# Patient Record
Sex: Male | Born: 1935 | Race: White | Hispanic: No | Marital: Married | Smoking: Never smoker
Health system: Southern US, Community
[De-identification: ages and names within clinical notes are randomized; demographics above are authoritative.]

## PROBLEM LIST (undated history)

## (undated) DIAGNOSIS — Z955 Presence of coronary angioplasty implant and graft: Secondary | ICD-10-CM

## (undated) DIAGNOSIS — I639 Cerebral infarction, unspecified: Secondary | ICD-10-CM

## (undated) DIAGNOSIS — I4891 Unspecified atrial fibrillation: Secondary | ICD-10-CM

## (undated) DIAGNOSIS — I10 Essential (primary) hypertension: Secondary | ICD-10-CM

## (undated) DIAGNOSIS — N189 Chronic kidney disease, unspecified: Secondary | ICD-10-CM

## (undated) DIAGNOSIS — N400 Enlarged prostate without lower urinary tract symptoms: Secondary | ICD-10-CM

## (undated) HISTORY — PX: COLECTOMY: SHX59

## (undated) HISTORY — DX: Cerebral infarction, unspecified: I63.9

---

## 2004-05-08 HISTORY — PX: COLECTOMY: SHX59

## 2019-09-04 ENCOUNTER — Encounter: Payer: Self-pay | Admitting: Speech Pathology

## 2019-09-04 LAB — BASIC METABOLIC PANEL
BUN: 37 — AB (ref 4–21)
CO2: 26 — AB (ref 13–22)
Chloride: 110 — AB (ref 99–108)
Creatinine: 2.4 — AB (ref 0.6–1.3)
Glucose: 125
Potassium: 4 (ref 3.4–5.3)
Sodium: 141 (ref 137–147)

## 2019-09-04 LAB — CBC AND DIFFERENTIAL
HCT: 37 — AB (ref 41–53)
Hemoglobin: 11.8 — AB (ref 13.5–17.5)
Platelets: 125 — AB (ref 150–399)
WBC: 5

## 2019-09-04 LAB — HEPATIC FUNCTION PANEL
ALT: 12 (ref 10–40)
AST: 9 — AB (ref 14–40)
Bilirubin, Direct: 0.2 (ref 0.01–0.4)
Bilirubin, Total: 0.6

## 2019-09-04 LAB — COMPREHENSIVE METABOLIC PANEL
Albumin: 2.4 — AB (ref 3.5–5.0)
Calcium: 8.4 — AB (ref 8.7–10.7)

## 2019-09-04 LAB — CBC: RBC: 3.9 (ref 3.87–5.11)

## 2019-09-04 LAB — VITAMIN D 25 HYDROXY (VIT D DEFICIENCY, FRACTURES): Vit D, 25-Hydroxy: 63.99

## 2019-09-08 ENCOUNTER — Other Ambulatory Visit: Payer: Self-pay

## 2019-09-08 ENCOUNTER — Ambulatory Visit: Payer: No Typology Code available for payment source | Attending: Physician Assistant

## 2019-09-08 DIAGNOSIS — R2689 Other abnormalities of gait and mobility: Secondary | ICD-10-CM | POA: Diagnosis present

## 2019-09-08 DIAGNOSIS — R209 Unspecified disturbances of skin sensation: Secondary | ICD-10-CM | POA: Diagnosis present

## 2019-09-08 DIAGNOSIS — R471 Dysarthria and anarthria: Secondary | ICD-10-CM | POA: Diagnosis present

## 2019-09-08 DIAGNOSIS — R414 Neurologic neglect syndrome: Secondary | ICD-10-CM | POA: Insufficient documentation

## 2019-09-08 DIAGNOSIS — R41841 Cognitive communication deficit: Secondary | ICD-10-CM | POA: Diagnosis present

## 2019-09-08 DIAGNOSIS — M25611 Stiffness of right shoulder, not elsewhere classified: Secondary | ICD-10-CM | POA: Insufficient documentation

## 2019-09-08 DIAGNOSIS — I69351 Hemiplegia and hemiparesis following cerebral infarction affecting right dominant side: Secondary | ICD-10-CM | POA: Insufficient documentation

## 2019-09-08 DIAGNOSIS — R1310 Dysphagia, unspecified: Secondary | ICD-10-CM | POA: Insufficient documentation

## 2019-09-08 DIAGNOSIS — M6281 Muscle weakness (generalized): Secondary | ICD-10-CM | POA: Insufficient documentation

## 2019-09-08 DIAGNOSIS — R41840 Attention and concentration deficit: Secondary | ICD-10-CM | POA: Diagnosis present

## 2019-09-08 DIAGNOSIS — R2681 Unsteadiness on feet: Secondary | ICD-10-CM | POA: Insufficient documentation

## 2019-09-08 DIAGNOSIS — R600 Localized edema: Secondary | ICD-10-CM | POA: Insufficient documentation

## 2019-09-08 DIAGNOSIS — R4701 Aphasia: Secondary | ICD-10-CM | POA: Diagnosis present

## 2019-09-08 NOTE — Therapy (Signed)
[facility] [address] [place], [place], [address] Phone: [phone]   Fax:  [phone]  Physical Therapy Treatment  Patient Details  Name: Robert Mcconnell MRN: [id number] Date of Birth: [birth year] Referring Provider (PT): Arnell Sieving   Encounter Date: 09/08/2019    Past Medical History:  Diagnosis Date  . Stroke [facility])     History reviewed. No pertinent surgical history.  There were no vitals filed for this visit.  Subjective Assessment - 09/08/19 1811    Subjective  CVA 06/29/19 fell on floor while getting out of bed at 6 am in the morning. Called out for his wife. She called 911.  In hospital for 9 days. He went to daughter to [facility]. Has aide 5 days a week for 8 hours. 8 am to 4:40pm. Change him, feed him, bed baths. Has shower chair, walk in shower. Has no AD at home. Had home PT last 2 weeks ago. Has hospital bed and air mattress at home.    Patient is accompained by:  Family member   Daughter Chief Operating Officer)   Pertinent History  CVA    Limitations  Standing;Walking;House hold activities;Sitting    How long can you sit comfortably?  1-2 hours    How long can you stand comfortably?  unable    How long can you walk comfortably?  unable    Patient Stated Goals  walk    Currently in Pain?  No/denies         [facility] PT Assessment - 09/08/19 0001      Assessment   Medical Diagnosis  CVA    Referring Provider (PT)  Arnell Sieving    Onset Date/Surgical Date  06/29/19    Hand Dominance  Right    Prior Therapy  home pT      Precautions   Precautions  Fall      Restrictions   Weight Bearing Restrictions  No      Balance Screen   Has the patient fallen in the past 6 months  No      [place] residence    Living Arrangements  Children;Spouse/significant other    Available Help at Discharge  Family    Type of [place] Access  --      Prior Function    Level of [place]  Retired    Leisure  Management consultant   Overall Cognitive Status  Impaired/Different from baseline    Area of Impairment  JFK Recovery Scale      ROM / Strength   AROM / PROM / Strength  Strength      Strength   Overall Strength  Other (comment)   0/5 in ankle DF, PF, hip flexors, 1+/5 in knee ext, hip flex     Transfers   Transfers  Sit to Stand;Stand to Sit;Squat Pivot Transfers    Sit to Stand  1: +1 Total assist    Sit to Stand Details  Tactile cues for weight shifting;Tactile cues for weight beaing;Tactile cues for initiation;Tactile cues for posture;Verbal cues for technique;Visual cues/gestures for precautions/safety;Visual cues/gestures for sequencing    Stand to Sit  2: Max assist    Stand to Sit Details (indicate cue type and reason)  Tactile cues for sequencing;Tactile cues for initiation;Tactile cues for weight shifting;Tactile cues for posture;Tactile cues for placement;Tactile cues for weight beaing;Visual  cues/gestures for sequencing;Verbal cues for technique;Visual cues/gestures for precautions/safety    Squat Pivot Transfers  1: +1 Total assist      Ambulation/Gait   Ambulation/Gait  No      Standardized Balance Assessment   Standardized Balance Assessment  Berg Balance Test      Berg Balance Test   Sit to Stand  Needs moderate or maximal assist to stand    Standing Unsupported  Unable to stand 30 seconds unassisted    Sitting with Back Unsupported but Feet Supported on Floor or Stool  Able to sit safely and securely 2 minutes    Stand to Sit  Needs assistance to sit    Transfers  Needs two people to assist of supervise to be safe    Standing Unsupported with Eyes Closed  Needs help to keep from falling    Standing Unsupported with Feet Together  Needs help to attain position and unable to hold for 15 seconds    From Standing, Reach Forward with Outstretched Arm  Loses balance while trying/requires  external support    From Standing Position, Pick up Object from Floor  Unable to try/needs assist to keep balance    From Standing Position, Turn to Look Behind Over each Shoulder  Needs assist to keep from losing balance and falling    Turn 360 Degrees  Needs assistance while turning    Standing Unsupported, Alternately Place Feet on Step/Stool  Needs assistance to keep from falling or unable to try    Standing Unsupported, One Foot in [employer] balance while stepping or standing    Standing on One Leg  Unable to try or needs assist to prevent fall    Total Score  4             Treatment Sit to stand: 5x with max A Standing with max A for 15 sec, 30 sec, 1 min, 2 min Squat pivot transfer: total A x 1              PT Education - 09/08/19 1825    Education Details  Daughter educated on continuing to work on transfers, sitting more upright    Person(s) Educated  Other (comment)   daughter   Methods  Demonstration;Explanation    Comprehension  Verbal cues required;Tactile cues required;Need further instruction       PT Short Term Goals - 09/08/19 1826      PT SHORT TERM GOAL #1   Title  Patient will be able to perform sit to stand with mod A with or without AD to improve independence and reduce care giver burden    Baseline  max A    Time  4    Period  Weeks    Status  New    Target Date  10/06/19      PT SHORT TERM GOAL #2   Title  pt will be able to stand with mod A for 5 min to perform small reaching with L UE to improve standing balance.    Baseline  requires max A for standing, unable to reach    Time  4    Period  Weeks    Status  New    Target Date  10/06/19      PT SHORT TERM GOAL #3   Title  Pt will be able to propel chair with SBA  for 30 feet to improve household navigation    Baseline  total A with wheelchair propulsion  Time  4    Period  Weeks    Status  New    Target Date  10/06/19        PT Long Term Goals - 09/08/19 1829       PT LONG TERM GOAL #1   Title  Patient will be able to stand for 5 min with CGA to improve standing endurance with or without AD    Baseline  requires max A    Time  8    Period  Weeks    Status  New    Target Date  11/03/19      PT LONG TERM GOAL #2   Title  Pt will require CGA with sit to stand and chair to chair transfers to improve indepdnence and reduce caregiver burden.    Baseline  max to total A    Time  8    Period  Weeks    Status  New      PT LONG TERM GOAL #3   Title  Patient will be able to walk into walk-in shower and sit on shower chair with CGA to improve independence.    Baseline  bed sponge bath only    Time  8    Period  Weeks    Status  New    Target Date  11/03/19      PT LONG TERM GOAL #4   Title  Pt will demo >10/56 on BBS to improve static balance and reduce fall risk    Baseline  BBS 4/56 (eval)    Time  8    Period  Weeks    Status  New    Target Date  11/03/19            Plan - 09/08/19 1832    Clinical Impression Statement  Patient is a 86 y.o.       Patient will benefit from skilled therapeutic intervention in order to improve the following deficits and impairments:     Visit Diagnosis: Hemiplegia and hemiparesis following cerebral infarction affecting right dominant side (HCC)  Other abnormalities of gait and mobility  Unsteadiness on feet     Problem List There are no problems to display for this patient.   Kerrie Pleasure 09/08/2019, 6:33 PM  [facility] [address] [place], [place], [address] Phone: [phone]   Fax:  [phone]  Name: Robert Mcconnell MRN: [id number] Date of Birth: 12/27/1921

## 2019-09-09 ENCOUNTER — Encounter: Payer: Self-pay | Admitting: Family Medicine

## 2019-09-09 ENCOUNTER — Ambulatory Visit (INDEPENDENT_AMBULATORY_CARE_PROVIDER_SITE_OTHER): Payer: Medicare Other | Admitting: Family Medicine

## 2019-09-09 VITALS — BP 127/80 | HR 44 | Wt 176.0 lb

## 2019-09-09 DIAGNOSIS — K5909 Other constipation: Secondary | ICD-10-CM

## 2019-09-09 DIAGNOSIS — Z86718 Personal history of other venous thrombosis and embolism: Secondary | ICD-10-CM | POA: Diagnosis not present

## 2019-09-09 DIAGNOSIS — I2510 Atherosclerotic heart disease of native coronary artery without angina pectoris: Secondary | ICD-10-CM

## 2019-09-09 DIAGNOSIS — Z8673 Personal history of transient ischemic attack (TIA), and cerebral infarction without residual deficits: Secondary | ICD-10-CM | POA: Diagnosis not present

## 2019-09-09 DIAGNOSIS — R29898 Other symptoms and signs involving the musculoskeletal system: Secondary | ICD-10-CM

## 2019-09-09 DIAGNOSIS — Z85048 Personal history of other malignant neoplasm of rectum, rectosigmoid junction, and anus: Secondary | ICD-10-CM

## 2019-09-09 DIAGNOSIS — F482 Pseudobulbar affect: Secondary | ICD-10-CM | POA: Insufficient documentation

## 2019-09-09 DIAGNOSIS — L989 Disorder of the skin and subcutaneous tissue, unspecified: Secondary | ICD-10-CM

## 2019-09-09 DIAGNOSIS — G510 Bell's palsy: Secondary | ICD-10-CM

## 2019-09-09 DIAGNOSIS — G1229 Other motor neuron disease: Secondary | ICD-10-CM

## 2019-09-09 NOTE — Assessment & Plan Note (Signed)
Actually witnessed a couple episodes of these tearful outburst in the room.  Daughter also reports that he will have outbursts of laughter which seem occasionally inappropriate.  So we discussed that I think he may have a condition called pseudobulbar palsy and there is medication treatment options available did encourage them to discuss it with his [facility] doctor.  I think it may be worthwhile looking into at least may be considering a trial of medication and nasal family can decide if they feel like it is helpful or not.

## 2019-09-09 NOTE — Assessment & Plan Note (Signed)
He has a good bowel regimen in place.

## 2019-09-09 NOTE — Progress Notes (Signed)
New Patient Office Visit  Subjective:  Patient ID: Robert Mcconnell, male    DOB: 1921/08/10  Age: 86 y.o. MRN: [id number]  CC:  Chief Complaint  Patient presents with  . Establish Care    HPI Robert Mcconnell presents for establishment of care.  He is followed by the [facility] so gets most of his medications etc. there.  Unfortunately he recently had a stroke in February and has residual right-sided weakness in the leg and arm as well as some facial droop and weakness on the right side of his face he is also had some speech problems since then.  He does have a prior history of coronary artery disease and had a heart attack in his late 73s he is also had prior history of DVTs particularly in the right lower leg.  He had been self adjusting his warfarin because of some bruising on his arms which she did not like around the time that he had the stroke.  He does have a history of chronic kidney disease I am not sure exactly which stage but his Eliquis is renally dosed.  His daughter pointed out a crusty skin lesion on the right side of his face.  She said she has not noticed it bleeding but it has been there for quite a long time and has not been addressed.  His daughter also noted that he has been more tearful and emotional.  She says it actually started before the stroke but it has worsened since then in fact he was put on Remeron recently to help with the emotionality as well as sleep issues.  Feels like that has gotten better.   Past Medical History:  Diagnosis Date  . Stroke [facility])     Past Surgical History:  Procedure Laterality Date  . COLECTOMY     cancer    History reviewed. No pertinent family history.  Social History   Socioeconomic History  . Marital status: Married    Spouse name: Not on file  . Number of children: Not on file  . Years of education: Not on file  . Highest education level: Not on file  Occupational History  . Not on file  Tobacco Use  . Smoking status: Never  Smoker  . Smokeless tobacco: Never Used  Substance and Sexual Activity  . Alcohol use: Yes  . Drug use: Never  . Sexual activity: Not Currently    Partners: Female  Other Topics Concern  . Not on file  Social History Narrative  . Not on file   Social Determinants of Health   Financial Resource Strain:   . Difficulty of Paying Living Expenses:   Food Insecurity:   . Worried About Charity fundraiser in the Last Year:   . Arboriculturist in the Last Year:   Transportation Needs:   . Film/video editor (Medical):   [name] Kitchen Lack of Transportation (Non-Medical):   Physical Activity:   . Days of Exercise per Week:   . Minutes of Exercise per Session:   Stress:   . Feeling of Stress :   Social Connections:   . Frequency of Communication with Friends and Family:   . Frequency of Social Gatherings with Friends and Family:   . Attends Religious Services:   . Active Member of Clubs or Organizations:   . Attends Archivist Meetings:   [name] Kitchen Marital Status:   Intimate Partner Violence:   . Fear of Current or Ex-Partner:   .  Emotionally Abused:   [name] Kitchen Physically Abused:   . Sexually Abused:     ROS Review of Systems  Constitutional: Negative for diaphoresis, fever and unexpected weight change.  HENT: Negative for hearing loss, rhinorrhea, sneezing and tinnitus.   Eyes: Negative for visual disturbance.  Respiratory: Negative for cough and wheezing.   Cardiovascular: Negative for chest pain and palpitations.  Gastrointestinal: Negative for blood in stool, diarrhea, nausea and vomiting.  Genitourinary: Negative for discharge and dysuria.  Musculoskeletal: Negative for arthralgias and myalgias.  Skin: Negative for rash.       Skin lesion on face  Neurological: Positive for facial asymmetry, speech difficulty and weakness. Negative for headaches.  Hematological: Negative for adenopathy.  Psychiatric/Behavioral: Negative for dysphoric mood and sleep disturbance. The patient is not  nervous/anxious.        Mood swings    Objective:   Today's Vitals: BP 127/80   Pulse (!) 44   Wt 176 lb (79.8 kg)   SpO2 98%   Physical Exam Vitals and nursing note reviewed.  Constitutional:      Appearance: He is well-developed.     Comments: Also noted that he was having very small outburst of being tearful that would last just maybe 5 to 10 seconds.  HENT:     Head: Normocephalic and atraumatic.  Cardiovascular:     Rate and Rhythm: Normal rate. Rhythm irregular.     Heart sounds: Normal heart sounds.  Pulmonary:     Effort: Pulmonary effort is normal.     Breath sounds: Normal breath sounds.  Skin:    General: Skin is warm and dry.     Comments: On the right facial cheek there is a fairly large approximately 1-1/2 cm crusted thick lesion with surrounding erythema.  Neurological:     Mental Status: He is alert and oriented to person, place, and time.  Psychiatric:        Behavior: Behavior normal.     Assessment & Plan:   Problem List Items Addressed This Visit      Cardiovascular and Mediastinum   CAD (coronary artery disease)    Ankle stent placed in his late 25s.  Takes a daily statin and tolerates that well.  Blood pressure is beautiful today.      Relevant Medications   terazosin (HYTRIN) 5 MG capsule   apixaban (ELIQUIS) 2.5 MG TABS tablet   atorvastatin (LIPITOR) 40 MG tablet     Digestive   Chronic constipation    He has a good bowel regimen in place.      Relevant Medications   Docusate Sodium 150 MG/15ML syrup   senna (SENOKOT) 8.6 MG TABS tablet     Nervous and Auditory   Right leg weakness    Already engaged in formal physical therapy through the neuro rehab department at [facility].  His daughter says that she is already noticed some significant improvement and is hopeful to get him even stronger and may be even able to ambulate some on his own.      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  Daughter also reports that he will have outbursts of laughter which seem occasionally inappropriate.  So we discussed that I think he may have a condition called pseudobulbar palsy and there is medication treatment options available did encourage them to discuss it with his [facility] doctor.  I think it may  be worthwhile looking into at least may be considering a trial of medication and nasal family can decide if they feel like it is helpful or not.      Facial paralysis on right side   Relevant Medications   mirtazapine (REMERON) 15 MG tablet     Other   Personal history of DVT (deep vein thrombosis)   History of ischemic stroke   History of colorectal cancer    Other Visit Diagnoses    Skin lesion of cheek    -  Primary   Relevant Orders   Ambulatory referral to Dermatology      Skin lesion on his right facial cheek is most consistent with a squamous cell skin cancer some to refer him to dermatology I think he would probably benefit from Mohs surgery.  Based on the size.   Outpatient Encounter Medications as of 09/09/2019  Medication Sig  . apixaban (ELIQUIS) 2.5 MG TABS tablet Take by mouth 2 (two) times daily.  [name] Kitchen atorvastatin (LIPITOR) 40 MG tablet Take 40 mg by mouth in the morning and at bedtime.  . calcitRIOL (ROCALTROL) 0.25 MCG capsule Take 2.25 mcg by mouth every Monday, Wednesday, and Friday. 2.25 mcg M/W/F  . Docusate Sodium 150 MG/15ML syrup Take 100 mg by mouth daily.  . famotidine (PEPCID) 40 MG/5ML suspension Take 20 mg by mouth daily.  . finasteride (PROSCAR) 5 MG tablet Take 5 mg by mouth daily.  . mirtazapine (REMERON) 15 MG tablet Take 15 mg by mouth at bedtime.  . senna (SENOKOT) 8.6 MG TABS tablet Take 1 tablet by mouth daily.  [name] Kitchen terazosin (HYTRIN) 5 MG capsule Take 5 mg by mouth at bedtime.   No facility-administered encounter medications on file as of 09/09/2019.    Follow-up: Return in about 1 year (around 09/08/2020).    Beatrice Lecher, MD

## 2019-09-09 NOTE — Assessment & Plan Note (Addendum)
Ankle stent placed in his late 47s.  Takes a daily statin and tolerates that well.  Blood pressure is beautiful today.

## 2019-09-09 NOTE — Assessment & Plan Note (Signed)
Already engaged in formal physical therapy through the neuro rehab department at [facility].  His daughter says that she is already noticed some significant improvement and is hopeful to get him even stronger and may be even able to ambulate some on his own.

## 2019-09-10 ENCOUNTER — Encounter: Payer: Self-pay | Admitting: Family Medicine

## 2019-09-11 ENCOUNTER — Ambulatory Visit: Payer: No Typology Code available for payment source

## 2019-09-11 ENCOUNTER — Other Ambulatory Visit: Payer: Self-pay

## 2019-09-11 DIAGNOSIS — I69351 Hemiplegia and hemiparesis following cerebral infarction affecting right dominant side: Secondary | ICD-10-CM | POA: Diagnosis not present

## 2019-09-11 DIAGNOSIS — R2689 Other abnormalities of gait and mobility: Secondary | ICD-10-CM

## 2019-09-11 DIAGNOSIS — R2681 Unsteadiness on feet: Secondary | ICD-10-CM

## 2019-09-11 NOTE — Therapy (Signed)
[facility] [address] [place], [place], [address] Phone: [phone]   Fax:  [phone]  Physical Therapy Treatment  Patient Details  Name: Robert Mcconnell MRN: [id number] Date of Birth: 1921/11/08 Referring Provider (PT): Arnell Sieving   Encounter Date: 09/11/2019    Past Medical History:  Diagnosis Date  . Stroke [facility])     Past Surgical History:  Procedure Laterality Date  . COLECTOMY     cancer    There were no vitals filed for this visit.  Subjective Assessment - 09/11/19 1359    Subjective  No new complaints    Patient is accompained by:  Family member   Daughter Chief Operating Officer)   Pertinent History  CVA    Limitations  Standing;Walking;House hold activities;Sitting    How long can you sit comfortably?  1-2 hours    How long can you stand comfortably?  unable    How long can you walk comfortably?  unable    Patient Stated Goals  walk               Treatment: Sit to stand with max A (verbal and tactile cues to lock knees and stand up straight): 1st bout: 2' with lateral weight shifts; 2nd bout: 2' with lateral weight shifts  Chair to mat transfers: x2, max A x 1  Seated dynamic balance: sitting unsupported at edge of mat and reaching forward and forward to Right in multiple directions  Sitting to R elbow and pushing off to come back to sitting: 10x each side  Sitting trunk twists to look over to both sides: 10x Sitting forward lean and coming back up: tactile cues at pelvis to reduce excessive lean to R when coming back up Sitting eccentric trunk extension: 10x    Wheelchair mobility: total A with moving L LE, pt able to puse R LE to pull the wheelchair forward: 115'  Use of L UE only to navigate 115' with min A  Use of L UE and L LE with SBA: 100'             PT Education - 09/11/19 1400    Education Details  Pt presents with son in Sports coach. No new complaitns.     Person(s) Educated  Associate Professor)   son in Sports coach Virgia Land)   Methods  Explanation    Comprehension  Verbalized understanding       PT Short Term Goals - 09/08/19 1826      PT SHORT TERM GOAL #1   Title  Patient will be able to perform sit to stand with mod A with or without AD to improve independence and reduce care giver burden    Baseline  max A    Time  4    Period  Weeks    Status  New    Target Date  10/06/19      PT SHORT TERM GOAL #2   Title  pt will be able to stand with mod A for 5 min to perform small reaching with L UE to improve standing balance.    Baseline  requires max A for standing, unable to reach    Time  4    Period  Weeks    Status  New    Target Date  10/06/19      PT SHORT TERM GOAL #3   Title  Pt will be able to propel chair with SBA  for 30 feet to improve household navigation  Baseline  total A with wheelchair propulsion    Time  4    Period  Weeks    Status  New    Target Date  10/06/19        PT Long Term Goals - 09/08/19 1829      PT LONG TERM GOAL #1   Title  Patient will be able to stand for 5 min with CGA to improve standing endurance with or without AD    Baseline  requires max A    Time  8    Period  Weeks    Status  New    Target Date  11/03/19      PT LONG TERM GOAL #2   Title  Pt will require CGA with sit to stand and chair to chair transfers to improve indepdnence and reduce caregiver burden.    Baseline  max to total A    Time  8    Period  Weeks    Status  New      PT LONG TERM GOAL #3   Title  Patient will be able to walk into walk-in shower and sit on shower chair with CGA to improve independence.    Baseline  bed sponge bath only    Time  8    Period  Weeks    Status  New    Target Date  11/03/19      PT LONG TERM GOAL #4   Title  Pt will demo >10/56 on BBS to improve static balance and reduce fall risk    Baseline  BBS 4/56 (eval)    Time  8    Period  Weeks    Status  New    Target Date  11/03/19               Patient will benefit from skilled therapeutic intervention in order to improve the following deficits and impairments:     Visit Diagnosis: Hemiplegia and hemiparesis following cerebral infarction affecting right dominant side ([place])  Other abnormalities of gait and mobility  Unsteadiness on feet     Problem List Patient Active Problem List   Diagnosis Date Noted  . Personal history of DVT (deep vein thrombosis) 09/09/2019  . History of ischemic stroke 09/09/2019  . CAD (coronary artery disease) 09/09/2019  . Chronic constipation 09/09/2019  . History of colorectal cancer 09/09/2019  . 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 Right arm weakness 09/09/2019  . Right leg weakness 09/09/2019  . 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

## 2019-09-12 ENCOUNTER — Ambulatory Visit: Payer: No Typology Code available for payment source

## 2019-09-12 DIAGNOSIS — R1310 Dysphagia, unspecified: Secondary | ICD-10-CM

## 2019-09-12 DIAGNOSIS — R4701 Aphasia: Secondary | ICD-10-CM

## 2019-09-12 DIAGNOSIS — R41841 Cognitive communication deficit: Secondary | ICD-10-CM

## 2019-09-12 DIAGNOSIS — R471 Dysarthria and anarthria: Secondary | ICD-10-CM

## 2019-09-12 DIAGNOSIS — I69351 Hemiplegia and hemiparesis following cerebral infarction affecting right dominant side: Secondary | ICD-10-CM | POA: Diagnosis not present

## 2019-09-12 NOTE — Therapy (Signed)
[facility] [address] [place], [place], [address] Phone: [phone]   Fax:  [phone]  Speech Language Pathology Evaluation  Patient Details  Name: Robert Mcconnell MRN: [id number] Date of Birth: 1921/07/16 Referring Provider (SLP): Wynema Birch, [place]   Encounter Date: 09/12/2019  End of Session - 09/12/19 1646    Visit Number  1    Number of Visits  15    Date for SLP Re-Evaluation  12/05/19    Authorization Type  [facility]    Authorization - Visit Number  1    Authorization - Number of Visits  15    SLP Start Time  8768    SLP Stop Time   1157    SLP Time Calculation (min)  41 min    Activity Tolerance  Patient tolerated treatment well       Past Medical History:  Diagnosis Date  . Stroke [facility])     Past Surgical History:  Procedure Laterality Date  . COLECTOMY     cancer    There were no vitals filed for this visit.      SLP Evaluation [facility] - 09/12/19 1457      SLP Visit Information   SLP Received On  09/12/19    Referring Provider (SLP)  Wynema Birch, PA-C    Onset Date  February 2021    Medical Diagnosis  CVA      Subjective   Patient/Family Stated Goal  Improve abilities with speech and thinking.      General Information   HPI  Little case history known due to scant medical history in pt's chart. Pt moved to area two weeks ago so that daughter and son in law could care for pt and his wife. Pt with CVA in February 2021. Pt was independent prior to CVA, driving and financial management according to son in law who accompanied pt today.       Prior Functional Status   Cognitive/Linguistic Baseline  Within functional limits    Type of Home  House     Lives With  Family    Available Support  Family      Cognition   Overall Cognitive Status  Impaired/Different from baseline    Area of Impairment  Attention;Orientation;Memory;Following commands;Safety/judgement    Orientation Level  Place     General Comments  Pt got "office" with extra time    Current Attention Level  Sustained    Attention Comments  Missed 2 "A"s on letter ID, described one item on similarity/reasoning task without commenting on the other one at all but said "measure" within 2 seconds when SLP asked the question a second time ("What is similar about a ruler and a watch?")    Memory  Decreased short-term memory;Decreased recall of precautions    Memory Comments  suspect due to significantly decr'd attention, pt recalled 3 words beginning wiht "f" but also included photography and a proper nown    Attention  Sustained    Sustained Attention  Impaired    Behaviors  Lability   pt has pseudobulbar palsy diagnosed post-CVA     Auditory Comprehension   Overall Auditory Comprehension  Impaired   suspected; possibly due to decr'd attention   Interfering Components  Hearing   SLP took off mask (sat next to door), and used incr'd volume   EffectiveTechniques  Extra processing time;Slowed speech   writing on note pad      Expression   Primary Mode of  Expression  Verbal      Verbal Expression   Overall Verbal Expression  Impaired   difficult to assess due to decr'd cognition/dysarthria     Oral Motor/Sensory Function   Overall Oral Motor/Sensory Function  Impaired    Labial ROM  Reduced right    Labial Strength  Reduced    Lingual Strength  Within Functional Limits    Facial Symmetry  Right droop      Motor Speech   Overall Motor Speech  Impaired    Articulation  Impaired    Level of Impairment  Word    Intelligibility  Intelligibility reduced    Word  50-74% accurate    Phrase  25-49% accurate    Sentence  25-49% accurate    Conversation  Not tested    Motor Planning  Witnin functional limits    Effective Techniques  Slow rate;Over-articulate      Standardized Assessments   Standardized Assessments   Montreal Cognitive Assessment (MOCA)   -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)                      SLP Education - 09/12/19 1645    Education Details  possible target areas for cognitive lingistics    Person(s) Educated  Patient;Child(ren)    Methods  Explanation;Verbal cues;Other (comment)   written cues   Comprehension  Verbalized understanding;Need further instruction       SLP Short Term Goals - 09/12/19 1650      SLP SHORT TERM GOAL #1   Title  pt will demo sustained attention for a 5- minute familiar task x5, in 3 sessions    Time  4    Period  Weeks    Status  New      SLP SHORT TERM GOAL #2   Title  pt will demo knowledge of a memory book when asked pertinent questions dealing with memory in 3 sessions    Time  4    Period  Weeks    Status  New       SLP Long Term Goals - 09/12/19 1655      SLP LONG TERM GOAL #1   Title  pt will demo sustained attention for 3 10 minute therapy tasks in 3 sessions    Time  7    Period  Weeks   or 15 total visits, for all LTGs   Status  New      SLP LONG TERM GOAL #2   Title  pt will engage with memory notebook/planner, WNL procedure, with min nonverbal cues to do so, in 3 sessions    Time  7    Period  Weeks    Status  New       Plan - 09/12/19 1647    Clinical Impression Statement  Pt presents with significant deficits in cognitive linguistics incluing attention, memory, awareness. Moderate dysarthria present. Pt with likely expressive (and probbly receptive) aphasia. SLP unsure about pt's swallow status currently, will inquire  next visit. Pt may require objective swallow eval during this therapy course. Pt scored 2/22 on the [facility] Cognitive Assessment - Blind (due to pt dominant hand flaccid and pt unable to write). Pt with multi-factorial sx making improvement in speech and language difficult but SLP to begin with attention tasks as therapy efficacy is dependent upon pt maintaining attention to a degree that his skils can be positively affected by ST. Pt may be able to benefit from  skilled  ST - if pt skills remain almost identical to eval pt may be discharged and re-evaled when pt attention becomes more functional for speech therapy setting.    Speech Therapy Frequency  2x / week    Duration  --   7 weeks (or 15 visits - VA auth)   Treatment/Interventions  Environmental controls;Oral motor exercises;Functional tasks;Multimodal communcation approach;Cueing hierarchy;Trials of upgraded texture/liquids;Diet toleration management by SLP;SLP instruction and feedback;Language facilitation;Cognitive reorganization;Compensatory strategies;Patient/family education;Internal/external aids    Potential to Achieve Goals  Fair    Potential Considerations  Severity of impairments    Consulted and Agree with Plan of Care  Patient;Family member/caregiver    Family Member Consulted  son in law       Patient will benefit from skilled therapeutic intervention in order to improve the following deficits and impairments:   Cognitive communication deficit  Aphasia  Dysarthria and anarthria  Dysphagia, unspecified type    Problem List Patient Active Problem List   Diagnosis Date Noted  . Personal history of DVT (deep vein thrombosis) 09/09/2019  . History of ischemic stroke 09/09/2019  . CAD (coronary artery disease) 09/09/2019  . Chronic constipation 09/09/2019  . History of colorectal cancer 09/09/2019  . 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 Right arm weakness 09/09/2019  . Right leg weakness 09/09/2019  . 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

## 2019-09-15 ENCOUNTER — Other Ambulatory Visit: Payer: Self-pay

## 2019-09-15 ENCOUNTER — Ambulatory Visit: Payer: No Typology Code available for payment source

## 2019-09-15 DIAGNOSIS — I69351 Hemiplegia and hemiparesis following cerebral infarction affecting right dominant side: Secondary | ICD-10-CM | POA: Diagnosis not present

## 2019-09-15 DIAGNOSIS — R2689 Other abnormalities of gait and mobility: Secondary | ICD-10-CM

## 2019-09-15 DIAGNOSIS — R2681 Unsteadiness on feet: Secondary | ICD-10-CM

## 2019-09-15 NOTE — Therapy (Addendum)
[facility] [address] [place], [place], [address] Phone: [phone]   Fax:  [phone]  Physical Therapy Treatment  Patient Details  Name: Robert Mcconnell MRN: [id number] Date of Birth: Jul 23, 1921 Referring Provider (PT): Arnell Sieving   Encounter Date: 09/15/2019  PT End of Session - 09/15/19 1735    Visit Number  3    Number of Visits  17    Date for PT Re-Evaluation  11/03/19    Authorization Type  [facility]    PT Start Time  1615    PT Stop Time  1705    PT Time Calculation (min)  50 min    Equipment Utilized During Treatment  Gait belt    Activity Tolerance  Patient tolerated treatment well    Behavior During Therapy  [facility] for tasks assessed/performed       Past Medical History:  Diagnosis Date  . Stroke [facility])     Past Surgical History:  Procedure Laterality Date  . COLECTOMY     cancer    There were no vitals filed for this visit.  Subjective Assessment - 09/15/19 1726    Subjective  Daughter reports he is doing better with standing at home. He can stand little longer. He can use pedal bicylce for 30 min a day.    Patient is accompained by:  Family member   Daughter and son in law   Pertinent History  CVA    Limitations  Standing;Walking;House hold activities;Sitting    How long can you sit comfortably?  1-2 hours    How long can you stand comfortably?  unable    How long can you walk comfortably?  unable    Patient Stated Goals  walk    Currently in Pain?  No/denies           Chair to [employer] transfer: max a x 1 stand pivot Nustep: level 1 for 10', R Foot strapped in pedals, PT holding on to R hand on handlebar manually Nustep to chair transfer: max A x 1, pt did not take any steps today, stand pivot transfer Wheel chair mobility: 1 x 10 feet with L UE and bil LE, total A with advancing R LE forward Wheel chair mobility: 1 x 20 feet with R UE only: approximation cues given at the  knee, pt sitting without back support and leaning forward to engage core, approximation cues given at R shoulder for coreengagement  Sit to stand in parallel bars: max A, pt able ot use L UE better using parallel bar, daughter holding R UE on parallel bar with tota A, PT blocking bil knees and providing mod to max A to stand up. Maximum tactile cues given to shift weight, stand up straight and push knees back: total 7x  Gait training: advanced 2 steps, 2 steps, 4 steps with max A with max cues for weigh shifting, total A with advancing R LE, manually blocked contralateral knee and hip during swing phase.                       PT Short Term Goals - 09/08/19 1826      PT SHORT TERM GOAL #1   Title  Patient will be able to perform sit to stand with mod A with or without AD to improve independence and reduce care giver burden    Baseline  max A    Time  4    Period  Weeks  Status  New    Target Date  10/06/19      PT SHORT TERM GOAL #2   Title  pt will be able to stand with mod A for 5 min to perform small reaching with L UE to improve standing balance.    Baseline  requires max A for standing, unable to reach    Time  4    Period  Weeks    Status  New    Target Date  10/06/19      PT SHORT TERM GOAL #3   Title  Pt will be able to propel chair with SBA  for 30 feet to improve household navigation    Baseline  total A with wheelchair propulsion    Time  4    Period  Weeks    Status  New    Target Date  10/06/19        PT Long Term Goals - 09/08/19 1829      PT LONG TERM GOAL #1   Title  Patient will be able to stand for 5 min with CGA to improve standing endurance with or without AD    Baseline  requires max A    Time  8    Period  Weeks    Status  New    Target Date  11/03/19      PT LONG TERM GOAL #2   Title  Pt will require CGA with sit to stand and chair to chair transfers to improve indepdnence and reduce caregiver burden.    Baseline  max to  total A    Time  8    Period  Weeks    Status  New      PT LONG TERM GOAL #3   Title  Patient will be able to walk into walk-in shower and sit on shower chair with CGA to improve independence.    Baseline  bed sponge bath only    Time  8    Period  Weeks    Status  New    Target Date  11/03/19      PT LONG TERM GOAL #4   Title  Pt will demo >10/56 on BBS to improve static balance and reduce fall risk    Baseline  BBS 4/56 (eval)    Time  8    Period  Weeks    Status  New    Target Date  11/03/19            Plan - 09/15/19 1732    Clinical Impression Statement  Pt demonstrated trace activation of hamstrings with wheelchair scoot exercises in R hamstrings. Pt able to pull himself better using prallel bars. pt able to walk max 2 feet with max A x 2 today    Personal Factors and Comorbidities  Age;Comorbidity 3+    Comorbidities  hx of stroke, hx of colorectal cancer    Examination-Activity Limitations  Bathing;Bed Mobility;Caring for Others;Carry;Locomotion Level;Hygiene/Grooming;Dressing;Continence;Self Feeding;Reach Overhead;Sit;Lift;Other;Transfers;Toileting;Stand;Stairs    Examination-Participation Restrictions  Church;Cleaning;Community Activity;Driving;Meal Prep;Volunteer;Shop;Medication Management;Laundry;Personal Finances;Yard Work;Interpersonal Relationship    Stability/Clinical Decision Making  Unstable/Unpredictable    Clinical Decision Making  High       Patient will benefit from skilled therapeutic intervention in order to improve the following deficits and impairments:  Abnormal gait, Decreased activity tolerance, Decreased coordination, Decreased safety awareness, Decreased strength, Impaired flexibility, Impaired UE functional use, Postural dysfunction, Impaired tone, Increased edema, Decreased range of motion, Decreased knowledge of precautions, Impaired sensation, Difficulty  walking, Decreased mobility, Decreased endurance, Decreased balance  Visit  Diagnosis: Hemiplegia and hemiparesis following cerebral infarction affecting right dominant side (HCC)  Other abnormalities of gait and mobility  Unsteadiness on feet     Problem List Patient Active Problem List   Diagnosis Date Noted  . Personal history of DVT (deep vein thrombosis) 09/09/2019  . History of ischemic stroke 09/09/2019  . CAD (coronary artery disease) 09/09/2019  . Chronic constipation 09/09/2019  . History of colorectal cancer 09/09/2019  . 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 Right arm weakness 09/09/2019  . Right leg weakness 09/09/2019  . 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 [place], [place], [address] Phone: [phone]   Fax:  [phone]  Name: Chasten Blaze MRN: [id number] Date of Birth: 1922-02-06

## 2019-09-18 ENCOUNTER — Ambulatory Visit: Payer: No Typology Code available for payment source

## 2019-09-18 ENCOUNTER — Other Ambulatory Visit: Payer: Self-pay

## 2019-09-18 DIAGNOSIS — I69351 Hemiplegia and hemiparesis following cerebral infarction affecting right dominant side: Secondary | ICD-10-CM | POA: Diagnosis not present

## 2019-09-18 DIAGNOSIS — R2689 Other abnormalities of gait and mobility: Secondary | ICD-10-CM

## 2019-09-18 DIAGNOSIS — R2681 Unsteadiness on feet: Secondary | ICD-10-CM

## 2019-09-18 NOTE — Therapy (Signed)
[facility] [address] [place], [place], [address] Phone: [phone]   Fax:  [phone]  Physical Therapy Treatment  Patient Details  Name: Robert Mcconnell MRN: [id number] Date of Birth: 06/12/1921 Referring Provider (PT): Arnell Sieving   Encounter Date: 09/18/2019  PT End of Session - 09/18/19 1642    Visit Number  4    Number of Visits  17    Date for PT Re-Evaluation  11/03/19    Authorization Type  [facility]    PT Start Time  1530    PT Stop Time  1615    PT Time Calculation (min)  45 min    Equipment Utilized During Treatment  Gait belt    Activity Tolerance  Patient tolerated treatment well    Behavior During Therapy  [facility] for tasks assessed/performed       Past Medical History:  Diagnosis Date  . Stroke [facility])     Past Surgical History:  Procedure Laterality Date  . COLECTOMY     cancer    There were no vitals filed for this visit.  Subjective Assessment - 09/18/19 1638    Subjective  Son in law reports no new changes    Patient is accompained by:  Family member    Limitations  Standing;Walking;House hold activities;Sitting    How long can you sit comfortably?  1-2 hours    How long can you stand comfortably?  unable    How long can you walk comfortably?  unable    Patient Stated Goals  walk           Treatmen: Nustep: level 3 for 8', total A to keep R UE on handle and keep R hip neutral alignment Wheelchair to Nustep transfer: max A able to take 3 steps with stand pivot transfer Nustep to wheelchair transfer: max A unable to take any steps Sit to stand at parallel bar in front: use of Left UE and max A from PT: 5x, pt able to stand for 1' with min to mod A and cues to correct lateral left lean                       PT Short Term Goals - 09/08/19 1826      PT SHORT TERM GOAL #1   Title  Patient will be able to perform sit to stand with mod A with or without AD to  improve independence and reduce care giver burden    Baseline  max A    Time  4    Period  Weeks    Status  New    Target Date  10/06/19      PT SHORT TERM GOAL #2   Title  pt will be able to stand with mod A for 5 min to perform small reaching with L UE to improve standing balance.    Baseline  requires max A for standing, unable to reach    Time  4    Period  Weeks    Status  New    Target Date  10/06/19      PT SHORT TERM GOAL #3   Title  Pt will be able to propel chair with SBA  for 30 feet to improve household navigation    Baseline  total A with wheelchair propulsion    Time  4    Period  Weeks    Status  New    Target Date  10/06/19        PT Long Term Goals - 09/08/19 1829      PT LONG TERM GOAL #1   Title  Patient will be able to stand for 5 min with CGA to improve standing endurance with or without AD    Baseline  requires max A    Time  8    Period  Weeks    Status  New    Target Date  11/03/19      PT LONG TERM GOAL #2   Title  Pt will require CGA with sit to stand and chair to chair transfers to improve indepdnence and reduce caregiver burden.    Baseline  max to total A    Time  8    Period  Weeks    Status  New      PT LONG TERM GOAL #3   Title  Patient will be able to walk into walk-in shower and sit on shower chair with CGA to improve independence.    Baseline  bed sponge bath only    Time  8    Period  Weeks    Status  New    Target Date  11/03/19      PT LONG TERM GOAL #4   Title  Pt will demo >10/56 on BBS to improve static balance and reduce fall risk    Baseline  BBS 4/56 (eval)    Time  8    Period  Weeks    Status  New    Target Date  11/03/19            Plan - 09/18/19 1639    Clinical Impression Statement  Patient demonstrated improved quad contraction with wheelchair push backs using R LE with tactile cues of lower leg approximation at knee. Patient was able to take 3 steps with stand pivot transfer with max A. Patient demo  improved sit to stand using parallel bar in front of him to pull himself. He was abel to stand with holding on to parallel bar and min to mod A with improved WB. Pt had left lean and was able to correct it 50% of the time with verbal cueing.    Personal Factors and Comorbidities  Age;Comorbidity 3+    Comorbidities  hx of stroke, hx of colorectal cancer    Examination-Activity Limitations  Bathing;Bed Mobility;Caring for Others;Carry;Locomotion Level;Hygiene/Grooming;Dressing;Continence;Self Feeding;Reach Overhead;Sit;Lift;Other;Transfers;Toileting;Stand;Stairs    Examination-Participation Restrictions  Church;Cleaning;Community Activity;Driving;Meal Prep;Volunteer;Shop;Medication Management;Laundry;Personal Finances;Yard Work;Interpersonal Relationship    Stability/Clinical Decision Making  Unstable/Unpredictable    Clinical Decision Making  High    Rehab Potential  Good    PT Frequency  2x / week    PT Duration  6 weeks    PT Treatment/Interventions  ADLs/Self Care Home Management;Electrical Stimulation;Gait training;Stair training;Functional mobility training;Therapeutic exercise;Balance training;Neuromuscular re-education;Therapeutic activities;Patient/family education;Wheelchair mobility training;Manual techniques;Passive range of motion    PT Next Visit Plan  continue to work on sit to stand transfers, chair to chair transfers, wheelchair mobility       Patient will benefit from skilled therapeutic intervention in order to improve the following deficits and impairments:  Abnormal gait, Decreased activity tolerance, Decreased coordination, Decreased safety awareness, Decreased strength, Impaired flexibility, Impaired UE functional use, Postural dysfunction, Impaired tone, Increased edema, Decreased range of motion, Decreased knowledge of precautions, Impaired sensation, Difficulty walking, Decreased mobility, Decreased endurance, Decreased balance  Visit Diagnosis: Hemiplegia and hemiparesis  following cerebral infarction affecting right dominant side (HCC)  Other abnormalities of  gait and mobility  Unsteadiness on feet     Problem List Patient Active Problem List   Diagnosis Date Noted  . Personal history of DVT (deep vein thrombosis) 09/09/2019  . History of ischemic stroke 09/09/2019  . CAD (coronary artery disease) 09/09/2019  . Chronic constipation 09/09/2019  . History of colorectal cancer 09/09/2019  . 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 Right arm weakness 09/09/2019  . Right leg weakness 09/09/2019  . 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

## 2019-09-22 ENCOUNTER — Ambulatory Visit: Payer: No Typology Code available for payment source

## 2019-09-22 ENCOUNTER — Other Ambulatory Visit: Payer: Self-pay

## 2019-09-22 DIAGNOSIS — R2689 Other abnormalities of gait and mobility: Secondary | ICD-10-CM

## 2019-09-22 DIAGNOSIS — I69351 Hemiplegia and hemiparesis following cerebral infarction affecting right dominant side: Secondary | ICD-10-CM

## 2019-09-22 DIAGNOSIS — R2681 Unsteadiness on feet: Secondary | ICD-10-CM

## 2019-09-22 NOTE — Therapy (Signed)
[facility] [address] [place], [place], [address] Phone: [phone]   Fax:  [phone]  Physical Therapy Treatment  Patient Details  Name: Robert Mcconnell MRN: [id number] Date of Birth: September 02, 1921 Referring Provider (PT): Arnell Sieving   Encounter Date: 09/22/2019  PT End of Session - 09/22/19 1911    Visit Number  5    Number of Visits  17    Date for PT Re-Evaluation  11/03/19    Authorization Type  [place]    PT Start Time  1700    PT Stop Time  1745    PT Time Calculation (min)  45 min    Equipment Utilized During Treatment  Gait belt    Activity Tolerance  Patient tolerated treatment well    Behavior During Therapy  [facility] for tasks assessed/performed       Past Medical History:  Diagnosis Date  . Stroke [facility])     Past Surgical History:  Procedure Laterality Date  . COLECTOMY     cancer    There were no vitals filed for this visit.  Subjective Assessment - 09/22/19 1909    Subjective  Daughter and son in law present. Patient fell in the parking lot as he slid forward in his wheelchair while trying to get out of the car. no reported injuries. Daughter reports that she got him a shoulder sling which is helping with swelling in his hand.    Patient is accompained by:  Family member    Pertinent History  CVA    Limitations  Standing;Walking;House hold activities;Sitting    How long can you sit comfortably?  1-2 hours    How long can you stand comfortably?  unable    How long can you walk comfortably?  unable    Patient Stated Goals  walk    Currently in Pain?  No/denies               Wheelchair to mat transfer: max A able to take 3 steps with stand pivot transfer Mat to wheelchair transfer: max A unable to take any steps Standing frame: - sit to stand: max A - lateral weight shifts with verbal and tactile cues: 20x - Lateral shift to R and hold: 5 sec x 10 - Standing and reaching with  left hand in multiple directions, worked on reaching to R and forward to improve anterior and R weight shift  Gait training: use of paralle bars: ambulated 3 steps forward x 2 with max A and wheel chair follow, pt needs total A to guard R kneefrom buckling and to advance R leg forward.                      PT Short Term Goals - 09/22/19 1910      PT SHORT TERM GOAL #1   Title  Patient will be able to perform sit to stand with mod A with or without AD to improve independence and reduce care giver burden    Baseline  max A    Time  4    Period  Weeks    Status  New    Target Date  10/06/19      PT SHORT TERM GOAL #2   Title  pt will be able to stand with mod A for 5 min to perform small reaching with L UE to improve standing balance.    Baseline  requires max A for standing, unable to  reach    Time  4    Period  Weeks    Status  New    Target Date  10/06/19      PT SHORT TERM GOAL #3   Title  Pt will be able to propel chair with SBA  for 30 feet to improve household navigation    Baseline  total A with wheelchair propulsion    Time  4    Period  Weeks    Status  New    Target Date  10/06/19        PT Long Term Goals - 09/22/19 1910      PT LONG TERM GOAL #1   Title  Patient will be able to stand for 5 min with CGA to improve standing endurance with or without AD    Baseline  requires max A    Time  8    Period  Weeks    Status  New      PT LONG TERM GOAL #2   Title  Pt will require CGA with sit to stand and chair to chair transfers to improve indepdnence and reduce caregiver burden.    Baseline  max to total A    Time  8    Period  Weeks    Status  New      PT LONG TERM GOAL #3   Title  Patient will be able to walk into walk-in shower and sit on shower chair with CGA to improve independence.    Baseline  bed sponge bath only    Time  8    Period  Weeks    Status  New      PT LONG TERM GOAL #4   Title  Pt will demo >10/56 on BBS to improve  static balance and reduce fall risk    Baseline  BBS 4/56 (eval)    Time  8    Period  Weeks    Status  New            Plan - 09/22/19 1910    Clinical Impression Statement  Pt demo improved standing tolerance with standing in the standing frame. Patient still has excessive lean to Right and posterior but is able to correct the lean with VC 50% of the time.    Personal Factors and Comorbidities  Age;Comorbidity 3+    Comorbidities  hx of stroke, hx of colorectal cancer    Examination-Activity Limitations  Bathing;Bed Mobility;Caring for Others;Carry;Locomotion Level;Hygiene/Grooming;Dressing;Continence;Self Feeding;Reach Overhead;Sit;Lift;Other;Transfers;Toileting;Stand;Stairs    Examination-Participation Restrictions  Church;Cleaning;Community Activity;Driving;Meal Prep;Volunteer;Shop;Medication Management;Laundry;Personal Finances;Yard Work;Interpersonal Relationship    Stability/Clinical Decision Making  Unstable/Unpredictable    Clinical Decision Making  High    Rehab Potential  Good    PT Frequency  2x / week    PT Duration  6 weeks    PT Treatment/Interventions  ADLs/Self Care Home Management;Electrical Stimulation;Gait training;Stair training;Functional mobility training;Therapeutic exercise;Balance training;Neuromuscular re-education;Therapeutic activities;Patient/family education;Wheelchair mobility training;Manual techniques;Passive range of motion    PT Next Visit Plan  continue to work on sit to stand transfers, chair to chair transfers, wheelchair mobility       Patient will benefit from skilled therapeutic intervention in order to improve the following deficits and impairments:  Abnormal gait, Decreased activity tolerance, Decreased coordination, Decreased safety awareness, Decreased strength, Impaired flexibility, Impaired UE functional use, Postural dysfunction, Impaired tone, Increased edema, Decreased range of motion, Decreased knowledge of precautions, Impaired  sensation, Difficulty walking, Decreased mobility, Decreased endurance, Decreased balance  Visit  Diagnosis: Hemiplegia and hemiparesis following cerebral infarction affecting right dominant side (HCC)  Other abnormalities of gait and mobility  Unsteadiness on feet     Problem List Patient Active Problem List   Diagnosis Date Noted  . Personal history of DVT (deep vein thrombosis) 09/09/2019  . History of ischemic stroke 09/09/2019  . CAD (coronary artery disease) 09/09/2019  . Chronic constipation 09/09/2019  . History of colorectal cancer 09/09/2019  . 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 Right arm weakness 09/09/2019  . Right leg weakness 09/09/2019  . 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

## 2019-09-25 ENCOUNTER — Other Ambulatory Visit: Payer: Self-pay

## 2019-09-25 ENCOUNTER — Ambulatory Visit: Payer: No Typology Code available for payment source

## 2019-09-25 ENCOUNTER — Encounter: Payer: Self-pay | Admitting: Occupational Therapy

## 2019-09-25 ENCOUNTER — Ambulatory Visit: Payer: No Typology Code available for payment source | Admitting: Occupational Therapy

## 2019-09-25 DIAGNOSIS — R600 Localized edema: Secondary | ICD-10-CM

## 2019-09-25 DIAGNOSIS — M25611 Stiffness of right shoulder, not elsewhere classified: Secondary | ICD-10-CM

## 2019-09-25 DIAGNOSIS — R2689 Other abnormalities of gait and mobility: Secondary | ICD-10-CM

## 2019-09-25 DIAGNOSIS — R208 Other disturbances of skin sensation: Secondary | ICD-10-CM

## 2019-09-25 DIAGNOSIS — I69351 Hemiplegia and hemiparesis following cerebral infarction affecting right dominant side: Secondary | ICD-10-CM

## 2019-09-25 DIAGNOSIS — R414 Neurologic neglect syndrome: Secondary | ICD-10-CM

## 2019-09-25 DIAGNOSIS — M6281 Muscle weakness (generalized): Secondary | ICD-10-CM

## 2019-09-25 DIAGNOSIS — R41840 Attention and concentration deficit: Secondary | ICD-10-CM

## 2019-09-25 DIAGNOSIS — R2681 Unsteadiness on feet: Secondary | ICD-10-CM

## 2019-09-25 NOTE — Therapy (Signed)
[facility] [address] [place], [place], [address] Phone: [phone]   Fax:  [phone]  Physical Therapy Treatment  Patient Details  Name: Robert Mcconnell MRN: [id number] Date of Birth: [birth year] Referring Provider (PT): Arnell Sieving   Encounter Date: 09/25/2019  PT End of Session - 09/25/19 1827    Visit Number  6    Number of Visits  17    Date for PT Re-Evaluation  11/03/19    Authorization Type  [facility]    PT Start Time  1615    PT Stop Time  1700    PT Time Calculation (min)  45 min    Equipment Utilized During Treatment  Gait belt    Activity Tolerance  Patient tolerated treatment well    Behavior During Therapy  [facility] for tasks assessed/performed       Past Medical History:  Diagnosis Date  . Stroke [facility])     Past Surgical History:  Procedure Laterality Date  . COLECTOMY     cancer    There were no vitals filed for this visit.  Subjective Assessment - 09/25/19 1820    Subjective  Daughter present. He just got a loaner power wheelchair until he gets his own. They went to the park to practice it yesterday. Pt is reporting pain in bil hips since he got the chair. Daughter reports that his bed sores are compeltely healed and she does visual inspection multiple times in a day and they try to change    Patient is accompained by:  Family member    Pertinent History  CVA    Limitations  Standing;Walking;House hold activities;Sitting    How long can you sit comfortably?  1-2 hours    How long can you stand comfortably?  unable    How long can you walk comfortably?  unable    Patient Stated Goals  walk    Currently in Pain?  Other (Comment)   Yes but unable to describe it.            Wheelchair education: power wheelchair, 75% cueing to ask patient to look straight ahead and not look at controls, pt needed redirection every few feet and to go slow, needed 50% help with navigation with  control  Sit to stand in parallel bars: pt needed max A x 2 from power chair Sit to stand in standing frame: 5 x max A x 1 Partial squats in standing frame: 2 x 10 Standing in standing frame with reaching and weight shifts: 5', 5', 10'                      PT Short Term Goals - 09/22/19 1910      PT SHORT TERM GOAL #1   Title  Patient will be able to perform sit to stand with mod A with or without AD to improve independence and reduce care giver burden    Baseline  max A    Time  4    Period  Weeks    Status  New    Target Date  10/06/19      PT SHORT TERM GOAL #2   Title  pt will be able to stand with mod A for 5 min to perform small reaching with L UE to improve standing balance.    Baseline  requires max A for standing, unable to reach    Time  4    Period  Weeks  Status  New    Target Date  10/06/19      PT SHORT TERM GOAL #3   Title  Pt will be able to propel chair with SBA  for 30 feet to improve household navigation    Baseline  total A with wheelchair propulsion    Time  4    Period  Weeks    Status  New    Target Date  10/06/19        PT Long Term Goals - 09/22/19 1910      PT LONG TERM GOAL #1   Title  Patient will be able to stand for 5 min with CGA to improve standing endurance with or without AD    Baseline  requires max A    Time  8    Period  Weeks    Status  New      PT LONG TERM GOAL #2   Title  Pt will require CGA with sit to stand and chair to chair transfers to improve indepdnence and reduce caregiver burden.    Baseline  max to total A    Time  8    Period  Weeks    Status  New      PT LONG TERM GOAL #3   Title  Patient will be able to walk into walk-in shower and sit on shower chair with CGA to improve independence.    Baseline  bed sponge bath only    Time  8    Period  Weeks    Status  New      PT LONG TERM GOAL #4   Title  Pt will demo >10/56 on BBS to improve static balance and reduce fall risk    Baseline   BBS 4/56 (eval)    Time  8    Period  Weeks    Status  New            Plan - 09/25/19 1822    Clinical Impression Statement  Patient has poor safety awareness and decreased hand conrtro to navigate power wheelchair. Pt needed moderate cues to navigate the wheelchair. Daughter is primarily driving the chair. Daughter reports that since power wheelchair, he has not been able to get out of his chair with seat belt so it has improved his safety. When patient came in, he was sitting with his gluts forward in the chair and leaned back. When he was straightened in the chair, his pain improved.    Personal Factors and Comorbidities  Age;Comorbidity 3+    Comorbidities  hx of stroke, hx of colorectal cancer    Examination-Activity Limitations  Bathing;Bed Mobility;Caring for Others;Carry;Locomotion Level;Hygiene/Grooming;Dressing;Continence;Self Feeding;Reach Overhead;Sit;Lift;Other;Transfers;Toileting;Stand;Stairs    Examination-Participation Restrictions  Church;Cleaning;Community Activity;Driving;Meal Prep;Volunteer;Shop;Medication Management;Laundry;Personal Finances;Yard Work;Interpersonal Relationship    Stability/Clinical Decision Making  Unstable/Unpredictable    Rehab Potential  Good    PT Frequency  2x / week    PT Duration  6 weeks    PT Treatment/Interventions  ADLs/Self Care Home Management;Electrical Stimulation;Gait training;Stair training;Functional mobility training;Therapeutic exercise;Balance training;Neuromuscular re-education;Therapeutic activities;Patient/family education;Wheelchair mobility training;Manual techniques;Passive range of motion    PT Next Visit Plan  continue to work on sit to stand transfers, chair to chair transfers, wheelchair mobility       Patient will benefit from skilled therapeutic intervention in order to improve the following deficits and impairments:  Abnormal gait, Decreased activity tolerance, Decreased coordination, Decreased safety awareness,  Decreased strength, Impaired flexibility, Impaired UE functional use, Postural dysfunction, Impaired  tone, Increased edema, Decreased range of motion, Decreased knowledge of precautions, Impaired sensation, Difficulty walking, Decreased mobility, Decreased endurance, Decreased balance  Visit Diagnosis: Hemiplegia and hemiparesis following cerebral infarction affecting right dominant side (HCC)  Other abnormalities of gait and mobility  Unsteadiness on feet     Problem List Patient Active Problem List   Diagnosis Date Noted  . Personal history of DVT (deep vein thrombosis) 09/09/2019  . History of ischemic stroke 09/09/2019  . CAD (coronary artery disease) 09/09/2019  . Chronic constipation 09/09/2019  . History of colorectal cancer 09/09/2019  . 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 Right arm weakness 09/09/2019  . Right leg weakness 09/09/2019  . 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 [place], [place], [address] Phone: [phone]   Fax:  [phone]  Name: Robert Mcconnell MRN: [id number] Date of Birth: April 15, 1922

## 2019-09-25 NOTE — Therapy (Signed)
[facility] [address] [place], [place], [address] Phone: [phone]   Fax:  [phone]  Occupational Therapy Evaluation  Patient Details  Name: Robert Mcconnell MRN: [id number] Date of Birth: 1921-07-14 Referring Provider (OT): Eliezer Lofts   Encounter Date: 09/25/2019  OT End of Session - 09/25/19 1901    Visit Number  1    Number of Visits  13    Date for OT Re-Evaluation  11/24/19    Authorization Type  [facility]    Authorization - Visit Number  1    Authorization - Number of Visits  15    OT Start Time  1700    OT Stop Time  1750    OT Time Calculation (min)  50 min    Behavior During Therapy  --   Labile      Past Medical History:  Diagnosis Date  . Stroke [facility])     Past Surgical History:  Procedure Laterality Date  . COLECTOMY     cancer    There were no vitals filed for this visit.     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 at daughter [name]'s home)        ADL   Eating/Feeding  Minimal assistance    Grooming  Set up    Upper Body Bathing  Minimal assistance    Lower Body Bathing  Maximal assistance   bed   Upper Body Dressing  Moderate assistance    Lower Body Dressing  +1 Total aassistance    Toilet Transfer  + 1 Total assistance    Toileting - Clothing Manipulation  + 1 Total assistance    Toileting -  Hygiene  + 1 Total assistance    Tub/Shower Transfer  Other (comment)    ADL comments  Bed  bath would love to shower      Written Expression   Dominant Hand  Right      Vision - History   Baseline Vision  Bifocals    Additional Comments  Reports no visual changes since stroke      Vision Assessment   Eye Alignment  Within Functional Limits    Ocular Range of Motion  Within Functional Limits    Alignment/Gaze Preference  Within Defined Limits    Tracking/Visual Pursuits  Able to track stimulus in all quads without difficulty      Activity Tolerance   Activity Tolerance  Tolerates 10-20 min activity with multiple rests    Activity Tolerance Comments  Takes at least 2 naps daily 45-60 min      Cognition   Overall Cognitive Status  Impaired/Different from baseline    Area of Impairment  Attention;Following commands;Awareness    Current Attention Level  Sustained    Attention Comments  frequent  redirect during eval    Memory  Decreased short-term memory;Decreased recall of precautions    Memory Comments  daughter reports failing prior to stroke    Behaviors  Lability      Posture/Postural Control   Posture/Postural Control  Postural limitations    Postural Limitations  Forward head;Rounded Shoulders;Posterior pelvic tilt      Sensation   Light Touch  Impaired by gross assessment      Coordination   Gross Motor Movements are Fluid and Coordinated  No    Fine Motor Movements are Fluid and Coordinated  No    Coordination  No volitional movement noted in RUE      Perception   Perception  Impaired    Inattention/Neglect  Does not attend to right side of body      Edema   Edema  Dorsum of right hand swollen and discolored, tender      Tone   Assessment Location  Right Upper Extremity      ROM / Strength   AROM / PROM / Strength  PROM;Strength      PROM   Overall PROM   Deficits    Overall PROM Comments  shoulder limited to 90*flex      Strength   Overall Strength  Unable to assess    Overall Strength Comments  flaccid UE      Hand Function   Right Hand  Gross Grasp  Impaired    Right Hand Grip (lbs)  0   Unable to test     RUE Tone   RUE Tone  Flaccid      RUE Tone   Hypotonic Details  shoulder- inferior subluxation, no volitional movement elbow                      OT Education - 09/25/19 1900    Education Details  potential plan of care discussed with patient with daughter present    Person(s) Educated  Patient;Child(ren)    Methods  Explanation    Comprehension  Need further instruction;Verbalized understanding       OT Short Term Goals - 09/25/19 1908      OT SHORT TERM GOAL #1   Title  Patient will complete lower body bathing with max assist due 6/19    Baseline  total assist    Time  4    Period  Weeks    Status  New    Target Date  10/25/19      OT SHORT TERM GOAL #2   Title  Patient will bend forward while seated to pull pants up thighs    Baseline  not able    Time  4    Period  Weeks    Status  New      OT SHORT TERM GOAL #3   Title  Patient will transition from sit to stand with mod assist in prep for tolieting on commode    Baseline  total assist    Time  4    Period  Weeks    Status  New      OT SHORT TERM GOAL #4   Title  Patient/caregiver will don/doff custom RUE splint with min cueing    Time  4    Period  Weeks    Status  New        OT Long Term Goals - 09/25/19 1912      OT LONG TERM GOAL #1   Title  Patient will transiton from sit to stand with mod assist to allow toileting, clothing management, and hygiene    Time  6    Period  Weeks    Status  New    Target Date  11/24/19      OT LONG TERM GOAL #2   Title  Patient/caregiver will complete a home exercise program to prevent pain, and improve passive motion in RUE to aide with hygiene and dressing tasks    Baseline  Patient with pain throughout RUE    Time  6    Period  Weeks    Status  New      OT LONG TERM GOAL #3   Title  Patient / caregiver will demonstrate effective edema management techniques for right hand,  and support for right arm to prevent further subluxation    Time  6    Period  Weeks    Status  New      OT LONG TERM GOAL #4   Title  Patient will attend to right arm, and manage right arm during transitional movements and in wheelchair with min cueing    Time  6    Period  Weeks    Status  New            Plan - 09/25/19 1902    Clinical Impression Statement  Patient is a 86 year old man with history of stroke 06/29/19. Patient relocated from [place] to [place] to be with family who can provide total care.  Patient and his wife now live with his daughter and son in law.  Patient requires total assist with all lower body self care, and set up to mod assist with upper body care due to dense hemiplegi, poor postural control, decreased sensation throughout right side, decreased full awareness of deficits, decreased attention, right sided inattention, and decreased activity tolerance.  Patient may benefit from skilled OT intervention to decrease caregiver burden and reduce overall pain in RUE, and throughout trunk.    OT Occupational Profile and History  Detailed Assessment- Review of Records and additional review of physical, cognitive, psychosocial history related to current functional performance    Occupational performance deficits (Please refer to evaluation for details):  ADL's;Rest and Sleep    Body Structure / Function / Physical Skills  ADL;Coordination;Endurance;GMC;UE functional use;Balance;Decreased knowledge of precautions;Sensation;Pain;Flexibility;Body mechanics;Decreased knowledge of use of DME;FMC;Proprioception;Strength;Tone;ROM;Edema;Continence;Mobility    Cognitive Skills  Attention;Emotional;Energy/Drive;Memory;Orientation;Perception;Problem Solve;Safety Awareness;Sequencing    Rehab Potential  Fair    Clinical Decision Making  Several treatment options, min-mod task modification necessary    Comorbidities Affecting Occupational Performance:  May have comorbidities impacting  occupational performance    Modification or Assistance to Complete Evaluation   Min-Moderate modification of tasks or assist with assess necessary to complete eval    OT Frequency  2x / week    OT Duration  6 weeks    OT Treatment/Interventions  Self-care/ADL training;Electrical Stimulation;Therapeutic exercise;Visual/perceptual remediation/compensation;Patient/family education;Splinting;Neuromuscular education;Therapeutic activities;Balance training;Cognitive remediation/compensation;Passive range of motion;Manual Therapy;DME and/or AE instruction    Plan  Transfer to mat table and simulate lower body adl with daughter [name], needs custom splint    Consulted and Agree with Plan of Care  Patient;Family member/caregiver    Family Member Consulted  dtr [name]       Patient will benefit from skilled therapeutic intervention in order to improve the following deficits and impairments:   Body Structure / Function / Physical Skills: ADL, Coordination, Endurance, GMC, UE functional use, Balance, Decreased knowledge  of precautions, Sensation, Pain, Flexibility, Body mechanics, Decreased knowledge of use of DME, FMC, Proprioception, Strength, Tone, ROM, Edema, Continence, Mobility Cognitive Skills: Attention, Emotional, Energy/Drive, Memory, Orientation, Perception, Problem Solve, Safety Awareness, Sequencing     Visit Diagnosis: Hemiplegia and hemiparesis following cerebral infarction affecting right dominant side ([place]) - Plan: Ot plan of care cert/re-cert  Muscle weakness (generalized) - Plan: Ot plan of care cert/re-cert  Stiffness of right shoulder, not elsewhere classified - Plan: Ot plan of care cert/re-cert  Localized edema - Plan: Ot plan of care cert/re-cert  Attention and concentration deficit - Plan: Ot plan of care cert/re-cert  Neurologic neglect syndrome - Plan: Ot plan of care cert/re-cert  Other disturbances of skin sensation - Plan: Ot plan of care cert/re-cert    Problem  List Patient Active Problem List   Diagnosis Date Noted  . Personal history of DVT (deep vein thrombosis) 09/09/2019  . History of ischemic stroke 09/09/2019  . CAD (coronary artery disease) 09/09/2019  . Chronic constipation 09/09/2019  . History of colorectal cancer 09/09/2019  . 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 Right arm weakness 09/09/2019  . Right leg weakness 09/09/2019  . 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 [place], [place], [address] Phone: [phone]   Fax:  [phone]  Name: Zaveon Gillen MRN: [id number] Date of Birth: 06-24-1921

## 2019-09-29 ENCOUNTER — Other Ambulatory Visit: Payer: Self-pay

## 2019-09-29 ENCOUNTER — Ambulatory Visit: Payer: No Typology Code available for payment source

## 2019-09-29 ENCOUNTER — Encounter: Payer: Self-pay | Admitting: Family Medicine

## 2019-09-29 DIAGNOSIS — M6281 Muscle weakness (generalized): Secondary | ICD-10-CM

## 2019-09-29 DIAGNOSIS — I69351 Hemiplegia and hemiparesis following cerebral infarction affecting right dominant side: Secondary | ICD-10-CM

## 2019-09-29 DIAGNOSIS — N184 Chronic kidney disease, stage 4 (severe): Secondary | ICD-10-CM | POA: Insufficient documentation

## 2019-09-29 DIAGNOSIS — R2689 Other abnormalities of gait and mobility: Secondary | ICD-10-CM

## 2019-09-29 DIAGNOSIS — D696 Thrombocytopenia, unspecified: Secondary | ICD-10-CM | POA: Insufficient documentation

## 2019-09-29 DIAGNOSIS — R2681 Unsteadiness on feet: Secondary | ICD-10-CM

## 2019-09-29 NOTE — Therapy (Signed)
[facility] [address] [place], [place], [address] Phone: [phone]   Fax:  [phone]  Physical Therapy Treatment  Patient Details  Name: Robert Mcconnell MRN: [id number] Date of Birth: 1922-04-05 Referring Provider (PT): Arnell Sieving   Encounter Date: 09/29/2019  PT End of Session - 09/29/19 1846    Visit Number  7    Number of Visits  17    Date for PT Re-Evaluation  11/03/19    PT Start Time  1700    PT Stop Time  1745    PT Time Calculation (min)  45 min    Equipment Utilized During Treatment  Gait belt    Activity Tolerance  Patient tolerated treatment well    Behavior During Therapy  [facility] for tasks assessed/performed       Past Medical History:  Diagnosis Date  . Stroke [facility])     Past Surgical History:  Procedure Laterality Date  . COLECTOMY     cancer    There were no vitals filed for this visit.  Subjective Assessment - 09/29/19 1843    Subjective  No falls. Daughter reports they continue to work on standing and biking at home.    Patient is accompained by:  Family member    Pertinent History  CVA    Limitations  Standing;Walking;House hold activities;Sitting    How long can you sit comfortably?  1-2 hours    How long can you stand comfortably?  unable    How long can you walk comfortably?  unable    Patient Stated Goals  walk                 Treatment Transfer from power chair to standard wheelchair (with 2 airex cushions): required max A to transfer to and from powre chair into standard chair Sit to stand using parallel bars: 15x Sit to stand with using chair handle on left to push and stabilizing with parallel bar in front: 10x Stepping forward with L LE: pt holding parallel bar with L UE, PT stabilizing R knee into extension and tactile cues to shift weight forward and bwd as patient moves steps forward and backward: 20 steps Gait training: 5 steps: max A, use of  parallel bar, PT blocking R knee and advancing R LE forward, cues to shift weight to left side during R passive swing phase                PT Education - 09/29/19 1845    Education Details  Daughter educated on practicing lateal weight shifts while in standing at home    Person(s) Educated  Patient    Methods  Explanation    Comprehension  Verbalized understanding       PT Short Term Goals - 09/29/19 1843      PT SHORT TERM GOAL #1   Title  Patient will be able to perform sit to stand with mod A with or without AD to improve independence and reduce care giver burden    Baseline  max A    Time  4    Period  Weeks    Status  New    Target Date  10/06/19      PT SHORT TERM GOAL #2   Title  pt will be able to stand with mod A for 5 min to perform small reaching with L UE to improve standing balance.    Baseline  requires max A for standing, unable  to reach    Time  4    Period  Weeks    Status  New    Target Date  10/06/19      PT SHORT TERM GOAL #3   Title  Pt will be able to propel chair with SBA  for 30 feet to improve household navigation    Baseline  total A with wheelchair propulsion    Time  4    Period  Weeks    Status  New    Target Date  10/06/19        PT Long Term Goals - 09/29/19 1843      PT LONG TERM GOAL #1   Title  Patient will be able to stand for 5 min with CGA to improve standing endurance with or without AD    Baseline  requires max A    Time  8    Period  Weeks    Status  New      PT LONG TERM GOAL #2   Title  Pt will require CGA with sit to stand and chair to chair transfers to improve indepdnence and reduce caregiver burden.    Baseline  max to total A    Time  8    Period  Weeks    Status  New      PT LONG TERM GOAL #3   Title  Patient will be able to walk into walk-in shower and sit on shower chair with CGA to improve independence.    Baseline  bed sponge bath only    Time  8    Period  Weeks    Status  New      PT LONG  TERM GOAL #4   Title  Pt will demo >10/56 on BBS to improve static balance and reduce fall risk    Baseline  BBS 4/56 (eval)    Time  8    Period  Weeks    Status  New            Plan - 09/29/19 1844    Clinical Impression Statement  Pt requires mod to max A, varying level of asstistance when standing up with pushing from chair handle. Requires min A to stand up if pulling on support surface in front of him (parallel bar). Patien requires max A to stabilize R LEG and trunk when stepping forward with L leg. Pt still requires max cueing to navigate power wheelchair    Personal Factors and Comorbidities  Age;Comorbidity 3+    Comorbidities  hx of stroke, hx of colorectal cancer    Examination-Activity Limitations  Bathing;Bed Mobility;Caring for Others;Carry;Locomotion Level;Hygiene/Grooming;Dressing;Continence;Self Feeding;Reach Overhead;Sit;Lift;Other;Transfers;Toileting;Stand;Stairs    Examination-Participation Restrictions  Church;Cleaning;Community Activity;Driving;Meal Prep;Volunteer;Shop;Medication Management;Laundry;Personal Finances;Yard Work;Interpersonal Relationship    Stability/Clinical Decision Making  Unstable/Unpredictable    Rehab Potential  Good    PT Frequency  2x / week    PT Duration  6 weeks    PT Treatment/Interventions  ADLs/Self Care Home Management;Electrical Stimulation;Gait training;Stair training;Functional mobility training;Therapeutic exercise;Balance training;Neuromuscular re-education;Therapeutic activities;Patient/family education;Wheelchair mobility training;Manual techniques;Passive range of motion    PT Next Visit Plan  continue to work on sit to stand transfers, chair to chair transfers, wheelchair mobility       Patient will benefit from skilled therapeutic intervention in order to improve the following deficits and impairments:  Abnormal gait, Decreased activity tolerance, Decreased coordination, Decreased safety awareness, Decreased strength,  Impaired flexibility, Impaired UE functional use, Postural dysfunction, Impaired tone, Increased edema,  Decreased range of motion, Decreased knowledge of precautions, Impaired sensation, Difficulty walking, Decreased mobility, Decreased endurance, Decreased balance  Visit Diagnosis: Hemiplegia and hemiparesis following cerebral infarction affecting right dominant side (HCC)  Muscle weakness (generalized)  Other abnormalities of gait and mobility  Unsteadiness on feet     Problem List Patient Active Problem List   Diagnosis Date Noted  . CKD (chronic kidney disease) stage 4, GFR 15-29 ml/min (HCC) 09/29/2019  . Thrombocytopenia ([place]) 09/29/2019  . Personal history of DVT (deep vein thrombosis) 09/09/2019  . History of ischemic stroke 09/09/2019  . CAD (coronary artery disease) 09/09/2019  . Chronic constipation 09/09/2019  . History of colorectal cancer 09/09/2019  . 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 Right arm weakness 09/09/2019  . Right leg weakness 09/09/2019  . 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 [place], [place], [address] Phone: [phone]   Fax:  [phone]  Name: Alp Goldwater MRN: [id number] Date of Birth: 07/20/1921

## 2019-10-02 ENCOUNTER — Ambulatory Visit: Payer: No Typology Code available for payment source | Admitting: Occupational Therapy

## 2019-10-02 ENCOUNTER — Other Ambulatory Visit: Payer: Self-pay

## 2019-10-02 ENCOUNTER — Encounter: Payer: Self-pay | Admitting: Occupational Therapy

## 2019-10-02 ENCOUNTER — Ambulatory Visit: Payer: No Typology Code available for payment source

## 2019-10-02 DIAGNOSIS — M25611 Stiffness of right shoulder, not elsewhere classified: Secondary | ICD-10-CM

## 2019-10-02 DIAGNOSIS — R41840 Attention and concentration deficit: Secondary | ICD-10-CM

## 2019-10-02 DIAGNOSIS — R600 Localized edema: Secondary | ICD-10-CM

## 2019-10-02 DIAGNOSIS — I69351 Hemiplegia and hemiparesis following cerebral infarction affecting right dominant side: Secondary | ICD-10-CM

## 2019-10-02 DIAGNOSIS — R41841 Cognitive communication deficit: Secondary | ICD-10-CM

## 2019-10-02 DIAGNOSIS — R208 Other disturbances of skin sensation: Secondary | ICD-10-CM

## 2019-10-02 DIAGNOSIS — M6281 Muscle weakness (generalized): Secondary | ICD-10-CM

## 2019-10-02 DIAGNOSIS — R414 Neurologic neglect syndrome: Secondary | ICD-10-CM

## 2019-10-02 DIAGNOSIS — R2689 Other abnormalities of gait and mobility: Secondary | ICD-10-CM

## 2019-10-02 DIAGNOSIS — R2681 Unsteadiness on feet: Secondary | ICD-10-CM

## 2019-10-02 NOTE — Therapy (Signed)
[facility] [address] [place], [place], [address] Phone: [phone]   Fax:  [phone]  Physical Therapy Treatment  Patient Details  Name: Robert Mcconnell MRN: [id number] Date of Birth: 1922-04-23 Referring Provider (PT): Arnell Sieving   Encounter Date: 10/02/2019  PT End of Session - 10/02/19 1855    Visit Number  8    Number of Visits  17    Date for PT Re-Evaluation  11/03/19    Authorization Type  [facility]    Authorization Time Period  09/04/19 to 01/02/20    Authorization - Visit Number  8    Authorization - Number of Visits  15    Progress Note Due on Visit  15    PT Start Time  9449    PT Stop Time  1700    PT Time Calculation (min)  45 min    Equipment Utilized During Treatment  Gait belt    Activity Tolerance  Patient tolerated treatment well    Behavior During Therapy  [facility] for tasks assessed/performed       Past Medical History:  Diagnosis Date  . Stroke [facility])     Past Surgical History:  Procedure Laterality Date  . COLECTOMY     cancer    There were no vitals filed for this visit.  Subjective Assessment - 10/02/19 1852    Subjective  No new updates from daughter. They continue to work on sit to stand, standing, bicycling etc.    Patient is accompained by:  Family member    Pertinent History  CVA    Limitations  Standing;Walking;House hold activities;Sitting    How long can you sit comfortably?  1-2 hours    How long can you stand comfortably?  unable    How long can you walk comfortably?  unable    Patient Stated Goals  walk    Currently in Pain?  No/denies            Treatment  Seated manually resisted hip abduction/adduction: 20x Seated yellow band reisted hip abduction: 20x, tactile and verbal cues required, trace response noted in R hip abductors Seated manually stretched hamstrings and calf muscles on R LE Supine hooklying isometric hip adduction squeeze with playball  and lower trunk rtoations: 15x Supine hooklying marching: trace response in hip flexors noted  Therapeutic activity: Power chair to mat table transfers using slide board: mod A with verbal and tactile cueing: 2x each direction                    PT Education - 10/02/19 1854    Education Details  Daughter educated on mechanics of slide board and how to propery cue and guide patient during transfer.    Person(s) Educated  Child(ren);Patient    Methods  Explanation;Demonstration    Comprehension  Verbalized understanding       PT Short Term Goals - 09/29/19 1843      PT SHORT TERM GOAL #1   Title  Patient will be able to perform sit to stand with mod A with or without AD to improve independence and reduce care giver burden    Baseline  max A    Time  4    Period  Weeks    Status  New    Target Date  10/06/19      PT SHORT TERM GOAL #2   Title  pt will be able to stand with mod A for 5  min to perform small reaching with L UE to improve standing balance.    Baseline  requires max A for standing, unable to reach    Time  4    Period  Weeks    Status  New    Target Date  10/06/19      PT SHORT TERM GOAL #3   Title  Pt will be able to propel chair with SBA  for 30 feet to improve household navigation    Baseline  total A with wheelchair propulsion    Time  4    Period  Weeks    Status  New    Target Date  10/06/19        PT Long Term Goals - 09/29/19 1843      PT LONG TERM GOAL #1   Title  Patient will be able to stand for 5 min with CGA to improve standing endurance with or without AD    Baseline  requires max A    Time  8    Period  Weeks    Status  New      PT LONG TERM GOAL #2   Title  Pt will require CGA with sit to stand and chair to chair transfers to improve indepdnence and reduce caregiver burden.    Baseline  max to total A    Time  8    Period  Weeks    Status  New      PT LONG TERM GOAL #3   Title  Patient will be able to walk into  walk-in shower and sit on shower chair with CGA to improve independence.    Baseline  bed sponge bath only    Time  8    Period  Weeks    Status  New      PT LONG TERM GOAL #4   Title  Pt will demo >10/56 on BBS to improve static balance and reduce fall risk    Baseline  BBS 4/56 (eval)    Time  8    Period  Weeks    Status  New            Plan - 10/02/19 1853    Clinical Impression Statement  Pt was able to transfer from power chair to mat table with mod A using a sliding board. Patient able to roll from supine to R SL with min A and supine to L SL with mod to max A. Patient is starting demonstrate improve muscle conetrol with hip flexio, abduction, aadduction on involved side.    Personal Factors and Comorbidities  Age;Comorbidity 3+    Comorbidities  hx of stroke, hx of colorectal cancer    Examination-Activity Limitations  Bathing;Bed Mobility;Caring for Others;Carry;Locomotion Level;Hygiene/Grooming;Dressing;Continence;Self Feeding;Reach Overhead;Sit;Lift;Other;Transfers;Toileting;Stand;Stairs    Examination-Participation Restrictions  Church;Cleaning;Community Activity;Driving;Meal Prep;Volunteer;Shop;Medication Management;Laundry;Personal Finances;Yard Work;Interpersonal Relationship    Stability/Clinical Decision Making  Unstable/Unpredictable    Rehab Potential  Good    PT Frequency  2x / week    PT Duration  6 weeks    PT Treatment/Interventions  ADLs/Self Care Home Management;Electrical Stimulation;Gait training;Stair training;Functional mobility training;Therapeutic exercise;Balance training;Neuromuscular re-education;Therapeutic activities;Patient/family education;Wheelchair mobility training;Manual techniques;Passive range of motion    PT Next Visit Plan  continue to work on sit to stand transfers, chair to chair transfers, wheelchair mobility       Patient will benefit from skilled therapeutic intervention in order to improve the following deficits and impairments:   Abnormal gait, Decreased activity tolerance, Decreased  coordination, Decreased safety awareness, Decreased strength, Impaired flexibility, Impaired UE functional use, Postural dysfunction, Impaired tone, Increased edema, Decreased range of motion, Decreased knowledge of precautions, Impaired sensation, Difficulty walking, Decreased mobility, Decreased endurance, Decreased balance  Visit Diagnosis: Hemiplegia and hemiparesis following cerebral infarction affecting right dominant side (HCC)  Muscle weakness (generalized)  Other abnormalities of gait and mobility  Unsteadiness on feet     Problem List Patient Active Problem List   Diagnosis Date Noted  . CKD (chronic kidney disease) stage 4, GFR 15-29 ml/min (HCC) 09/29/2019  . Thrombocytopenia ([place]) 09/29/2019  . Personal history of DVT (deep vein thrombosis) 09/09/2019  . History of ischemic stroke 09/09/2019  . CAD (coronary artery disease) 09/09/2019  . Chronic constipation 09/09/2019  . History of colorectal cancer 09/09/2019  . 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 Right arm weakness 09/09/2019  . Right leg weakness 09/09/2019  . 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

## 2019-10-02 NOTE — Therapy (Signed)
[facility] [address] [place], [place], [address] Phone: [phone]   Fax:  [phone]  Occupational Therapy Treatment  Patient Details  Name: Robert Mcconnell MRN: [id number] Date of Birth: 06/21/1921 Referring Provider (OT): Eliezer Lofts   Encounter Date: 10/02/2019  OT End of Session - 10/02/19 1806    Visit Number  2    Number of Visits  13    Date for OT Re-Evaluation  11/24/19    Authorization Type  [facility]    Authorization - Visit Number  2    Authorization - Number of Visits  15    OT Start Time  1700    OT Stop Time  1745    OT Time Calculation (min)  45 min    Activity Tolerance  Patient tolerated treatment well    Behavior During Therapy  [facility] for tasks assessed/performed       Past Medical History:  Diagnosis Date  . Stroke [facility])     Past Surgical History:  Procedure Laterality Date  . COLECTOMY     cancer    There were no vitals filed for this visit.  Subjective Assessment - 10/02/19 1753    Subjective   My leg is no good    Patient is accompanied by:  Family member    Currently in Pain?  No/denies                   OT Treatments/Exercises (OP) - 10/02/19 0001      ADLs   LB Dressing  Worked on static to dynamic sitting balance in preparation for lower body ADL's.      ADL Comments  Reviewed short and long term goals with patient, daughter, and son in law.        Neurological Re-education Exercises   Other Exercises 1  Neuromuscular reeducation to address postural control and sitting balance.  Worked toward transitional movements to assist with scooting, level surface slide board transfers.  Patient able to lean forward, and maintain feet on floor.  Patient able to scoot toward weaker left side, patient resistant to all weight shifts toward right side.  Patient with contraversive pusher syndrome, and very fearful of exploration of  right and forward space.  Patient able to  complete a slide board transfer to left with min assist to adjust his feet.  Patient able to lift off surface, pushing through feet.  Patient able to partially stand with mod assist to adjust hips into wheelchair.               OT Education - 10/02/19 1806    Education Details  OT goals discussed with patient and family    Person(s) Educated  Patient;Child(ren)    Methods  Explanation    Comprehension  Verbalized understanding;Need further instruction       OT Short Term Goals - 09/25/19 1908      OT SHORT TERM GOAL #1   Title  Patient will complete lower body bathing with max assist due 6/19    Baseline  total assist    Time  4    Period  Weeks    Status  New    Target Date  10/25/19      OT SHORT TERM GOAL #2   Title  Patient will bend forward while seated to pull pants up thighs    Baseline  not able    Time  4    Period  Weeks  Status  New      OT SHORT TERM GOAL #3   Title  Patient will transition from sit to stand with mod assist in prep for tolieting on commode    Baseline  total assist    Time  4    Period  Weeks    Status  New      OT SHORT TERM GOAL #4   Title  Patient/caregiver will don/doff custom RUE splint with min cueing    Time  4    Period  Weeks    Status  New        OT Long Term Goals - 09/25/19 1912      OT LONG TERM GOAL #1   Title  Patient will transiton from sit to stand with mod assist to allow toileting, clothing management, and hygiene    Time  6    Period  Weeks    Status  New    Target Date  11/24/19      OT LONG TERM GOAL #2   Title  Patient/caregiver will complete a home exercise program to prevent pain, and improve passive motion in RUE to aide with hygiene and dressing tasks    Baseline  Patient with pain throughout RUE    Time  6    Period  Weeks    Status  New      OT LONG TERM GOAL #3   Title  Patient / caregiver will demonstrate effective edema management techniques for right hand, and support for right arm to  prevent further subluxation    Time  6    Period  Weeks    Status  New      OT LONG TERM GOAL #4   Title  Patient will attend to right arm, and manage right arm during transitional movements and in wheelchair with min cueing    Time  6    Period  Weeks    Status  New            Plan - 10/02/19 1807    Clinical Impression Statement  Patient and family on board with OT goals to help reduce caregiver burdern with ADL's.  Patient with significant postural control deficits and perceptual deficits.    OT Frequency  2x / week    OT Duration  6 weeks    OT Treatment/Interventions  Self-care/ADL training;Electrical Stimulation;Therapeutic exercise;Visual/perceptual remediation/compensation;Patient/family education;Splinting;Neuromuscular education;Therapeutic activities;Balance training;Cognitive remediation/compensation;Passive range of motion;Manual Therapy;DME and/or AE instruction    Plan  Postural control, sitting balance, sit to lift off, weight shift right, lower body ADL weight shifts    Consulted and Agree with Plan of Care  Patient;Family member/caregiver    Family Member Consulted  dtr [name]       Patient will benefit from skilled therapeutic intervention in order to improve the following deficits and impairments:           Visit Diagnosis: Hemiplegia and hemiparesis following cerebral infarction affecting right dominant side (HCC)  Muscle weakness (generalized)  Unsteadiness on feet  Stiffness of right shoulder, not elsewhere classified  Localized edema  Attention and concentration deficit  Neurologic neglect syndrome  Other disturbances of skin sensation  Cognitive communication deficit    Problem List Patient Active Problem List   Diagnosis Date Noted  . CKD (chronic kidney disease) stage 4, GFR 15-29 ml/min (HCC) 09/29/2019  . Thrombocytopenia ([place]) 09/29/2019  . Personal history of DVT (deep vein thrombosis) 09/09/2019  . History of ischemic stroke  09/09/2019  . CAD (coronary artery disease) 09/09/2019  . Chronic constipation 09/09/2019  . History of colorectal cancer 09/09/2019  . 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 Right arm weakness 09/09/2019  . Right leg weakness 09/09/2019  . 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

## 2019-10-07 ENCOUNTER — Other Ambulatory Visit: Payer: Self-pay

## 2019-10-07 ENCOUNTER — Ambulatory Visit: Payer: No Typology Code available for payment source | Attending: Physician Assistant

## 2019-10-07 DIAGNOSIS — R4701 Aphasia: Secondary | ICD-10-CM | POA: Diagnosis present

## 2019-10-07 DIAGNOSIS — I69351 Hemiplegia and hemiparesis following cerebral infarction affecting right dominant side: Secondary | ICD-10-CM | POA: Insufficient documentation

## 2019-10-07 DIAGNOSIS — R471 Dysarthria and anarthria: Secondary | ICD-10-CM | POA: Diagnosis present

## 2019-10-07 DIAGNOSIS — M6281 Muscle weakness (generalized): Secondary | ICD-10-CM | POA: Diagnosis present

## 2019-10-07 DIAGNOSIS — R600 Localized edema: Secondary | ICD-10-CM | POA: Diagnosis present

## 2019-10-07 DIAGNOSIS — R1310 Dysphagia, unspecified: Secondary | ICD-10-CM | POA: Insufficient documentation

## 2019-10-07 DIAGNOSIS — R2681 Unsteadiness on feet: Secondary | ICD-10-CM | POA: Diagnosis present

## 2019-10-07 DIAGNOSIS — R41841 Cognitive communication deficit: Secondary | ICD-10-CM | POA: Diagnosis present

## 2019-10-07 DIAGNOSIS — R414 Neurologic neglect syndrome: Secondary | ICD-10-CM | POA: Insufficient documentation

## 2019-10-07 DIAGNOSIS — R41840 Attention and concentration deficit: Secondary | ICD-10-CM | POA: Insufficient documentation

## 2019-10-07 DIAGNOSIS — R2689 Other abnormalities of gait and mobility: Secondary | ICD-10-CM | POA: Insufficient documentation

## 2019-10-07 DIAGNOSIS — M25611 Stiffness of right shoulder, not elsewhere classified: Secondary | ICD-10-CM | POA: Diagnosis present

## 2019-10-07 NOTE — Therapy (Signed)
[facility] [address] [place], [place], [address] Phone: [phone]   Fax:  [phone]  Physical Therapy Treatment  Patient Details  Name: Robert Mcconnell MRN: [id number] Date of Birth: 1922-04-05 Referring Provider (PT): Arnell Sieving   Encounter Date: 10/07/2019  PT End of Session - 10/07/19 1811    Visit Number  9    Number of Visits  17    Date for PT Re-Evaluation  11/03/19    Authorization Type  [place]    Authorization Time Period  09/04/19 to 01/02/20    Authorization - Visit Number  8    Authorization - Number of Visits  15    Progress Note Due on Visit  15    Equipment Utilized During Treatment  Gait belt    Activity Tolerance  Patient tolerated treatment well    Behavior During Therapy  [facility] for tasks assessed/performed       Past Medical History:  Diagnosis Date  . Stroke [facility])     Past Surgical History:  Procedure Laterality Date  . COLECTOMY     cancer    There were no vitals filed for this visit.  Subjective Assessment - 10/07/19 1638    Subjective  no new falls, I have been walking without using cane, I just use the cane in hand.    Patient is accompained by:  Family member    Pertinent History  CVA    Limitations  Standing;Walking;House hold activities;Sitting    How long can you sit comfortably?  1-2 hours    How long can you stand comfortably?  unable    How long can you walk comfortably?  unable    Patient Stated Goals  walk                 Treatment Sit to stand transfers from power chair to standing: pt's knees against mat table for block: 15x, cues for leaning to left, leaning forward, maintaining weight shift to left. Tactile and verbal cues. Required max A Sit to stand in paralle bars with pulling on bar: mox A 3x Gait training: in parallel bars, max A to block knees, 3 steps               PT Education - 10/07/19 1808    Education Details   Educated daughter to now starting practicing pushing off from arm rest to stand up.    Person(s) Educated  Patient    Methods  Explanation    Comprehension  Verbalized understanding       PT Short Term Goals - 09/29/19 1843      PT SHORT TERM GOAL #1   Title  Patient will be able to perform sit to stand with mod A with or without AD to improve independence and reduce care giver burden    Baseline  max A    Time  4    Period  Weeks    Status  New    Target Date  10/06/19      PT SHORT TERM GOAL #2   Title  pt will be able to stand with mod A for 5 min to perform small reaching with L UE to improve standing balance.    Baseline  requires max A for standing, unable to reach    Time  4    Period  Weeks    Status  New    Target Date  10/06/19  PT SHORT TERM GOAL #3   Title  Pt will be able to propel chair with SBA  for 30 feet to improve household navigation    Baseline  total A with wheelchair propulsion    Time  4    Period  Weeks    Status  New    Target Date  10/06/19        PT Long Term Goals - 09/29/19 1843      PT LONG TERM GOAL #1   Title  Patient will be able to stand for 5 min with CGA to improve standing endurance with or without AD    Baseline  requires max A    Time  8    Period  Weeks    Status  New      PT LONG TERM GOAL #2   Title  Pt will require CGA with sit to stand and chair to chair transfers to improve indepdnence and reduce caregiver burden.    Baseline  max to total A    Time  8    Period  Weeks    Status  New      PT LONG TERM GOAL #3   Title  Patient will be able to walk into walk-in shower and sit on shower chair with CGA to improve independence.    Baseline  bed sponge bath only    Time  8    Period  Weeks    Status  New      PT LONG TERM GOAL #4   Title  Pt will demo >10/56 on BBS to improve static balance and reduce fall risk    Baseline  BBS 4/56 (eval)    Time  8    Period  Weeks    Status  New            Plan -  10/07/19 1638    Clinical Impression Statement  Patient is able to pull on something infront and requires mod A for sit to stand. Today, we practiced pushing from using wheelchair arm rest and patient requires max A to stand up. Patient tends to lean towards his R side when pushing off the chair. He requires max cueing to lean towards his left side and is able to maintain proper left sided weight shift for 50% of the transition from sif to stand but then leans over to R side. Patient was c/o L knee pain and his back pain which was preventing him from participating in the transfewrs today. Pt unable to quantify pain levels.    Personal Factors and Comorbidities  Age;Comorbidity 3+    Comorbidities  hx of stroke, hx of colorectal cancer    Examination-Activity Limitations  Bathing;Bed Mobility;Caring for Others;Carry;Locomotion Level;Hygiene/Grooming;Dressing;Continence;Self Feeding;Reach Overhead;Sit;Lift;Other;Transfers;Toileting;Stand;Stairs    Examination-Participation Restrictions  Church;Cleaning;Community Activity;Driving;Meal Prep;Volunteer;Shop;Medication Management;Laundry;Personal Finances;Yard Work;Interpersonal Relationship    Stability/Clinical Decision Making  Unstable/Unpredictable    Rehab Potential  Good    PT Frequency  2x / week    PT Duration  6 weeks    PT Treatment/Interventions  ADLs/Self Care Home Management;Electrical Stimulation;Gait training;Stair training;Functional mobility training;Therapeutic exercise;Balance training;Neuromuscular re-education;Therapeutic activities;Patient/family education;Wheelchair mobility training;Manual techniques;Passive range of motion    PT Next Visit Plan  continue to work on sit to stand transfers, chair to chair transfers, wheelchair mobility       Patient will benefit from skilled therapeutic intervention in order to improve the following deficits and impairments:  Abnormal gait, Decreased activity tolerance, Decreased coordination,  Decreased  safety awareness, Decreased strength, Impaired flexibility, Impaired UE functional use, Postural dysfunction, Impaired tone, Increased edema, Decreased range of motion, Decreased knowledge of precautions, Impaired sensation, Difficulty walking, Decreased mobility, Decreased endurance, Decreased balance  Visit Diagnosis: Hemiplegia and hemiparesis following cerebral infarction affecting right dominant side (HCC)  Muscle weakness (generalized)  Unsteadiness on feet  Other abnormalities of gait and mobility     Problem List Patient Active Problem List   Diagnosis Date Noted  . CKD (chronic kidney disease) stage 4, GFR 15-29 ml/min (HCC) 09/29/2019  . Thrombocytopenia ([place]) 09/29/2019  . Personal history of DVT (deep vein thrombosis) 09/09/2019  . History of ischemic stroke 09/09/2019  . CAD (coronary artery disease) 09/09/2019  . Chronic constipation 09/09/2019  . History of colorectal cancer 09/09/2019  . 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 Right arm weakness 09/09/2019  . Right leg weakness 09/09/2019  . 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 [place], [place], [address] Phone: [phone]   Fax:  [phone]  Name: Robert Mcconnell MRN: [id number] Date of Birth: 1921-07-27

## 2019-10-09 ENCOUNTER — Ambulatory Visit: Payer: No Typology Code available for payment source | Admitting: Occupational Therapy

## 2019-10-09 ENCOUNTER — Other Ambulatory Visit: Payer: Self-pay

## 2019-10-09 ENCOUNTER — Encounter: Payer: Self-pay | Admitting: Occupational Therapy

## 2019-10-09 ENCOUNTER — Ambulatory Visit: Payer: No Typology Code available for payment source | Admitting: Speech Pathology

## 2019-10-09 ENCOUNTER — Ambulatory Visit: Payer: No Typology Code available for payment source

## 2019-10-09 DIAGNOSIS — R414 Neurologic neglect syndrome: Secondary | ICD-10-CM

## 2019-10-09 DIAGNOSIS — R471 Dysarthria and anarthria: Secondary | ICD-10-CM

## 2019-10-09 DIAGNOSIS — R41840 Attention and concentration deficit: Secondary | ICD-10-CM

## 2019-10-09 DIAGNOSIS — M6281 Muscle weakness (generalized): Secondary | ICD-10-CM

## 2019-10-09 DIAGNOSIS — I69351 Hemiplegia and hemiparesis following cerebral infarction affecting right dominant side: Secondary | ICD-10-CM

## 2019-10-09 DIAGNOSIS — M25611 Stiffness of right shoulder, not elsewhere classified: Secondary | ICD-10-CM

## 2019-10-09 DIAGNOSIS — R2681 Unsteadiness on feet: Secondary | ICD-10-CM

## 2019-10-09 DIAGNOSIS — R41841 Cognitive communication deficit: Secondary | ICD-10-CM

## 2019-10-09 DIAGNOSIS — R1310 Dysphagia, unspecified: Secondary | ICD-10-CM

## 2019-10-09 DIAGNOSIS — R2689 Other abnormalities of gait and mobility: Secondary | ICD-10-CM

## 2019-10-09 DIAGNOSIS — R600 Localized edema: Secondary | ICD-10-CM

## 2019-10-09 NOTE — Therapy (Signed)
[facility] [address] [place], [place], [address] Phone: [phone]   Fax:  [phone]  Physical Therapy Treatment  Patient Details  Name: Robert Mcconnell MRN: [id number] Date of Birth: 11/30/1921 Referring Provider (PT): Arnell Sieving   Encounter Date: 10/09/2019  PT End of Session - 10/09/19 1849    Visit Number  10    Number of Visits  17    Date for PT Re-Evaluation  11/03/19    Authorization Type  VA    Authorization Time Period  09/04/19 to 01/02/20    Authorization - Visit Number  8    Authorization - Number of Visits  15    Progress Note Due on Visit  15    PT Start Time  5035    PT Stop Time  1700    PT Time Calculation (min)  45 min    Equipment Utilized During Treatment  Gait belt    Activity Tolerance  Patient tolerated treatment well    Behavior During Therapy  [facility] for tasks assessed/performed       Past Medical History:  Diagnosis Date  . Stroke [facility])     Past Surgical History:  Procedure Laterality Date  . COLECTOMY     cancer    There were no vitals filed for this visit.  Subjective Assessment - 10/09/19 1847    Subjective  They have tried to work on pushing from chair but pt has excessive lean to R according to daughter.    Patient is accompained by:  Family member    Pertinent History  CVA    Limitations  Standing;Walking;House hold activities;Sitting    How long can you sit comfortably?  1-2 hours    How long can you stand comfortably?  unable    How long can you walk comfortably?  unable    Patient Stated Goals  walk        Partial squats with paralle bars: 10x Partial squats with pushing off from chair: 2 x 10 Side steps at paralle bars: 3 steps eacy wa: max A Fwd steps in parallel bars: 5 steps with max A, cues to weight shift to left, total A required to move R leg forward Wheelchair pulls: 15 feet, tactile cues for approximation at  heel                          PT Short Term Goals - 09/29/19 1843      PT SHORT TERM GOAL #1   Title  Patient will be able to perform sit to stand with mod A with or without AD to improve independence and reduce care giver burden    Baseline  max A    Time  4    Period  Weeks    Status  New    Target Date  10/06/19      PT SHORT TERM GOAL #2   Title  pt will be able to stand with mod A for 5 min to perform small reaching with L UE to improve standing balance.    Baseline  requires max A for standing, unable to reach    Time  4    Period  Weeks    Status  New    Target Date  10/06/19      PT SHORT TERM GOAL #3   Title  Pt will be able to propel chair with SBA  for 30 feet  to improve household navigation    Baseline  total A with wheelchair propulsion    Time  4    Period  Weeks    Status  New    Target Date  10/06/19        PT Long Term Goals - 09/29/19 1843      PT LONG TERM GOAL #1   Title  Patient will be able to stand for 5 min with CGA to improve standing endurance with or without AD    Baseline  requires max A    Time  8    Period  Weeks    Status  New      PT LONG TERM GOAL #2   Title  Pt will require CGA with sit to stand and chair to chair transfers to improve indepdnence and reduce caregiver burden.    Baseline  max to total A    Time  8    Period  Weeks    Status  New      PT LONG TERM GOAL #3   Title  Patient will be able to walk into walk-in shower and sit on shower chair with CGA to improve independence.    Baseline  bed sponge bath only    Time  8    Period  Weeks    Status  New      PT LONG TERM GOAL #4   Title  Pt will demo >10/56 on BBS to improve static balance and reduce fall risk    Baseline  BBS 4/56 (eval)    Time  8    Period  Weeks    Status  New            Plan - 10/09/19 1848    Clinical Impression Statement  Pt able to demonstrate getting up from chair and down to chair with pushing from arm rest  of the chair with mod A with 70% of the time with verbal cues. Patient able to take 3 steps sideways with max A. Pt able to take 5 steps forward with max A in parallel bars. Patient demonstrated improved hamstrings firing with trace amount during wheelchair pulls.    Personal Factors and Comorbidities  Age;Comorbidity 3+    Comorbidities  hx of stroke, hx of colorectal cancer    Examination-Activity Limitations  Bathing;Bed Mobility;Caring for Others;Carry;Locomotion Level;Hygiene/Grooming;Dressing;Continence;Self Feeding;Reach Overhead;Sit;Lift;Other;Transfers;Toileting;Stand;Stairs    Examination-Participation Restrictions  Church;Cleaning;Community Activity;Driving;Meal Prep;Volunteer;Shop;Medication Management;Laundry;Personal Finances;Yard Work;Interpersonal Relationship    Stability/Clinical Decision Making  Unstable/Unpredictable    Rehab Potential  Good    PT Frequency  2x / week    PT Duration  6 weeks    PT Treatment/Interventions  ADLs/Self Care Home Management;Electrical Stimulation;Gait training;Stair training;Functional mobility training;Therapeutic exercise;Balance training;Neuromuscular re-education;Therapeutic activities;Patient/family education;Wheelchair mobility training;Manual techniques;Passive range of motion    PT Next Visit Plan  continue to work on sit to stand transfers, chair to chair transfers, wheelchair mobility       Patient will benefit from skilled therapeutic intervention in order to improve the following deficits and impairments:  Abnormal gait, Decreased activity tolerance, Decreased coordination, Decreased safety awareness, Decreased strength, Impaired flexibility, Impaired UE functional use, Postural dysfunction, Impaired tone, Increased edema, Decreased range of motion, Decreased knowledge of precautions, Impaired sensation, Difficulty walking, Decreased mobility, Decreased endurance, Decreased balance  Visit Diagnosis: Hemiplegia and hemiparesis following  cerebral infarction affecting right dominant side (HCC)  Muscle weakness (generalized)  Unsteadiness on feet  Other abnormalities of gait and mobility  Problem List Patient Active Problem List   Diagnosis Date Noted  . CKD (chronic kidney disease) stage 4, GFR 15-29 ml/min (HCC) 09/29/2019  . Thrombocytopenia ([place]) 09/29/2019  . Personal history of DVT (deep vein thrombosis) 09/09/2019  . History of ischemic stroke 09/09/2019  . CAD (coronary artery disease) 09/09/2019  . Chronic constipation 09/09/2019  . History of colorectal cancer 09/09/2019  . 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 Right arm weakness 09/09/2019  . Right leg weakness 09/09/2019  . 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 [place], [place], [address] Phone: [phone]   Fax:  [phone]  Name: Robert Mcconnell MRN: [id number] Date of Birth: 1922-04-26

## 2019-10-09 NOTE — Patient Instructions (Signed)
Speech Exercises: 2-3 times every day  Push down on your chair while you say "Ahhhh!" as loud and as long as you can. 5x Pull up on the handle of your chair while you say "Ahhhhh!" as loud and as long as you can. 5x Shout "Hey You!" 5 x    "I need help!" "I want water!" "I'm hungry!" Jule Ser! Edwena Blow! Momma! "Wipe my mouth" ([name] Lelon Frohlich can add more words to this list. Think about favorite foods, friends/family. Try to stick to high frequency words)  Brush your teeth and all surfaces of your mouth before and after you eat.  Signs of Aspiration Pneumonia   . Chest pain/tightness . Fever (can be low grade) . Cough  o With foul-smelling phlegm (sputum) o With sputum containing pus or blood o With greenish sputum . Fatigue  . Shortness of breath  . Wheezing   **IF YOU HAVE THESE SIGNS, CONTACT YOUR DOCTOR OR GO TO THE EMERGENCY DEPARTMENT OR URGENT CARE AS SOON AS POSSIBLE**

## 2019-10-09 NOTE — Therapy (Signed)
[facility] [address] [place], [place], [address] Phone: [phone]   Fax:  [phone]  Speech Language Pathology Treatment  Patient Details  Name: Robert Mcconnell MRN: [id number] Date of Birth: December 09, 1921 Referring Provider (SLP): Wynema Birch, [place]   Encounter Date: 10/09/2019  End of Session - 10/09/19 1806    Visit Number  2    Number of Visits  15    Date for SLP Re-Evaluation  12/05/19    Authorization Type  VA    Authorization - Visit Number  2    Authorization - Number of Visits  15    SLP Start Time  9622    SLP Stop Time   1745    SLP Time Calculation (min)  40 min    Activity Tolerance  Patient tolerated treatment well       Past Medical History:  Diagnosis Date  . Stroke [facility])     Past Surgical History:  Procedure Laterality Date  . COLECTOMY     cancer    There were no vitals filed for this visit.  Subjective Assessment - 10/09/19 1757    Subjective  Pt did not initially respond when greeted by SLP    Currently in Pain?  No/denies            ADULT SLP TREATMENT - 10/09/19 1757      General Information   Behavior/Cognition  Requires cueing;Decreased sustained attention;Alert    Patient Positioning  Upright in chair      Treatment Provided   Treatment provided  Cognitive-Linquistic;Dysphagia      Dysphagia Treatment   Temperature Spikes Noted  No    Respiratory Status  Room air    Oral Cavity - Dentition  Adequate natural dentition   thrush   Treatment Methods  Patient/caregiver education    Patient observed directly with PO's  No   do not wish to work on swallowing   Other treatment/comments  daughter reports pt was drinking thickener; did vital stim with HH SLP. She/pt electing for pt to drink thin liquids due to kidney issues as pt was not maintaining adequate hydration with thin liquids. Daughter is EMT and has been listening to pt's lung sounds regularly. Reports some  throat clearing with swallowing. SLP educated re: risks of aspiration PNA and s/sx to monitor. Advised strict oral care of all surfaces of pt's mouth before and after meals. If pt had objective testing this record is not available to SLP; this may be considered depending on pt tolerance of his selected diet and if deemed appropriate in the context of pt wishes/quality of life.       Pain Assessment   Pain Assessment  No/denies pain      Cognitive-Linquistic Treatment   Treatment focused on  Dysarthria    Skilled Treatment  Daughter reports pt has been more "responsive" lately; he had dysarthria HEP from home health but is "tired of this" (oral motor exercises and multisyllabic words). Pt intelligibility to SLP was approximately 25%; most impacted by sub-WNL vocal intensity. Worked with pt and daughter to generate dysarthria HEP with focus on personally relevant phrases and intensity-based exercises. Pt required ususal mod-max A with this. Has an aide who per daughter works diligently to complete programs with pt at home. See pt instructions for details.      Assessment / Recommendations / Plan   Plan  Continue with current plan of care      Progression Toward Goals  Progression toward goals  Progressing toward goals       SLP Education - 10/09/19 1806    Education Details  dysarthria HEP, risks and signs of aspiration PNA    Person(s) Educated  Patient;Child(ren)    Methods  Explanation;Demonstration;Verbal cues;Handout    Comprehension  Verbalized understanding;Verbal cues required;Need further instruction       SLP Short Term Goals - 10/09/19 1810      SLP SHORT TERM GOAL #1   Title  pt will demo sustained attention for a 5- minute familiar task x5, in 3 sessions    Time  4    Period  Weeks    Status  On-going      SLP SHORT TERM GOAL #2   Title  pt will demo knowledge of a memory book when asked pertinent questions dealing with memory in 3 sessions    Time  4    Period  Weeks     Status  On-going      SLP SHORT TERM GOAL #3   Title  Pt will complete dysarthria HEP with usual mod cues from caregiver x3 sessions.    Time  4    Period  Weeks    Status  New       SLP Long Term Goals - 10/09/19 1814      SLP LONG TERM GOAL #1   Title  pt will demo sustained attention for 3 10 minute therapy tasks in 3 sessions    Time  7    Period  Weeks   or 15 total visits, for all LTGs   Status  New      SLP LONG TERM GOAL #2   Title  pt will engage with memory notebook/planner, WNL procedure, with min nonverbal cues to do so, in 3 sessions    Time  7    Period  Weeks    Status  New      SLP LONG TERM GOAL #3   Title  Pt/family will tell SLP 3 signs of aspiration PNA.    Time  7    Period  Weeks    Status  New       Plan - 10/09/19 1807    Clinical Impression Statement  Pt presents with significant deficits in cognitive linguistics incluing attention, memory, awareness. SLP judges moderate-severe dysarthria today; intelligibility ~25%. Pt with possibly expressive/receptive aphasia however he is also extremely hard of hearing. Clear face mask and written cues were helpful for comprehension. Pt with multi-factorial sx making improvement in speech and language difficult but SLP; attention today was adequate for pt to participate in simple functional speech tasks with mod-max cues. Pt may be able to benefit from skilled ST - if pt skills remain almost identical to eval pt may be discharged and re-evaled when pt attention becomes more functional for speech therapy setting.    Speech Therapy Frequency  2x / week    Duration  --   7 weeks (or 15 visits - VA auth)   Treatment/Interventions  Environmental controls;Oral motor exercises;Functional tasks;Multimodal communcation approach;Cueing hierarchy;Trials of upgraded texture/liquids;Diet toleration management by SLP;SLP instruction and feedback;Language facilitation;Cognitive reorganization;Compensatory  strategies;Patient/family education;Internal/external aids    Potential to Achieve Goals  Fair    Potential Considerations  Severity of impairments    Consulted and Agree with Plan of Care  Patient;Family member/caregiver    Family Member Consulted  son in law       Patient will benefit from skilled therapeutic  intervention in order to improve the following deficits and impairments:   Dysarthria and anarthria  Dysphagia, unspecified type  Cognitive communication deficit    Problem List Patient Active Problem List   Diagnosis Date Noted  . CKD (chronic kidney disease) stage 4, GFR 15-29 ml/min (HCC) 09/29/2019  . Thrombocytopenia ([place]) 09/29/2019  . Personal history of DVT (deep vein thrombosis) 09/09/2019  . History of ischemic stroke 09/09/2019  . CAD (coronary artery disease) 09/09/2019  . Chronic constipation 09/09/2019  . History of colorectal cancer 09/09/2019  . 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 Right arm weakness 09/09/2019  . Right leg weakness 09/09/2019  . 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 [place], [place], [address] Phone: [phone]   Fax:  [phone]   Name: Arieon Corcoran MRN: [id number] Date of Birth: [birth year]

## 2019-10-09 NOTE — Therapy (Signed)
[facility] [address] [place], [place], [address] Phone: [phone]   Fax:  [phone]  Occupational Therapy Treatment  Patient Details  Name: Robert Mcconnell MRN: [id number] Date of Birth: 1921/09/24 Referring Provider (OT): Eliezer Lofts   Encounter Date: 10/09/2019  OT End of Session - 10/09/19 1849    Visit Number  3    Number of Visits  13    Date for OT Re-Evaluation  11/24/19    Authorization Type  [place]    Authorization - Visit Number  3    Authorization - Number of Visits  15    OT Start Time  0254    OT Stop Time  1830    OT Time Calculation (min)  45 min    Activity Tolerance  Patient tolerated treatment well    Behavior During Therapy  [facility] for tasks assessed/performed       Past Medical History:  Diagnosis Date  . Stroke [facility])     Past Surgical History:  Procedure Laterality Date  . COLECTOMY     cancer    There were no vitals filed for this visit.  Subjective Assessment - 10/09/19 1840    Subjective   My balance is not good    Patient is accompanied by:  Family member    Currently in Pain?  No/denies    Multiple Pain Sites  No                   OT Treatments/Exercises (OP) - 10/09/19 0001      Neurological Re-education Exercises   Other Exercises 1  Neuromuscular reeducation to address postural control in sitting.  Patient with improved ability to weight shift forward and to right side today.  Patient continues with pushing behaviors in transitioning from sitting to standing.  Patient able to maintain sitting balance with weight shifted toward left side, and when placed on a moveable surface able to flex and extens shoulder and elbow.  Initially movement occured as product of body movement, but with stabilization and cueing to attend to left arm, able to build sufficient tension to move arm toward flexion and extension and change directions on command. Worked on sit to partial  stand transitions with weight biased toward right side to reduce pushing toward left.  Patient able to participate in full 45 min session after two prior therapy sessions.                OT Education - 10/09/19 1848    Education Details  Talked with family about having him begin to slide right hand back and forth over thigh    Person(s) Educated  Patient;Child(ren)    Methods  Explanation;Demonstration    Comprehension  Verbalized understanding;Returned demonstration;Need further instruction       OT Short Term Goals - 09/25/19 1908      OT SHORT TERM GOAL #1   Title  Patient will complete lower body bathing with max assist due 6/19    Baseline  total assist    Time  4    Period  Weeks    Status  New    Target Date  10/25/19      OT SHORT TERM GOAL #2   Title  Patient will bend forward while seated to pull pants up thighs    Baseline  not able    Time  4    Period  Weeks    Status  New  OT SHORT TERM GOAL #3   Title  Patient will transition from sit to stand with mod assist in prep for tolieting on commode    Baseline  total assist    Time  4    Period  Weeks    Status  New      OT SHORT TERM GOAL #4   Title  Patient/caregiver will don/doff custom RUE splint with min cueing    Time  4    Period  Weeks    Status  New        OT Long Term Goals - 09/25/19 1912      OT LONG TERM GOAL #1   Title  Patient will transiton from sit to stand with mod assist to allow toileting, clothing management, and hygiene    Time  6    Period  Weeks    Status  New    Target Date  11/24/19      OT LONG TERM GOAL #2   Title  Patient/caregiver will complete a home exercise program to prevent pain, and improve passive motion in RUE to aide with hygiene and dressing tasks    Baseline  Patient with pain throughout RUE    Time  6    Period  Weeks    Status  New      OT LONG TERM GOAL #3   Title  Patient / caregiver will demonstrate effective edema management techniques for  right hand, and support for right arm to prevent further subluxation    Time  6    Period  Weeks    Status  New      OT LONG TERM GOAL #4   Title  Patient will attend to right arm, and manage right arm during transitional movements and in wheelchair with min cueing    Time  6    Period  Weeks    Status  New            Plan - 10/09/19 1849    Clinical Impression Statement  Patient showing improvement with postural control and ability to weight shift forward.  Daughter reports less resistance with functional transfers at home    OT Frequency  2x / week    OT Duration  6 weeks    OT Treatment/Interventions  Self-care/ADL training;Electrical Stimulation;Therapeutic exercise;Visual/perceptual remediation/compensation;Patient/family education;Splinting;Neuromuscular education;Therapeutic activities;Balance training;Cognitive remediation/compensation;Passive range of motion;Manual Therapy;DME and/or AE instruction    Plan  Postural control, sitting balance, sit to lift off, weight shift right, lower body ADL weight shifts, NMR RUE    OT Home Exercise Plan  SLiding right hand on thigh, tilted stool    Consulted and Agree with Plan of Care  Patient;Family member/caregiver       Patient will benefit from skilled therapeutic intervention in order to improve the following deficits and impairments:           Visit Diagnosis: Hemiplegia and hemiparesis following cerebral infarction affecting right dominant side (HCC)  Muscle weakness (generalized)  Unsteadiness on feet  Stiffness of right shoulder, not elsewhere classified  Localized edema  Attention and concentration deficit  Neurologic neglect syndrome    Problem List Patient Active Problem List   Diagnosis Date Noted  . CKD (chronic kidney disease) stage 4, GFR 15-29 ml/min (HCC) 09/29/2019  . Thrombocytopenia ([place]) 09/29/2019  . Personal history of DVT (deep vein thrombosis) 09/09/2019  . History of ischemic stroke  09/09/2019  . CAD (coronary artery disease) 09/09/2019  . Chronic constipation  09/09/2019  . History of colorectal cancer 09/09/2019  . 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 Right arm weakness 09/09/2019  . Right leg weakness 09/09/2019  . 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 [place], [place], [address] Phone: [phone]   Fax:  [phone]  Name: Lynn Sissel MRN: [id number] Date of Birth: [birth year]

## 2019-10-13 ENCOUNTER — Ambulatory Visit: Payer: No Typology Code available for payment source

## 2019-10-13 ENCOUNTER — Encounter: Payer: Self-pay | Admitting: Speech Pathology

## 2019-10-13 ENCOUNTER — Other Ambulatory Visit: Payer: Self-pay

## 2019-10-13 DIAGNOSIS — R471 Dysarthria and anarthria: Secondary | ICD-10-CM

## 2019-10-13 DIAGNOSIS — R2681 Unsteadiness on feet: Secondary | ICD-10-CM

## 2019-10-13 DIAGNOSIS — R41841 Cognitive communication deficit: Secondary | ICD-10-CM

## 2019-10-13 DIAGNOSIS — M6281 Muscle weakness (generalized): Secondary | ICD-10-CM

## 2019-10-13 DIAGNOSIS — I69351 Hemiplegia and hemiparesis following cerebral infarction affecting right dominant side: Secondary | ICD-10-CM | POA: Diagnosis not present

## 2019-10-13 DIAGNOSIS — R1310 Dysphagia, unspecified: Secondary | ICD-10-CM

## 2019-10-13 DIAGNOSIS — R4701 Aphasia: Secondary | ICD-10-CM

## 2019-10-13 NOTE — Therapy (Signed)
[facility] [address] [place], [place], [address] Phone: [phone]   Fax:  [phone]  Physical Therapy Treatment  Patient Details  Name: Robert Mcconnell MRN: [id number] Date of Birth: 08-12-1921 Referring Provider (PT): Arnell Sieving   Encounter Date: 10/13/2019  PT End of Session - 10/13/19 1659    Visit Number  11    Number of Visits  17    Date for PT Re-Evaluation  11/03/19    Authorization Type  [facility]    Authorization Time Period  09/04/19 to 01/02/20    Authorization - Visit Number  9    Authorization - Number of Visits  15    Progress Note Due on Visit  15    PT Start Time  1443    PT Stop Time  1700    PT Time Calculation (min)  45 min    Equipment Utilized During Treatment  Gait belt    Activity Tolerance  Patient tolerated treatment well    Behavior During Therapy  [facility] for tasks assessed/performed       Past Medical History:  Diagnosis Date  . Stroke [facility])     Past Surgical History:  Procedure Laterality Date  . COLECTOMY     cancer    There were no vitals filed for this visit.  Subjective Assessment - 10/13/19 1652    Subjective  No new complaints.    Patient is accompained by:  Family member    Pertinent History  CVA    Limitations  Standing;Walking;House hold activities;Sitting    How long can you sit comfortably?  1-2 hours    How long can you stand comfortably?  unable    How long can you walk comfortably?  unable              Therapeutic activity: Sit to stand: 10x partial sit downs with one hand rail assist and min A from PT Sit to stand with cues to push off from arm of the chair: 10x Standing 3 x 2', practiced shifring weight to left side with max cueing, mod A to block the knee on R, pt asked to turn and look to his left and to the right, tell time, count carts etc. While he was standing Standing without extenral support: 3 x 5" holds Practiced scooting back in  chair: cues to lean forward, approximation at knees to push down on heel: 3x                    PT Short Term Goals - 09/29/19 1843      PT SHORT TERM GOAL #1   Title  Patient will be able to perform sit to stand with mod A with or without AD to improve independence and reduce care giver burden    Baseline  max A    Time  4    Period  Weeks    Status  New    Target Date  10/06/19      PT SHORT TERM GOAL #2   Title  pt will be able to stand with mod A for 5 min to perform small reaching with L UE to improve standing balance.    Baseline  requires max A for standing, unable to reach    Time  4    Period  Weeks    Status  New    Target Date  10/06/19      PT SHORT TERM GOAL #3  Title  Pt will be able to propel chair with SBA  for 30 feet to improve household navigation    Baseline  total A with wheelchair propulsion    Time  4    Period  Weeks    Status  New    Target Date  10/06/19        PT Long Term Goals - 09/29/19 1843      PT LONG TERM GOAL #1   Title  Patient will be able to stand for 5 min with CGA to improve standing endurance with or without AD    Baseline  requires max A    Time  8    Period  Weeks    Status  New      PT LONG TERM GOAL #2   Title  Pt will require CGA with sit to stand and chair to chair transfers to improve indepdnence and reduce caregiver burden.    Baseline  max to total A    Time  8    Period  Weeks    Status  New      PT LONG TERM GOAL #3   Title  Patient will be able to walk into walk-in shower and sit on shower chair with CGA to improve independence.    Baseline  bed sponge bath only    Time  8    Period  Weeks    Status  New      PT LONG TERM GOAL #4   Title  Pt will demo >10/56 on BBS to improve static balance and reduce fall risk    Baseline  BBS 4/56 (eval)    Time  8    Period  Weeks    Status  New            Plan - 10/13/19 1653    Clinical Impression Statement  Pt able to lean forward and  push back with scooting back in the wheelchair today. He required mod A with scooting. Pt able to maintain standing posture with excessive knee flexion and PT blocking R knee for about 15-20 seconds. Pt requires max cueing to stand up straight and lean to left side.    Personal Factors and Comorbidities  Age;Comorbidity 3+    Comorbidities  hx of stroke, hx of colorectal cancer    Examination-Activity Limitations  Bathing;Bed Mobility;Caring for Others;Carry;Locomotion Level;Hygiene/Grooming;Dressing;Continence;Self Feeding;Reach Overhead;Sit;Lift;Other;Transfers;Toileting;Stand;Stairs    Examination-Participation Restrictions  Church;Cleaning;Community Activity;Driving;Meal Prep;Volunteer;Shop;Medication Management;Laundry;Personal Finances;Yard Work;Interpersonal Relationship    Stability/Clinical Decision Making  Unstable/Unpredictable    Rehab Potential  Good    PT Frequency  2x / week    PT Duration  6 weeks    PT Treatment/Interventions  ADLs/Self Care Home Management;Electrical Stimulation;Gait training;Stair training;Functional mobility training;Therapeutic exercise;Balance training;Neuromuscular re-education;Therapeutic activities;Patient/family education;Wheelchair mobility training;Manual techniques;Passive range of motion    PT Next Visit Plan  continue to work on sit to stand transfers, chair to chair transfers, wheelchair mobility       Patient will benefit from skilled therapeutic intervention in order to improve the following deficits and impairments:  Abnormal gait, Decreased activity tolerance, Decreased coordination, Decreased safety awareness, Decreased strength, Impaired flexibility, Impaired UE functional use, Postural dysfunction, Impaired tone, Increased edema, Decreased range of motion, Decreased knowledge of precautions, Impaired sensation, Difficulty walking, Decreased mobility, Decreased endurance, Decreased balance  Visit Diagnosis: Hemiplegia and hemiparesis following  cerebral infarction affecting right dominant side (HCC)  Muscle weakness (generalized)  Unsteadiness on feet     Problem List  Patient Active Problem List   Diagnosis Date Noted  . CKD (chronic kidney disease) stage 4, GFR 15-29 ml/min (HCC) 09/29/2019  . Thrombocytopenia ([place]) 09/29/2019  . Personal history of DVT (deep vein thrombosis) 09/09/2019  . History of ischemic stroke 09/09/2019  . CAD (coronary artery disease) 09/09/2019  . Chronic constipation 09/09/2019  . History of colorectal cancer 09/09/2019  . 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 Right arm weakness 09/09/2019  . Right leg weakness 09/09/2019  . 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

## 2019-10-13 NOTE — Therapy (Signed)
[name] [address] [place], [place], [address] Phone: [phone]   Fax:  [phone]  Speech Language Pathology Treatment  Patient Details  Name: Robert Mcconnell MRN: [id number] Date of Birth: 1921-07-13 Referring Provider (SLP): Wynema Birch, [place]   Encounter Date: 10/13/2019  End of Session - 10/13/19 1651    Visit Number  3    Number of Visits  15    Date for SLP Re-Evaluation  12/05/19    Authorization Type  [facility]    Authorization - Visit Number  3    Authorization - Number of Visits  15    SLP Start Time  1528    SLP Stop Time   1613    SLP Time Calculation (min)  45 min    Activity Tolerance  Patient tolerated treatment well       Past Medical History:  Diagnosis Date   Stroke [facility])     Past Surgical History:  Procedure Laterality Date   COLECTOMY     cancer    There were no vitals filed for this visit.   ADULT SLP TREATMENT - 10/13/19 1543      General Information   Behavior/Cognition  Requires cueing;Decreased sustained attention;Alert      Treatment Provided   Treatment provided  Cognitive-Linquistic      Dysphagia Treatment   Other treatment/comments  SLP educated pt/wife on necessity for completing oral care after each meal, and SLP explained rationale for this. SLP confirmed that pt was on thin liquids and if this was pt choice dtr should cont to monitor pt lung sounds (dtr is EMT).       Cognitive-Linquistic Treatment   Treatment focused on  Dysarthria    Skilled Treatment  Wife has been present at home since last ST session and does not recall pt completing "AH" or sentences since last ST session. Pt/family did not bring pt's dysarthria HEP with him today. SLP encouraged wife to write down more sentences pt would say on a daily or semi-daily basis in order to have practice be as salient as possible. SLP conducted pain survey 85-90% with yes/no questions due to pt decr'd  intelligibility. Pt sustained attention during HEP for dysarthria was short however manageable for repeating target stimuli to improve pt loudness and intelligibility. SLP req'd to use max cues consistently (written, model, visual/gestural) with pt for "ah" portion of his HEP for loudness. Wife completed the "Hey You" and sentences with pt with better frequency of target response from pt than pt generated with SLP. SLP encourgaed pt wife to complete HEP with pt x3/day - wife stated after breakfast, after lunch, and after dinner could work - SLP suggested pt possibly complete HEP prior to dinner, dependning on pt fatigue level. SLP worked with pt on "Devol!" with written cues and model and pt loudness, and thus articulation, improved. SLP was very forthright about the amount of pt practice at home wtih HEP will dictate how much progress he makes with speech intelligibility.       Assessment / Recommendations / Plan   Plan  Continue with current plan of care;Goals updated      Progression Toward Goals   Progression toward goals  Progressing toward goals             SLP Education - 10/13/19 1649    Education Details  pt success with speech intelligibilty his highly dependent upon pt completing dysarthria HEP consistently, dysarthria HEP procedure (with pt  wife)    Person(s) Educated  Patient;Spouse    Methods  Explanation;Demonstration;Verbal cues;Handout    Comprehension  Verbalized understanding;Returned demonstration;Verbal cues required;Need further instruction       SLP Short Term Goals - 10/13/19 1648      SLP SHORT TERM GOAL #1   Title  pt will demo sustained attention for a 5- minute familiar task x5, in 3 sessions    Time  3    Period  Weeks    Status  On-going      SLP SHORT TERM GOAL #2   Title  pt will demo knowledge of a memory book when asked pertinent questions dealing with memory in 3 sessions    Time  3    Period  Weeks    Status  On-going      SLP SHORT TERM  GOAL #3   Title  Pt will complete dysarthria HEP with usual mod cues from caregiver x3 sessions.    Time  3    Period  Weeks    Status  On-going       SLP Long Term Goals - 10/13/19 1648      SLP LONG TERM GOAL #1   Title  pt will demo sustained attention for 3 10 minute therapy tasks in 3 sessions    Time  7    Period  Weeks   or 15 total visits, for all LTGs   Status  On-going      SLP LONG TERM GOAL #2   Title  pt will engage with memory notebook/planner, WNL procedure, with min nonverbal cues to do so, in 3 sessions    Time  7    Period  Weeks    Status  On-going      SLP LONG TERM GOAL #3   Title  Pt/family will tell SLP 3 signs of aspiration PNA.    Time  7    Period  Weeks    Status  On-going       Plan - 10/13/19 1647    Clinical Impression Statement  Pt presents with significant deficits in cognitive linguistics incluing attention, memory, awareness. SLP judges moderate-severe dysarthria today; intelligibility ~25%. Pt with possibly expressive/receptive aphasia however he is also extremely hard of hearing, and this is very difficult to ascertain at this time due to pt's severe dysarthria. SLP again used clear face mask and written cues for comprehension. Pt with multi-factorial sx making improvement in speech and language difficult but SLP; attention today was adequate for pt to participate in simple functional speech tasks with mod-max cues. Pt may be able to benefit from skilled ST - if pt skills remain almost identical to eval pt may be discharged and re-evaled when pt attention becomes more functional for speech therapy setting.    Speech Therapy Frequency  2x / week    Duration  --   7 weeks (or 15 visits - VA auth)   Treatment/Interventions  Environmental controls;Oral motor exercises;Functional tasks;Multimodal communcation approach;Cueing hierarchy;Trials of upgraded texture/liquids;Diet toleration management by SLP;SLP instruction and feedback;Language  facilitation;Cognitive reorganization;Compensatory strategies;Patient/family education;Internal/external aids    Potential to Achieve Goals  Fair    Potential Considerations  Severity of impairments    Consulted and Agree with Plan of Care  Patient;Family member/caregiver    Family Member Consulted  son in law       Patient will benefit from skilled therapeutic intervention in order to improve the following deficits and impairments:   Dysarthria and  anarthria  Dysphagia, unspecified type  Cognitive communication deficit  Aphasia    Problem List Patient Active Problem List   Diagnosis Date Noted   CKD (chronic kidney disease) stage 4, GFR 15-29 ml/min (HCC) 09/29/2019   Thrombocytopenia ([place]) 09/29/2019   Personal history of DVT (deep vein thrombosis) 09/09/2019   History of ischemic stroke 09/09/2019   CAD (coronary artery disease) 09/09/2019   Chronic constipation 09/09/2019   History of colorectal cancer 09/09/2019   Facial paralysis on right side 09/09/2019   Right arm weakness 09/09/2019   Right leg weakness 09/09/2019   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

## 2019-10-16 ENCOUNTER — Other Ambulatory Visit: Payer: Self-pay

## 2019-10-16 ENCOUNTER — Ambulatory Visit: Payer: No Typology Code available for payment source

## 2019-10-16 ENCOUNTER — Encounter: Payer: Self-pay | Admitting: Occupational Therapy

## 2019-10-16 ENCOUNTER — Ambulatory Visit: Payer: No Typology Code available for payment source | Admitting: Occupational Therapy

## 2019-10-16 ENCOUNTER — Ambulatory Visit: Payer: No Typology Code available for payment source | Admitting: Speech Pathology

## 2019-10-16 DIAGNOSIS — R2681 Unsteadiness on feet: Secondary | ICD-10-CM

## 2019-10-16 DIAGNOSIS — I69351 Hemiplegia and hemiparesis following cerebral infarction affecting right dominant side: Secondary | ICD-10-CM

## 2019-10-16 DIAGNOSIS — M6281 Muscle weakness (generalized): Secondary | ICD-10-CM

## 2019-10-16 DIAGNOSIS — R471 Dysarthria and anarthria: Secondary | ICD-10-CM

## 2019-10-16 DIAGNOSIS — R414 Neurologic neglect syndrome: Secondary | ICD-10-CM

## 2019-10-16 DIAGNOSIS — M25611 Stiffness of right shoulder, not elsewhere classified: Secondary | ICD-10-CM

## 2019-10-16 DIAGNOSIS — R600 Localized edema: Secondary | ICD-10-CM

## 2019-10-16 DIAGNOSIS — R41840 Attention and concentration deficit: Secondary | ICD-10-CM

## 2019-10-16 DIAGNOSIS — R41841 Cognitive communication deficit: Secondary | ICD-10-CM

## 2019-10-16 NOTE — Therapy (Signed)
[facility] [address] [place], [place], [address] Phone: [phone]   Fax:  [phone]  Occupational Therapy Treatment  Patient Details  Name: Robert Mcconnell MRN: [id number] Date of Birth: 09/14/1921 Referring Provider (OT): Eliezer Lofts   Encounter Date: 10/16/2019   OT End of Session - 10/16/19 1916    Visit Number 4    Number of Visits 13    Date for OT Re-Evaluation 11/24/19    Authorization Type [facility]    Authorization - Visit Number 4    Authorization - Number of Visits 15    OT Start Time 1700    OT Stop Time 1745    OT Time Calculation (min) 45 min    Activity Tolerance Patient tolerated treatment well    Behavior During Therapy [facility] for tasks assessed/performed           Past Medical History:  Diagnosis Date  . Stroke [facility])     Past Surgical History:  Procedure Laterality Date  . COLECTOMY     cancer    There were no vitals filed for this visit.   Subjective Assessment - 10/16/19 1907    Subjective  It's too much.  (Patient fatigued after SLP/PT sessions)    Patient is accompanied by: Family member    Currently in Pain? No/denies                        OT Treatments/Exercises (OP) - 10/16/19 0001      Neurological Re-education Exercises   Other Exercises 1 Neuromuscular reeducation to address level surface transfers to right and to left with patient more actively flexing forward and shifting mass opposite hips.  Worked on rolling toward right side, then accepting weight through right extremities.  Patient showing some ability to activate right arm into surface to help transition toward sidelying.  In supine - drop and catch to facilitate shoulder and elbow activation.  Patient unabe to sustain contraction but in forced use situation able to activate briefly.  Worked on rolling toward left side activating right trunk.      Other Exercises 2 In sitting worked on biasing weight  toward right side and faciltiating visual attention to right arm.  Placed arm on moveable surface and patient able to flex / ext shoulder.  Daughter present and doing an excellent job implementing therapeutic concepts in home setting.                     OT Short Term Goals - 10/16/19 1918      OT SHORT TERM GOAL #1   Title Patient will complete lower body bathing with max assist due 6/19    Status On-going      OT SHORT TERM GOAL #2   Title Patient will bend forward while seated to pull pants up thighs    Status On-going      OT SHORT TERM GOAL #3   Title Patient will transition from sit to stand with mod assist in prep for tolieting on commode    Status On-going      OT SHORT TERM GOAL #4   Title Patient/caregiver will don/doff custom RUE splint with min cueing    Status On-going             OT Long Term Goals - 09/25/19 1912      OT LONG TERM GOAL #1   Title Patient will transiton from sit to stand  with mod assist to allow toileting, clothing management, and hygiene    Time 6    Period Weeks    Status New    Target Date 11/24/19      OT LONG TERM GOAL #2   Title Patient/caregiver will complete a home exercise program to prevent pain, and improve passive motion in RUE to aide with hygiene and dressing tasks    Baseline Patient with pain throughout RUE    Time 6    Period Weeks    Status New      OT LONG TERM GOAL #3   Title Patient / caregiver will demonstrate effective edema management techniques for right hand, and support for right arm to prevent further subluxation    Time 6    Period Weeks    Status New      OT LONG TERM GOAL #4   Title Patient will attend to right arm, and manage right arm during transitional movements and in wheelchair with min cueing    Time 6    Period Weeks    Status New                 Plan - 10/16/19 1917    Clinical Impression Statement Patient showing imprved attention (seconds) and improved activity tolerance  - tolerating three therapies concurrently    OT Frequency 2x / week    OT Duration 6 weeks    OT Treatment/Interventions Self-care/ADL training;Electrical Stimulation;Therapeutic exercise;Visual/perceptual remediation/compensation;Patient/family education;Splinting;Neuromuscular education;Therapeutic activities;Balance training;Cognitive remediation/compensation;Passive range of motion;Manual Therapy;DME and/or AE instruction    Plan Resting hand splint?  Postural control, sitting balance, sit to lift off, weight shift right, lower body ADL weight shifts, NMR RUE    Consulted and Agree with Plan of Care Patient;Family member/caregiver    Family Member Consulted dtr [name]           Patient will benefit from skilled therapeutic intervention in order to improve the following deficits and impairments:           Visit Diagnosis: Muscle weakness (generalized)  Unsteadiness on feet  Stiffness of right shoulder, not elsewhere classified  Localized edema  Attention and concentration deficit  Neurologic neglect syndrome    Problem List Patient Active Problem List   Diagnosis Date Noted  . CKD (chronic kidney disease) stage 4, GFR 15-29 ml/min (HCC) 09/29/2019  . Thrombocytopenia ([place]) 09/29/2019  . Personal history of DVT (deep vein thrombosis) 09/09/2019  . History of ischemic stroke 09/09/2019  . CAD (coronary artery disease) 09/09/2019  . Chronic constipation 09/09/2019  . History of colorectal cancer 09/09/2019  . 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 Right arm weakness 09/09/2019  . Right leg weakness 09/09/2019  . 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 [place], [place], [address] Phone: [phone]   Fax:  [phone]  Name: Robert Mcconnell MRN: [id number] Date of Birth: 08-16-1921

## 2019-10-16 NOTE — Therapy (Signed)
[facility] [address] [place], [place], [address] Phone: [phone]   Fax:  [phone]  Physical Therapy Treatment  Patient Details  Name: Robert Mcconnell MRN: [id number] Date of Birth: [birth year] Referring Provider (PT): Arnell Sieving   Encounter Date: 10/16/2019   PT End of Session - 10/16/19 1852    Visit Number 12    Number of Visits 17    Date for PT Re-Evaluation 11/03/19    Authorization Type VA    Authorization Time Period 09/04/19 to 01/02/20    Authorization - Visit Number 9    Authorization - Number of Visits 15    Progress Note Due on Visit 15    PT Start Time 4008    PT Stop Time 1700    PT Time Calculation (min) 45 min    Equipment Utilized During Treatment Gait belt    Activity Tolerance Patient tolerated treatment well    Behavior During Therapy [facility] for tasks assessed/performed           Past Medical History:  Diagnosis Date  . Stroke [facility])     Past Surgical History:  Procedure Laterality Date  . COLECTOMY     cancer    There were no vitals filed for this visit.   Subjective Assessment - 10/16/19 1849    Subjective Daughter reports that practice sit to stand and standing at the countertop everyday.    Patient is accompained by: Family member    Pertinent History CVA    Limitations Standing;Walking;House hold activities;Sitting    How long can you sit comfortably? 1-2 hours    How long can you stand comfortably? unable    How long can you walk comfortably? unable                  Treatment:  Sit to stand transfers: max A x 2 due to increased knee pain, total 5x Partial squats: mod A with use of parallel bars for support with 1 hand: 10x Lateral steps: 3 x 4-5 steps each way max A x 2, with knee immobilizer on L KNee Fwd steps: 5 feet with max A x 2 with L knee immobilizer on L knee                     PT Short Term Goals - 09/29/19 1843       PT SHORT TERM GOAL #1   Title Patient will be able to perform sit to stand with mod A with or without AD to improve independence and reduce care giver burden    Baseline max A    Time 4    Period Weeks    Status New    Target Date 10/06/19      PT SHORT TERM GOAL #2   Title pt will be able to stand with mod A for 5 min to perform small reaching with L UE to improve standing balance.    Baseline requires max A for standing, unable to reach    Time 4    Period Weeks    Status New    Target Date 10/06/19      PT SHORT TERM GOAL #3   Title Pt will be able to propel chair with SBA  for 30 feet to improve household navigation    Baseline total A with wheelchair propulsion    Time 4    Period Weeks    Status New    Target  Date 10/06/19             PT Long Term Goals - 09/29/19 1843      PT LONG TERM GOAL #1   Title Patient will be able to stand for 5 min with CGA to improve standing endurance with or without AD    Baseline requires max A    Time 8    Period Weeks    Status New      PT LONG TERM GOAL #2   Title Pt will require CGA with sit to stand and chair to chair transfers to improve indepdnence and reduce caregiver burden.    Baseline max to total A    Time 8    Period Weeks    Status New      PT LONG TERM GOAL #3   Title Patient will be able to walk into walk-in shower and sit on shower chair with CGA to improve independence.    Baseline bed sponge bath only    Time 8    Period Weeks    Status New      PT LONG TERM GOAL #4   Title Pt will demo >10/56 on BBS to improve static balance and reduce fall risk    Baseline BBS 4/56 (eval)    Time 8    Period Weeks    Status New                 Plan - 10/16/19 1850    Clinical Impression Statement Pt's sit to stand, standing and ambulation tolerance was limited due to L knee pain today. Pt was able to shuffle left foot with lateral walking but still required total A with R LE with lateral and fwd walking.  Patient required max A x 2 with lateral walking and fwd walking. Pt unable to push off from chair for sit to stand transfers most likely due to increased L knee pain.    Personal Factors and Comorbidities Age;Comorbidity 3+    Comorbidities hx of stroke, hx of colorectal cancer    Examination-Activity Limitations Bathing;Bed Mobility;Caring for Others;Carry;Locomotion Level;Hygiene/Grooming;Dressing;Continence;Self Feeding;Reach Overhead;Sit;Lift;Other;Transfers;Toileting;Stand;Stairs    Examination-Participation Restrictions Church;Cleaning;Community Activity;Driving;Meal Prep;Volunteer;Shop;Medication Management;Laundry;Personal Finances;Yard Work;Interpersonal Relationship    Stability/Clinical Decision Making Unstable/Unpredictable    Rehab Potential Good    PT Frequency 2x / week    PT Duration 6 weeks    PT Treatment/Interventions ADLs/Self Care Home Management;Electrical Stimulation;Gait training;Stair training;Functional mobility training;Therapeutic exercise;Balance training;Neuromuscular re-education;Therapeutic activities;Patient/family education;Wheelchair mobility training;Manual techniques;Passive range of motion    PT Next Visit Plan continue to work on sit to stand transfers, chair to chair transfers, wheelchair mobility           Patient will benefit from skilled therapeutic intervention in order to improve the following deficits and impairments:  Abnormal gait, Decreased activity tolerance, Decreased coordination, Decreased safety awareness, Decreased strength, Impaired flexibility, Impaired UE functional use, Postural dysfunction, Impaired tone, Increased edema, Decreased range of motion, Decreased knowledge of precautions, Impaired sensation, Difficulty walking, Decreased mobility, Decreased endurance, Decreased balance  Visit Diagnosis: Muscle weakness (generalized)  Unsteadiness on feet  Hemiplegia and hemiparesis following cerebral infarction affecting right dominant  side [facility])     Problem List Patient Active Problem List   Diagnosis Date Noted  . CKD (chronic kidney disease) stage 4, GFR 15-29 ml/min (HCC) 09/29/2019  . Thrombocytopenia ([place]) 09/29/2019  . Personal history of DVT (deep vein thrombosis) 09/09/2019  . History of ischemic stroke 09/09/2019  . CAD (coronary artery disease) 09/09/2019  .  Chronic constipation 09/09/2019  . History of colorectal cancer 09/09/2019  . 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 Right arm weakness 09/09/2019  . Right leg weakness 09/09/2019  . 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

## 2019-10-16 NOTE — Therapy (Signed)
[facility] [address] [place], [place], [address] Phone: [phone]   Fax:  [phone]  Speech Language Pathology Treatment  Patient Details  Name: Robert Mcconnell MRN: [id number] Date of Birth: 1922-02-14 Referring Provider (SLP): Wynema Birch, [place]   Encounter Date: 10/16/2019   End of Session - 10/16/19 [place]    Visit Number 4    Number of Visits 15    Date for SLP Re-Evaluation 12/05/19    Authorization Type VA    Authorization - Visit Number 4    Authorization - Number of Visits 15    SLP Start Time 2992    SLP Stop Time  1615    SLP Time Calculation (min) 36 min    Activity Tolerance Patient tolerated treatment well           Past Medical History:  Diagnosis Date  . Stroke [facility])     Past Surgical History:  Procedure Laterality Date  . COLECTOMY     cancer    There were no vitals filed for this visit.   Subjective Assessment - 10/16/19 1541    Subjective "I do it."    Patient is accompained by: Family member   daughter [name]   Currently in Pain? No/denies                 ADULT SLP TREATMENT - 10/16/19 1812      General Information   Behavior/Cognition Requires cueing;Decreased sustained attention;Alert      Treatment Provided   Treatment provided Cognitive-Linquistic      Pain Assessment   Pain Assessment No/denies pain      Cognitive-Linquistic Treatment   Treatment focused on Dysarthria    Skilled Treatment Daughter with patient today. Reports aide is completing speech exercises with patient daily. Daughter asked about doing Vital Stim for pt's speech musculature (home health was using Vital Stim for swallowing); SLP explained that this modality is not evidence-based for dysathria. Daughter confirmed they do not wish to target swallowing at this time but to focus on speech intelligibility. SLP explained that with pt's attention/cognitive deficits, intensity-based intervention  is recommended approach to maximize pt opportunity for improvement across speech parameters including loudness, articulation, respiration and phonation with minimal cognitive load/cuing. Patient required usual mod-max A with imitation of HEP including "hey you!" and personally relevant phrases. Usual mod-max A for carryover with single word responses. Patient did not maintain attention to task when cognitive load introduced (category naming), so SLP had pt read short words/phrases aloud from list of conversational phrases. Verbal and tactile cues for pt to repeat each time, louder.       Assessment / Recommendations / Plan   Plan Continue with current plan of care;Goals updated      Progression Toward Goals   Progression toward goals Progressing toward goals            SLP Education - 10/16/19 1828    Education Details rationale for intensity-based intervention to improve speech across parameters with minimal cognitive load    Person(s) Educated Patient;Child(ren)    Methods Explanation;Demonstration;Verbal cues    Comprehension Verbalized understanding;Need further instruction            SLP Short Term Goals - 10/16/19 1829      SLP SHORT TERM GOAL #1   Title pt will demo sustained attention for a 5- minute familiar task x5, in 3 sessions    Time 3    Period Weeks  Status On-going      SLP SHORT TERM GOAL #2   Title pt will demo knowledge of a memory book when asked pertinent questions dealing with memory in 3 sessions    Time 3    Period Weeks    Status On-going      SLP SHORT TERM GOAL #3   Title Pt will complete dysarthria HEP with usual mod cues from caregiver x3 sessions.    Time 3    Period Weeks    Status On-going            SLP Long Term Goals - 10/16/19 1830      SLP LONG TERM GOAL #1   Title pt will demo sustained attention for 3 10 minute therapy tasks in 3 sessions    Time 7    Period Weeks   or 15 total visits, for all LTGs   Status On-going       SLP LONG TERM GOAL #2   Title pt will engage with memory notebook/planner, WNL procedure, with min nonverbal cues to do so, in 3 sessions    Time 7    Period Weeks    Status On-going      SLP LONG TERM GOAL #3   Title Pt/family will tell SLP 3 signs of aspiration PNA.    Time 7    Period Weeks    Status On-going            Plan - 10/16/19 1829    Clinical Impression Statement Pt presents with significant deficits in cognitive linguistics incluing attention, memory, awareness. SLP judges moderate-severe dysarthria today; intelligibility ~25%. Pt with possibly expressive/receptive aphasia however he is also extremely hard of hearing, and this is very difficult to ascertain at this time due to pt's severe dysarthria. SLP again used clear face mask and written cues for comprehension. Pt with multi-factorial sx making improvement in speech and language difficult but SLP; attention today was adequate for pt to participate in simple functional speech tasks with mod-max cues. Pt may be able to benefit from skilled ST - if pt skills remain almost identical to eval pt may be discharged and re-evaled when pt attention becomes more functional for speech therapy setting.    Speech Therapy Frequency 2x / week    Duration --   7 weeks (or 15 visits - VA auth)   Treatment/Interventions Environmental controls;Oral motor exercises;Functional tasks;Multimodal communcation approach;Cueing hierarchy;Trials of upgraded texture/liquids;Diet toleration management by SLP;SLP instruction and feedback;Language facilitation;Cognitive reorganization;Compensatory strategies;Patient/family education;Internal/external aids    Potential to Achieve Goals Fair    Potential Considerations Severity of impairments    Consulted and Agree with Plan of Care Patient;Family member/caregiver    Family Member Consulted son in law           Patient will benefit from skilled therapeutic intervention in order to improve the following  deficits and impairments:   Dysarthria and anarthria  Cognitive communication deficit    Problem List Patient Active Problem List   Diagnosis Date Noted  . CKD (chronic kidney disease) stage 4, GFR 15-29 ml/min (HCC) 09/29/2019  . Thrombocytopenia ([place]) 09/29/2019  . Personal history of DVT (deep vein thrombosis) 09/09/2019  . History of ischemic stroke 09/09/2019  . CAD (coronary artery disease) 09/09/2019  . Chronic constipation 09/09/2019  . History of colorectal cancer 09/09/2019  . 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 Right arm weakness 09/09/2019  . Right leg weakness 09/09/2019  . 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 [place], [place], [address] Phone: [phone]   Fax:  [phone]   Name: Brondon Wann MRN: [id number] Date of Birth: [birth year]

## 2019-10-20 ENCOUNTER — Other Ambulatory Visit: Payer: Self-pay

## 2019-10-20 ENCOUNTER — Ambulatory Visit: Payer: No Typology Code available for payment source

## 2019-10-20 DIAGNOSIS — I69351 Hemiplegia and hemiparesis following cerebral infarction affecting right dominant side: Secondary | ICD-10-CM | POA: Diagnosis not present

## 2019-10-20 DIAGNOSIS — M6281 Muscle weakness (generalized): Secondary | ICD-10-CM

## 2019-10-20 DIAGNOSIS — R2681 Unsteadiness on feet: Secondary | ICD-10-CM

## 2019-10-20 NOTE — Therapy (Signed)
[facility] [address] [place], [place], [address] Phone: [phone]   Fax:  [phone]  Physical Therapy Treatment  Patient Details  Name: Robert Mcconnell MRN: [id number] Date of Birth: Oct 15, 1921 Referring Provider (PT): Arnell Sieving   Encounter Date: 10/20/2019   PT End of Session - 10/20/19 1812    Visit Number 13    Number of Visits 17    Date for PT Re-Evaluation 11/03/19    Authorization Type [facility]    Authorization Time Period 09/04/19 to 01/02/20    Authorization - Visit Number 10    Authorization - Number of Visits 15    Progress Note Due on Visit 15    PT Start Time 1620    PT Stop Time 1700    PT Time Calculation (min) 40 min    Equipment Utilized During Treatment Gait belt    Activity Tolerance Patient tolerated treatment well    Behavior During Therapy [facility] for tasks assessed/performed           Past Medical History:  Diagnosis Date  . Stroke [facility])     Past Surgical History:  Procedure Laterality Date  . COLECTOMY     cancer    There were no vitals filed for this visit.   Subjective Assessment - 10/20/19 1809    Subjective Daughter reports symptoms are stable.    Patient is accompained by: Family member    Pertinent History CVA    Limitations Standing;Walking;House hold activities;Sitting    How long can you sit comfortably? 1-2 hours    How long can you stand comfortably? unable    How long can you walk comfortably? unable    Patient Stated Goals walk    Currently in Pain? No/denies               Gait training: 4 x 5 feet, 1 x 10 feet Pt in parallel bar, Passive glove put on R hand to hold R hand on rail, max A x 1-2 with verbal and tactile cues for weight shift, total A required to advance R leg, wheel chair follow                          PT Short Term Goals - 09/29/19 1843      PT SHORT TERM GOAL #1   Title Patient will be able to perform  sit to stand with mod A with or without AD to improve independence and reduce care giver burden    Baseline max A    Time 4    Period Weeks    Status New    Target Date 10/06/19      PT SHORT TERM GOAL #2   Title pt will be able to stand with mod A for 5 min to perform small reaching with L UE to improve standing balance.    Baseline requires max A for standing, unable to reach    Time 4    Period Weeks    Status New    Target Date 10/06/19      PT SHORT TERM GOAL #3   Title Pt will be able to propel chair with SBA  for 30 feet to improve household navigation    Baseline total A with wheelchair propulsion    Time 4    Period Weeks    Status New    Target Date 10/06/19  PT Long Term Goals - 09/29/19 1843      PT LONG TERM GOAL #1   Title Patient will be able to stand for 5 min with CGA to improve standing endurance with or without AD    Baseline requires max A    Time 8    Period Weeks    Status New      PT LONG TERM GOAL #2   Title Pt will require CGA with sit to stand and chair to chair transfers to improve indepdnence and reduce caregiver burden.    Baseline max to total A    Time 8    Period Weeks    Status New      PT LONG TERM GOAL #3   Title Patient will be able to walk into walk-in shower and sit on shower chair with CGA to improve independence.    Baseline bed sponge bath only    Time 8    Period Weeks    Status New      PT LONG TERM GOAL #4   Title Pt will demo >10/56 on BBS to improve static balance and reduce fall risk    Baseline BBS 4/56 (eval)    Time 8    Period Weeks    Status New                 Plan - 10/20/19 1810    Clinical Impression Statement Today's session was focused on ambulation. Pt required decreasing amount of verbal and tactile cueing with parallel bar walking. patient still required max A with ambulation in parallel bar. Patient demonstrated 10 feet of gait with total A which is the most that he has done.  Patient's total ambulation distance was 30 feet with 10 feet of consecutive walking which is the most patient has done so far.    Personal Factors and Comorbidities Age;Comorbidity 3+    Comorbidities hx of stroke, hx of colorectal cancer    Examination-Activity Limitations Bathing;Bed Mobility;Caring for Others;Carry;Locomotion Level;Hygiene/Grooming;Dressing;Continence;Self Feeding;Reach Overhead;Sit;Lift;Other;Transfers;Toileting;Stand;Stairs    Examination-Participation Restrictions Church;Cleaning;Community Activity;Driving;Meal Prep;Volunteer;Shop;Medication Management;Laundry;Personal Finances;Yard Work;Interpersonal Relationship    Stability/Clinical Decision Making Unstable/Unpredictable    Rehab Potential Good    PT Frequency 2x / week    PT Duration 6 weeks    PT Treatment/Interventions ADLs/Self Care Home Management;Electrical Stimulation;Gait training;Stair training;Functional mobility training;Therapeutic exercise;Balance training;Neuromuscular re-education;Therapeutic activities;Patient/family education;Wheelchair mobility training;Manual techniques;Passive range of motion    PT Next Visit Plan continue to work on sit to stand transfers, chair to chair transfers, wheelchair mobility           Patient will benefit from skilled therapeutic intervention in order to improve the following deficits and impairments:  Abnormal gait, Decreased activity tolerance, Decreased coordination, Decreased safety awareness, Decreased strength, Impaired flexibility, Impaired UE functional use, Postural dysfunction, Impaired tone, Increased edema, Decreased range of motion, Decreased knowledge of precautions, Impaired sensation, Difficulty walking, Decreased mobility, Decreased endurance, Decreased balance  Visit Diagnosis: Muscle weakness (generalized)  Unsteadiness on feet  Hemiplegia and hemiparesis following cerebral infarction affecting right dominant side [facility])     Problem List Patient  Active Problem List   Diagnosis Date Noted  . CKD (chronic kidney disease) stage 4, GFR 15-29 ml/min (HCC) 09/29/2019  . Thrombocytopenia ([place]) 09/29/2019  . Personal history of DVT (deep vein thrombosis) 09/09/2019  . History of ischemic stroke 09/09/2019  . CAD (coronary artery disease) 09/09/2019  . Chronic constipation 09/09/2019  . History of colorectal cancer 09/09/2019  . Facial paralysis on  right side 09/09/2019  . Right arm weakness 09/09/2019  . Right leg weakness 09/09/2019  . 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 [place], [place], [address] Phone: [phone]   Fax:  [phone]  Name: Robert Mcconnell MRN: [id number] Date of Birth: Mar 04, 1922

## 2019-10-23 ENCOUNTER — Other Ambulatory Visit: Payer: Self-pay

## 2019-10-23 ENCOUNTER — Ambulatory Visit: Payer: No Typology Code available for payment source | Admitting: Occupational Therapy

## 2019-10-23 ENCOUNTER — Ambulatory Visit: Payer: No Typology Code available for payment source

## 2019-10-23 ENCOUNTER — Encounter: Payer: Self-pay | Admitting: Occupational Therapy

## 2019-10-23 DIAGNOSIS — M6281 Muscle weakness (generalized): Secondary | ICD-10-CM

## 2019-10-23 DIAGNOSIS — I69351 Hemiplegia and hemiparesis following cerebral infarction affecting right dominant side: Secondary | ICD-10-CM

## 2019-10-23 DIAGNOSIS — R600 Localized edema: Secondary | ICD-10-CM

## 2019-10-23 DIAGNOSIS — R414 Neurologic neglect syndrome: Secondary | ICD-10-CM

## 2019-10-23 DIAGNOSIS — M25611 Stiffness of right shoulder, not elsewhere classified: Secondary | ICD-10-CM

## 2019-10-23 DIAGNOSIS — R2681 Unsteadiness on feet: Secondary | ICD-10-CM

## 2019-10-23 DIAGNOSIS — R41840 Attention and concentration deficit: Secondary | ICD-10-CM

## 2019-10-23 NOTE — Therapy (Signed)
[facility] [address] [place], [place], [address] Phone: [phone]   Fax:  [phone]  Physical Therapy Treatment  Patient Details  Name: Robert Mcconnell MRN: [id number] Date of Birth: 03/02/1922 Referring Provider (PT): Arnell Sieving   Encounter Date: 10/23/2019   PT End of Session - 10/23/19 2211    Visit Number 14    Number of Visits 17    Date for PT Re-Evaluation 11/03/19    Authorization Type VA    Authorization Time Period 09/04/19 to 01/02/20    Authorization - Visit Number 11    Authorization - Number of Visits 15    Progress Note Due on Visit 15    Behavior During Therapy [facility] for tasks assessed/performed           Past Medical History:  Diagnosis Date   Stroke [facility])     Past Surgical History:  Procedure Laterality Date   COLECTOMY     cancer    There were no vitals filed for this visit.   Subjective Assessment - 10/23/19 2158    Subjective Daughter reports no new complaints.    Patient is accompained by: Family member    Pertinent History CVA    Limitations Standing;Walking;House hold activities;Sitting    How long can you sit comfortably? 1-2 hours    How long can you stand comfortably? <5 min    How long can you walk comfortably? unable    Patient Stated Goals walk    Currently in Pain? No/denies                        Treatment: Pt transferred from Power wheelchair to manual wheelchair with pt holding parallel bar and mod A. Worked on unilateral hamstring push and pulls with R LE: tactile cues at knees for approximation and distraction, tactile cues at trunk to lean forward when performing hamstring pulls to prevent "butt scoots".  5 x 10 feet for wheelchair push and pull unilaterally Seated LAQ: 10x, tactile cues at quads for hamstrings Seated ankle PF: absent muscle control in R quad noted, passive ROM performed: 20x Gait training: 2 x 10 feet Pt in  parallel bar, Passive glove put on R hand to hold R hand on rail, max A x 1-2 with verbal and tactile cues for weight shift, boot sock put on for easy glides, pt able to advance initiate R hip flexion during swing phase, required max A                PT Short Term Goals - 10/23/19 2200      PT SHORT TERM GOAL #1   Title Patient will be able to perform sit to stand with mod A with or without AD to improve independence and reduce care giver burden    Baseline max A    Time 4    Period Weeks    Status On-going    Target Date 10/06/19      PT SHORT TERM GOAL #2   Title pt will be able to stand with mod A for 5 min to perform small reaching with L UE to improve standing balance.    Baseline requires max A for standing, unable to reach, max A with stabilization when reaching forward    Time 4    Period Weeks    Status On-going    Target Date 10/06/19      PT SHORT TERM GOAL #3  Title Pt will be able to propel chair with SBA  for 30 feet to improve household navigation    Baseline total A with wheelchair propulsion, max A with wheelchair propulsion (10/23/19)    Time 4    Period Weeks    Status On-going    Target Date 10/06/19             PT Long Term Goals - 09/29/19 1843      PT LONG TERM GOAL #1   Title Patient will be able to stand for 5 min with CGA to improve standing endurance with or without AD    Baseline requires max A    Time 8    Period Weeks    Status New      PT LONG TERM GOAL #2   Title Pt will require CGA with sit to stand and chair to chair transfers to improve indepdnence and reduce caregiver burden.    Baseline max to total A    Time 8    Period Weeks    Status New      PT LONG TERM GOAL #3   Title Patient will be able to walk into walk-in shower and sit on shower chair with CGA to improve independence.    Baseline bed sponge bath only    Time 8    Period Weeks    Status New      PT LONG TERM GOAL #4   Title Pt will demo >10/56 on BBS  to improve static balance and reduce fall risk    Baseline BBS 4/56 (eval)    Time 8    Period Weeks    Status New                 Plan - 10/23/19 2220    Clinical Impression Statement Patient demonstrated improved quad and hamstring control today. Patient was able to initiate LAQ today which he has not done before. Patient was able to initiate hip flexion by 5-10% during swing phase on R leg. Patient is demonstrating improivng weight shifts during gait. Patient still requires max A x 1-2 during gait in parallel bars.    Personal Factors and Comorbidities Age;Comorbidity 3+    Comorbidities hx of stroke, hx of colorectal cancer    Examination-Activity Limitations Bathing;Bed Mobility;Caring for Others;Carry;Locomotion Level;Hygiene/Grooming;Dressing;Continence;Self Feeding;Reach Overhead;Sit;Lift;Other;Transfers;Toileting;Stand;Stairs    Examination-Participation Restrictions Church;Cleaning;Community Activity;Driving;Meal Prep;Volunteer;Shop;Medication Management;Laundry;Personal Finances;Yard Work;Interpersonal Relationship    Stability/Clinical Decision Making Unstable/Unpredictable    Rehab Potential Good    PT Frequency 2x / week    PT Duration 6 weeks    PT Treatment/Interventions ADLs/Self Care Home Management;Electrical Stimulation;Gait training;Stair training;Functional mobility training;Therapeutic exercise;Balance training;Neuromuscular re-education;Therapeutic activities;Patient/family education;Wheelchair mobility training;Manual techniques;Passive range of motion    PT Next Visit Plan continue to work on sit to stand transfers, chair to chair transfers, wheelchair mobility           Patient will benefit from skilled therapeutic intervention in order to improve the following deficits and impairments:  Abnormal gait, Decreased activity tolerance, Decreased coordination, Decreased safety awareness, Decreased strength, Impaired flexibility, Impaired UE functional use,  Postural dysfunction, Impaired tone, Increased edema, Decreased range of motion, Decreased knowledge of precautions, Impaired sensation, Difficulty walking, Decreased mobility, Decreased endurance, Decreased balance  Visit Diagnosis: Muscle weakness (generalized)  Unsteadiness on feet  Hemiplegia and hemiparesis following cerebral infarction affecting right dominant side [facility])     Problem List Patient Active Problem List   Diagnosis Date Noted   CKD (chronic kidney  disease) stage 4, GFR 15-29 ml/min (HCC) 09/29/2019   Thrombocytopenia ([place]) 09/29/2019   Personal history of DVT (deep vein thrombosis) 09/09/2019   History of ischemic stroke 09/09/2019   CAD (coronary artery disease) 09/09/2019   Chronic constipation 09/09/2019   History of colorectal cancer 09/09/2019   Facial paralysis on right side 09/09/2019   Right arm weakness 09/09/2019   Right leg weakness 09/09/2019   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 [place], [place], [address] Phone: [phone]   Fax:  [phone]  Name: Robert Mcconnell MRN: [id number] Date of Birth: July 25, 1921

## 2019-10-23 NOTE — Therapy (Signed)
[facility] [address] [place], [place], [address] Phone: [phone]   Fax:  [phone]  Occupational Therapy Treatment  Patient Details  Name: Robert Mcconnell MRN: [id number] Date of Birth: 19-May-1921 Referring Provider (OT): Eliezer Lofts   Encounter Date: 10/23/2019   OT End of Session - 10/23/19 1839    Visit Number 5    Number of Visits 13    Date for OT Re-Evaluation 11/24/19    Authorization Type VA    Authorization - Visit Number 5    Authorization - Number of Visits 15    OT Start Time 3016    OT Stop Time 0109    OT Time Calculation (min) 43 min    Activity Tolerance Patient tolerated treatment well    Behavior During Therapy [facility] for tasks assessed/performed           Past Medical History:  Diagnosis Date  . Stroke [facility])     Past Surgical History:  Procedure Laterality Date  . COLECTOMY     cancer    There were no vitals filed for this visit.   Subjective Assessment - 10/23/19 1837    Subjective  You do your best for everyone.    Patient is accompanied by: Family member    Currently in Pain? No/denies                        OT Treatments/Exercises (OP) - 10/23/19 0001      Splinting   Splinting Began fabrication of custom resting hand splint for right hand.  Patient's hand - dorsum mildly edematous this session.  Ulcer at thumb base healing.                    OT Education - 10/23/19 1839    Education Details Talked with patient and family of purpose of resting hand splint    Person(s) Educated Patient;Child(ren)    Methods Explanation    Comprehension Verbalized understanding;Need further instruction            OT Short Term Goals - 10/23/19 1841      OT SHORT TERM GOAL #1   Title Patient will complete lower body bathing with max assist due 6/19    Status On-going      OT SHORT TERM GOAL #2   Title Patient will bend forward while seated to pull  pants up thighs    Status Partially Met   Bends forward, not necessarilly to pull up pants     OT SHORT TERM GOAL #3   Title Patient will transition from sit to stand with mod assist in prep for tolieting on commode    Status Not Met      OT SHORT TERM GOAL #4   Title Patient/caregiver will don/doff custom RUE splint with min cueing    Status On-going   splint started 10/23/19            OT Long Term Goals - 09/25/19 1912      OT LONG TERM GOAL #1   Title Patient will transiton from sit to stand with mod assist to allow toileting, clothing management, and hygiene    Time 6    Period Weeks    Status New    Target Date 11/24/19      OT LONG TERM GOAL #2   Title Patient/caregiver will complete a home exercise program to prevent pain, and improve passive motion in  RUE to aide with hygiene and dressing tasks    Baseline Patient with pain throughout RUE    Time 6    Period Weeks    Status New      OT LONG TERM GOAL #3   Title Patient / caregiver will demonstrate effective edema management techniques for right hand, and support for right arm to prevent further subluxation    Time 6    Period Weeks    Status New      OT LONG TERM GOAL #4   Title Patient will attend to right arm, and manage right arm during transitional movements and in wheelchair with min cueing    Time 6    Period Weeks    Status New                 Plan - 10/23/19 1840    Clinical Impression Statement Patient with fewer behavioral outbursts, and more participative during therapy sessions.  Patient seems better able to trust therapy team with movement requests.    OT Frequency 2x / week    OT Duration 6 weeks    OT Treatment/Interventions Self-care/ADL training;Electrical Stimulation;Therapeutic exercise;Visual/perceptual remediation/compensation;Patient/family education;Splinting;Neuromuscular education;Therapeutic activities;Balance training;Cognitive remediation/compensation;Passive range of  motion;Manual Therapy;DME and/or AE instruction    Plan Finish Resting hand splint-   Postural control, sitting balance, sit to lift off, weight shift right, lower body ADL weight shifts, NMR RUE    OT Home Exercise Plan SLiding right hand on thigh, tilted stool    Consulted and Agree with Plan of Care Patient;Family member/caregiver           Patient will benefit from skilled therapeutic intervention in order to improve the following deficits and impairments:           Visit Diagnosis: Attention and concentration deficit  Neurologic neglect syndrome  Hemiplegia and hemiparesis following cerebral infarction affecting right dominant side (HCC)  Stiffness of right shoulder, not elsewhere classified  Localized edema  Unsteadiness on feet  Muscle weakness (generalized)    Problem List Patient Active Problem List   Diagnosis Date Noted  . CKD (chronic kidney disease) stage 4, GFR 15-29 ml/min (HCC) 09/29/2019  . Thrombocytopenia ([place]) 09/29/2019  . Personal history of DVT (deep vein thrombosis) 09/09/2019  . History of ischemic stroke 09/09/2019  . CAD (coronary artery disease) 09/09/2019  . Chronic constipation 09/09/2019  . History of colorectal cancer 09/09/2019  . 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 Right arm weakness 09/09/2019  . Right leg weakness 09/09/2019  . 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 [place], [place], [address] Phone: [phone]   Fax:  [phone]  Name: Robert Mcconnell MRN: [id number] Date of Birth: Jul 07, 1921

## 2019-10-27 ENCOUNTER — Ambulatory Visit: Payer: No Typology Code available for payment source

## 2019-10-27 ENCOUNTER — Other Ambulatory Visit: Payer: Self-pay

## 2019-10-27 DIAGNOSIS — I69351 Hemiplegia and hemiparesis following cerebral infarction affecting right dominant side: Secondary | ICD-10-CM

## 2019-10-27 DIAGNOSIS — M6281 Muscle weakness (generalized): Secondary | ICD-10-CM

## 2019-10-27 DIAGNOSIS — R2681 Unsteadiness on feet: Secondary | ICD-10-CM

## 2019-10-27 NOTE — Therapy (Signed)
[facility] [address] [place], [place], [address] Phone: [phone]   Fax:  [phone]  Physical Therapy Treatment  Patient Details  Name: Robert Mcconnell MRN: [id number] Date of Birth: May 13, 1921 Referring Provider (PT): Arnell Sieving   Encounter Date: 10/27/2019   PT End of Session - 10/27/19 1932    Visit Number 15    Number of Visits 17    Date for PT Re-Evaluation 11/03/19    Authorization Type [place]    Authorization Time Period 09/04/19 to 01/02/20    Authorization - Visit Number 12    Authorization - Number of Visits 15    Progress Note Due on Visit 15    PT Start Time 7026    PT Stop Time 1700    PT Time Calculation (min) 45 min    Equipment Utilized During Treatment Gait belt    Activity Tolerance Patient tolerated treatment well    Behavior During Therapy [facility] for tasks assessed/performed           Past Medical History:  Diagnosis Date  . Stroke [facility])     Past Surgical History:  Procedure Laterality Date  . COLECTOMY     cancer    There were no vitals filed for this visit.   Subjective Assessment - 10/27/19 1927    Subjective Symptoms are stable per daughter.    Patient is accompained by: Family member    Pertinent History CVA    Limitations Standing;Walking;House hold activities;Sitting    How long can you sit comfortably? 1-2 hours    How long can you stand comfortably? <5 min    How long can you walk comfortably? unable    Patient Stated Goals walk    Currently in Pain? No/denies                   Treatment: Pt transferred from Power wheelchair to manual wheelchair with pt holding parallel bar and mod A. Gait training: 3 x 10 feet Pt in parallel bar, Passive glove put on R hand to hold R hand on rail, max A x 1-2 with verbal and tactile cues for weight shift, boot sock put on for easy glides, pt able to advance initiate R hip flexion during swing phase, required max  A Sit to stand with using L UE pushing off wheelchair arm rest: 10x                        PT Short Term Goals - 10/23/19 2200      PT SHORT TERM GOAL #1   Title Patient will be able to perform sit to stand with mod A with or without AD to improve independence and reduce care giver burden    Baseline max A    Time 4    Period Weeks    Status On-going    Target Date 10/06/19      PT SHORT TERM GOAL #2   Title pt will be able to stand with mod A for 5 min to perform small reaching with L UE to improve standing balance.    Baseline requires max A for standing, unable to reach, max A with stabilization when reaching forward    Time 4    Period Weeks    Status On-going    Target Date 10/06/19      PT SHORT TERM GOAL #3   Title Pt will be able to propel chair with  SBA  for 30 feet to improve household navigation    Baseline total A with wheelchair propulsion, max A with wheelchair propulsion (10/23/19)    Time 4    Period Weeks    Status On-going    Target Date 10/06/19             PT Long Term Goals - 09/29/19 1843      PT LONG TERM GOAL #1   Title Patient will be able to stand for 5 min with CGA to improve standing endurance with or without AD    Baseline requires max A    Time 8    Period Weeks    Status New      PT LONG TERM GOAL #2   Title Pt will require CGA with sit to stand and chair to chair transfers to improve indepdnence and reduce caregiver burden.    Baseline max to total A    Time 8    Period Weeks    Status New      PT LONG TERM GOAL #3   Title Patient will be able to walk into walk-in shower and sit on shower chair with CGA to improve independence.    Baseline bed sponge bath only    Time 8    Period Weeks    Status New      PT LONG TERM GOAL #4   Title Pt will demo >10/56 on BBS to improve static balance and reduce fall risk    Baseline BBS 4/56 (eval)    Time 8    Period Weeks    Status New                 Plan  - 10/27/19 1931    Clinical Impression Statement Pt demonstrated decreased strength with transfers and walking and required increased assistance. Pt demonstrated decreased weight shift to each side with walking and decreased knee control bil. Pt was more fatigued today compared to previous session. We were not able to see trace mm contraction in R LE with LAQ today.    Personal Factors and Comorbidities Age;Comorbidity 3+    Comorbidities hx of stroke, hx of colorectal cancer    Examination-Activity Limitations Bathing;Bed Mobility;Caring for Others;Carry;Locomotion Level;Hygiene/Grooming;Dressing;Continence;Self Feeding;Reach Overhead;Sit;Lift;Other;Transfers;Toileting;Stand;Stairs    Examination-Participation Restrictions Church;Cleaning;Community Activity;Driving;Meal Prep;Volunteer;Shop;Medication Management;Laundry;Personal Finances;Yard Work;Interpersonal Relationship    Stability/Clinical Decision Making Unstable/Unpredictable    Rehab Potential Good    PT Frequency 2x / week    PT Duration 6 weeks    PT Treatment/Interventions ADLs/Self Care Home Management;Electrical Stimulation;Gait training;Stair training;Functional mobility training;Therapeutic exercise;Balance training;Neuromuscular re-education;Therapeutic activities;Patient/family education;Wheelchair mobility training;Manual techniques;Passive range of motion    PT Next Visit Plan continue to work on sit to stand transfers, chair to chair transfers, wheelchair mobility           Patient will benefit from skilled therapeutic intervention in order to improve the following deficits and impairments:  Abnormal gait, Decreased activity tolerance, Decreased coordination, Decreased safety awareness, Decreased strength, Impaired flexibility, Impaired UE functional use, Postural dysfunction, Impaired tone, Increased edema, Decreased range of motion, Decreased knowledge of precautions, Impaired sensation, Difficulty walking, Decreased mobility,  Decreased endurance, Decreased balance  Visit Diagnosis: Hemiplegia and hemiparesis following cerebral infarction affecting right dominant side (HCC)  Unsteadiness on feet  Muscle weakness (generalized)     Problem List Patient Active Problem List   Diagnosis Date Noted  . CKD (chronic kidney disease) stage 4, GFR 15-29 ml/min (HCC) 09/29/2019  . Thrombocytopenia ([place]) 09/29/2019  .  Personal history of DVT (deep vein thrombosis) 09/09/2019  . History of ischemic stroke 09/09/2019  . CAD (coronary artery disease) 09/09/2019  . Chronic constipation 09/09/2019  . History of colorectal cancer 09/09/2019  . 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 Right arm weakness 09/09/2019  . Right leg weakness 09/09/2019  . 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

## 2019-10-30 ENCOUNTER — Other Ambulatory Visit: Payer: Self-pay

## 2019-10-30 ENCOUNTER — Encounter: Payer: Medicare Other | Admitting: Occupational Therapy

## 2019-10-30 ENCOUNTER — Ambulatory Visit: Payer: No Typology Code available for payment source

## 2019-10-30 DIAGNOSIS — M6281 Muscle weakness (generalized): Secondary | ICD-10-CM

## 2019-10-30 DIAGNOSIS — I69351 Hemiplegia and hemiparesis following cerebral infarction affecting right dominant side: Secondary | ICD-10-CM | POA: Diagnosis not present

## 2019-10-30 DIAGNOSIS — R2681 Unsteadiness on feet: Secondary | ICD-10-CM

## 2019-10-30 NOTE — Therapy (Signed)
[facility] [address] [place], [place], [address] Phone: [phone]   Fax:  [phone]  Physical Therapy Therapy Re-certification  Patient Details  Name: Robert Mcconnell MRN: [id number] Date of Birth: 1921/08/30 Referring Provider (PT): Arnell Sieving   Encounter Date: 10/30/2019   PT End of Session - 10/30/19 1813    Visit Number 16    Number of Visits 17    Date for PT Re-Evaluation 12/25/19    Authorization Type [facility]    Authorization Time Period 09/04/19 to 01/02/20    Authorization - Visit Number 13    Authorization - Number of Visits 15    Progress Note Due on Visit 15    PT Start Time 1620    PT Stop Time 1705    PT Time Calculation (min) 45 min    Equipment Utilized During Treatment Gait belt    Activity Tolerance Patient tolerated treatment well    Behavior During Therapy [facility] for tasks assessed/performed           Past Medical History:  Diagnosis Date  . Stroke [facility])     Past Surgical History:  Procedure Laterality Date  . COLECTOMY     cancer    There were no vitals filed for this visit.   Subjective Assessment - 10/30/19 1809    Subjective Daughter reports he is stable at home.    Patient is accompained by: Family member    Pertinent History CVA    Limitations Standing;Walking;House hold activities;Sitting    How long can you sit comfortably? 1-2 hours    How long can you stand comfortably? <5 min    How long can you walk comfortably? unable    Patient Stated Goals walk                        Treatment: Treatment: Pt transferred from Power wheelchair to manual wheelchair with pt holding parallel bar and mod to max  A. Worked on unilateral hamstring push and pulls with R LE: tactile cues at knees for approximation and distraction, tactile cues at trunk to lean forward when performing hamstring pulls to prevent "butt scoots".  Gait training: 4 x 10 feet Pt in  parallel bar,  max A x 2 with verbal and tactile cues for weight shift and advance R LE forward, cues for appropriate weight shifts               PT Short Term Goals - 10/30/19 1809      PT SHORT TERM GOAL #1   Title Patient will be able to perform sit to stand with mod A with or without AD to improve independence and reduce care giver burden    Baseline max A (eval); mod to max A (10/30/19)    Time 4    Period Weeks    Status On-going    Target Date 11/27/19      PT SHORT TERM GOAL #2   Title pt will be able to stand with mod A for 5 min to perform small reaching with L UE to improve standing balance.    Baseline able to stand with holding on to parallel bars with L UE but needs stabilization at knees to improve standing tolerance (max A required overall) is able to reach with max A    Time 4    Period Weeks    Status On-going    Target Date 11/27/19  PT SHORT TERM GOAL #3   Title Pt will be able to propel chair with SBA  for 30 feet to improve household navigation    Baseline Able to propel chair with use of L UE and L LE with mod A for 30 feet (10/30/19)    Time 4    Period Weeks    Status Achieved    Target Date 10/06/19             PT Long Term Goals - 10/30/19 1812      PT LONG TERM GOAL #1   Title Patient will be able to stand for 5 min with CGA to improve standing endurance with or without AD    Baseline requires max A    Time 8    Period Weeks    Status On-going    Target Date 12/25/19      PT LONG TERM GOAL #2   Title Pt will require CGA with sit to stand and chair to chair transfers to improve indepdnence and reduce caregiver burden.    Baseline max to total A    Time 8    Period Weeks    Status On-going    Target Date 12/25/19      PT LONG TERM GOAL #3   Title Patient will be able to walk into walk-in shower and sit on shower chair with CGA to improve independence.    Baseline bed sponge bath only    Time 8    Period Weeks    Status  On-going    Target Date 12/25/19      PT LONG TERM GOAL #4   Title Pt will demo >10/56 on BBS to improve static balance and reduce fall risk    Baseline BBS 4/56 (eval)    Time 8    Period Weeks    Status On-going    Target Date 12/25/19                 Plan - 10/30/19 1812    Clinical Impression Statement Patient has been seen for total of 16 sessions from 09/08/19 to 10/30/19. Patient is currently requiring max A to perform sit to stand transfers, compared to total A on evaluation. Patient requires max A to be able to stand for about 5 min but now he is able to perform some reaching while maintaining standing with max A. Patient is able to take about 10 steps with max A x 2 and use of parallel bars. Patient is making slow and graudal progress with his functional transfers and mobility.    Personal Factors and Comorbidities Age;Comorbidity 3+    Comorbidities hx of stroke, hx of colorectal cancer    Examination-Activity Limitations Bathing;Bed Mobility;Caring for Others;Carry;Locomotion Level;Hygiene/Grooming;Dressing;Continence;Self Feeding;Reach Overhead;Sit;Lift;Other;Transfers;Toileting;Stand;Stairs    Examination-Participation Restrictions Church;Cleaning;Community Activity;Driving;Meal Prep;Volunteer;Shop;Medication Management;Laundry;Personal Finances;Yard Work;Interpersonal Relationship    Stability/Clinical Decision Making Unstable/Unpredictable    Rehab Potential Good    PT Frequency 2x / week    PT Duration 8 weeks    PT Treatment/Interventions ADLs/Self Care Home Management;Electrical Stimulation;Gait training;Stair training;Functional mobility training;Therapeutic exercise;Balance training;Neuromuscular re-education;Therapeutic activities;Patient/family education;Wheelchair mobility training;Manual techniques;Passive range of motion    PT Next Visit Plan continue to work on sit to stand transfers, chair to chair transfers, wheelchair mobility           Patient will  benefit from skilled therapeutic intervention in order to improve the following deficits and impairments:  Abnormal gait, Decreased activity tolerance, Decreased coordination, Decreased safety awareness, Decreased  strength, Impaired flexibility, Impaired UE functional use, Postural dysfunction, Impaired tone, Increased edema, Decreased range of motion, Decreased knowledge of precautions, Impaired sensation, Difficulty walking, Decreased mobility, Decreased endurance, Decreased balance  Visit Diagnosis: Hemiplegia and hemiparesis following cerebral infarction affecting right dominant side (HCC)  Unsteadiness on feet  Muscle weakness (generalized)     Problem List Patient Active Problem List   Diagnosis Date Noted  . CKD (chronic kidney disease) stage 4, GFR 15-29 ml/min (HCC) 09/29/2019  . Thrombocytopenia ([place]) 09/29/2019  . Personal history of DVT (deep vein thrombosis) 09/09/2019  . History of ischemic stroke 09/09/2019  . CAD (coronary artery disease) 09/09/2019  . Chronic constipation 09/09/2019  . History of colorectal cancer 09/09/2019  . 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 Right arm weakness 09/09/2019  . Right leg weakness 09/09/2019  . 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

## 2019-11-02 ENCOUNTER — Emergency Department (HOSPITAL_BASED_OUTPATIENT_CLINIC_OR_DEPARTMENT_OTHER): Payer: No Typology Code available for payment source

## 2019-11-02 ENCOUNTER — Other Ambulatory Visit: Payer: Self-pay

## 2019-11-02 ENCOUNTER — Encounter (HOSPITAL_BASED_OUTPATIENT_CLINIC_OR_DEPARTMENT_OTHER): Payer: Self-pay | Admitting: Emergency Medicine

## 2019-11-02 ENCOUNTER — Inpatient Hospital Stay (HOSPITAL_BASED_OUTPATIENT_CLINIC_OR_DEPARTMENT_OTHER)
Admission: EM | Admit: 2019-11-02 | Discharge: 2019-11-07 | DRG: 871 | Disposition: A | Discharge status: Home / Self Care | Payer: No Typology Code available for payment source | Attending: Internal Medicine | Admitting: Internal Medicine

## 2019-11-02 DIAGNOSIS — Z8673 Personal history of transient ischemic attack (TIA), and cerebral infarction without residual deficits: Secondary | ICD-10-CM

## 2019-11-02 DIAGNOSIS — I712 Thoracic aortic aneurysm, without rupture: Secondary | ICD-10-CM | POA: Diagnosis present

## 2019-11-02 DIAGNOSIS — L8990 Pressure ulcer of unspecified site, unspecified stage: Secondary | ICD-10-CM | POA: Insufficient documentation

## 2019-11-02 DIAGNOSIS — J189 Pneumonia, unspecified organism: Secondary | ICD-10-CM | POA: Diagnosis present

## 2019-11-02 DIAGNOSIS — J9611 Chronic respiratory failure with hypoxia: Secondary | ICD-10-CM

## 2019-11-02 DIAGNOSIS — I69391 Dysphagia following cerebral infarction: Secondary | ICD-10-CM

## 2019-11-02 DIAGNOSIS — Z20822 Contact with and (suspected) exposure to covid-19: Secondary | ICD-10-CM | POA: Diagnosis present

## 2019-11-02 DIAGNOSIS — N179 Acute kidney failure, unspecified: Secondary | ICD-10-CM | POA: Diagnosis present

## 2019-11-02 DIAGNOSIS — Z7901 Long term (current) use of anticoagulants: Secondary | ICD-10-CM

## 2019-11-02 DIAGNOSIS — N400 Enlarged prostate without lower urinary tract symptoms: Secondary | ICD-10-CM | POA: Diagnosis present

## 2019-11-02 DIAGNOSIS — G8191 Hemiplegia, unspecified affecting right dominant side: Secondary | ICD-10-CM

## 2019-11-02 DIAGNOSIS — I5041 Acute combined systolic (congestive) and diastolic (congestive) heart failure: Secondary | ICD-10-CM | POA: Diagnosis present

## 2019-11-02 DIAGNOSIS — B961 Klebsiella pneumoniae [K. pneumoniae] as the cause of diseases classified elsewhere: Secondary | ICD-10-CM | POA: Diagnosis present

## 2019-11-02 DIAGNOSIS — B9689 Other specified bacterial agents as the cause of diseases classified elsewhere: Secondary | ICD-10-CM | POA: Diagnosis present

## 2019-11-02 DIAGNOSIS — A4159 Other Gram-negative sepsis: Principal | ICD-10-CM | POA: Diagnosis present

## 2019-11-02 DIAGNOSIS — N390 Urinary tract infection, site not specified: Secondary | ICD-10-CM | POA: Diagnosis present

## 2019-11-02 DIAGNOSIS — N3001 Acute cystitis with hematuria: Secondary | ICD-10-CM | POA: Diagnosis present

## 2019-11-02 DIAGNOSIS — R29810 Facial weakness: Secondary | ICD-10-CM | POA: Diagnosis present

## 2019-11-02 DIAGNOSIS — I959 Hypotension, unspecified: Secondary | ICD-10-CM | POA: Diagnosis present

## 2019-11-02 DIAGNOSIS — I482 Chronic atrial fibrillation, unspecified: Secondary | ICD-10-CM | POA: Diagnosis present

## 2019-11-02 DIAGNOSIS — E872 Acidosis: Secondary | ICD-10-CM | POA: Diagnosis present

## 2019-11-02 DIAGNOSIS — R6520 Severe sepsis without septic shock: Secondary | ICD-10-CM

## 2019-11-02 DIAGNOSIS — I255 Ischemic cardiomyopathy: Secondary | ICD-10-CM | POA: Diagnosis present

## 2019-11-02 DIAGNOSIS — A419 Sepsis, unspecified organism: Secondary | ICD-10-CM

## 2019-11-02 DIAGNOSIS — Z955 Presence of coronary angioplasty implant and graft: Secondary | ICD-10-CM

## 2019-11-02 DIAGNOSIS — N3000 Acute cystitis without hematuria: Secondary | ICD-10-CM

## 2019-11-02 DIAGNOSIS — D696 Thrombocytopenia, unspecified: Secondary | ICD-10-CM | POA: Diagnosis present

## 2019-11-02 DIAGNOSIS — I69351 Hemiplegia and hemiparesis following cerebral infarction affecting right dominant side: Secondary | ICD-10-CM

## 2019-11-02 DIAGNOSIS — I130 Hypertensive heart and chronic kidney disease with heart failure and stage 1 through stage 4 chronic kidney disease, or unspecified chronic kidney disease: Secondary | ICD-10-CM | POA: Diagnosis present

## 2019-11-02 DIAGNOSIS — I2510 Atherosclerotic heart disease of native coronary artery without angina pectoris: Secondary | ICD-10-CM | POA: Diagnosis present

## 2019-11-02 DIAGNOSIS — E785 Hyperlipidemia, unspecified: Secondary | ICD-10-CM | POA: Diagnosis present

## 2019-11-02 DIAGNOSIS — I4819 Other persistent atrial fibrillation: Secondary | ICD-10-CM | POA: Diagnosis present

## 2019-11-02 DIAGNOSIS — R680 Hypothermia, not associated with low environmental temperature: Secondary | ICD-10-CM | POA: Diagnosis not present

## 2019-11-02 DIAGNOSIS — Z79899 Other long term (current) drug therapy: Secondary | ICD-10-CM

## 2019-11-02 DIAGNOSIS — N184 Chronic kidney disease, stage 4 (severe): Secondary | ICD-10-CM | POA: Diagnosis present

## 2019-11-02 DIAGNOSIS — R1310 Dysphagia, unspecified: Secondary | ICD-10-CM

## 2019-11-02 DIAGNOSIS — Z66 Do not resuscitate: Secondary | ICD-10-CM | POA: Diagnosis present

## 2019-11-02 DIAGNOSIS — L89152 Pressure ulcer of sacral region, stage 2: Secondary | ICD-10-CM | POA: Diagnosis present

## 2019-11-02 HISTORY — DX: Essential (primary) hypertension: I10

## 2019-11-02 HISTORY — DX: Benign prostatic hyperplasia without lower urinary tract symptoms: N40.0

## 2019-11-02 HISTORY — DX: Unspecified atrial fibrillation: I48.91

## 2019-11-02 HISTORY — DX: Chronic kidney disease, unspecified: N18.9

## 2019-11-02 HISTORY — DX: Presence of coronary angioplasty implant and graft: Z95.5

## 2019-11-02 LAB — PROTIME-INR
INR: 1.3 — ABNORMAL HIGH (ref 0.8–1.2)
Prothrombin Time: 15.5 seconds — ABNORMAL HIGH (ref 11.4–15.2)

## 2019-11-02 LAB — COMPREHENSIVE METABOLIC PANEL
ALT: 18 U/L (ref 0–44)
AST: 25 U/L (ref 15–41)
Albumin: 2.7 g/dL — ABNORMAL LOW (ref 3.5–5.0)
Alkaline Phosphatase: 88 U/L (ref 38–126)
Anion gap: 12 (ref 5–15)
BUN: 52 mg/dL — ABNORMAL HIGH (ref 8–23)
CO2: 22 mmol/L (ref 22–32)
Calcium: 8 mg/dL — ABNORMAL LOW (ref 8.9–10.3)
Chloride: 109 mmol/L (ref 98–111)
Creatinine, Ser: 2.32 mg/dL — ABNORMAL HIGH (ref 0.61–1.24)
GFR calc Af Amer: 27 mL/min — ABNORMAL LOW (ref >60)
GFR calc non Af Amer: 23 mL/min — ABNORMAL LOW (ref >60)
Glucose, Bld: 179 mg/dL — ABNORMAL HIGH (ref 70–99)
Potassium: 3.9 mmol/L (ref 3.5–5.1)
Sodium: 143 mmol/L (ref 135–145)
Total Bilirubin: 1 mg/dL (ref 0.3–1.2)
Total Protein: 6.4 g/dL — ABNORMAL LOW (ref 6.5–8.1)

## 2019-11-02 LAB — APTT: aPTT: 31 seconds (ref 24–36)

## 2019-11-02 MED ORDER — LACTATED RINGERS IV BOLUS (SEPSIS)
1000.0000 mL | Freq: Once | INTRAVENOUS | Status: AC
  Administered 2019-11-02: 1000 mL via INTRAVENOUS

## 2019-11-02 MED ORDER — LACTATED RINGERS IV BOLUS (SEPSIS)
250.0000 mL | Freq: Once | INTRAVENOUS | Status: AC
  Administered 2019-11-03: 250 mL via INTRAVENOUS

## 2019-11-02 MED ORDER — SODIUM CHLORIDE 0.9 % IV SOLN
1.0000 g | INTRAVENOUS | Status: DC
  Administered 2019-11-02 – 2019-11-06 (×4): 1 g via INTRAVENOUS
  Filled 2019-11-02 (×4): qty 10

## 2019-11-02 MED ORDER — LACTATED RINGERS IV BOLUS (SEPSIS)
1000.0000 mL | Freq: Once | INTRAVENOUS | Status: AC
  Administered 2019-11-03: 1000 mL via INTRAVENOUS

## 2019-11-02 MED ORDER — SODIUM CHLORIDE 0.9 % IV SOLN
INTRAVENOUS | Status: DC | PRN
  Administered 2019-11-02: 250 mL via INTRAVENOUS

## 2019-11-02 NOTE — ED Notes (Signed)
ED Provider at bedside. 

## 2019-11-02 NOTE — ED Triage Notes (Signed)
BIB daughter who reports concern for fever and UTI. Pt c/o burning with urination. TMAX AT HOME 102. Pt alerted, LSW 1900.

## 2019-11-03 ENCOUNTER — Inpatient Hospital Stay (HOSPITAL_COMMUNITY): Payer: No Typology Code available for payment source

## 2019-11-03 ENCOUNTER — Encounter (HOSPITAL_COMMUNITY): Payer: Self-pay | Admitting: Internal Medicine

## 2019-11-03 ENCOUNTER — Ambulatory Visit: Payer: No Typology Code available for payment source

## 2019-11-03 DIAGNOSIS — D696 Thrombocytopenia, unspecified: Secondary | ICD-10-CM | POA: Diagnosis present

## 2019-11-03 DIAGNOSIS — I493 Ventricular premature depolarization: Secondary | ICD-10-CM | POA: Diagnosis not present

## 2019-11-03 DIAGNOSIS — A4159 Other Gram-negative sepsis: Secondary | ICD-10-CM | POA: Diagnosis present

## 2019-11-03 DIAGNOSIS — N400 Enlarged prostate without lower urinary tract symptoms: Secondary | ICD-10-CM | POA: Diagnosis present

## 2019-11-03 DIAGNOSIS — Z20822 Contact with and (suspected) exposure to covid-19: Secondary | ICD-10-CM | POA: Diagnosis present

## 2019-11-03 DIAGNOSIS — R29810 Facial weakness: Secondary | ICD-10-CM | POA: Diagnosis present

## 2019-11-03 DIAGNOSIS — N390 Urinary tract infection, site not specified: Secondary | ICD-10-CM | POA: Diagnosis not present

## 2019-11-03 DIAGNOSIS — I4819 Other persistent atrial fibrillation: Secondary | ICD-10-CM | POA: Diagnosis present

## 2019-11-03 DIAGNOSIS — I482 Chronic atrial fibrillation, unspecified: Secondary | ICD-10-CM | POA: Diagnosis not present

## 2019-11-03 DIAGNOSIS — J9611 Chronic respiratory failure with hypoxia: Secondary | ICD-10-CM | POA: Diagnosis not present

## 2019-11-03 DIAGNOSIS — N3001 Acute cystitis with hematuria: Secondary | ICD-10-CM | POA: Diagnosis present

## 2019-11-03 DIAGNOSIS — I340 Nonrheumatic mitral (valve) insufficiency: Secondary | ICD-10-CM | POA: Diagnosis not present

## 2019-11-03 DIAGNOSIS — R6520 Severe sepsis without septic shock: Secondary | ICD-10-CM | POA: Diagnosis present

## 2019-11-03 DIAGNOSIS — I130 Hypertensive heart and chronic kidney disease with heart failure and stage 1 through stage 4 chronic kidney disease, or unspecified chronic kidney disease: Secondary | ICD-10-CM | POA: Diagnosis present

## 2019-11-03 DIAGNOSIS — I5041 Acute combined systolic (congestive) and diastolic (congestive) heart failure: Secondary | ICD-10-CM | POA: Diagnosis present

## 2019-11-03 DIAGNOSIS — I429 Cardiomyopathy, unspecified: Secondary | ICD-10-CM | POA: Diagnosis not present

## 2019-11-03 DIAGNOSIS — A419 Sepsis, unspecified organism: Secondary | ICD-10-CM | POA: Diagnosis not present

## 2019-11-03 DIAGNOSIS — I69351 Hemiplegia and hemiparesis following cerebral infarction affecting right dominant side: Secondary | ICD-10-CM | POA: Diagnosis not present

## 2019-11-03 DIAGNOSIS — N184 Chronic kidney disease, stage 4 (severe): Secondary | ICD-10-CM | POA: Diagnosis present

## 2019-11-03 DIAGNOSIS — I2510 Atherosclerotic heart disease of native coronary artery without angina pectoris: Secondary | ICD-10-CM | POA: Diagnosis present

## 2019-11-03 DIAGNOSIS — J189 Pneumonia, unspecified organism: Secondary | ICD-10-CM | POA: Diagnosis present

## 2019-11-03 DIAGNOSIS — I959 Hypotension, unspecified: Secondary | ICD-10-CM | POA: Diagnosis present

## 2019-11-03 DIAGNOSIS — N179 Acute kidney failure, unspecified: Secondary | ICD-10-CM | POA: Diagnosis present

## 2019-11-03 DIAGNOSIS — E785 Hyperlipidemia, unspecified: Secondary | ICD-10-CM | POA: Diagnosis present

## 2019-11-03 DIAGNOSIS — E872 Acidosis: Secondary | ICD-10-CM | POA: Diagnosis present

## 2019-11-03 DIAGNOSIS — L89152 Pressure ulcer of sacral region, stage 2: Secondary | ICD-10-CM | POA: Diagnosis present

## 2019-11-03 DIAGNOSIS — Z66 Do not resuscitate: Secondary | ICD-10-CM | POA: Diagnosis present

## 2019-11-03 DIAGNOSIS — I712 Thoracic aortic aneurysm, without rupture: Secondary | ICD-10-CM | POA: Diagnosis present

## 2019-11-03 DIAGNOSIS — I69391 Dysphagia following cerebral infarction: Secondary | ICD-10-CM | POA: Diagnosis not present

## 2019-11-03 DIAGNOSIS — I361 Nonrheumatic tricuspid (valve) insufficiency: Secondary | ICD-10-CM | POA: Diagnosis not present

## 2019-11-03 DIAGNOSIS — I255 Ischemic cardiomyopathy: Secondary | ICD-10-CM | POA: Diagnosis present

## 2019-11-03 DIAGNOSIS — N3000 Acute cystitis without hematuria: Secondary | ICD-10-CM | POA: Diagnosis present

## 2019-11-03 LAB — CBC WITH DIFFERENTIAL/PLATELET
Abs Immature Granulocytes: 0.05 10*3/uL (ref 0.00–0.07)
Abs Immature Granulocytes: 0.04 10*3/uL (ref 0.00–0.07)
Basophils Absolute: 0 10*3/uL (ref 0.0–0.1)
Basophils Absolute: 0 10*3/uL (ref 0.0–0.1)
Basophils Relative: 0 %
Basophils Relative: 0 %
Eosinophils Absolute: 0 10*3/uL (ref 0.0–0.5)
Eosinophils Absolute: 0 10*3/uL (ref 0.0–0.5)
Eosinophils Relative: 0 %
Eosinophils Relative: 0 %
HCT: 37.9 % — ABNORMAL LOW (ref 39.0–52.0)
HCT: 32.2 % — ABNORMAL LOW (ref 39.0–52.0)
Hemoglobin: 11.5 g/dL — ABNORMAL LOW (ref 13.0–17.0)
Hemoglobin: 9.8 g/dL — ABNORMAL LOW (ref 13.0–17.0)
Immature Granulocytes: 1 %
Immature Granulocytes: 0 %
Lymphocytes Relative: 5 %
Lymphocytes Relative: 10 %
Lymphs Abs: 0.4 10*3/uL — ABNORMAL LOW (ref 0.7–4.0)
Lymphs Abs: 1.1 10*3/uL (ref 0.7–4.0)
MCH: 30.2 pg (ref 26.0–34.0)
MCH: 30.3 pg (ref 26.0–34.0)
MCHC: 30.3 g/dL (ref 30.0–36.0)
MCHC: 30.4 g/dL (ref 30.0–36.0)
MCV: 99.5 fL (ref 80.0–100.0)
MCV: 99.7 fL (ref 80.0–100.0)
Monocytes Absolute: 0.4 10*3/uL (ref 0.1–1.0)
Monocytes Absolute: 0.5 10*3/uL (ref 0.1–1.0)
Monocytes Relative: 4 %
Monocytes Relative: 5 %
Neutro Abs: 8.2 10*3/uL — ABNORMAL HIGH (ref 1.7–7.7)
Neutro Abs: 9 10*3/uL — ABNORMAL HIGH (ref 1.7–7.7)
Neutrophils Relative %: 90 %
Neutrophils Relative %: 85 %
Platelets: 99 10*3/uL — ABNORMAL LOW (ref 150–400)
Platelets: 90 10*3/uL — ABNORMAL LOW (ref 150–400)
RBC: 3.81 MIL/uL — ABNORMAL LOW (ref 4.22–5.81)
RBC: 3.23 MIL/uL — ABNORMAL LOW (ref 4.22–5.81)
RDW: 15.7 % — ABNORMAL HIGH (ref 11.5–15.5)
RDW: 15.8 % — ABNORMAL HIGH (ref 11.5–15.5)
Smear Review: NORMAL
WBC: 9.1 10*3/uL (ref 4.0–10.5)
WBC: 10.5 10*3/uL (ref 4.0–10.5)
nRBC: 0 % (ref 0.0–0.2)
nRBC: 0 % (ref 0.0–0.2)

## 2019-11-03 LAB — COMPREHENSIVE METABOLIC PANEL
ALT: 19 U/L (ref 0–44)
AST: 25 U/L (ref 15–41)
Albumin: 2.2 g/dL — ABNORMAL LOW (ref 3.5–5.0)
Alkaline Phosphatase: 76 U/L (ref 38–126)
Anion gap: 8 (ref 5–15)
BUN: 49 mg/dL — ABNORMAL HIGH (ref 8–23)
CO2: 24 mmol/L (ref 22–32)
Calcium: 8.1 mg/dL — ABNORMAL LOW (ref 8.9–10.3)
Chloride: 112 mmol/L — ABNORMAL HIGH (ref 98–111)
Creatinine, Ser: 2.08 mg/dL — ABNORMAL HIGH (ref 0.61–1.24)
GFR calc Af Amer: 30 mL/min — ABNORMAL LOW (ref >60)
GFR calc non Af Amer: 26 mL/min — ABNORMAL LOW (ref >60)
Glucose, Bld: 159 mg/dL — ABNORMAL HIGH (ref 70–99)
Potassium: 4 mmol/L (ref 3.5–5.1)
Sodium: 144 mmol/L (ref 135–145)
Total Bilirubin: 0.8 mg/dL (ref 0.3–1.2)
Total Protein: 5.6 g/dL — ABNORMAL LOW (ref 6.5–8.1)

## 2019-11-03 LAB — URINALYSIS, ROUTINE W REFLEX MICROSCOPIC
Bilirubin Urine: NEGATIVE
Glucose, UA: NEGATIVE mg/dL
Ketones, ur: NEGATIVE mg/dL
Nitrite: NEGATIVE
Protein, ur: 30 mg/dL — AB
Specific Gravity, Urine: 1.025 (ref 1.005–1.030)
pH: 6 (ref 5.0–8.0)

## 2019-11-03 LAB — TSH: TSH: 3.379 u[IU]/mL (ref 0.350–4.500)

## 2019-11-03 LAB — URINALYSIS, MICROSCOPIC (REFLEX): WBC, UA: >50 WBC/hpf (ref 0–5)

## 2019-11-03 LAB — LACTIC ACID, PLASMA
Lactic Acid, Venous: 2.8 mmol/L (ref 0.5–1.9)
Lactic Acid, Venous: 2.4 mmol/L (ref 0.5–1.9)
Lactic Acid, Venous: 1.7 mmol/L (ref 0.5–1.9)
Lactic Acid, Venous: 1.2 mmol/L (ref 0.5–1.9)

## 2019-11-03 LAB — PROCALCITONIN
Procalcitonin: 18.11 ng/mL
Procalcitonin: 1.02 ng/mL

## 2019-11-03 LAB — MRSA PCR SCREENING: MRSA by PCR: NEGATIVE

## 2019-11-03 LAB — TROPONIN I (HIGH SENSITIVITY): Troponin I (High Sensitivity): 64 ng/L — ABNORMAL HIGH (ref <18)

## 2019-11-03 LAB — SARS CORONAVIRUS 2 BY RT PCR (HOSPITAL ORDER, PERFORMED IN ~~LOC~~ HOSPITAL LAB): SARS Coronavirus 2: NEGATIVE

## 2019-11-03 MED ORDER — LACTATED RINGERS IV SOLN: INTRAVENOUS | Status: DC

## 2019-11-03 MED ORDER — MORPHINE SULFATE (PF) 2 MG/ML IV SOLN: 2.0000 mg | INTRAVENOUS | Status: DC | PRN

## 2019-11-03 MED ORDER — MELATONIN 3 MG PO TABS
9.0000 mg | ORAL_TABLET | Freq: Every day | ORAL | Status: DC
  Administered 2019-11-03 – 2019-11-06 (×4): 9 mg via ORAL
  Filled 2019-11-03 (×4): qty 3

## 2019-11-03 MED ORDER — ACETAMINOPHEN 325 MG PO TABS: 650.0000 mg | ORAL_TABLET | Freq: Four times a day (QID) | ORAL | Status: DC | PRN

## 2019-11-03 MED ORDER — ATORVASTATIN CALCIUM 40 MG PO TABS
40.0000 mg | ORAL_TABLET | Freq: Two times a day (BID) | ORAL | Status: DC
  Administered 2019-11-03 – 2019-11-07 (×9): 40 mg via ORAL
  Filled 2019-11-03 (×9): qty 1

## 2019-11-03 MED ORDER — ONDANSETRON HCL 4 MG/2ML IJ SOLN: 4.0000 mg | Freq: Four times a day (QID) | INTRAMUSCULAR | Status: DC | PRN

## 2019-11-03 MED ORDER — HYDRALAZINE HCL 20 MG/ML IJ SOLN: 5.0000 mg | INTRAMUSCULAR | Status: DC | PRN

## 2019-11-03 MED ORDER — POLYETHYLENE GLYCOL 3350 17 G PO PACK: 17.0000 g | PACK | Freq: Every day | ORAL | Status: DC | PRN

## 2019-11-03 MED ORDER — MIRTAZAPINE 7.5 MG PO TABS
15.0000 mg | ORAL_TABLET | Freq: Every day | ORAL | Status: DC
  Administered 2019-11-03 – 2019-11-06 (×4): 15 mg via ORAL
  Filled 2019-11-03 (×4): qty 2

## 2019-11-03 MED ORDER — ACETAMINOPHEN 650 MG RE SUPP
650.0000 mg | Freq: Four times a day (QID) | RECTAL | Status: DC | PRN
  Filled 2019-11-03: qty 1

## 2019-11-03 MED ORDER — ONDANSETRON HCL 4 MG PO TABS: 4.0000 mg | ORAL_TABLET | Freq: Four times a day (QID) | ORAL | Status: DC | PRN

## 2019-11-03 MED ORDER — SODIUM CHLORIDE 0.9% FLUSH
3.0000 mL | Freq: Two times a day (BID) | INTRAVENOUS | Status: DC
  Administered 2019-11-03 – 2019-11-07 (×9): 3 mL via INTRAVENOUS

## 2019-11-03 MED ORDER — HYDROCODONE-ACETAMINOPHEN 5-325 MG PO TABS: 1.0000 | ORAL_TABLET | ORAL | Status: DC | PRN

## 2019-11-03 MED ORDER — ZINC SULFATE 66 MG PO TABS: 15.0000 mg | ORAL_TABLET | Freq: Two times a day (BID) | ORAL | Status: DC

## 2019-11-03 MED ORDER — SODIUM CHLORIDE 0.9 % IV SOLN
500.0000 mg | Freq: Once | INTRAVENOUS | Status: AC
  Administered 2019-11-03: 500 mg via INTRAVENOUS
  Filled 2019-11-03: qty 500

## 2019-11-03 MED ORDER — DOCUSATE SODIUM 100 MG PO CAPS
100.0000 mg | ORAL_CAPSULE | Freq: Two times a day (BID) | ORAL | Status: DC
  Administered 2019-11-03 – 2019-11-07 (×8): 100 mg via ORAL
  Filled 2019-11-03 (×8): qty 1

## 2019-11-03 MED ORDER — HEPARIN (PORCINE) 25000 UT/250ML-% IV SOLN
750.0000 [IU]/h | INTRAVENOUS | Status: DC
  Administered 2019-11-03: 1050 [IU]/h via INTRAVENOUS
  Administered 2019-11-04: 750 [IU]/h via INTRAVENOUS
  Filled 2019-11-03 (×2): qty 250

## 2019-11-03 MED ORDER — SODIUM CHLORIDE 0.9 % IV SOLN
500.0000 mg | INTRAVENOUS | Status: DC
  Administered 2019-11-04 – 2019-11-07 (×4): 500 mg via INTRAVENOUS
  Filled 2019-11-03 (×5): qty 500

## 2019-11-03 MED ORDER — FINASTERIDE 5 MG PO TABS
5.0000 mg | ORAL_TABLET | Freq: Every day | ORAL | Status: DC
  Administered 2019-11-03 – 2019-11-07 (×5): 5 mg via ORAL
  Filled 2019-11-03 (×5): qty 1

## 2019-11-03 MED ORDER — HEPARIN BOLUS VIA INFUSION
2000.0000 [IU] | Freq: Once | INTRAVENOUS | Status: AC
  Administered 2019-11-03: 2000 [IU] via INTRAVENOUS
  Filled 2019-11-03: qty 2000

## 2019-11-03 MED ORDER — APIXABAN 2.5 MG PO TABS: 2.5000 mg | ORAL_TABLET | Freq: Two times a day (BID) | ORAL | Status: DC

## 2019-11-03 MED ORDER — AZITHROMYCIN 200 MG/5ML PO SUSR
500.0000 mg | Freq: Once | ORAL | Status: DC
  Filled 2019-11-03: qty 12.5

## 2019-11-03 MED ORDER — CALCITRIOL 0.25 MCG PO CAPS
2.2500 ug | ORAL_CAPSULE | ORAL | Status: DC
  Administered 2019-11-03 – 2019-11-07 (×3): 2.25 ug via ORAL
  Filled 2019-11-03 (×3): qty 9

## 2019-11-03 MED ORDER — FAMOTIDINE 40 MG/5ML PO SUSR
20.0000 mg | Freq: Every day | ORAL | Status: DC
  Administered 2019-11-04 – 2019-11-07 (×4): 20 mg via ORAL
  Filled 2019-11-03 (×4): qty 2.5

## 2019-11-03 MED ORDER — BISACODYL 5 MG PO TBEC: 5.0000 mg | DELAYED_RELEASE_TABLET | Freq: Every day | ORAL | Status: DC | PRN

## 2019-11-03 MED ORDER — SENNA 8.6 MG PO TABS
1.0000 | ORAL_TABLET | Freq: Every day | ORAL | Status: DC
  Administered 2019-11-03 – 2019-11-07 (×5): 8.6 mg via ORAL
  Filled 2019-11-03 (×5): qty 1

## 2019-11-03 MED ORDER — TERAZOSIN HCL 5 MG PO CAPS
5.0000 mg | ORAL_CAPSULE | Freq: Every day | ORAL | Status: DC
  Administered 2019-11-03 – 2019-11-06 (×4): 5 mg via ORAL
  Filled 2019-11-03 (×5): qty 1

## 2019-11-03 NOTE — ED Provider Notes (Signed)
[facility] EMERGENCY DEPARTMENT Provider Note   CSN: [id number] Arrival date & time: 11/02/19  2300     History Chief Complaint  Patient presents with  . Code Sepsis    Robert Mcconnell is a 86 y.o. male.  HPI     This is a 86 year old male with a history of chronic kidney disease, thrombocytopenia, ischemic stroke, coronary artery disease with fever and dysuria.  Daughter provides the history.  He lives with his daughter.  He was fine earlier yesterday.  However they noted at bedtime that he felt warm and was a little more disoriented.  Fever was 102 at home.  He did complain of dysuria after lunchtime yesterday.  She also did note some blood in his diaper this evening.  She states that they attempted to give him Tylenol but he vomited.  No known sick contacts.  He has been vaccinated against COVID-19.  Per the patient's daughter they would not want aggressive resuscitative measures if patient were to deteriorate including intubation or CPR.  He is DNR/DNI.  Patient is unable to contribute to history taking secondary to altered mental status.  Past Medical History:  Diagnosis Date  . CKD (chronic kidney disease)   . History of coronary angioplasty with insertion of stent   . Stroke [facility])     Patient Active Problem List   Diagnosis Date Noted  . CKD (chronic kidney disease) stage 4, GFR 15-29 ml/min (HCC) 09/29/2019  . Thrombocytopenia ([place]) 09/29/2019  . Personal history of DVT (deep vein thrombosis) 09/09/2019  . History of ischemic stroke 09/09/2019  . CAD (coronary artery disease) 09/09/2019  . Chronic constipation 09/09/2019  . History of colorectal cancer 09/09/2019  . 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 Right arm weakness 09/09/2019  . Right leg weakness 09/09/2019  . 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 COLECTOMY     cancer       History reviewed. No pertinent family  history.  Social History   Tobacco Use  . Smoking status: Never Smoker  . Smokeless tobacco: Never Used  Substance Use Topics  . Alcohol use: Yes  . Drug use: Never    Home Medications Prior to Admission medications   Medication Sig Start Date End Date Taking? Authorizing Provider  apixaban (ELIQUIS) 2.5 MG TABS tablet Take by mouth 2 (two) times daily.    [provider]  atorvastatin (LIPITOR) 40 MG tablet Take 40 mg by mouth in the morning and at bedtime.    [provider]  calcitRIOL (ROCALTROL) 0.25 MCG capsule Take 2.25 mcg by mouth every Monday, Wednesday, and Friday. 2.25 mcg M/W/F    [provider]  Docusate Sodium 150 MG/15ML syrup Take 100 mg by mouth daily.    [provider]  famotidine (PEPCID) 40 MG/5ML suspension Take 20 mg by mouth daily.    [provider]  finasteride (PROSCAR) 5 MG tablet Take 5 mg by mouth daily.    [provider]  mirtazapine (REMERON) 15 MG tablet Take 15 mg by mouth at bedtime.    [provider]  senna (SENOKOT) 8.6 MG TABS tablet Take 1 tablet by mouth daily.    [provider]  terazosin (HYTRIN) 5 MG capsule Take 5 mg by mouth at bedtime.    [provider]    Allergies    Patient has no known allergies.  Review of Systems   Review of  Systems  Unable to perform ROS: Mental status change  Constitutional: Positive for fever.  Respiratory: Negative for cough and shortness of breath.   Cardiovascular: Negative for chest pain.  Gastrointestinal: Positive for vomiting.  Genitourinary: Positive for dysuria.    Physical Exam Updated Vital Signs BP 92/63   Pulse (!) 54   Temp (!) 101.6 F (38.7 C) (Rectal)   Resp (!) 23   Ht 1.753 m (5\' 9" )   Wt 74.6 kg   SpO2 94%   BMI 24.28 kg/m   Physical Exam Vitals and nursing note reviewed.  Constitutional:      Appearance: He is well-developed.     Comments: Ill-appearing, elderly, frail  HENT:      Head: Normocephalic and atraumatic.     Nose: Nose normal.     Mouth/Throat:     Mouth: Mucous membranes are dry.  Eyes:     Pupils: Pupils are equal, round, and reactive to light.  Cardiovascular:     Rate and Rhythm: Normal rate and regular rhythm.     Heart sounds: Murmur heard.   Pulmonary:     Effort: Pulmonary effort is normal. No respiratory distress.     Breath sounds: Normal breath sounds. No wheezing.     Comments: Mild tachypnea, rales bilaterally Abdominal:     General: Bowel sounds are normal.     Palpations: Abdomen is soft.     Tenderness: There is no abdominal tenderness. There is no rebound.  Musculoskeletal:     Cervical back: Neck supple.     Right lower leg: No edema.     Left lower leg: No edema.  Skin:    General: Skin is warm and dry.     Comments: Venous stasis change right lower extremity  Neurological:     Comments: Oriented only to self, somnolent but arousable, moves all 4 extremities  Psychiatric:     Comments: Unable to assess     ED Results / Procedures / Treatments   Labs (all labs ordered are listed, but only abnormal results are displayed) Labs Reviewed  LACTIC ACID, PLASMA - Abnormal; Notable for the following components:      Result Value   Lactic Acid, Venous 2.8 (*)    All other components within normal limits  LACTIC ACID, PLASMA - Abnormal; Notable for the following components:   Lactic Acid, Venous 2.4 (*)    All other components within normal limits  COMPREHENSIVE METABOLIC PANEL - Abnormal; Notable for the following components:   Glucose, Bld 179 (*)    BUN 52 (*)    Creatinine, Ser 2.32 (*)    Calcium 8.0 (*)    Total Protein 6.4 (*)    Albumin 2.7 (*)    GFR calc non Af Amer 23 (*)    GFR calc Af Amer 27 (*)    All other components within normal limits  CBC WITH DIFFERENTIAL/PLATELET - Abnormal; Notable for the following components:   RBC 3.81 (*)    Hemoglobin 11.5 (*)    HCT 37.9 (*)    RDW 15.7 (*)    Platelets  99 (*)    Neutro Abs 8.2 (*)    Lymphs Abs 0.4 (*)    All other components within normal limits  PROTIME-INR - Abnormal; Notable for the following components:   Prothrombin Time 15.5 (*)    INR 1.3 (*)    All other components within normal limits  URINALYSIS, ROUTINE W REFLEX MICROSCOPIC - Abnormal; Notable for  the following components:   APPearance TURBID (*)    Hgb urine dipstick SMALL (*)    Protein, ur 30 (*)    Leukocytes,Ua MODERATE (*)    All other components within normal limits  URINALYSIS, MICROSCOPIC (REFLEX) - Abnormal; Notable for the following components:   Bacteria, UA MANY (*)    All other components within normal limits  SARS CORONAVIRUS 2 BY RT PCR (HOSPITAL ORDER, [facility])  CULTURE, BLOOD (ROUTINE X 2)  CULTURE, BLOOD (ROUTINE X 2)  URINE CULTURE  APTT    EKG EKG Interpretation  Date/Time:  "Sunday November 02 2019 23:15:00 EDT Ventricular Rate:  99 PR Interval:    QRS Duration: 147 QT Interval:  426 QTC Calculation: 547 R Axis:   -65 Text Interpretation: Atrial fibrillation Ventricular premature complex IVCD, consider atypical RBBB Anterolateral infarct, age indeterminate No prior Confirmed by ,  ([id number]) on 11/03/2019 12:40:05 AM   Radiology DG Chest Port 1 View  Result Date: 11/02/2019 CLINICAL DATA:  Fever. EXAM: PORTABLE CHEST 1 VIEW COMPARISON:  None. FINDINGS: Right chest port with tip in the SVC. Cardiomegaly with aortic atherosclerosis and tortuosity. Probable calcified mediastinal lymph node. Mild vascular congestion. Patchy bibasilar opacities, right greater than left. Small pleural effusions suspected. No pneumothorax. No acute osseous abnormalities are seen. IMPRESSION: 1. Cardiomegaly with vascular congestion. Probable small pleural effusions. 2. Patchy bibasilar opacities, right greater than left, may represent pneumonia in the setting of fever, alternatively atelectasis or asymmetric pulmonary edema is  also considered. Aortic Atherosclerosis (ICD10-I70.0). Electronically Signed   By: Melanie  Sanford M.D.   On: 11/02/2019 23:56    Procedures Procedures (including critical care time)  CRITICAL CARE Performed by:  F    Total critical care time: 60 minutes  Critical care time was exclusive of separately billable procedures and treating other patients.  Critical care was necessary to treat or prevent imminent or life-threatening deterioration.  Critical care was time spent personally by me on the following activities: development of treatment plan with patient and/or surrogate as well as nursing, discussions with consultants, evaluation of patient's response to treatment, examination of patient, obtaining history from patient or surrogate, ordering and performing treatments and interventions, ordering and review of laboratory studies, ordering and review of radiographic studies, pulse oximetry and re-evaluation of patient's condition.   Medications Ordered in ED Medications  cefTRIAXone (ROCEPHIN) 1 g in sodium chloride 0.9 % 100 mL IVPB ( Intravenous Stopped 11/03/19 0019)  0.9 %  sodium chloride infusion ( Intravenous Restarted 11/03/19 0128)  azithromycin (ZITHROMAX) 500 mg in sodium chloride 0.9 % 250 mL IVPB (500 mg Intravenous New Bag/Given 11/03/19 0130)  lactated ringers bolus 1,000 mL (0 mLs Intravenous Stopped 11/03/19 0152)    And  lactated ringers bolus 1,000 mL (0 mLs Intravenous Stopped 11/03/19 0152)    And  lactated ringers bolus 250 mL (250 mLs Intravenous New Bag/Given 11/03/19 0152)    ED Course  I have reviewed the triage vital signs and the nursing notes.  Pertinent labs & imaging results that were available during my care of the patient were reviewed by me and considered in my medical decision making (see chart for details).    MDM Rules/Calculators/A&P                          89"  year old male presents with fever and urinary symptoms.  Sepsis alert  upon arrival as he was febrile to 101.6 and  hypotensive with systolic blood pressure in the 80s.  Given history, high level of suspicion for sepsis.  Sepsis work-up initiated.  Patient was given 30 cc/kg fluid and started on Rocephin for presumed urinary source.  Lactate 2.8.  Chest x-ray independently reviewed by myself and shows cardiomegaly with some vascular congestion and bibasilar basilar opacities consistent with asymmetric pulmonary edema versus atelectasis versus pneumonia.  Azithromycin was added to the patient's antibiotic regimen for this reason.  He does not appear overtly volume overloaded.  Lab work reviewed.  Notable for BUN of 52, creatinine of 2.32 without a prior for comparison.  No significant leukocytosis or other metabolic derangement.  EKG shows atrial fibrillation.  No obvious ischemia.  Patient's blood pressure remains soft but fairly improved.  Map greater than 65.  He appears to be well perfused.  Given x-ray findings and goals of care, will avoid further excessive fluid resuscitation.  On repeat exam he appears well perfused.  Mental status is approximately the same.  We will plan for admission to the hospital.  Covid testing obtained and negative.  Robert Mcconnell was evaluated in Emergency Department on 11/03/2019 for the symptoms described in the history of present illness. He was evaluated in the context of the global COVID-19 pandemic, which necessitated consideration that the patient might be at risk for infection with the SARS-CoV-2 virus that causes COVID-19. Institutional protocols and algorithms that pertain to the evaluation of patients at risk for COVID-19 are in a state of rapid change based on information released by regulatory bodies including the CDC and federal and state organizations. These policies and algorithms were followed during the patient's care in the ED.   2:28 AM Sepsis - Repeat Assessment  Performed at:    0215  Vitals     Blood pressure 92/63,  pulse (!) 54, temperature (!) 101.6 F (38.7 C), temperature source Rectal, resp. rate (!) 23, height 1.753 m (5\' 9" ), weight 74.6 kg, SpO2 94 %.  Heart:     Irregular rate and rhythm  Lungs:    Rales  Capillary Refill:   <2 sec  Peripheral Pulse:   Radial pulse palpable  Skin:     Normal Color     Final Clinical Impression(s) / ED Diagnoses Final diagnoses:  Severe sepsis ([place])  Acute cystitis without hematuria    Rx / DC Orders ED Discharge Orders    None       Brier Firebaugh, Barbette Hair, MD 11/03/19 0230

## 2019-11-03 NOTE — ED Notes (Signed)
Pt oxygen saturation reading 75%. RN to room. Pt appears to be sleeping but wakes to loud vocal stim. Oxygen saturation 98-99% when awake.

## 2019-11-03 NOTE — H&P (Signed)
History and Physical    Robert Mcconnell MVH:[id number] DOB: Jan 02, 1922 DOA: 11/02/2019  PCP: [name], MD Consultants:  None Patient coming from:  Home - lives with wife and daughter and her husband; NOK: Wife, [phone]  Chief Complaint: Fever  HPI: Robert Mcconnell is a 86 y.o. male with medical history significant of CAD s/p stent; CVA; and stage 4 CKD presenting with fever.  He seemed to be ok but had a bit of blood on his penis after urination about 3 days ago.  He was eating well and then started with chills and high fever and then started vomiting.  He was unable to keep Tylenol down.  Fever was up to 102-103.  Things significantly worsened last night.  He has hemiparesis and speech difficulty due to CVA.  No h/o nephrolithiasis.  He does have CKD and he would not pursue HD.  No sick contacts.  No pain.    ED Course: [facility] to [facility] transfer, per Dr. Hal Hope:  86 year old male with history of chronic kidney disease stage IV A. fib recently moved from [place] lives with his daughter was found to have increased fever of 102 F and some dysuria since last 24 hours. Brought to the ER is found to be hypotensive initially improved with fluids still confused UA consider UTI chest x-ray shows possibility of pneumonia empiric antibiotic started admitted for sepsis likely from UTI. Patient is a DNR. Does not want dialysis.   Review of Systems: Unable to perform due to somnolence  Ambulatory Status:  Non-ambulatory  COVID Vaccine Status:  Complete  Past Medical History:  Diagnosis Date  . Atrial fibrillation ([place])   . BPH (benign prostatic hyperplasia)   . CKD (chronic kidney disease)   . History of coronary angioplasty with insertion of stent   . Hypertension   . Stroke [facility])     Past Surgical History:  Procedure Laterality Date  . COLECTOMY  2006   cancer    Social History   Socioeconomic History  . Marital status: Married    Spouse name: Not on file  .  Number of children: Not on file  . Years of education: Not on file  . Highest education level: Not on file  Occupational History  . Occupation: retired  Tobacco Use  . Smoking status: Never Smoker  . Smokeless tobacco: Never Used  Substance and Sexual Activity  . Alcohol use: Yes    Comment: occasionally  . Drug use: Never  . Sexual activity: Not Currently    Partners: Female  Other Topics Concern  . Not on file  Social History Narrative  . Not on file   Social Determinants of Health   Financial Resource Strain:   . Difficulty of Paying Living Expenses:   Food Insecurity:   . Worried About Charity fundraiser in the Last Year:   . Arboriculturist in the Last Year:   Transportation Needs:   . Film/video editor (Medical):   [name] Kitchen Lack of Transportation (Non-Medical):   Physical Activity:   . Days of Exercise per Week:   . Minutes of Exercise per Session:   Stress:   . Feeling of Stress :   Social Connections:   . Frequency of Communication with Friends and Family:   . Frequency of Social Gatherings with Friends and Family:   . Attends Religious Services:   . Active Member of Clubs or Organizations:   . Attends Archivist Meetings:   [name] Kitchen Marital  Status:   Intimate Partner Violence:   . Fear of Current or Ex-Partner:   . Emotionally Abused:   [name] Kitchen Physically Abused:   . Sexually Abused:     No Known Allergies  History reviewed. No pertinent family history.  Prior to Admission medications   Medication Sig Start Date End Date Taking? Authorizing Provider  apixaban (ELIQUIS) 2.5 MG TABS tablet Take by mouth 2 (two) times daily.    [provider]  atorvastatin (LIPITOR) 40 MG tablet Take 40 mg by mouth in the morning and at bedtime.    [provider]  calcitRIOL (ROCALTROL) 0.25 MCG capsule Take 2.25 mcg by mouth every Monday, Wednesday, and Friday. 2.25 mcg M/W/F    [provider]  Docusate Sodium 150 MG/15ML syrup Take 100 mg by  mouth daily.    [provider]  famotidine (PEPCID) 40 MG/5ML suspension Take 20 mg by mouth daily.    [provider]  finasteride (PROSCAR) 5 MG tablet Take 5 mg by mouth daily.    [provider]  mirtazapine (REMERON) 15 MG tablet Take 15 mg by mouth at bedtime.    [provider]  senna (SENOKOT) 8.6 MG TABS tablet Take 1 tablet by mouth daily.    [provider]  terazosin (HYTRIN) 5 MG capsule Take 5 mg by mouth at bedtime.    [provider]    Physical Exam: Vitals:   11/03/19 0402 11/03/19 0415 11/03/19 0530 11/03/19 0737  BP: (!) 88/61 105/72 107/75 (!) 107/58  Pulse: 69 (!) 57 (!) 59 68  Resp: (!) 21 16 19 11   Temp:  97.6 F (36.4 C) 97.6 F (36.4 C) (!) 97 F (36.1 C)  TempSrc:  Axillary Axillary Axillary  SpO2: 98% 99% 100% 100%  Weight:      Height:         . General:  Appears calm and comfortable and is NAD; somnolent but easily arousable . Eyes:   EOMI, normal lids, iris . ENT:  Extremely hard of hearing, grossly normal lips, mildly dry . Neck:  no LAD, masses or thyromegaly . Cardiovascular:  Irregularly irregular, no m/r/g. No LE edema.  [name] Kitchen Respiratory:   CTA bilaterally with no wheezes/rales/rhonchi.  Normal respiratory effort. . Abdomen:  soft, NT, ND, NABS . Back:   normal alignment, no CVAT . Skin:  no rash or induration seen on limited exam . Musculoskeletal:  Chronic R hemiparesis . Psychiatric:  Chronically dysarthric speech . Neurologic:  Chronic R facial droop    Radiological Exams on Admission: CT HEAD WO CONTRAST  Result Date: 11/03/2019 CLINICAL DATA:  Fever and UTI.  Encephalopathy EXAM: CT HEAD WITHOUT CONTRAST TECHNIQUE: Contiguous axial images were obtained from the base of the skull through the vertex without intravenous contrast. COMPARISON:  None. FINDINGS: Brain: Remote left MCA branch infarct affecting the frontal operculum and insula. Remote perforator infarct at the left basal  ganglia and corona radiata. Mild for age chronic small vessel ischemia. No evidence of acute infarct, hemorrhage, hydrocephalus, or mass. Vascular: No hyperdense vessel or unexpected calcification. Skull: Normal. Negative for fracture or focal lesion. Sinuses/Orbits: No acute finding. IMPRESSION: 1. No acute or reversible finding. 2. Remote left MCA branch infarcts. Electronically Signed   By: Monte Fantasia M.D.   On: 11/03/2019 06:34   US RENAL  Result Date: 11/03/2019 CLINICAL DATA:  Sepsis. EXAM: RENAL / URINARY TRACT ULTRASOUND COMPLETE COMPARISON:  None. FINDINGS: Right Kidney: Renal measurements: 10.6 x 5.0 x  4.1 cm = volume: 112.9 mL. Renal cortical thinning and increased echogenicity suggesting medical renal disease. Simple appearing 7 cm cyst projecting off the upper pole region of the right kidney. Septated cyst measuring 4.5 x 4.5 x 4.8 cm projecting off the inferior and medial aspect of the right kidney. No hydronephrosis. Left Kidney: Renal measurements: 9.1 x 4.6 x 4.0 cm = volume: 86.4 mL. Renal cortical thinning and increased echogenicity suggesting medical renal disease. 1.3 cm simple appearing cyst. No hydronephrosis. Bladder: The bladder demonstrates mild trabeculation and may be due to mild bladder outlet obstruction from a slightly enlarged prostate gland. No bladder mass or calculi. Other: None. IMPRESSION: 1. Bilateral renal cysts. 2. Renal cortical thinning and increased echogenicity suggesting medical renal disease. 3. Mild prostate gland enlargement and mild trabeculation of the bladder. Electronically Signed   By: Marijo Sanes M.D.   On: 11/03/2019 10:33   DG Chest Port 1 View  Result Date: 11/02/2019 CLINICAL DATA:  Fever. EXAM: PORTABLE CHEST 1 VIEW COMPARISON:  None. FINDINGS: Right chest port with tip in the SVC. Cardiomegaly with aortic atherosclerosis and tortuosity. Probable calcified mediastinal lymph node. Mild vascular congestion. Patchy bibasilar opacities, right  greater than left. Small pleural effusions suspected. No pneumothorax. No acute osseous abnormalities are seen. IMPRESSION: 1. Cardiomegaly with vascular congestion. Probable small pleural effusions. 2. Patchy bibasilar opacities, right greater than left, may represent pneumonia in the setting of fever, alternatively atelectasis or asymmetric pulmonary edema is also considered. Aortic Atherosclerosis (ICD10-I70.0). Electronically Signed   By: Keith Rake M.D.   On: 11/02/2019 23:56    EKG: Independently reviewed.  Afib with rate 47, slow ventricular response; nonspecific ST changes with no evidence of acute ischemia   Labs on Admission: I have personally reviewed the available labs and imaging studies at the time of the admission.  Pertinent labs:   Glucose 179 BUN 52/Creatinine 2.32/GFR 23 Albumin 2.7 WBC 9.1 Hgb 11.5 Platelets 99 INR 1.3 UA: small Hgb, moderate LE, 30 protein, many bacteria, >50 WBC Lactate 2.8, 2.4, 1.7, 1.2 MRSA PCR negative COVID negative Blood and urine cultures pending Procalcitonin 18.11 -> 1.02   Assessment/Plan Principal Problem:   Sepsis secondary to UTI [facility]) Active Problems:   History of ischemic stroke   CAD (coronary artery disease)   CKD (chronic kidney disease) stage 4, GFR 15-29 ml/min (HCC)   Dysphagia as late effect of cerebrovascular accident (CVA)   Sepsis due to UTI -SIRS criteria in this patient includes: fever, tachypnea  -Patient has evidence of acute organ failure with elevated lactate >2, recurrent hypotension that is not easily explained by another condition. -While awaiting blood cultures, this appears to be a preseptic condition. -Sepsis protocol initiated -Patient had SBP <90/MAP <65 and so has received the 30 cc/kg IVF bolus. -Suspected source is UTI - patient had report of blood at the end of his penis following urination on the days leading up to the issue and UA is abnormal -Renal US obtained to ensure that there was  no obstructed stone involved and was negative for nephrolithiasis -Blood and urine cultures pending -Will admit to progressive care due to: hemodynamic instability -Treat with IV Rocephin for presumed urinary source; will also add Azithromycin for empiric CAP coverage although this is a lesser consideration -Trended lactate to ensure improvement and he has cleared his lactate acidosis -Procalcitonin level markedly abnormal initially.  As the procalcitonin level normalizes, it will be reasonable to consider de-escalation of antibiotic coverage.  Dysphagia -Since his CVA  his wife notes intermittent difficulty with dysphagia -CXR with some concern bibasilar opacities -Will continue CAP coverage, as noted above -Will also keep patient NPO pending swallow evaluation -His wife indicates that they puree his food although she also denies that he is supposed to be on any kind of specific diet  H/o CVA -Ongoing marked deficits with R hemiparesis, R facial droop, dysphagia, and dysarthria -PT/OT consults requested -He is still undergoing therapy through rehab -Continue Remeron  Stage 4 CKD -Appears to be stable at this time -Patient does not desire HD -If progressive CKD, may need transition to palliative care -Continue Calcitriol  CAD/Afib -Continue Eliquis, Lipitor  BPH -Continue Proscar, terazosin  DNR -I have discussed code status with the patient's wife and the patient would not desire resuscitation and would prefer to die a natural death should that situation arise.   Note: This patient has been tested and is negative for the novel coronavirus COVID-19.    DVT prophylaxis:  Eliquis Code Status:  DNR - confirmed with family Family Communication: Wife present throughout evaluation  Disposition Plan:  The patient is from: home  Anticipated d/c is to: home with [facility] services  Anticipated d/c date will depend on clinical response to treatment, likely 2-3 days  Patient is currently:  acutely ill Consults called: PT/OT/ST  Admission status: Admit - It is my clinical opinion that admission to INPATIENT is reasonable and necessary because of the expectation that this patient will require hospital care that crosses at least 2 midnights to treat this condition based on the medical complexity of the problems presented.  Given the aforementioned information, the predictability of an adverse outcome is felt to be significant.    Karmen Bongo MD [facility]   How to contact the [facility] Attending or Consulting provider [place] or covering provider during after hours [place], for this patient?  1. Check the care team in [facility] and look for a) attending/consulting TR[facility] provider listed and b) the [facility] team listed 2. Log into [email or web address] and use [place]'s universal password to access. If you do not have the password, please contact the hospital operator. 3. Locate the [facility] provider you are looking for under [facility] and page to a number that you can be directly reached. 4. If you still have difficulty reaching the provider, please page the [facility] (Director on Call) for the Hospitalists listed on amion for assistance.   11/03/2019, 11:02 AM

## 2019-11-03 NOTE — Evaluation (Addendum)
Clinical/Bedside Swallow Evaluation Patient Details  Name: Robert Mcconnell MRN: [id number] Date of Birth: 10-08-1921  Today's Date: 11/03/2019 Time: SLP Start Time (ACUTE ONLY): 9622 SLP Stop Time (ACUTE ONLY): 1445 SLP Time Calculation (min) (ACUTE ONLY): 30 min  Past Medical History:  Past Medical History:  Diagnosis Date  . Atrial fibrillation ([place])   . BPH (benign prostatic hyperplasia)   . CKD (chronic kidney disease)   . History of coronary angioplasty with insertion of stent   . Hypertension   . Stroke [facility])    Past Surgical History:  Past Surgical History:  Procedure Laterality Date  . COLECTOMY  2006   cancer   HPI:  86 y.o. male with medical history significant of CAD s/p stent; CVA; and stage 4 CKD presenting with fever.  He seemed to be ok but had a bit of blood on his penis after urination about 3 days ago.  He was eating well and then started with chills and high fever and then started vomiting.  He was unable to keep Tylenol down.  Fever was up to 102-103.  Things significantly worsened last night.  He has hemiparesis and speech difficulty due to CVA.  CT head on 11/03/19 indicated: No acute or reversible finding. 2. Remote left MCA branch infarcts; CXR on 11/02/19 indicated: Patchy bibasilar opacities, right greater than left, may represent pneumonia in the setting of fever, alternatively atelectasis or asymmetric pulmonary edema is also considered  Assessment / Plan / Recommendation Clinical Impression  Pt presents with oropharyngeal dysphagia characterized by decreased oral propulsion/oral holding and decreased mastication with various consistencies ranging from thin via spoon, cup and straw, puree and cracker (solid) with oral retention/residue on right (prior CVA in Feb 2021) requiring lingual sweep on right and liquid wash; immediate cough noted with thin via straw, small sips/tsp sips with full supervision/mod-max cues given with audible swallow and multiple swallows  intermittently, but cough eliminated; recommend initiating a Dysphagia 2 (diced)/thin liquid diet with swallowing precautions in place for safety purposes; pt is at mild-moderate risk for aspiration without precautions; ST will f/u for diet tolerance with meals and need for objective assessment while in acute setting.  Thank you for this consult. pending SLE SLP Visit Diagnosis: Dysphagia, oropharyngeal phase (R13.12)    Aspiration Risk  Mild aspiration risk;Moderate aspiration risk    Diet Recommendation   Dysphagia 2/thin liquids  Medication Administration: Crushed with puree    Other  Recommendations Oral Care Recommendations: Oral care BID   Follow up Recommendations 24 hour supervision/assistance;Other (comment) (TBD)      Frequency and Duration min 2x/week  2 weeks       Prognosis Prognosis for Safe Diet Advancement: Good      Swallow Study   General Date of Onset: 11/02/19 HPI: 86 y.o. male with medical history significant of CAD s/p stent; CVA; and stage 4 CKD presenting with fever.  He seemed to be ok but had a bit of blood on his penis after urination about 3 days ago.  He was eating well and then started with chills and high fever and then started vomiting.  He was unable to keep Tylenol down.  Fever was up to 102-103.  Things significantly worsened last night.  He has hemiparesis and speech difficulty due to CVA.  Type of Study: Bedside Swallow Evaluation Previous Swallow Assessment:  (n/a) Diet Prior to this Study: NPO Temperature Spikes Noted: Yes Respiratory Status: Nasal cannula History of Recent Intubation: No Behavior/Cognition: Alert;Cooperative;Requires cueing Oral Cavity  Assessment: Dry;Other (comment) (tongue coating) Oral Care Completed by SLP: Recent completion by staff Oral Cavity - Dentition: Adequate natural dentition Vision: Functional for self-feeding Self-Feeding Abilities: Able to feed self;Needs assist Patient Positioning: Upright in  bed Baseline Vocal Quality: Low vocal intensity Volitional Cough: Strong Volitional Swallow: Able to elicit    Oral/Motor/Sensory Function Overall Oral Motor/Sensory Function: Mild impairment Facial ROM: Reduced right Facial Symmetry: Abnormal symmetry right Facial Strength: Reduced right Facial Sensation: Reduced right Lingual ROM: Reduced right Lingual Symmetry: Abnormal symmetry right Lingual Strength: Reduced Velum: Within Functional Limits Mandible: Within Functional Limits   Ice Chips Ice chips: Impaired Presentation: Spoon Pharyngeal Phase Impairments: Suspected delayed Swallow   Thin Liquid Thin Liquid: Impaired Presentation: Cup;Straw;Spoon Oral Phase Impairments: Reduced labial seal Oral Phase Functional Implications: Right anterior spillage Pharyngeal  Phase Impairments: Suspected delayed Swallow;Multiple swallows;Throat Clearing - Delayed;Cough - Immediate;Cough - Delayed    Nectar Thick Nectar Thick Liquid: Not tested   Honey Thick Honey Thick Liquid: Not tested   Puree Puree: Within functional limits Presentation: Spoon   Solid     Solid: Impaired Presentation: Self Fed Oral Phase Impairments: Reduced lingual movement/coordination;Impaired mastication Oral Phase Functional Implications: Right lateral sulci pocketing;Impaired mastication;Oral residue Pharyngeal Phase Impairments: Suspected delayed Swallow;Other (comments) (liquid wash )      Elvina Sidle, M.S., CCC-SLP 11/03/2019,3:17 PM

## 2019-11-03 NOTE — ED Notes (Signed)
Dr. Dina Rich aware of 2.8 Lactic

## 2019-11-03 NOTE — Progress Notes (Signed)
[place] for Eliquis to Heparin Indication: atrial fibrillation  No Known Allergies  Patient Measurements: Height: 5\' 9"  (175.3 cm) Weight: 74.6 kg (164 lb 6.4 oz) IBW/kg (Calculated) : 70.7  Vital Signs: Temp: 97.5 F (36.4 C) (06/28 1100) Temp Source: Axillary (06/28 1100) BP: 107/69 (06/28 1200) Pulse Rate: 85 (06/28 1200)  Labs: Recent Labs    11/02/19 2315 11/03/19 0643  HGB 11.5* 9.8*  HCT 37.9* 32.2*  PLT 99* 90*  APTT 31  --   LABPROT 15.5*  --   INR 1.3*  --   CREATININE 2.32* 2.08*  TROPONINIHS  --  64*    Estimated Creatinine Clearance: 21.2 mL/min (A) (by C-G formula based on SCr of 2.08 mg/dL (H)).  Assessment: 86 year old male to begin heparin while NPO and Eliquis on hold for Afib Last dose of Eliquis 6/27 Baseline PTT 31 seconds  Goal of Therapy:  aPTT 66-102 seconds seconds Monitor platelets by anticoagulation protocol: Yes  Heparin level = 0.3-0.7   Plan:  Heparin 2000 units iv bolus x 1 Heparin drip at 1050 units / hr Heparin level and PTT 8 hours after heparin starts Daily heparin level, PTT, CBC  Thank you Anette Guarneri, PharmD  11/03/2019,3:15 PM

## 2019-11-04 ENCOUNTER — Encounter (HOSPITAL_COMMUNITY): Payer: Self-pay | Admitting: Internal Medicine

## 2019-11-04 ENCOUNTER — Inpatient Hospital Stay (HOSPITAL_COMMUNITY): Payer: No Typology Code available for payment source

## 2019-11-04 DIAGNOSIS — N184 Chronic kidney disease, stage 4 (severe): Secondary | ICD-10-CM

## 2019-11-04 DIAGNOSIS — I2510 Atherosclerotic heart disease of native coronary artery without angina pectoris: Secondary | ICD-10-CM

## 2019-11-04 DIAGNOSIS — A419 Sepsis, unspecified organism: Secondary | ICD-10-CM

## 2019-11-04 DIAGNOSIS — R1310 Dysphagia, unspecified: Secondary | ICD-10-CM

## 2019-11-04 DIAGNOSIS — J189 Pneumonia, unspecified organism: Secondary | ICD-10-CM

## 2019-11-04 DIAGNOSIS — G8191 Hemiplegia, unspecified affecting right dominant side: Secondary | ICD-10-CM

## 2019-11-04 DIAGNOSIS — N3000 Acute cystitis without hematuria: Secondary | ICD-10-CM

## 2019-11-04 DIAGNOSIS — I482 Chronic atrial fibrillation, unspecified: Secondary | ICD-10-CM | POA: Diagnosis present

## 2019-11-04 DIAGNOSIS — I340 Nonrheumatic mitral (valve) insufficiency: Secondary | ICD-10-CM

## 2019-11-04 DIAGNOSIS — R6520 Severe sepsis without septic shock: Secondary | ICD-10-CM

## 2019-11-04 DIAGNOSIS — I361 Nonrheumatic tricuspid (valve) insufficiency: Secondary | ICD-10-CM

## 2019-11-04 DIAGNOSIS — Z8673 Personal history of transient ischemic attack (TIA), and cerebral infarction without residual deficits: Secondary | ICD-10-CM

## 2019-11-04 DIAGNOSIS — N179 Acute kidney failure, unspecified: Secondary | ICD-10-CM

## 2019-11-04 HISTORY — DX: Sepsis, unspecified organism: A41.9

## 2019-11-04 HISTORY — DX: Sepsis, unspecified organism: R65.20

## 2019-11-04 LAB — CBC
HCT: 32.4 % — ABNORMAL LOW (ref 39.0–52.0)
Hemoglobin: 9.8 g/dL — ABNORMAL LOW (ref 13.0–17.0)
MCH: 30 pg (ref 26.0–34.0)
MCHC: 30.2 g/dL (ref 30.0–36.0)
MCV: 99.1 fL (ref 80.0–100.0)
Platelets: 84 10*3/uL — ABNORMAL LOW (ref 150–400)
RBC: 3.27 MIL/uL — ABNORMAL LOW (ref 4.22–5.81)
RDW: 15.8 % — ABNORMAL HIGH (ref 11.5–15.5)
WBC: 5.9 10*3/uL (ref 4.0–10.5)
nRBC: 0 % (ref 0.0–0.2)

## 2019-11-04 LAB — ECHOCARDIOGRAM COMPLETE
Height: 69 in
Weight: 2630.4 oz

## 2019-11-04 LAB — BASIC METABOLIC PANEL
Anion gap: 10 (ref 5–15)
BUN: 49 mg/dL — ABNORMAL HIGH (ref 8–23)
CO2: 22 mmol/L (ref 22–32)
Calcium: 8 mg/dL — ABNORMAL LOW (ref 8.9–10.3)
Chloride: 111 mmol/L (ref 98–111)
Creatinine, Ser: 2.09 mg/dL — ABNORMAL HIGH (ref 0.61–1.24)
GFR calc Af Amer: 30 mL/min — ABNORMAL LOW (ref >60)
GFR calc non Af Amer: 26 mL/min — ABNORMAL LOW (ref >60)
Glucose, Bld: 106 mg/dL — ABNORMAL HIGH (ref 70–99)
Potassium: 3.7 mmol/L (ref 3.5–5.1)
Sodium: 143 mmol/L (ref 135–145)

## 2019-11-04 LAB — LACTIC ACID, PLASMA
Lactic Acid, Venous: 1.4 mmol/L (ref 0.5–1.9)
Lactic Acid, Venous: 1.2 mmol/L (ref 0.5–1.9)

## 2019-11-04 LAB — PROCALCITONIN: Procalcitonin: 20.75 ng/mL

## 2019-11-04 LAB — APTT
aPTT: 183 seconds (ref 24–36)
aPTT: 164 seconds (ref 24–36)
aPTT: 104 seconds — ABNORMAL HIGH (ref 24–36)
aPTT: 90 seconds — ABNORMAL HIGH (ref 24–36)

## 2019-11-04 LAB — HEPARIN LEVEL (UNFRACTIONATED)
Heparin Unfractionated: 1.12 IU/mL — ABNORMAL HIGH (ref 0.30–0.70)
Heparin Unfractionated: 0.89 IU/mL — ABNORMAL HIGH (ref 0.30–0.70)

## 2019-11-04 MED ORDER — DEXTROSE-NACL 5-0.45 % IV SOLN: INTRAVENOUS | Status: DC

## 2019-11-04 NOTE — Progress Notes (Signed)
Speech Language Pathology Treatment: Dysphagia  Patient Details Name: Robert Mcconnell MRN: [id number] DOB: [birth year] Today's Date: 11/04/2019 Time: 4765-4650 SLP Time Calculation (min) (ACUTE ONLY): 13 min  Assessment / Plan / Recommendation Clinical Impression  Visited pt at end of meal, wife feeding, pt tolerating well, but mostly choosing the puddings and sweets. Wife verbalized understanding of lingual sweep, says she often given a liquid wash to clear. Assisted wife in ordering next few meals with appropriate texture choices and requesting extra gravy or sauce given that she feels food is too dry. Discussed speech needs given pts order for SLE. Wife reports pt is at baseline, has ahd home health and OP SLP sessions since CVA. Recommend f/u at next level of care to address speech if desired by wife. No acute f/u needed at this time.   HPI HPI: 86 y.o. male with medical history significant of CAD s/p stent; CVA; and stage 4 CKD presenting with fever.  He seemed to be ok but had a bit of blood on his penis after urination about 3 days ago.  He was eating well and then started with chills and high fever and then started vomiting.  He was unable to keep Tylenol down.  Fever was up to 102-103.  Things significantly worsened last night.  He has hemiparesis and speech difficulty due to CVA.       SLP Plan  All goals met       Recommendations  Diet recommendations: Dysphagia 2 (fine chop);Thin liquid Liquids provided via: Cup;Straw Medication Administration: Crushed with puree Supervision: Staff to assist with self feeding;Full supervision/cueing for compensatory strategies Compensations: Follow solids with liquid;Lingual sweep for clearance of pocketing                Oral Care Recommendations: Oral care BID Follow up Recommendations: 24 hour supervision/assistance SLP Visit Diagnosis: Dysphagia, oropharyngeal phase (R13.12) Plan: All goals met       GO                 Robert Mcconnell, Katherene Ponto 11/04/2019, 1:13 PM

## 2019-11-04 NOTE — Progress Notes (Signed)
ANTICOAGULATION CONSULT NOTE - Follow Up Consult  Pharmacy Consult for heparin Indication: atrial fibrillation  Labs: Recent Labs    11/02/19 2315 11/02/19 2315 11/03/19 8938 11/04/19 0012 11/04/19 0012 11/04/19 0240 11/04/19 0936 11/04/19 1333 11/04/19 2234  HGB 11.5*   < > 9.8*  --   --  9.8*  --   --   --   HCT 37.9*  --  32.2*  --   --  32.4*  --   --   --   PLT 99*  --  90*  --   --  84*  --   --   --   APTT 31   < >  --  183*   < >  --  164* 104* 90*  LABPROT 15.5*  --   --   --   --   --   --   --   --   INR 1.3*  --   --   --   --   --   --   --   --   HEPARINUNFRC  --   --   --  1.12*  --   --  0.89*  --   --   CREATININE 2.32*  --  2.08*  --   --  2.09*  --   --   --   TROPONINIHS  --   --  64*  --   --   --   --   --   --    < > = values in this interval not displayed.    Assessment/Plan:  86yo male therapeutic on heparin after rate changes. Will continue gtt at current rate and confirm stable with am labs.   Wynona Neat, PharmD, BCPS  11/04/2019,11:03 PM

## 2019-11-04 NOTE — Progress Notes (Signed)
ANTICOAGULATION CONSULT NOTE  Pharmacy Consult for Eliquis to Heparin Indication: atrial fibrillation  No Known Allergies  Patient Measurements: Height: 5\' 9"  (175.3 cm) Weight: 74.6 kg (164 lb 6.4 oz) IBW/kg (Calculated) : 70.7  Vital Signs: Temp: 97.4 F (36.3 C) (06/29 1042) Temp Source: Axillary (06/29 1042) BP: 133/79 (06/29 0708) Pulse Rate: 64 (06/29 1042)  Labs: Recent Labs    11/02/19 2315 11/02/19 2315 11/03/19 8325 11/04/19 0012 11/04/19 0240 11/04/19 0936  HGB 11.5*   < > 9.8*  --  9.8*  --   HCT 37.9*  --  32.2*  --  32.4*  --   PLT 99*  --  90*  --  84*  --   APTT 31  --   --  183*  --  164*  LABPROT 15.5*  --   --   --   --   --   INR 1.3*  --   --   --   --   --   HEPARINUNFRC  --   --   --  1.12*  --  0.89*  CREATININE 2.32*  --  2.08*  --  2.09*  --   TROPONINIHS  --   --  64*  --   --   --    < > = values in this interval not displayed.    Estimated Creatinine Clearance: 21.1 mL/min (A) (by C-G formula based on SCr of 2.09 mg/dL (H)).  Assessment: 86 year old male to begin heparin while NPO and Eliquis on hold for Afib.  Heparin level came back falsely elevated at 0.89, aPTT came back supratherapeutic at 164. Hgb 9.8, plt 84. No s/sx of bleeding or infusion issues. Heparin level is drawn from same side (confirmed with lab) - heparin is infusing in R IV. Redrawn from opposite side and aPTT came back at 104, supratherapeutic.   Goal of Therapy:  Heparin level = 0.3-0.7 aPTT 66-102 seconds seconds Monitor platelets by anticoagulation protocol: Yes     Plan:  Reduce heparin infusion to 750 units / hr Heparin level and PTT 8 hours after heparin starts Daily heparin level, PTT, CBC  Thank you  Antonietta Jewel, PharmD, [place] Clinical Pharmacist  Phone: [phone] 11/04/2019 12:03 PM  Please check AMION for all [place] phone numbers After 10:00 PM, call Ruth [phone]

## 2019-11-04 NOTE — Progress Notes (Signed)
[place] for Eliquis to Heparin Indication: atrial fibrillation  Assessment: 86 year old male to begin heparin while NPO and Eliquis on hold for Afib Last dose of Eliquis 6/27 Initial heparin level 1.12 units/ml  APTT 183 sec  Goal of Therapy:  aPTT 66-102 seconds seconds Monitor platelets by anticoagulation protocol: Yes  Heparin level = 0.3-0.7   Plan:  Decrease heparin drip to 850 units / hr Heparin level and PTT 8 hours after rate change Daily heparin level, PTT, CBC  Thank you Excell Seltzer, PharmD  11/04/2019,2:01 AM

## 2019-11-04 NOTE — Evaluation (Signed)
Physical Therapy Evaluation Patient Details Name: Robert Mcconnell MRN: [id number] DOB: Sep 17, 1921 Today's Date: 11/04/2019   History of Present Illness  86 yo admitted with fever, UTI and sepsis. PMHx: CAD, CVA with hemiparesis and aphasia, CKD stage 4  Clinical Impression  Pt supine on arrival with wife present and family reports pt is assisted by daughter or caregiver at home to perform all transfers. Pt had a CVA in feb and apparently went from a hospital in [place] to daughter's home locally via [facility] assist at airport. Pt currently is max +2 assist for pivot transfers, hoyer lift at home is broken and spouse is unable to assist. PT with significant hemiparesis, impaired cognition and balance who will benefit from acute therapy to maximize mobility and safety to decrease caregiver burden and injury prevention.     Follow Up Recommendations SNF;Supervision/Assistance - 24 hour    Equipment Recommendations       Recommendations for Other Services       Precautions / Restrictions Precautions Precautions: Fall Precaution Comments: right hemiparesis, posterior lean in standing, aphasia      Mobility  Bed Mobility Overal bed mobility: Needs Assistance Bed Mobility: Supine to Sit     Supine to sit: Max assist;+2 for safety/equipment     General bed mobility comments: max assist to pivot toward left side of bed with sequential cues, assist to elevate trunk and move legs fully to the side with pt assisting with RUE on rail to sit  Transfers Overall transfer level: Needs assistance   Transfers: Sit to/from Stand;Stand Pivot Transfers Sit to Stand: Max assist;+2 physical assistance Stand pivot transfers: Max assist;+2 physical assistance       General transfer comment: max +2 physical assist with max cues and bil feet blocked with belt and pad to partially stand initially with pt maintaining posterior lean and sat back down. Max cues for anterior translation for full stand and pivot  toward left to chair. Maximove lift pad placed for return to bed  Ambulation/Gait             General Gait Details: unable  Stairs            Wheelchair Mobility    Modified Rankin (Stroke Patients Only) Modified Rankin (Stroke Patients Only) Pre-Morbid Rankin Score: Severe disability Modified Rankin: Severe disability     Balance Overall balance assessment: Needs assistance   Sitting balance-Leahy Scale: Fair Sitting balance - Comments: pt able to sit EOB with minguard with good posture and midline positioning     Standing balance-Leahy Scale: Zero Standing balance comment: heavy max +2 assist in standing                             Pertinent Vitals/Pain Pain Assessment: No/denies pain    Home Living Family/patient expects to be discharged to:: Private residence Living Arrangements: Spouse/significant other Available Help at Discharge: Family Type of Home: House Home Access: Ramped entrance     Home Layout: Two level Home Equipment: Wheelchair - power;Hospital bed;Other (comment) (hoyer) Additional Comments: reports hoyer currently doesn't have working batteries    Prior Function Level of Independence: Needs assistance   Gait / Transfers Assistance Needed: physical assist for transfers to power w/c, spends 4-5hrs per day in the w/c, aide or daughter assists with transfers (hoyer currently not working), transfers to power w/c > stair lift > w/c   ADL's / Homemaking Assistance Needed: assist for ADL, sponge bathes  Comments: currently going to outpt 2x/wk      Hand Dominance        Extremity/Trunk Assessment   Upper Extremity Assessment Upper Extremity Assessment: Defer to OT evaluation    Lower Extremity Assessment Lower Extremity Assessment: RLE deficits/detail RLE Deficits / Details: grossly 1/5 with minor knee flexion noted in supine with cues for flexion, trace hip flexion    Cervical / Trunk Assessment Cervical / Trunk  Assessment: Normal  Communication   Communication: Expressive difficulties  Cognition Arousal/Alertness: Awake/alert Behavior During Therapy: WFL for tasks assessed/performed Overall Cognitive Status: Impaired/Different from baseline Area of Impairment: Memory;Safety/judgement;Following commands                     Memory: Decreased short-term memory Following Commands: Follows one step commands inconsistently Safety/Judgement: Decreased awareness of safety;Decreased awareness of deficits     General Comments: pt with decreased awareness of how he transfers, what assist he requires, decreased ability to follow commands for anterior lean and assist with standing      General Comments      Exercises     Assessment/Plan    PT Assessment Patient needs continued PT services  PT Problem List Decreased strength;Decreased mobility;Decreased safety awareness;Decreased coordination;Decreased activity tolerance;Decreased cognition;Decreased balance;Decreased knowledge of use of DME;Impaired sensation       PT Treatment Interventions DME instruction;Therapeutic exercise;Functional mobility training;Therapeutic activities;Patient/family education;Cognitive remediation;Balance training;Neuromuscular re-education    PT Goals (Current goals can be found in the Care Plan section)  Acute Rehab PT Goals Patient Stated Goal: return home PT Goal Formulation: With patient/family Time For Goal Achievement: 11/18/19 Potential to Achieve Goals: Fair    Frequency Min 2X/week   Barriers to discharge Decreased caregiver support      Co-evaluation               AM-PAC PT "6 Clicks" Mobility  Outcome Measure Help needed turning from your back to your side while in a flat bed without using bedrails?: Total Help needed moving from lying on your back to sitting on the side of a flat bed without using bedrails?: Total Help needed moving to and from a bed to a chair (including a  wheelchair)?: Total Help needed standing up from a chair using your arms (e.g., wheelchair or bedside chair)?: Total Help needed to walk in hospital room?: Total Help needed climbing 3-5 steps with a railing? : Total 6 Click Score: 6    End of Session Equipment Utilized During Treatment: Gait belt Activity Tolerance: Patient tolerated treatment well Patient left: in chair;with call bell/phone within reach;with chair alarm set;with family/visitor present Nurse Communication: Mobility status;Need for lift equipment;Precautions PT Visit Diagnosis: Other abnormalities of gait and mobility (R26.89);Difficulty in walking, not elsewhere classified (R26.2);Other symptoms and signs involving the nervous system (R29.898);Hemiplegia and hemiparesis Hemiplegia - Right/Left: Right Hemiplegia - caused by: Cerebral infarction    Time: 1005-1034 PT Time Calculation (min) (ACUTE ONLY): 29 min   Charges:   PT Evaluation $PT Eval Moderate Complexity: 1 Mod          Halen Mossbarger P, PT Acute Rehabilitation Services Pager: [phone] Office: [place] 11/04/2019, 1:41 PM

## 2019-11-04 NOTE — Progress Notes (Signed)
  Echocardiogram 2D Echocardiogram has been performed.  Robert Mcconnell 11/04/2019, 2:29 PM

## 2019-11-04 NOTE — Progress Notes (Signed)
PROGRESS NOTE    Robert Mcconnell  WFU:[id number] DOB: June 05, 1921 DOA: 11/02/2019 PCP: [name], MD     Brief Narrative:  Robert Mcconnell is a 86 y.o. male with medical history significant of CAD s/p stent; CVA February with residual right hemiparesis; and stage 4 CKD presenting with fever.  He seemed to be ok but had a bit of blood on his penis after urination about 3 days ago.  He was eating well and then started with chills and high fever and then started vomiting.  He was unable to keep Tylenol down.  Fever was up to 102-103.  Things significantly worsened last night.  He has hemiparesis and speech difficulty due to CVA.  No h/o nephrolithiasis.  He does have CKD and he would not pursue HD.  No sick contacts.  No pain.    ED Course: [facility] to [facility] transfer, per Dr. Hal Hope:  86 year old male with history of chronic kidney disease stage IV A. fib recently moved from [place] lives with his daughter was found to have increased fever of 102 F and some dysuria since last 24 hours. Brought to the ER is found to be hypotensive initially improved with fluids still confused UA consider UTI chest x-ray shows possibility of pneumonia empiric antibiotic started admitted for sepsis likely from UTI. Patient is a DNR. Does not want dialysis.   Subjective: 6/29 hypothermic overnight A/O x4, negative CP, negative abdominal pain, perceived S OB.   Assessment & Plan: Covid vaccination;   Principal Problem:   Sepsis secondary to UTI [facility]) Active Problems:   History of ischemic stroke   CAD (coronary artery disease)   CKD (chronic kidney disease) stage 4, GFR 15-29 ml/min (HCC)   Dysphagia as late effect of cerebrovascular accident (CVA)   Acute renal failure superimposed on stage 4 chronic kidney disease (HCC)   Right hemiparesis (HCC)   Atrial fibrillation, chronic (HCC)   Severe sepsis (HCC)   CAP (community acquired pneumonia)   Dysphagia   Severe sepsis/sepsis UTI  positive Klebsiella pneumoniae/Lactobacillus vs CAP -Meets criteria for sepsis temp<36C, RR> 20, lactic acid> 2, acute on CKD stage IV, recurrent hypotension with appropriate hydration. -Cultures pending -Sepsis protocol initiated -Patient had SBP <90/MAP <65 and so has received the 30 cc/kg IVF bolus. -Suspected source is UTI - patient had report of blood at the end of his penis following urination on the days leading up to the issue and UA is abnormal -Renal US obtained to ensure that there was no obstructed stone involved and was negative for nephrolithiasis -Empiric antibiotics for CAP initiated which will also cover UTI -Trend lactic acid and procalcitonin -Flutter valve -Incentive spirometry -D5-0.45% saline@50ml /hr; patient with severe dysphagia  Dysphagia -Since his CVA his wife notes intermittent difficulty with dysphagia -CXR with some concern bibasilar opacities -Will continue CAP coverage, as noted above --Dysphagia 2 diet -Initiate aspiration precautions l  H/o CVA -Ongoing marked deficits with R hemiparesis, R facial droop, dysphagia, and dysarthria -PT/OT consults requested -He is still undergoing therapy through rehab -Continue Remeron -Hx stroke in February 2021; no echocardiogram on file -6/29 echocardiogram pending   Acute on stage 4 CKD (baseline Cr 2.32) -Patient does not desire HD Recent Labs  Lab 11/02/19 2315 11/03/19 0643 11/04/19 0240  CREATININE 2.32* 2.08* 2.09*  -Avoid all nephrotoxic medication -Gentle hydration -6/29 switch to D5-0.45% saline 50 ml/hr unsure of patient's nutritional intake given his severe dysphagia  CAD/Afib chronic -Was on Eliquis at home (hold) -Heparin drip,  -LIpitor  40 mg daily -Lipid panel pending  BPH -Finasteride 5 mg daily -Terazosin 5 mg daily     DVT prophylaxis: Heparin drip Code Status: DNR Family Communication: 6/29 wife at bedside for discussion of plan of care Status is:  Inpatient    Dispo: The patient is from: Home              Anticipated d/c is to: SNF              Anticipated d/c date is: 7/5              Patient currently unstable septic      Consultants:    Procedures/Significant Events:  6/27 PCXR;-cardiomegaly with vascular congestion. Probable small pleural effusions. -Patchy bibasilar opacities, RIGHT >>> LEFT pneumonia in the setting of fever, vs atelectasis or asymmetric pulmonary edema is also considered.   I have personally reviewed and interpreted all radiology studies and my findings are as above.  VENTILATOR SETTINGS:    Cultures 6/27 blood Left Arm NGTD 6/27 blood RIGHT forearm NGTD 6/28 positive Klebsiella pneumoniae/Lactobacillus 6/29 sputum culture pending    Antimicrobials: Anti-infectives (From admission, onward)   Start     Ordered Stop   11/04/19 0130  azithromycin (ZITHROMAX) 500 mg in sodium chloride 0.9 % 250 mL IVPB     Discontinue     11/03/19 0856     11/03/19 0130  azithromycin (ZITHROMAX) 500 mg in sodium chloride 0.9 % 250 mL IVPB        11/03/19 0120 11/03/19 0300   11/03/19 0045  azithromycin (ZITHROMAX) 200 MG/5ML suspension 500 mg  Status:  Discontinued        11/03/19 0041 11/03/19 0122   11/02/19 2330  cefTRIAXone (ROCEPHIN) 1 g in sodium chloride 0.9 % 100 mL IVPB     Discontinue     11/02/19 2328         Devices    LINES / TUBES:      Continuous Infusions: . sodium chloride 10 mL/hr at 11/03/19 0128  . azithromycin (ZITHROMAX) 500 MG IVPB (Vial-Mate Adaptor) 500 mg (11/04/19 0120)  . cefTRIAXone (ROCEPHIN)  IV 1 g (11/03/19 2236)  . dextrose 5 % and 0.45% NaCl    . heparin 750 Units/hr (11/04/19 1442)     Objective: Vitals:   11/04/19 0300 11/04/19 0708 11/04/19 1042 11/04/19 1614  BP:  133/79 131/90 (!) 151/94  Pulse: (!) 53 60 64 70  Resp: 13 19 13 20   Temp:  (!) 97.3 F (36.3 C) (!) 97.4 F (36.3 C) 97.6 F (36.4 C)  TempSrc:  Axillary Axillary Oral  SpO2:  100% 100% 98% 94%  Weight:      Height:        Intake/Output Summary (Last 24 hours) at 11/04/2019 1735 Last data filed at 11/04/2019 1600 Gross per 24 hour  Intake 3502.1 ml  Output 900 ml  Net 2602.1 ml   Filed Weights   11/02/19 2315 11/03/19 0053  Weight: 71.8 kg 74.6 kg    Examination: General: A/O x4, no acute respiratory distress Eyes: negative scleral hemorrhage, negative anisocoria, negative icterus ENT: Negative Runny nose, negative gingival bleeding, Neck:  Negative scars, masses, torticollis, lymphadenopathy, JVD Lungs: Clear to auscultation bilaterally without wheezes or crackles Cardiovascular: Regular rate and rhythm without murmur gallop or rub normal S1 and S2 Abdomen: negative abdominal pain, nondistended, positive soft, bowel sounds, no rebound, no ascites, no appreciable mass Extremities: No significant cyanosis, clubbing, or edema bilateral lower extremities Skin:  Negative rashes, lesions, ulcers Psychiatric:  Negative depression, negative anxiety, negative fatigue, negative mania  Central nervous system:  Cranial nerves II through XII intact, tongue/uvula midline, all extremities muscle strength 5/5 on LEFT side, RIGHT hemiparesis, sensation intact throughout, positive dysarthria, negative expressive aphasia, negative receptive aphasia.  .     Data Reviewed: Care during the described time interval was provided by me .  I have reviewed this patient's available data, including medical history, events of note, physical examination, and all test results as part of my evaluation.  CBC: Recent Labs  Lab 11/02/19 2315 11/03/19 0643 11/04/19 0240  WBC 9.1 10.5 5.9  NEUTROABS 8.2* 9.0*  --   HGB 11.5* 9.8* 9.8*  HCT 37.9* 32.2* 32.4*  MCV 99.5 99.7 99.1  PLT 99* 90* 84*   Basic Metabolic Panel: Recent Labs  Lab 11/02/19 2315 11/03/19 0643 11/04/19 0240  NA 143 144 143  K 3.9 4.0 3.7  CL 109 112* 111  CO2 22 24 22   GLUCOSE 179* 159* 106*  BUN 52*  49* 49*  CREATININE 2.32* 2.08* 2.09*  CALCIUM 8.0* 8.1* 8.0*   GFR: Estimated Creatinine Clearance: 21.1 mL/min (A) (by C-G formula based on SCr of 2.09 mg/dL (H)). Liver Function Tests: Recent Labs  Lab 11/02/19 2315 11/03/19 0643  AST 25 25  ALT 18 19  ALKPHOS 88 76  BILITOT 1.0 0.8  PROT 6.4* 5.6*  ALBUMIN 2.7* 2.2*   No results for input(s): LIPASE, AMYLASE in the last 168 hours. No results for input(s): AMMONIA in the last 168 hours. Coagulation Profile: Recent Labs  Lab 11/02/19 2315  INR 1.3*   Cardiac Enzymes: No results for input(s): CKTOTAL, CKMB, CKMBINDEX, TROPONINI in the last 168 hours. BNP (last 3 results) No results for input(s): PROBNP in the last 8760 hours. HbA1C: No results for input(s): HGBA1C in the last 72 hours. CBG: No results for input(s): GLUCAP in the last 168 hours. Lipid Profile: No results for input(s): CHOL, HDL, LDLCALC, TRIG, CHOLHDL, LDLDIRECT in the last 72 hours. Thyroid Function Tests: Recent Labs    11/03/19 0643  TSH 3.379   Anemia Panel: No results for input(s): VITAMINB12, FOLATE, FERRITIN, TIBC, IRON, RETICCTPCT in the last 72 hours. Sepsis Labs: Recent Labs  Lab 11/03/19 2330 11/03/19 0839 11/03/19 0902 11/04/19 1035 11/04/19 1250  PROCALCITON 18.11  --  1.02 20.75  --   LATICACIDVEN 1.7 1.2  --  1.4 1.2    Recent Results (from the past 240 hour(s))  Blood Culture (routine x 2)     Status: None (Preliminary result)   Collection Time: 11/02/19 11:15 PM   Specimen: BLOOD LEFT ARM  Result Value Ref Range Status   Specimen Description   Final    BLOOD LEFT ARM Performed at [facility], [address] [place], [place] [address]    Special Requests   Final    BOTTLES DRAWN AEROBIC AND ANAEROBIC Blood Culture adequate volume Performed at [facility], [address] [place], [place] [address]    Culture   Final    NO GROWTH < 24 HOURS Performed at [facility], [place] [address], [place], [place] [address]    Report Status PENDING  Incomplete  Blood Culture (routine x 2)     Status: None (Preliminary result)   Collection Time: 11/02/19 11:20 PM   Specimen: BLOOD RIGHT FOREARM  Result Value Ref Range Status   Specimen Description   Final  BLOOD RIGHT FOREARM Performed at [facility], [address] [place], [place] [address]    Special Requests   Final    BOTTLES DRAWN AEROBIC AND ANAEROBIC Blood Culture adequate volume Performed at [facility], [address] [place], [place] [address]    Culture   Final    NO GROWTH < 24 HOURS Performed at [facility], [place] [address], [place], [place] [address]    Report Status PENDING  Incomplete  Urine culture     Status: Abnormal (Preliminary result)   Collection Time: 11/03/19 12:41 AM   Specimen: In/Out Cath Urine  Result Value Ref Range Status   Specimen Description   Final    IN/OUT CATH URINE Performed at [facility], [address] [place], [place] [address]    Special Requests   Final    NONE Performed at [facility], [address] [place], [place] [address]    Culture (A)  Final    >=100,000 COLONIES/mL KLEBSIELLA PNEUMONIAE SUSCEPTIBILITIES TO FOLLOW >=100,000 COLONIES/mL LACTOBACILLUS SPECIES Standardized susceptibility testing for this organism is not available. Performed at [facility], [place] [address] [place], [place] [address]    Report Status PENDING  Incomplete  SARS Coronavirus 2 by RT PCR (hospital order, performed in [facility] hospital lab) Nasopharyngeal Nasopharyngeal Swab     Status: None   Collection Time: 11/03/19 12:41 AM   Specimen: Nasopharyngeal Swab  Result Value Ref Range Status   SARS Coronavirus 2 NEGATIVE NEGATIVE Final    Comment: (NOTE) SARS-CoV-2 target nucleic acids are NOT DETECTED.  The SARS-CoV-2 RNA is generally detectable in upper and lower respiratory specimens during the acute phase of  infection. The lowest concentration of SARS-CoV-2 viral copies this assay can detect is 250 copies / mL. A negative result does not preclude SARS-CoV-2 infection and should not be used as the sole basis for treatment or other patient management decisions.  A negative result may occur with improper specimen collection / handling, submission of specimen other than nasopharyngeal swab, presence of viral mutation(s) within the areas targeted by this assay, and inadequate number of viral copies (<250 copies / mL). A negative result must be combined with clinical observations, patient history, and epidemiological information.  Fact Sheet for Patients:   StrictlyIdeas.no  Fact Sheet for Healthcare Providers: BankingDealers.co.za  This test is not yet approved or  cleared by the [place] FDA and has been authorized for detection and/or diagnosis of SARS-CoV-2 by FDA under an Emergency Use Authorization (EUA).  This EUA will remain in effect (meaning this test can be used) for the duration of the COVID-19 declaration under Section 564(b)(1) of the Act, 21 U.S.C. section 360bbb-3(b)(1), unless the authorization is terminated or revoked sooner.  Performed at [facility], [address] [place], [place] [address]   MRSA PCR Screening     Status: None   Collection Time: 11/03/19  5:25 AM   Specimen: Nasopharyngeal  Result Value Ref Range Status   MRSA by PCR NEGATIVE NEGATIVE Final    Comment:        The GeneXpert MRSA Assay (FDA approved for NASAL specimens only), is one component of a comprehensive MRSA colonization surveillance program. It is not intended to diagnose MRSA infection nor to guide or monitor treatment for MRSA infections. Performed at [facility], [place] [address], [place], [place] [address]  Radiology Studies: CT HEAD WO CONTRAST  Result Date: 11/03/2019 CLINICAL DATA:  Fever and  UTI.  Encephalopathy EXAM: CT HEAD WITHOUT CONTRAST TECHNIQUE: Contiguous axial images were obtained from the base of the skull through the vertex without intravenous contrast. COMPARISON:  None. FINDINGS: Brain: Remote left MCA branch infarct affecting the frontal operculum and insula. Remote perforator infarct at the left basal ganglia and corona radiata. Mild for age chronic small vessel ischemia. No evidence of acute infarct, hemorrhage, hydrocephalus, or mass. Vascular: No hyperdense vessel or unexpected calcification. Skull: Normal. Negative for fracture or focal lesion. Sinuses/Orbits: No acute finding. IMPRESSION: 1. No acute or reversible finding. 2. Remote left MCA branch infarcts. Electronically Signed   By: Monte Fantasia M.D.   On: 11/03/2019 06:34   US RENAL  Result Date: 11/03/2019 CLINICAL DATA:  Sepsis. EXAM: RENAL / URINARY TRACT ULTRASOUND COMPLETE COMPARISON:  None. FINDINGS: Right Kidney: Renal measurements: 10.6 x 5.0 x 4.1 cm = volume: 112.9 mL. Renal cortical thinning and increased echogenicity suggesting medical renal disease. Simple appearing 7 cm cyst projecting off the upper pole region of the right kidney. Septated cyst measuring 4.5 x 4.5 x 4.8 cm projecting off the inferior and medial aspect of the right kidney. No hydronephrosis. Left Kidney: Renal measurements: 9.1 x 4.6 x 4.0 cm = volume: 86.4 mL. Renal cortical thinning and increased echogenicity suggesting medical renal disease. 1.3 cm simple appearing cyst. No hydronephrosis. Bladder: The bladder demonstrates mild trabeculation and may be due to mild bladder outlet obstruction from a slightly enlarged prostate gland. No bladder mass or calculi. Other: None. IMPRESSION: 1. Bilateral renal cysts. 2. Renal cortical thinning and increased echogenicity suggesting medical renal disease. 3. Mild prostate gland enlargement and mild trabeculation of the bladder. Electronically Signed   By: Marijo Sanes M.D.   On: 11/03/2019 10:33    DG Chest Port 1 View  Result Date: 11/02/2019 CLINICAL DATA:  Fever. EXAM: PORTABLE CHEST 1 VIEW COMPARISON:  None. FINDINGS: Right chest port with tip in the SVC. Cardiomegaly with aortic atherosclerosis and tortuosity. Probable calcified mediastinal lymph node. Mild vascular congestion. Patchy bibasilar opacities, right greater than left. Small pleural effusions suspected. No pneumothorax. No acute osseous abnormalities are seen. IMPRESSION: 1. Cardiomegaly with vascular congestion. Probable small pleural effusions. 2. Patchy bibasilar opacities, right greater than left, may represent pneumonia in the setting of fever, alternatively atelectasis or asymmetric pulmonary edema is also considered. Aortic Atherosclerosis (ICD10-I70.0). Electronically Signed   By: Keith Rake M.D.   On: 11/02/2019 23:56   ECHOCARDIOGRAM COMPLETE  Result Date: 11/04/2019    ECHOCARDIOGRAM REPORT   Patient Name:   Robert Mcconnell Date of Exam: 11/04/2019 Medical Rec #:  [id number]      Height:       69.0 in Accession #:    [id number]     Weight:       164.4 lb Date of Birth:  [birth year]      BSA:          1.901 m Patient Age:    89 years       BP:           131/90 mmHg Patient Gender: M              HR:           68 bpm. Exam Location:  Inpatient Procedure: 2D Echo Indications:    stroke 434.91  History:        Patient has no prior history  of Echocardiogram examinations.                 CAD; chronic kidney disease.  Sonographer:    Johny Chess Referring Phys: [id number] Marks  1. Left ventricular ejection fraction, by estimation, is 30 to 35%. The left ventricle has moderate to severely decreased function. The left ventricle demonstrates regional wall motion abnormalities (see scoring diagram/findings for description). The left ventricular internal cavity size was mildly dilated. Left ventricular diastolic function could not be evaluated. There is severe hypokinesis of the left ventricular, entire  inferior wall, inferolateral wall and inferoseptal wall.  2. Right ventricular systolic function is mildly reduced. The right ventricular size is normal. There is moderately elevated pulmonary artery systolic pressure.  3. Left atrial size was severely dilated.  4. Right atrial size was severely dilated.  5. The mitral valve is normal in structure. Mild to moderate mitral valve regurgitation.  6. Tricuspid valve regurgitation is mild to moderate.  7. The aortic valve is tricuspid. Aortic valve regurgitation is trivial. Mild to moderate aortic valve sclerosis/calcification is present, without any evidence of aortic stenosis.  8. Aortic dilatation noted. There is mild dilatation of the ascending aorta measuring 44 mm.  9. The inferior vena cava is dilated in size with <50% respiratory variability, suggesting right atrial pressure of 15 mmHg. FINDINGS  Left Ventricle: Left ventricular ejection fraction, by estimation, is 30 to 35%. The left ventricle has moderate to severely decreased function. The left ventricle demonstrates regional wall motion abnormalities. Severe hypokinesis of the left ventricular, entire inferior wall, inferolateral wall and inferoseptal wall. The left ventricular internal cavity size was mildly dilated. There is no left ventricular hypertrophy. Left ventricular diastolic function could not be evaluated due to atrial fibrillation. Left ventricular diastolic function could not be evaluated. Right Ventricle: The right ventricular size is normal. No increase in right ventricular wall thickness. Right ventricular systolic function is mildly reduced. There is moderately elevated pulmonary artery systolic pressure. The tricuspid regurgitant velocity is 2.97 m/s, and with an assumed right atrial pressure of 15 mmHg, the estimated right ventricular systolic pressure is 47.6 mmHg. Left Atrium: Left atrial size was severely dilated. Right Atrium: Right atrial size was severely dilated. Pericardium: There  is no evidence of pericardial effusion. Mitral Valve: The mitral valve is normal in structure. Mild mitral annular calcification. Mild to moderate mitral valve regurgitation, with centrally-directed jet. Tricuspid Valve: The tricuspid valve is normal in structure. Tricuspid valve regurgitation is mild to moderate. Aortic Valve: The aortic valve is tricuspid. . There is moderate thickening and mild calcification of the aortic valve. Aortic valve regurgitation is trivial. Mild to moderate aortic valve sclerosis/calcification is present, without any evidence of aortic stenosis. There is moderate thickening of the aortic valve. There is mild calcification of the aortic valve. Pulmonic Valve: The pulmonic valve was grossly normal. Pulmonic valve regurgitation is trivial. Aorta: Aortic dilatation noted. There is mild dilatation of the ascending aorta measuring 44 mm. Venous: The inferior vena cava is dilated in size with less than 50% respiratory variability, suggesting right atrial pressure of 15 mmHg. IAS/Shunts: No atrial level shunt detected by color flow Doppler.  LEFT VENTRICLE PLAX 2D LVIDd:         6.30 cm LVIDs:         5.50 cm LV PW:         1.10 cm LV IVS:        1.00 cm LVOT diam:  2.30 cm LVOT Area:     4.15 cm  IVC IVC diam: 2.50 cm LEFT ATRIUM             Index       RIGHT ATRIUM           Index LA diam:        4.40 cm 2.31 cm/m  RA Area:     17.10 cm LA Vol (A2C):   87.2 ml 45.87 ml/m RA Volume:   42.80 ml  22.52 ml/m LA Vol (A4C):   68.2 ml 35.88 ml/m LA Biplane Vol: 76.4 ml 40.19 ml/m   AORTA Ao Root diam: 4.30 cm Ao Asc diam:  4.40 cm TRICUSPID VALVE TR Peak grad:   35.3 mmHg TR Vmax:        297.00 cm/s  SHUNTS Systemic Diam: 2.30 cm Mihai Croitoru MD Electronically signed by Sanda Klein MD Signature Date/Time: 11/04/2019/2:36:02 PM    Final         Scheduled Meds: . atorvastatin  40 mg Oral BID  . calcitRIOL  2.25 mcg Oral Q M,W,F  . docusate sodium  100 mg Oral BID  . famotidine   20 mg Oral Daily  . finasteride  5 mg Oral Daily  . melatonin  9 mg Oral QHS  . mirtazapine  15 mg Oral QHS  . senna  1 tablet Oral Daily  . sodium chloride flush  3 mL Intravenous Q12H  . terazosin  5 mg Oral QHS   Continuous Infusions: . sodium chloride 10 mL/hr at 11/03/19 0128  . azithromycin (ZITHROMAX) 500 MG IVPB (Vial-Mate [employer]or) 500 mg (11/04/19 0120)  . cefTRIAXone (ROCEPHIN)  IV 1 g (11/03/19 2236)  . dextrose 5 % and 0.45% NaCl    . heparin 750 Units/hr (11/04/19 1442)     LOS: 1 day    Time spent:40 min     Grosser, Geraldo Docker, MD [facility] Pager [phone]  If 7PM-7AM, please contact night-coverage [email or web address] Password [facility] 11/04/2019, 5:35 PM

## 2019-11-04 NOTE — CV Procedure (Signed)
Echocardiogram not completed, patient is sitting upright in chair and just received lunch.  Darlina Sicilian [facility] 11/04/19 12:41

## 2019-11-04 NOTE — Plan of Care (Signed)

## 2019-11-04 NOTE — Evaluation (Addendum)
Occupational Therapy Evaluation Patient Details Name: Robert Mcconnell MRN: [id number] DOB: 03/11/1922 Today's Date: 11/04/2019    History of Present Illness 86 yo admitted with fever, UTI and sepsis. PMHx: CAD, CVA with hemiparesis and aphasia, CKD stage 4   Clinical Impression   This 86 y/o male presents with the above. PTA pt living at home with spouse/family, was receiving assist for all ADL, as of late has been receiving physical assist for transfers to power wheelchair (hoyer batteries not working) as well as to Theatre manager as pt's bedroom upstairs. Pt currently presenting with weakness including baseline R side hemiparesis, impaired cognition and decreased mobility status. He required two person assist for safe completion of bed mobility and stand pivot transfer to drop arm recliner today. Pt with heavy posterior/R lateral lean with standing as well as difficulty with command following/motor planning movements. Pt to benefit from continued acute OT services and currently recommend follow up therapy services in SNF setting to maximize his overall safety and independence with ADL and mobility, as well as to decrease level of caregiver burden, prior to return home.     Follow Up Recommendations  SNF;Supervision/Assistance - 24 hour    Equipment Recommendations  Other (comment) (TBD; hoyer)           Precautions / Restrictions Precautions Precautions: Fall Precaution Comments: right hemiparesis, posterior lean in standing, aphasia Restrictions Weight Bearing Restrictions: No      Mobility Bed Mobility Overal bed mobility: Needs Assistance Bed Mobility: Supine to Sit     Supine to sit: Max assist;+2 for safety/equipment     General bed mobility comments: max assist to pivot toward left side of bed with sequential cues, assist to elevate trunk and move legs fully to the side with pt assisting with RUE on rail to sit  Transfers Overall transfer level: Needs  assistance Equipment used: 2 person hand held assist Transfers: Sit to/from Stand;Stand Pivot Transfers Sit to Stand: Max assist;+2 physical assistance Stand pivot transfers: Max assist;+2 physical assistance       General transfer comment: max +2 physical assist with max cues and bil feet blocked with belt and pad to partially stand initially with pt maintaining posterior lean and sat back down. Max cues for anterior translation for full stand and pivot toward left to chair. Maximove lift pad placed for return to bed    Balance Overall balance assessment: Needs assistance   Sitting balance-Leahy Scale: Fair Sitting balance - Comments: pt able to sit EOB with minguard with good posture and midline positioning     Standing balance-Leahy Scale: Zero Standing balance comment: heavy max +2 assist in standing                           ADL either performed or assessed with clinical judgement   ADL Overall ADL's : Needs assistance/impaired Eating/Feeding: Total assistance;Sitting Eating/Feeding Details (indicate cue type and reason): for small sips of drink Grooming: Minimal assistance;Sitting;Wash/dry face Grooming Details (indicate cue type and reason): for simple ADL in supported sitting  Upper Body Bathing: Maximal assistance;Sitting   Lower Body Bathing: Total assistance   Upper Body Dressing : Maximal assistance;Sitting   Lower Body Dressing: Total assistance;Sitting/lateral leans;Bed level   Toilet Transfer: Maximal assistance;+2 for physical assistance;+2 for safety/equipment;Stand-pivot Toilet Transfer Details (indicate cue type and reason): simulated via transfer to [place] and Hygiene: Total assistance;Sitting/lateral lean;Bed level       Functional mobility  during ADLs: Maximal assistance;+2 for physical assistance;+2 for safety/equipment (stand pivot)       Vision         Perception Perception Perception Tested?:  Yes Perception Deficits: Inattention/neglect Inattention/Neglect: Does not attend to right visual field;Does not attend to right side of body;Impaired- to be further tested in functional context Comments: max multimodal cues for attending to RUE    Praxis      Pertinent Vitals/Pain Pain Assessment: No/denies pain     Hand Dominance Right   Extremity/Trunk Assessment Upper Extremity Assessment Upper Extremity Assessment: RUE deficits/detail;Generalized weakness RUE Deficits / Details: grossly 1/5 throughout with no active movement noted  RUE Sensation: decreased light touch RUE Coordination: decreased fine motor;decreased gross motor   Lower Extremity Assessment Lower Extremity Assessment: Defer to PT evaluation RLE Deficits / Details: grossly 1/5 with minor knee flexion noted in supine with cues for flexion, trace hip flexion   Cervical / Trunk Assessment Cervical / Trunk Assessment: Normal   Communication Communication Communication: Expressive difficulties   Cognition Arousal/Alertness: Awake/alert Behavior During Therapy: WFL for tasks assessed/performed Overall Cognitive Status: Impaired/Different from baseline Area of Impairment: Memory;Safety/judgement;Following commands                     Memory: Decreased short-term memory Following Commands: Follows one step commands inconsistently Safety/Judgement: Decreased awareness of safety;Decreased awareness of deficits     General Comments: pt with decreased awareness of how he transfers, what assist he requires, decreased ability to follow commands for anterior lean and assist with standing   General Comments  VSS    Exercises     Shoulder Instructions      Home Living Family/patient expects to be discharged to:: Private residence Living Arrangements: Spouse/significant other Available Help at Discharge: Family Type of Home: House Home Access: Ramped entrance     Home Layout: Two level Alternate  Level Stairs-Number of Steps: flight to bedroom with stair lift Alternate Level Stairs-Rails:  (has stair lift )           Home Equipment: Wheelchair - power;Hospital bed;Other (comment) (hoyer)   Additional Comments: reports hoyer currently doesn't have working batteries      Prior Functioning/Environment Level of Independence: Needs assistance  Gait / Transfers Assistance Needed: physical assist for transfers to power w/c, spends 4-5hrs per day in the w/c, aide or daughter assists with transfers (hoyer currently not working), transfers to power w/c > stair lift > w/c  ADL's / Homemaking Assistance Needed: assist for ADL, sponge bathes    Comments: currently going to outpt 2x/wk         OT Problem List: Decreased strength;Decreased range of motion;Decreased activity tolerance;Impaired balance (sitting and/or standing);Impaired vision/perception;Decreased cognition;Decreased safety awareness;Decreased knowledge of precautions;Cardiopulmonary status limiting activity;Impaired UE functional use      OT Treatment/Interventions: Self-care/ADL training;Therapeutic exercise;Energy conservation;DME and/or AE instruction;Therapeutic activities;Cognitive remediation/compensation;Patient/family education;Balance training    OT Goals(Current goals can be found in the care plan section) Acute Rehab OT Goals Patient Stated Goal: return home OT Goal Formulation: With patient/family Time For Goal Achievement: 11/18/19 Potential to Achieve Goals: Good  OT Frequency: Min 2X/week   Barriers to D/C:            Co-evaluation PT/OT/SLP Co-Evaluation/Treatment: Yes Reason for Co-Treatment: For patient/therapist safety;To address functional/ADL transfers;Complexity of the patient's impairments (multi-system involvement)   OT goals addressed during session: ADL's and self-care      AM-PAC OT "6 Clicks" Daily Activity     Outcome  Measure Help from another person eating meals?: Total Help from  another person taking care of personal grooming?: A Lot Help from another person toileting, which includes using toliet, bedpan, or urinal?: Total Help from another person bathing (including washing, rinsing, drying)?: A Lot Help from another person to put on and taking off regular upper body clothing?: A Lot Help from another person to put on and taking off regular lower body clothing?: Total 6 Click Score: 9   End of Session Equipment Utilized During Treatment: Gait belt Nurse Communication: Mobility status;Need for lift equipment  Activity Tolerance: Patient tolerated treatment well Patient left: in chair;with call bell/phone within reach;with chair alarm set;with family/visitor present  OT Visit Diagnosis: Muscle weakness (generalized) (M62.81);Other abnormalities of gait and mobility (R26.89);Hemiplegia and hemiparesis;Other symptoms and signs involving cognitive function Hemiplegia - Right/Left: Right Hemiplegia - dominant/non-dominant: Dominant Hemiplegia - caused by: Cerebral infarction (previous CVA)                Time: 9983-3825 OT Time Calculation (min): 29 min Charges:  OT General Charges $OT Visit: 1 Visit OT Evaluation $OT Eval Moderate Complexity: [place], OT Acute Rehabilitation Services Pager [phone] Office [phone]   Raymondo Band 11/04/2019, 2:41 PM

## 2019-11-05 DIAGNOSIS — I482 Chronic atrial fibrillation, unspecified: Secondary | ICD-10-CM

## 2019-11-05 DIAGNOSIS — L8990 Pressure ulcer of unspecified site, unspecified stage: Secondary | ICD-10-CM | POA: Insufficient documentation

## 2019-11-05 DIAGNOSIS — I429 Cardiomyopathy, unspecified: Secondary | ICD-10-CM

## 2019-11-05 DIAGNOSIS — Z8673 Personal history of transient ischemic attack (TIA), and cerebral infarction without residual deficits: Secondary | ICD-10-CM

## 2019-11-05 DIAGNOSIS — N184 Chronic kidney disease, stage 4 (severe): Secondary | ICD-10-CM

## 2019-11-05 DIAGNOSIS — I2510 Atherosclerotic heart disease of native coronary artery without angina pectoris: Secondary | ICD-10-CM

## 2019-11-05 LAB — CBC WITH DIFFERENTIAL/PLATELET
Abs Immature Granulocytes: 0.02 10*3/uL (ref 0.00–0.07)
Basophils Absolute: 0 10*3/uL (ref 0.0–0.1)
Basophils Relative: 0 %
Eosinophils Absolute: 0.2 10*3/uL (ref 0.0–0.5)
Eosinophils Relative: 4 %
HCT: 32.5 % — ABNORMAL LOW (ref 39.0–52.0)
Hemoglobin: 9.8 g/dL — ABNORMAL LOW (ref 13.0–17.0)
Immature Granulocytes: 0 %
Lymphocytes Relative: 27 %
Lymphs Abs: 1.3 10*3/uL (ref 0.7–4.0)
MCH: 29.7 pg (ref 26.0–34.0)
MCHC: 30.2 g/dL (ref 30.0–36.0)
MCV: 98.5 fL (ref 80.0–100.0)
Monocytes Absolute: 0.4 10*3/uL (ref 0.1–1.0)
Monocytes Relative: 8 %
Neutro Abs: 2.8 10*3/uL (ref 1.7–7.7)
Neutrophils Relative %: 61 %
Platelets: 89 10*3/uL — ABNORMAL LOW (ref 150–400)
RBC: 3.3 MIL/uL — ABNORMAL LOW (ref 4.22–5.81)
RDW: 15.3 % (ref 11.5–15.5)
WBC: 4.6 10*3/uL (ref 4.0–10.5)
nRBC: 0 % (ref 0.0–0.2)

## 2019-11-05 LAB — COMPREHENSIVE METABOLIC PANEL
ALT: 18 U/L (ref 0–44)
AST: 20 U/L (ref 15–41)
Albumin: 2.3 g/dL — ABNORMAL LOW (ref 3.5–5.0)
Alkaline Phosphatase: 68 U/L (ref 38–126)
Anion gap: 9 (ref 5–15)
BUN: 40 mg/dL — ABNORMAL HIGH (ref 8–23)
CO2: 24 mmol/L (ref 22–32)
Calcium: 8.4 mg/dL — ABNORMAL LOW (ref 8.9–10.3)
Chloride: 111 mmol/L (ref 98–111)
Creatinine, Ser: 1.92 mg/dL — ABNORMAL HIGH (ref 0.61–1.24)
GFR calc Af Amer: 34 mL/min — ABNORMAL LOW (ref >60)
GFR calc non Af Amer: 29 mL/min — ABNORMAL LOW (ref >60)
Glucose, Bld: 119 mg/dL — ABNORMAL HIGH (ref 70–99)
Potassium: 3.6 mmol/L (ref 3.5–5.1)
Sodium: 144 mmol/L (ref 135–145)
Total Bilirubin: 0.7 mg/dL (ref 0.3–1.2)
Total Protein: 5.6 g/dL — ABNORMAL LOW (ref 6.5–8.1)

## 2019-11-05 LAB — LIPID PANEL
Cholesterol: 98 mg/dL (ref 0–200)
HDL: 28 mg/dL — ABNORMAL LOW (ref >40)
LDL Cholesterol: 54 mg/dL (ref 0–99)
Total CHOL/HDL Ratio: 3.5 RATIO
Triglycerides: 79 mg/dL (ref <150)
VLDL: 16 mg/dL (ref 0–40)

## 2019-11-05 LAB — URINE CULTURE: Culture: >=100000 — AB

## 2019-11-05 LAB — MAGNESIUM: Magnesium: 2 mg/dL (ref 1.7–2.4)

## 2019-11-05 LAB — PROCALCITONIN: Procalcitonin: 15.25 ng/mL

## 2019-11-05 LAB — APTT: aPTT: 44 seconds — ABNORMAL HIGH (ref 24–36)

## 2019-11-05 LAB — PHOSPHORUS: Phosphorus: 4 mg/dL (ref 2.5–4.6)

## 2019-11-05 LAB — HEPARIN LEVEL (UNFRACTIONATED): Heparin Unfractionated: 0.25 IU/mL — ABNORMAL LOW (ref 0.30–0.70)

## 2019-11-05 MED ORDER — APIXABAN 2.5 MG PO TABS
2.5000 mg | ORAL_TABLET | Freq: Two times a day (BID) | ORAL | Status: DC
  Administered 2019-11-05 – 2019-11-07 (×5): 2.5 mg via ORAL
  Filled 2019-11-05 (×5): qty 1

## 2019-11-05 NOTE — Progress Notes (Signed)
Pt with ample bleeding from IV sites at 0215 this am. RN stopped heparin gtt and held manual pressure at sites until bleeding stopped. MD on call was paged to advise on restarting heparin. No return call or orders from MD. Heparin remains off until further order or care instructions. Pharmacy also made aware of stopping heparin gtt. Pt VS stable and resting comfortably with no complaints. Will continue to monitor.

## 2019-11-05 NOTE — Progress Notes (Signed)
PHARMACY - PHYSICIAN COMMUNICATION CRITICAL VALUE ALERT - BLOOD CULTURE IDENTIFICATION (BCID)  Jeffory Snelgrove is an 86 y.o. male who presented to [facility] on 11/02/2019 with a chief complaint of fever and dysuria  Assessment:  1/4 BC gnr, no BCID  Name of physician (or Provider) Contacted: none  Current antibiotics: azithromycin and rocephin  Changes to prescribed antibiotics recommended:  none  No results found for this or any previous visit.  Excell Seltzer Poteet 11/05/2019  6:54 AM

## 2019-11-05 NOTE — Progress Notes (Addendum)
PROGRESS NOTE    Robert Mcconnell  JOI:[id number] DOB: 04-15-1922 DOA: 11/02/2019 PCP: [name], MD   Brief Narrative: 86 year old with past medical history significant for CAD status post stent, CVA February with residual right hemiparesis, stage IV CKD who presented with fever.  Patient reporter small amount of blood coming out from his penis after urination 3 days ago.  Patient also developed chills, high fever and vomiting.  He was not able to take Tylenol.  Temperature as high as 102.  He does have CKD and he will not pursued hemodialysis.  He has a past medical history of A. fib as well.  Recently moved from [place] to live with his daughter.  He reporter  Dysuria.  He was hypotensive on admission, UA consistent with UTI and chest x-ray with possible pneumonia.     Assessment & Plan:   Principal Problem:   Sepsis secondary to UTI [facility]) Active Problems:   History of ischemic stroke   CAD (coronary artery disease)   CKD (chronic kidney disease) stage 4, GFR 15-29 ml/min (HCC)   Dysphagia as late effect of cerebrovascular accident (CVA)   Acute renal failure superimposed on stage 4 chronic kidney disease (HCC)   Right hemiparesis (HCC)   Atrial fibrillation, chronic (HCC)   Severe sepsis (HCC)   CAP (community acquired pneumonia)   Dysphagia   Pressure injury of skin   1-Severe sepsis, secondary to UTI Klebsiella pneumoniae lactobacillus growing in urine versus CAP; -Patient presented with fever temperature more than 102, lactic acidosis, hypotension.  Source of infection UA versus pneumonia -He received IV fluids. -Ultrasound was negative for nephrolithiasis -Continue with ceftriaxone and azithromycin. -Plan t to discontinue IV fluids due to new cardiomyopathy with ejection fraction 30%   2-Dysphagia: Patient with dysphagia since his CVA. Continue with dysphagia 2 diet Aspiration precaution  3-History of CVA: Marked deficit with right hemiparesis, right  facial droop, dysphagia and dysarthria Continue with Remeron Resume Eliquis  New cardiomyopathy, Ef 35 %; Wife was not aware of prior history of heart failure Cardiology consulted  Acute on chronic stage IV CKD: Creatinine baseline 2.3 Received IV fluids. Hold IV fluids due to heart failure, cardiomyopathy Creatinine trending down today of 1.9  CAD/A. fib chronic Continue with Eliquis Continue Lipitor  BPH: Continue with finasteride and Terazosin  Pressure injury, sacrum; stage 2, POA; local care  Pressure Injury 11/03/19 Sacrum Posterior Stage 2 -  Partial thickness loss of dermis presenting as a shallow open injury with a red, pink wound bed without slough. (Active)  11/03/19 1100  Location: Sacrum  Location Orientation: Posterior  Staging: Stage 2 -  Partial thickness loss of dermis presenting as a shallow open injury with a red, pink wound bed without slough.  Wound Description (Comments):   Present on Admission: Yes                  Estimated body mass index is 24.28 kg/m as calculated from the following:   Height as of this encounter: 5\' 9"  (1.753 m).   Weight as of this encounter: 74.6 kg.   DVT prophylaxis: Eliquis Code Status: DNR Family Communication: Wife at bedside Disposition Plan:  Status is: Inpatient  Remains inpatient appropriate because:Hemodynamically unstable   Dispo: The patient is from: Home              Anticipated d/c is to: Home              Anticipated d/c date is: 2 days  Patient currently is not medically stable to d/c.  Continue with IV antibiotics, further evaluation for new cardiomyopathy by cardiology        Consultants:  Cardiology  Procedures:   None  Antimicrobials:  Ceftriaxone and azithromycin  Subjective: Patient is alert, dysarthric speech.  Difficult to understand at times.  He denies worsening shortness of breath.  He is anxious and cried at time  Objective: Vitals:   11/04/19 2300  11/04/19 2326 11/05/19 0300 11/05/19 0818  BP:  138/88 (!) 159/92 (!) 144/89  Pulse: (!) 43 63 85 70  Resp: 14 18 17 16   Temp:  97.6 F (36.4 C) 97.7 F (36.5 C) (!) 97.5 F (36.4 C)  TempSrc:  Oral Oral Oral  SpO2: 99% 98% 96% 98%  Weight:      Height:        Intake/Output Summary (Last 24 hours) at 11/05/2019 1201 Last data filed at 11/05/2019 0829 Gross per 24 hour  Intake 1363.95 ml  Output 1000 ml  Net 363.95 ml   Filed Weights   11/02/19 2315 11/03/19 0053  Weight: 71.8 kg 74.6 kg    Examination:  General exam: Appears calm and comfortable  Respiratory system: Clear to auscultation. Respiratory effort normal. Cardiovascular system: S1 & S2 heard, RRR. No JVD, murmurs, rubs, gallops or clicks. No pedal edema. Gastrointestinal system: Abdomen is nondistended, soft and nontender. No organomegaly or masses felt. Normal bowel sounds heard. Central nervous system: Alert , dysarthric speech Extremities: Trace edema  Data Reviewed: I have personally reviewed following labs and imaging studies  CBC: Recent Labs  Lab 11/02/19 2315 11/03/19 0643 11/04/19 0240 11/05/19 0303  WBC 9.1 10.5 5.9 4.6  NEUTROABS 8.2* 9.0*  --  2.8  HGB 11.5* 9.8* 9.8* 9.8*  HCT 37.9* 32.2* 32.4* 32.5*  MCV 99.5 99.7 99.1 98.5  PLT 99* 90* 84* 89*   Basic Metabolic Panel: Recent Labs  Lab 11/02/19 2315 11/03/19 0643 11/04/19 0240 11/05/19 0303  NA 143 144 143 144  K 3.9 4.0 3.7 3.6  CL 109 112* 111 111  CO2 22 24 22 24   GLUCOSE 179* 159* 106* 119*  BUN 52* 49* 49* 40*  CREATININE 2.32* 2.08* 2.09* 1.92*  CALCIUM 8.0* 8.1* 8.0* 8.4*  MG  --   --   --  2.0  PHOS  --   --   --  4.0   GFR: Estimated Creatinine Clearance: 23 mL/min (A) (by C-G formula based on SCr of 1.92 mg/dL (H)). Liver Function Tests: Recent Labs  Lab 11/02/19 2315 11/03/19 0643 11/05/19 0303  AST 25 25 20   ALT 18 19 18   ALKPHOS 88 76 68  BILITOT 1.0 0.8 0.7  PROT 6.4* 5.6* 5.6*  ALBUMIN 2.7* 2.2*  2.3*   No results for input(s): LIPASE, AMYLASE in the last 168 hours. No results for input(s): AMMONIA in the last 168 hours. Coagulation Profile: Recent Labs  Lab 11/02/19 2315  INR 1.3*   Cardiac Enzymes: No results for input(s): CKTOTAL, CKMB, CKMBINDEX, TROPONINI in the last 168 hours. BNP (last 3 results) No results for input(s): PROBNP in the last 8760 hours. HbA1C: No results for input(s): HGBA1C in the last 72 hours. CBG: No results for input(s): GLUCAP in the last 168 hours. Lipid Profile: Recent Labs    11/05/19 0303  CHOL 98  HDL 28*  LDLCALC 54  TRIG 79  CHOLHDL 3.5   Thyroid Function Tests: Recent Labs    11/03/19 0643  TSH 3.379  Anemia Panel: No results for input(s): VITAMINB12, FOLATE, FERRITIN, TIBC, IRON, RETICCTPCT in the last 72 hours. Sepsis Labs: Recent Labs  Lab 11/03/19 1610 11/03/19 0839 11/03/19 0902 11/04/19 1035 11/04/19 1250 11/05/19 0303  PROCALCITON 18.11  --  1.02 20.75  --  15.25  LATICACIDVEN 1.7 1.2  --  1.4 1.2  --     Recent Results (from the past 240 hour(s))  Blood Culture (routine x 2)     Status: None (Preliminary result)   Collection Time: 11/02/19 11:15 PM   Specimen: BLOOD LEFT ARM  Result Value Ref Range Status   Specimen Description   Final    BLOOD LEFT ARM Performed at [facility], [address] [place], [place] [address]    Special Requests   Final    BOTTLES DRAWN AEROBIC AND ANAEROBIC Blood Culture adequate volume Performed at [facility], [address] [place], [place] [address]    Culture   Final    NO GROWTH 2 DAYS Performed at [facility], [place] [address] [place], [place] [address]    Report Status PENDING  Incomplete  Blood Culture (routine x 2)     Status: None (Preliminary result)   Collection Time: 11/02/19 11:20 PM   Specimen: BLOOD RIGHT FOREARM  Result Value Ref Range Status   Specimen Description   Final    BLOOD RIGHT FOREARM Performed at [facility], [address] [place], [place] [address]    Special Requests   Final    BOTTLES DRAWN AEROBIC AND ANAEROBIC Blood Culture adequate volume Performed at [facility], [address] [place], [place] [address]    Culture  Setup Time   Final    GRAM POSITIVE RODS ANAEROBIC BOTTLE ONLY CRITICAL RESULT CALLED TO, READ BACK BY AND VERIFIED WITH: Shellee Milo PHARMD, AT 6578 11/05/19 BY Rush Landmark Performed at [facility], [place] [address], [place], [place] [address]    Culture GRAM POSITIVE RODS  Final   Report Status PENDING  Incomplete  Urine culture     Status: Abnormal   Collection Time: 11/03/19 12:41 AM   Specimen: In/Out Cath Urine  Result Value Ref Range Status   Specimen Description   Final    IN/OUT CATH URINE Performed at [facility], [address] [place], [place] [address]    Special Requests   Final    NONE Performed at [facility], [address] [place], [place] [address]    Culture (A)  Final    >=100,000 COLONIES/mL KLEBSIELLA PNEUMONIAE >=100,000 COLONIES/mL LACTOBACILLUS SPECIES Standardized susceptibility testing for this organism is not available. Performed at [facility], [place] [address], [place], [place] [address]    Report Status 11/05/2019 FINAL  Final   Organism ID, Bacteria KLEBSIELLA PNEUMONIAE (A)  Final      Susceptibility   Klebsiella pneumoniae - MIC*    AMPICILLIN RESISTANT Resistant     CEFAZOLIN <=4 SENSITIVE Sensitive     CEFTRIAXONE <=0.25 SENSITIVE Sensitive     CIPROFLOXACIN <=0.25 SENSITIVE Sensitive     GENTAMICIN <=1 SENSITIVE Sensitive     IMIPENEM <=0.25 SENSITIVE Sensitive     NITROFURANTOIN 32 SENSITIVE Sensitive     TRIMETH/SULFA <=20 SENSITIVE Sensitive     AMPICILLIN/SULBACTAM <=2 SENSITIVE Sensitive     PIP/TAZO <=4 SENSITIVE Sensitive     * >=100,000 COLONIES/mL KLEBSIELLA PNEUMONIAE  SARS Coronavirus 2 by RT PCR (hospital order, performed in [facility]  hospital lab) Nasopharyngeal Nasopharyngeal Swab     Status: None   Collection Time: 11/03/19 12:41 AM   Specimen: Nasopharyngeal Swab  Result Value Ref Range Status   SARS Coronavirus 2 NEGATIVE NEGATIVE Final    Comment: (NOTE) SARS-CoV-2 target nucleic acids are NOT DETECTED.  The SARS-CoV-2 RNA is generally detectable in upper and lower respiratory specimens during the acute phase of infection. The lowest concentration of SARS-CoV-2 viral copies this assay can detect is 250 copies / mL. A negative result does not preclude SARS-CoV-2 infection and should not be used as the sole basis for treatment or other patient management decisions.  A negative result may occur with improper specimen collection / handling, submission of specimen other than nasopharyngeal swab, presence of viral mutation(s) within the areas targeted by this assay, and inadequate number of viral copies (<250 copies / mL). A negative result must be combined with clinical observations, patient history, and epidemiological information.  Fact Sheet for Patients:   StrictlyIdeas.no  Fact Sheet for Healthcare Providers: BankingDealers.co.za  This test is not yet approved or  cleared by the [place] FDA and has been authorized for detection and/or diagnosis of SARS-CoV-2 by FDA under an Emergency Use Authorization (EUA).  This EUA will remain in effect (meaning this test can be used) for the duration of the COVID-19 declaration under Section 564(b)(1) of the Act, 21 U.S.C. section 360bbb-3(b)(1), unless the authorization is terminated or revoked sooner.  Performed at [facility], [address] [place], [place] [address]   MRSA PCR Screening     Status: None   Collection Time: 11/03/19  5:25 AM   Specimen: Nasopharyngeal  Result Value Ref Range Status   MRSA by PCR NEGATIVE NEGATIVE Final    Comment:        The GeneXpert MRSA Assay (FDA approved  for NASAL specimens only), is one component of a comprehensive MRSA colonization surveillance program. It is not intended to diagnose MRSA infection nor to guide or monitor treatment for MRSA infections. Performed at [facility], [place] [address] [place],  [address]          Radiology Studies: ECHOCARDIOGRAM COMPLETE  Result Date: 11/04/2019    ECHOCARDIOGRAM REPORT   Patient Name:   Robert Mcconnell Date of Exam: 11/04/2019 Medical Rec #:  [id number]      Height:       69.0 in Accession #:    [id number]     Weight:       164.4 lb Date of Birth:  08-29-1921      BSA:          1.901 m Patient Age:    89 years       BP:           131/90 mmHg Patient Gender: M              HR:           68 bpm. Exam Location:  Inpatient Procedure: 2D Echo Indications:    stroke 434.91  History:        Patient has no prior history of Echocardiogram examinations.                 CAD; chronic kidney disease.  Sonographer:    Johny Chess Referring Phys: [id number] [place]  1. Left ventricular ejection fraction, by estimation, is 30 to 35%.  The left ventricle has moderate to severely decreased function. The left ventricle demonstrates regional wall motion abnormalities (see scoring diagram/findings for description). The left ventricular internal cavity size was mildly dilated. Left ventricular diastolic function could not be evaluated. There is severe hypokinesis of the left ventricular, entire inferior wall, inferolateral wall and inferoseptal wall.  2. Right ventricular systolic function is mildly reduced. The right ventricular size is normal. There is moderately elevated pulmonary artery systolic pressure.  3. Left atrial size was severely dilated.  4. Right atrial size was severely dilated.  5. The mitral valve is normal in structure. Mild to moderate mitral valve regurgitation.  6. Tricuspid valve regurgitation is mild to moderate.  7. The aortic valve is tricuspid. Aortic valve  regurgitation is trivial. Mild to moderate aortic valve sclerosis/calcification is present, without any evidence of aortic stenosis.  8. Aortic dilatation noted. There is mild dilatation of the ascending aorta measuring 44 mm.  9. The inferior vena cava is dilated in size with <50% respiratory variability, suggesting right atrial pressure of 15 mmHg. FINDINGS  Left Ventricle: Left ventricular ejection fraction, by estimation, is 30 to 35%. The left ventricle has moderate to severely decreased function. The left ventricle demonstrates regional wall motion abnormalities. Severe hypokinesis of the left ventricular, entire inferior wall, inferolateral wall and inferoseptal wall. The left ventricular internal cavity size was mildly dilated. There is no left ventricular hypertrophy. Left ventricular diastolic function could not be evaluated due to atrial fibrillation. Left ventricular diastolic function could not be evaluated. Right Ventricle: The right ventricular size is normal. No increase in right ventricular wall thickness. Right ventricular systolic function is mildly reduced. There is moderately elevated pulmonary artery systolic pressure. The tricuspid regurgitant velocity is 2.97 m/s, and with an assumed right atrial pressure of 15 mmHg, the estimated right ventricular systolic pressure is 59.5 mmHg. Left Atrium: Left atrial size was severely dilated. Right Atrium: Right atrial size was severely dilated. Pericardium: There is no evidence of pericardial effusion. Mitral Valve: The mitral valve is normal in structure. Mild mitral annular calcification. Mild to moderate mitral valve regurgitation, with centrally-directed jet. Tricuspid Valve: The tricuspid valve is normal in structure. Tricuspid valve regurgitation is mild to moderate. Aortic Valve: The aortic valve is tricuspid. . There is moderate thickening and mild calcification of the aortic valve. Aortic valve regurgitation is trivial. Mild to moderate aortic  valve sclerosis/calcification is present, without any evidence of aortic stenosis. There is moderate thickening of the aortic valve. There is mild calcification of the aortic valve. Pulmonic Valve: The pulmonic valve was grossly normal. Pulmonic valve regurgitation is trivial. Aorta: Aortic dilatation noted. There is mild dilatation of the ascending aorta measuring 44 mm. Venous: The inferior vena cava is dilated in size with less than 50% respiratory variability, suggesting right atrial pressure of 15 mmHg. IAS/Shunts: No atrial level shunt detected by color flow Doppler.  LEFT VENTRICLE PLAX 2D LVIDd:         6.30 cm LVIDs:         5.50 cm LV PW:         1.10 cm LV IVS:        1.00 cm LVOT diam:     2.30 cm LVOT Area:     4.15 cm  IVC IVC diam: 2.50 cm LEFT ATRIUM             Index       RIGHT ATRIUM  Index LA diam:        4.40 cm 2.31 cm/m  RA Area:     17.10 cm LA Vol (A2C):   87.2 ml 45.87 ml/m RA Volume:   42.80 ml  22.52 ml/m LA Vol (A4C):   68.2 ml 35.88 ml/m LA Biplane Vol: 76.4 ml 40.19 ml/m   AORTA Ao Root diam: 4.30 cm Ao Asc diam:  4.40 cm TRICUSPID VALVE TR Peak grad:   35.3 mmHg TR Vmax:        297.00 cm/s  SHUNTS Systemic Diam: 2.30 cm Mihai Croitoru MD Electronically signed by Sanda Klein MD Signature Date/Time: 11/04/2019/2:36:02 PM    Final         Scheduled Meds: . atorvastatin  40 mg Oral BID  . calcitRIOL  2.25 mcg Oral Q M,W,F  . docusate sodium  100 mg Oral BID  . famotidine  20 mg Oral Daily  . finasteride  5 mg Oral Daily  . melatonin  9 mg Oral QHS  . mirtazapine  15 mg Oral QHS  . senna  1 tablet Oral Daily  . sodium chloride flush  3 mL Intravenous Q12H  . terazosin  5 mg Oral QHS   Continuous Infusions: . sodium chloride 10 mL/hr at 11/03/19 0128  . azithromycin (ZITHROMAX) 500 MG IVPB (Vial-Mate [employer]) 500 mg (11/05/19 0051)  . cefTRIAXone (ROCEPHIN)  IV 200 mL/hr at 11/05/19 0000     LOS: 2 days    Time spent: 35 minutes    Nycere Presley A  Iram Lundberg, MD [facility]   If 7PM-7AM, please contact night-coverage [email or web address]  11/05/2019, 12:01 PM

## 2019-11-05 NOTE — Plan of Care (Signed)
  Problem: Clinical Measurements: Goal: Respiratory complications will improve Outcome: Progressing Goal: Cardiovascular complication will be avoided Outcome: Progressing   Problem: Activity: Goal: Risk for activity intolerance will decrease Outcome: Progressing   Problem: Nutrition: Goal: Adequate nutrition will be maintained Outcome: Progressing   Problem: Coping: Goal: Level of anxiety will decrease Outcome: Progressing   Problem: Elimination: Goal: Will not experience complications related to bowel motility Outcome: Progressing Goal: Will not experience complications related to urinary retention Outcome: Progressing   Problem: Pain Managment: Goal: General experience of comfort will improve Outcome: Progressing   Problem: Safety: Goal: Ability to remain free from injury will improve Outcome: Progressing   Problem: Skin Integrity: Goal: Risk for impaired skin integrity will decrease Outcome: Progressing

## 2019-11-05 NOTE — Progress Notes (Signed)
ANTICOAGULATION CONSULT NOTE - Follow Up Consult  Pharmacy Consult for heparin > apixaban Indication: atrial fibrillation  Labs: Recent Labs    11/02/19 2315 11/02/19 2315 11/03/19 3557 11/03/19 3220 11/04/19 0012 11/04/19 0012 11/04/19 0240 11/04/19 0936 11/04/19 0936 11/04/19 1333 11/04/19 2234 11/05/19 0303  HGB 11.5*   < > 9.8*   < >  --   --  9.8*  --   --   --   --  9.8*  HCT 37.9*   < > 32.2*  --   --   --  32.4*  --   --   --   --  32.5*  PLT 99*   < > 90*  --   --   --  84*  --   --   --   --  89*  APTT 31   < >  --   --  183*   < >  --  164*   < > 104* 90* 44*  LABPROT 15.5*  --   --   --   --   --   --   --   --   --   --   --   INR 1.3*  --   --   --   --   --   --   --   --   --   --   --   HEPARINUNFRC  --   --   --   --  1.12*  --   --  0.89*  --   --   --  0.25*  CREATININE 2.32*   < > 2.08*  --   --   --  2.09*  --   --   --   --  1.92*  TROPONINIHS  --   --  64*  --   --   --   --   --   --   --   --   --    < > = values in this interval not displayed.    Assessment:  86yo male on apixaban PTA started on heparin drip while inpatient and apixaban initially held.   Patient had oozing from IV sites last pm and heparin drip stopped.   Discussed with MD today - bleeding subsided, taking other oral meds, no planned procedures  Will stop heparin and restart apixaban  Age >80y, Cr > 1.5, wt >60Kg  Plan: APixaban 2.5mg  BID  Bonnita Nasuti Pharm.D. CPP, BCPS Clinical Pharmacist [phone] 11/05/2019 1:23 PM

## 2019-11-05 NOTE — TOC Initial Note (Signed)
Transition of Care [facility]) - Initial/Assessment Note    Patient Details  Name: Robert Mcconnell MRN: [id number] Date of Birth: 07-17-1921  [facility] CM/SW Contact:    Zenon Mayo, RN Phone Number: 11/05/2019, 2:16 PM  Clinical Narrative:                 Patient is from home with his spouse and daughter, he has a [employer] fast streamer, hospital bed, electric w/chair at home.  Wife states they do not want SNF.   Wife states they will transport him by the special [facility] they have at discharge, but she will check with her daughter to make sure.  He has 24 hr care around the clock.  He has aide with [facility] for 8hr/day Mon thru Friday and then family support for the rest of the time.  She states they do not need any HH services at this time since he has an aide.  TOC team will continue to follow for dc needs. Plan to dc in couple days.  Expected Discharge Plan: [place] Barriers to Discharge: Continued Medical Work up   Patient Goals and CMS Choice Patient states their goals for this hospitalization and ongoing recovery are:: to get better   Choice offered to / list presented to : NA  Expected Discharge Plan and Services Expected Discharge Plan: [place] In-house Referral: NA Discharge Planning Services: CM Consult Post Acute Care Choice: [place] arrangements for the past 2 months: Single Family Home                   DME Agency: NA       HH Arranged: NA          Prior Living Arrangements/Services Living arrangements for the past 2 months: Single Family Home Lives with:: Spouse, Adult Children Patient language and need for interpreter reviewed:: Yes Do you feel safe going back to the place where you live?: Yes      Need for Family Participation in Patient Care: Yes (Comment) Care giver support system in place?: Yes (comment) Current home services: Homehealth aide, DME (aide is there 8 hr/day Mon thru Friday, then  family support for the rest of the time, DME he has hoyer lift, hosp bed, w/chair,) Criminal Activity/Legal Involvement Pertinent to Current Situation/Hospitalization: No - Comment as needed  Activities of Daily Living   ADL Screening (condition at time of admission) Patient's cognitive ability adequate to safely complete daily activities?: No Is the patient deaf or have difficulty hearing?: Yes Does the patient have difficulty seeing, even when wearing glasses/contacts?: Yes Does the patient have difficulty concentrating, remembering, or making decisions?: Yes Patient able to express need for assistance with ADLs?: Yes Does the patient have difficulty dressing or bathing?: Yes Independently performs ADLs?: No Communication: Independent Dressing (OT): Needs assistance Is this a change from baseline?: Pre-admission baseline Grooming: Needs assistance Is this a change from baseline?: Pre-admission baseline Feeding: Needs assistance Is this a change from baseline?: Change from baseline, expected to last <3 days Bathing: Needs assistance Is this a change from baseline?: Pre-admission baseline Toileting: Needs assistance Is this a change from baseline?: Pre-admission baseline In/Out Bed: Needs assistance Is this a change from baseline?: Change from baseline, expected to last <3 days Walks in Home: Needs assistance Is this a change from baseline?: Pre-admission baseline Does the patient have difficulty walking or climbing stairs?: Yes Weakness of Legs: Both Weakness of Arms/Hands: Both  Permission Sought/Granted                  Emotional Assessment Appearance:: Appears stated age Attitude/Demeanor/Rapport: Unable to Assess Affect (typically observed): Unable to Assess   Alcohol / Substance Use: Not Applicable Psych Involvement: No (comment)  Admission diagnosis:  Acute cystitis without hematuria [N30.00] Sepsis ([place]) [A41.9] Severe sepsis ([place]) [A41.9, R65.20] Patient  Active Problem List   Diagnosis Date Noted  . Pressure injury of skin 11/05/2019  . Acute renal failure superimposed on stage 4 chronic kidney disease ([place]) 11/04/2019  . Right hemiparesis ([place]) 11/04/2019  . Atrial fibrillation, chronic ([place]) 11/04/2019  . Severe sepsis ([place]) 11/04/2019  . CAP (community acquired pneumonia) 11/04/2019  . Dysphagia 11/04/2019  . Sepsis secondary to UTI ([place]) 11/03/2019  . 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 CKD (chronic kidney disease) stage 4, GFR 15-29 ml/min (HCC) 09/29/2019  . Thrombocytopenia ([place]) 09/29/2019  . Personal history of DVT (deep vein thrombosis) 09/09/2019  . History of ischemic stroke 09/09/2019  . CAD (coronary artery disease) 09/09/2019  . Chronic constipation 09/09/2019  . History of colorectal cancer 09/09/2019  . 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 Right arm weakness 09/09/2019  . Right leg weakness 09/09/2019  . 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 [place] [address] Phone: [phone] Fax: [phone]     Social Determinants of Health (SDOH) Interventions    Readmission Risk Interventions No flowsheet data found.

## 2019-11-05 NOTE — Consult Note (Signed)
Cardiology Consultation:   Patient ID: Robert Mcconnell; [id number]; 1921-07-19   Admit date: 11/02/2019 Date of Consult: 11/05/2019  Primary Care Provider: [name], MD Primary Cardiologist: New to [place]; Dr. Harrell Gave Primary Electrophysiologist:  None    Patient Profile:   Robert Mcconnell is a 86 y.o. 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 Tyrell Antonio.  History of Present Illness:   Robert Mcconnell was in his usual state of health until a few days ago when he began experiencing hematuria. He subsequently developed dysuria, fever (max 102 at home), chills, and vomiting, prompting family to bring him to the ED for further evaluation.  He is originally from [place] and recently moved to [place] from [place] to live with his daughter/son-in-law. Reported history of CAD with remote PCI to unknown vessel 10-15 years ago. Daughter reports he followed with a cardiologist for a couple years afterwards, then was released to follow with PCP and has not seen a cardiologist since that time. He was quite functional up until his CVA 06/2019 - shoveling snow, driving, doing ADLs. Since that time he has been struggling with residual right sided hemiparesis limiting his activity. He continues to work with physical therapy. Presumably he had an echocardiogram done at the time of his CVA, though daughter does not recall if this was done and certainly did not get any information to suggest his EF was down at that time.    At the time of this evaluation he is resting comfortably in bed eating dinner with the help of his daughter. Wife is also at bedside. He has no recent complaints of chest pain, SOB, DOE, palpitations, or LE edema. Patient and family are not interested in invasive testing and  would not want to pursue dialysis in the event that his renal function declined. They are realistic about his age and comorbidities and want him to be comfortable. We talked about limitations for medical management at this time with his bradycardia, renal insufficiency, and intermittent hypotension. We discussed limiting salt intake, though priority should be getting enough calories. They report he likes salty foods and has a hearty appetite. He brings up financial concerns and wishes to drive several times.   Hospital course: afebrile this admission with intermittent hypotension initially, now with intermittent hypertension, with HR in the 40s-70s, otherwise VSS. Labs notable for K 3.9>3.5, Cr. 2.32>1.92, albumin 2.3, Hgb 11.5>9.8, PLT 99>89, TSH wnl, Lactate 2.8 and trended down with IVFs, Hs Trop 64, procalcitonin 18>20>15, LDL 54. UA was grossly positive with UCx growing klebsiella and lactobacillus. BCx with gram positive rods in 1 of 2 samples. He has been on broad spectrum antibiotics this admission. CXR with cardiomegaly with vascular congestion and bibasilar patchy opacities (R>L) c/f PNA vs edema vs atelectasis. CT Head with old left MCA infarct but no acute findings. Renal US showed medical renal disease. Echo showed EF 30-35% with severe hypokinesis of the entire inferior/inferolateral/inferoseptal wall, mild RV systolic dysfunction, severe biatrial enlargement mild-moderate MR, mild-moderate TR, and a mildly dilated ascending aortic aneurysm. Cardiology consulted for new systolic CHF.   Past Medical History:  Diagnosis Date   Atrial fibrillation (HCC)    BPH (benign prostatic hyperplasia)    CKD (chronic kidney disease)    History of coronary angioplasty  with insertion of stent    Hypertension    Severe sepsis ([place]) 11/04/2019   Stroke [facility])     Past Surgical History:  Procedure Laterality Date   COLECTOMY  2006   cancer     Home Medications:  Prior to Admission medications    Medication Sig Start Date End Date Taking? Authorizing Provider  apixaban (ELIQUIS) 2.5 MG TABS tablet Take 2.5 mg by mouth 2 (two) times daily.    Yes [provider]  atorvastatin (LIPITOR) 40 MG tablet Take 40 mg by mouth at bedtime.    Yes [provider]  calcitRIOL (ROCALTROL) 1 MCG/ML solution Take 0.25 mcg by mouth every Monday, Wednesday, and Friday.    Yes [provider]  Docusate Sodium 150 MG/15ML syrup Take 150 mg by mouth daily.    Yes [provider]  famotidine (PEPCID) 20 MG tablet Take 20 mg by mouth daily.    Yes [provider]  finasteride (PROSCAR) 5 MG tablet Take 5 mg by mouth daily.   Yes [provider]  melatonin 3 MG TABS tablet Take 9 mg by mouth at bedtime.   Yes [provider]  mirtazapine (REMERON) 15 MG tablet Take 15 mg by mouth at bedtime.   Yes [provider]  senna (SENOKOT) 8.6 MG TABS tablet Take 1 tablet by mouth daily.   Yes [provider]  terazosin (HYTRIN) 5 MG capsule Take 5 mg by mouth at bedtime.   Yes [provider]  Zinc Sulfate (ZINC 15 PO) Take 15 mg by mouth 2 (two) times daily.   Yes [provider]    Inpatient Medications: Scheduled Meds:  apixaban  2.5 mg Oral BID   atorvastatin  40 mg Oral BID   calcitRIOL  2.25 mcg Oral Q M,W,F   docusate sodium  100 mg Oral BID   famotidine  20 mg Oral Daily   finasteride  5 mg Oral Daily   melatonin  9 mg Oral QHS   mirtazapine  15 mg Oral QHS   senna  1 tablet Oral Daily   sodium chloride flush  3 mL Intravenous Q12H   terazosin  5 mg Oral QHS   Continuous Infusions:  sodium chloride 10 mL/hr at 11/03/19 0128   azithromycin (ZITHROMAX) 500 MG IVPB (Vial-Mate [employer]) 500 mg (11/05/19 0051)   cefTRIAXone (ROCEPHIN)  IV 200 mL/hr at 11/05/19 0000   PRN Meds: sodium chloride, acetaminophen **OR** acetaminophen, bisacodyl, hydrALAZINE, HYDROcodone-acetaminophen, morphine  injection, ondansetron **OR** ondansetron (ZOFRAN) IV, polyethylene glycol  Allergies:   No Known Allergies  Social History:   Social History   Socioeconomic History   Marital status: Married    Spouse name: Not on file   Number of children: Not on file   Years of education: Not on file   Highest education level: Not on file  Occupational History   Occupation: retired  Tobacco Use   Smoking status: Never Smoker   Smokeless tobacco: Never Used  Substance and Sexual Activity   Alcohol use: Yes    Comment: occasionally   Drug use: Never   Sexual activity: Not Currently    Partners: Female  Other Topics Concern   Not on file  Social History Narrative   Not on file   Social Determinants of Health   Financial Resource Strain:    Difficulty of Paying Living Expenses:   Food Insecurity:    Worried About Estate manager/land agent of Food in the Last Year:  Ran Out of Food in the Last Year:   Transportation Needs:    Film/video editor (Medical):    Lack of Transportation (Non-Medical):   Physical Activity:    Days of Exercise per Week:    Minutes of Exercise per Session:   Stress:    Feeling of Stress :   Social Connections:    Frequency of Communication with Friends and Family:    Frequency of Social Gatherings with Friends and Family:    Attends Religious Services:    Active Member of Clubs or Organizations:    Attends Music therapist:    Marital Status:   Intimate Partner Violence:    Fear of Current or Ex-Partner:    Emotionally Abused:    Physically Abused:    Sexually Abused:     Family History:   History reviewed. No pertinent family history.   ROS:  Please see the history of present illness.  ROS  All other ROS reviewed and negative.     Physical Exam/Data:   Vitals:   11/05/19 0300 11/05/19 0818 11/05/19 1142 11/05/19 1459  BP: (!) 159/92 (!) 144/89 117/73 128/77  Pulse: 85 70 78 70  Resp: 17 16 18 18   Temp:  97.7 F (36.5 C) (!) 97.5 F (36.4 C) 97.6 F (36.4 C) 98.3 F (36.8 C)  TempSrc: Oral Oral Oral Oral  SpO2: 96% 98% 97% 93%  Weight:      Height:        Intake/Output Summary (Last 24 hours) at 11/05/2019 1724 Last data filed at 11/05/2019 0829 Gross per 24 hour  Intake 1269.36 ml  Output 400 ml  Net 869.36 ml   Filed Weights   11/02/19 2315 11/03/19 0053  Weight: 71.8 kg 74.6 kg   Body mass index is 24.28 kg/m.  General:  Elderly gentleman sitting upright in bed eating dinner HEENT: sclera anicteric, right facial droop  Neck: no JVD Vascular: No carotid bruits; distal pulses 2+ bilaterally Cardiac:  normal S1, S2; IRIR; no obvious murmur, rub, or gallop Lungs:  clear to auscultation bilaterally, no wheezing, rhonchi or rales  Abd: NABS, soft, nontender, no hepatomegaly Ext: no edema Musculoskeletal:  No deformities, right sided hemiparesis Skin: warm and dry; chronic venous stasis changes to RLE  Neuro:  Right hemiparesis, dysarthia Psych:  Pleasant   EKG:  The EKG was personally reviewed and demonstrates:  Atrial fibrillation with rate 99 bpm, RBBB, TWI in inferior/anterolateral leads, no STE - no comparison available to review Telemetry:  Telemetry was personally reviewed and demonstrates:  Atrial fibrillation with SVR/CVR  Relevant CV Studies: 1. Left ventricular ejection fraction, by estimation, is 30 to 35%. The  left ventricle has moderate to severely decreased function. The left  ventricle demonstrates regional wall motion abnormalities (see scoring  diagram/findings for description). The  left ventricular internal cavity size was mildly dilated. Left ventricular  diastolic function could not be evaluated. There is severe hypokinesis of  the left ventricular, entire inferior wall, inferolateral wall and  inferoseptal wall.  2. Right ventricular systolic function is mildly reduced. The right  ventricular size is normal. There is moderately elevated pulmonary  artery  systolic pressure.  3. Left atrial size was severely dilated.  4. Right atrial size was severely dilated.  5. The mitral valve is normal in structure. Mild to moderate mitral valve  regurgitation.  6. Tricuspid valve regurgitation is mild to moderate.  7. The aortic valve is tricuspid. Aortic valve regurgitation is trivial.  Mild to moderate aortic valve sclerosis/calcification is present, without  any evidence of aortic stenosis.  8. Aortic dilatation noted. There is mild dilatation of the ascending  aorta measuring 44 mm.  9. The inferior vena cava is dilated in size with <50% respiratory  variability, suggesting right atrial pressure of 15 mmHg.   Laboratory Data:  Chemistry Recent Labs  Lab 11/03/19 0643 11/04/19 0240 11/05/19 0303  NA 144 143 144  K 4.0 3.7 3.6  CL 112* 111 111  CO2 24 22 24   GLUCOSE 159* 106* 119*  BUN 49* 49* 40*  CREATININE 2.08* 2.09* 1.92*  CALCIUM 8.1* 8.0* 8.4*  GFRNONAA 26* 26* 29*  GFRAA 30* 30* 34*  ANIONGAP 8 10 9     Recent Labs  Lab 11/02/19 2315 11/03/19 0643 11/05/19 0303  PROT 6.4* 5.6* 5.6*  ALBUMIN 2.7* 2.2* 2.3*  AST 25 25 20   ALT 18 19 18   ALKPHOS 88 76 68  BILITOT 1.0 0.8 0.7   Hematology Recent Labs  Lab 11/03/19 0643 11/04/19 0240 11/05/19 0303  WBC 10.5 5.9 4.6  RBC 3.23* 3.27* 3.30*  HGB 9.8* 9.8* 9.8*  HCT 32.2* 32.4* 32.5*  MCV 99.7 99.1 98.5  MCH 30.3 30.0 29.7  MCHC 30.4 30.2 30.2  RDW 15.8* 15.8* 15.3  PLT 90* 84* 89*   Cardiac EnzymesNo results for input(s): TROPONINI in the last 168 hours. No results for input(s): TROPIPOC in the last 168 hours.  BNPNo results for input(s): BNP, PROBNP in the last 168 hours.  DDimer No results for input(s): DDIMER in the last 168 hours.  Radiology/Studies:  CT HEAD WO CONTRAST  Result Date: 11/03/2019 CLINICAL DATA:  Fever and UTI.  Encephalopathy EXAM: CT HEAD WITHOUT CONTRAST TECHNIQUE: Contiguous axial images were obtained from the base of the  skull through the vertex without intravenous contrast. COMPARISON:  None. FINDINGS: Brain: Remote left MCA branch infarct affecting the frontal operculum and insula. Remote perforator infarct at the left basal ganglia and corona radiata. Mild for age chronic small vessel ischemia. No evidence of acute infarct, hemorrhage, hydrocephalus, or mass. Vascular: No hyperdense vessel or unexpected calcification. Skull: Normal. Negative for fracture or focal lesion. Sinuses/Orbits: No acute finding. IMPRESSION: 1. No acute or reversible finding. 2. Remote left MCA branch infarcts. Electronically Signed   By: Monte Fantasia M.D.   On: 11/03/2019 06:34   US RENAL  Result Date: 11/03/2019 CLINICAL DATA:  Sepsis. EXAM: RENAL / URINARY TRACT ULTRASOUND COMPLETE COMPARISON:  None. FINDINGS: Right Kidney: Renal measurements: 10.6 x 5.0 x 4.1 cm = volume: 112.9 mL. Renal cortical thinning and increased echogenicity suggesting medical renal disease. Simple appearing 7 cm cyst projecting off the upper pole region of the right kidney. Septated cyst measuring 4.5 x 4.5 x 4.8 cm projecting off the inferior and medial aspect of the right kidney. No hydronephrosis. Left Kidney: Renal measurements: 9.1 x 4.6 x 4.0 cm = volume: 86.4 mL. Renal cortical thinning and increased echogenicity suggesting medical renal disease. 1.3 cm simple appearing cyst. No hydronephrosis. Bladder: The bladder demonstrates mild trabeculation and may be due to mild bladder outlet obstruction from a slightly enlarged prostate gland. No bladder mass or calculi. Other: None. IMPRESSION: 1. Bilateral renal cysts. 2. Renal cortical thinning and increased echogenicity suggesting medical renal disease. 3. Mild prostate gland enlargement and mild trabeculation of the bladder. Electronically Signed   By: Marijo Sanes M.D.   On: 11/03/2019 10:33   DG Chest Port 1 View  Result Date: 11/02/2019  CLINICAL DATA:  Fever. EXAM: PORTABLE CHEST 1 VIEW COMPARISON:  None.  FINDINGS: Right chest port with tip in the SVC. Cardiomegaly with aortic atherosclerosis and tortuosity. Probable calcified mediastinal lymph node. Mild vascular congestion. Patchy bibasilar opacities, right greater than left. Small pleural effusions suspected. No pneumothorax. No acute osseous abnormalities are seen. IMPRESSION: 1. Cardiomegaly with vascular congestion. Probable small pleural effusions. 2. Patchy bibasilar opacities, right greater than left, may represent pneumonia in the setting of fever, alternatively atelectasis or asymmetric pulmonary edema is also considered. Aortic Atherosclerosis (ICD10-I70.0). Electronically Signed   By: Keith Rake M.D.   On: 11/02/2019 23:56   ECHOCARDIOGRAM COMPLETE  Result Date: 11/04/2019    ECHOCARDIOGRAM REPORT   Patient Name:   CURVIN HUNGER Date of Exam: 11/04/2019 Medical Rec #:  [id number]      Height:       69.0 in Accession #:    [id number]     Weight:       164.4 lb Date of Birth:  [birth year]      BSA:          1.901 m Patient Age:    89 years       BP:           131/90 mmHg Patient Gender: M              HR:           68 bpm. Exam Location:  Inpatient Procedure: 2D Echo Indications:    stroke 434.91  History:        Patient has no prior history of Echocardiogram examinations.                 CAD; chronic kidney disease.  Sonographer:    Johny Chess Referring Phys: [id number] [place]  1. Left ventricular ejection fraction, by estimation, is 30 to 35%. The left ventricle has moderate to severely decreased function. The left ventricle demonstrates regional wall motion abnormalities (see scoring diagram/findings for description). The left ventricular internal cavity size was mildly dilated. Left ventricular diastolic function could not be evaluated. There is severe hypokinesis of the left ventricular, entire inferior wall, inferolateral wall and inferoseptal wall.  2. Right ventricular systolic function is mildly reduced. The right  ventricular size is normal. There is moderately elevated pulmonary artery systolic pressure.  3. Left atrial size was severely dilated.  4. Right atrial size was severely dilated.  5. The mitral valve is normal in structure. Mild to moderate mitral valve regurgitation.  6. Tricuspid valve regurgitation is mild to moderate.  7. The aortic valve is tricuspid. Aortic valve regurgitation is trivial. Mild to moderate aortic valve sclerosis/calcification is present, without any evidence of aortic stenosis.  8. Aortic dilatation noted. There is mild dilatation of the ascending aorta measuring 44 mm.  9. The inferior vena cava is dilated in size with <50% respiratory variability, suggesting right atrial pressure of 15 mmHg. FINDINGS  Left Ventricle: Left ventricular ejection fraction, by estimation, is 30 to 35%. The left ventricle has moderate to severely decreased function. The left ventricle demonstrates regional wall motion abnormalities. Severe hypokinesis of the left ventricular, entire inferior wall, inferolateral wall and inferoseptal wall. The left ventricular internal cavity size was mildly dilated. There is no left ventricular hypertrophy. Left ventricular diastolic function could not be evaluated due to atrial fibrillation. Left ventricular diastolic function could not be evaluated. Right Ventricle: The right ventricular size is normal. No increase in right ventricular wall thickness.  Right ventricular systolic function is mildly reduced. There is moderately elevated pulmonary artery systolic pressure. The tricuspid regurgitant velocity is 2.97 m/s, and with an assumed right atrial pressure of 15 mmHg, the estimated right ventricular systolic pressure is 06.3 mmHg. Left Atrium: Left atrial size was severely dilated. Right Atrium: Right atrial size was severely dilated. Pericardium: There is no evidence of pericardial effusion. Mitral Valve: The mitral valve is normal in structure. Mild mitral annular  calcification. Mild to moderate mitral valve regurgitation, with centrally-directed jet. Tricuspid Valve: The tricuspid valve is normal in structure. Tricuspid valve regurgitation is mild to moderate. Aortic Valve: The aortic valve is tricuspid. . There is moderate thickening and mild calcification of the aortic valve. Aortic valve regurgitation is trivial. Mild to moderate aortic valve sclerosis/calcification is present, without any evidence of aortic stenosis. There is moderate thickening of the aortic valve. There is mild calcification of the aortic valve. Pulmonic Valve: The pulmonic valve was grossly normal. Pulmonic valve regurgitation is trivial. Aorta: Aortic dilatation noted. There is mild dilatation of the ascending aorta measuring 44 mm. Venous: The inferior vena cava is dilated in size with less than 50% respiratory variability, suggesting right atrial pressure of 15 mmHg. IAS/Shunts: No atrial level shunt detected by color flow Doppler.  LEFT VENTRICLE PLAX 2D LVIDd:         6.30 cm LVIDs:         5.50 cm LV PW:         1.10 cm LV IVS:        1.00 cm LVOT diam:     2.30 cm LVOT Area:     4.15 cm  IVC IVC diam: 2.50 cm LEFT ATRIUM             Index       RIGHT ATRIUM           Index LA diam:        4.40 cm 2.31 cm/m  RA Area:     17.10 cm LA Vol (A2C):   87.2 ml 45.87 ml/m RA Volume:   42.80 ml  22.52 ml/m LA Vol (A4C):   68.2 ml 35.88 ml/m LA Biplane Vol: 76.4 ml 40.19 ml/m   AORTA Ao Root diam: 4.30 cm Ao Asc diam:  4.40 cm TRICUSPID VALVE TR Peak grad:   35.3 mmHg TR Vmax:        297.00 cm/s  SHUNTS Systemic Diam: 2.30 cm Dani Gobble Croitoru MD Electronically signed by Sanda Klein MD Signature Date/Time: 11/04/2019/2:36:02 PM    Final     Assessment and Plan:   1. Acute combined CHF: patient presented with urosepsis. Echo performed this admission revealed EF 30-35% with inferior/inferolateral/inferoseptal wall hypokinesis, mildly reduced RV systolic function, severe biatrial enlargement, and  mild-moderate MR/TR. Likely this is somewhat chronic in nature. It has been 10+ years since he's seen a cardiologist. He has known CAD with prior PCI to unknown vessel. Suspicions are high for ischemic cardiomyopathy. Thankfully he has not had any anginal or volume overload complaints. At this time options are quite limited. He has CKD stage 4 with no interest in pursuing dialysis limiting invasive ischemic evaluation which could potentially worsen his CKD. Patient and family are not interested in invasive options at this time and prefer conservative management. Unfortunately medical management is limited no ACEi/ARB/ARNI due to CKD, no BBlocker due to baseline bradycardia, and unable to add hydral/imdur due to intermittently soft blood pressures.  - Family to keep a log of  blood pressures to determine if additional medications can be added down the road - Discussed low salt diet with the caveat being that calorie intake should take priority.   2. CAD s/p remote PCI: likely he has had progression of disease since his PCI 10-15 years ago. Thankfully no anginal complaints. LDL is well controlled. Not on aspirin given need for anticoagulation - Would send Rx for prn SL nitro at discharge in the event that he develops angina - Continue statin  3. Persistent atrial fibrillation: rates are slow-controlled this admission without AV nodal blocking agents. No complaints of bleeding with eliquis - Continue eliquis 2.5mg  BID (dose adjusted for age/renal function)  4. HLD: LDL 54 this admission.  - Continue statin  5. CKD stage 4: Cr 1.9, down from 2.3 on admission - suspect he is at baseline now.  - Continue to avoid nephrotoxic agents  6. Urosepsis: patient presented with fever, dysuria, hematuria, ad vomiting. UCx with klebsiella. On IV antibiotics. - Continue management per primary team  7. Possible PNA: CXR with possible PNA vs edema. ?aspiration. Swallow study recommended dysphagia diet. On IV  antibiotics - Continue management per primar team.   8. CVA: s/p left MCA stroke 06/2019 with residual right sided hemiparesis and dysarthia. Not on aspirin given need for anticoagulation. - Continue statin - Continue eliquis for stroke ppx    For questions or updates, please contact [facility] Please consult [email or web address] for contact info under Cardiology/STEMI.   Signed, Abigail Butts, PA-C  11/05/2019 5:24 PM [phone]

## 2019-11-06 ENCOUNTER — Telehealth: Payer: Self-pay | Admitting: Cardiology

## 2019-11-06 LAB — CBC WITH DIFFERENTIAL/PLATELET
Abs Immature Granulocytes: 0.04 10*3/uL (ref 0.00–0.07)
Basophils Absolute: 0 10*3/uL (ref 0.0–0.1)
Basophils Relative: 0 %
Eosinophils Absolute: 0.2 10*3/uL (ref 0.0–0.5)
Eosinophils Relative: 4 %
HCT: 34 % — ABNORMAL LOW (ref 39.0–52.0)
Hemoglobin: 10.5 g/dL — ABNORMAL LOW (ref 13.0–17.0)
Immature Granulocytes: 1 %
Lymphocytes Relative: 37 %
Lymphs Abs: 1.6 10*3/uL (ref 0.7–4.0)
MCH: 30.5 pg (ref 26.0–34.0)
MCHC: 30.9 g/dL (ref 30.0–36.0)
MCV: 98.8 fL (ref 80.0–100.0)
Monocytes Absolute: 0.3 10*3/uL (ref 0.1–1.0)
Monocytes Relative: 8 %
Neutro Abs: 2.2 10*3/uL (ref 1.7–7.7)
Neutrophils Relative %: 50 %
Platelets: 80 10*3/uL — ABNORMAL LOW (ref 150–400)
RBC: 3.44 MIL/uL — ABNORMAL LOW (ref 4.22–5.81)
RDW: 14.9 % (ref 11.5–15.5)
WBC: 4.3 10*3/uL (ref 4.0–10.5)
nRBC: 0 % (ref 0.0–0.2)

## 2019-11-06 LAB — COMPREHENSIVE METABOLIC PANEL
ALT: 19 U/L (ref 0–44)
AST: 24 U/L (ref 15–41)
Albumin: 2.3 g/dL — ABNORMAL LOW (ref 3.5–5.0)
Alkaline Phosphatase: 66 U/L (ref 38–126)
Anion gap: 9 (ref 5–15)
BUN: 33 mg/dL — ABNORMAL HIGH (ref 8–23)
CO2: 21 mmol/L — ABNORMAL LOW (ref 22–32)
Calcium: 8.4 mg/dL — ABNORMAL LOW (ref 8.9–10.3)
Chloride: 112 mmol/L — ABNORMAL HIGH (ref 98–111)
Creatinine, Ser: 1.75 mg/dL — ABNORMAL HIGH (ref 0.61–1.24)
GFR calc Af Amer: 38 mL/min — ABNORMAL LOW (ref >60)
GFR calc non Af Amer: 32 mL/min — ABNORMAL LOW (ref >60)
Glucose, Bld: 127 mg/dL — ABNORMAL HIGH (ref 70–99)
Potassium: 3.6 mmol/L (ref 3.5–5.1)
Sodium: 142 mmol/L (ref 135–145)
Total Bilirubin: 0.7 mg/dL (ref 0.3–1.2)
Total Protein: 5.6 g/dL — ABNORMAL LOW (ref 6.5–8.1)

## 2019-11-06 LAB — PHOSPHORUS: Phosphorus: 4.1 mg/dL (ref 2.5–4.6)

## 2019-11-06 LAB — MAGNESIUM: Magnesium: 1.9 mg/dL (ref 1.7–2.4)

## 2019-11-06 LAB — PROCALCITONIN: Procalcitonin: 7.93 ng/mL

## 2019-11-06 LAB — HEPARIN LEVEL (UNFRACTIONATED): Heparin Unfractionated: 1.14 IU/mL — ABNORMAL HIGH (ref 0.30–0.70)

## 2019-11-06 LAB — APTT: aPTT: 34 seconds (ref 24–36)

## 2019-11-06 MED ORDER — VANCOMYCIN HCL 500 MG/100ML IV SOLN
500.0000 mg | INTRAVENOUS | Status: DC
  Filled 2019-11-06: qty 100

## 2019-11-06 MED ORDER — SODIUM CHLORIDE 0.9 % IV SOLN
2.0000 g | INTRAVENOUS | Status: DC
  Administered 2019-11-06: 2 g via INTRAVENOUS
  Filled 2019-11-06: qty 2

## 2019-11-06 MED ORDER — VANCOMYCIN HCL 1250 MG/250ML IV SOLN
1250.0000 mg | Freq: Once | INTRAVENOUS | Status: AC
  Administered 2019-11-06: 1250 mg via INTRAVENOUS
  Filled 2019-11-06: qty 250

## 2019-11-06 NOTE — Progress Notes (Signed)
PROGRESS NOTE    Robert Mcconnell  FIE:[id number] DOB: 1921/07/16 DOA: 11/02/2019 PCP: [name], MD   Brief Narrative: 86 year old with past medical history significant for CAD status post stent, CVA February with residual right hemiparesis, stage IV CKD who presented with fever.  Patient reporter small amount of blood coming out from his penis after urination 3 days ago.  Patient also developed chills, high fever and vomiting.  He was not able to take Tylenol.  Temperature as high as 102.  He does have CKD and he will not pursued hemodialysis.  He has a past medical history of A. fib as well.  Recently moved from [place] to live with his daughter.  He reporter  Dysuria.  He was hypotensive on admission, UA consistent with UTI and chest x-ray with possible pneumonia.     Assessment & Plan:   Principal Problem:   Sepsis secondary to UTI [facility]) Active Problems:   History of ischemic stroke   CAD (coronary artery disease)   CKD (chronic kidney disease) stage 4, GFR 15-29 ml/min (HCC)   Dysphagia as late effect of cerebrovascular accident (CVA)   Acute renal failure superimposed on stage 4 chronic kidney disease (HCC)   Right hemiparesis (HCC)   Atrial fibrillation, chronic (HCC)   Severe sepsis (HCC)   CAP (community acquired pneumonia)   Dysphagia   Pressure injury of skin   1-Severe sepsis, secondary to UTI Klebsiella pneumoniae lactobacillus growing in urine versus CAP; -Patient presented with fever temperature more than 102, lactic acidosis, hypotension.  Source of infection UA versus pneumonia -He received IV fluids. -Ultrasound was negative for nephrolithiasis -Continue with ceftriaxone and azithromycin. -NSL fluids.  -sepsis parameter improving, trending down. Day 5 antibiotics.    2-Dysphagia: Patient with dysphagia since his CVA. Continue with dysphagia 2 diet Aspiration precaution  3-History of CVA: Marked deficit with right hemiparesis, right facial  droop, dysphagia and dysarthria Continue with Remeron Resume Eliquis  New cardiomyopathy, Ef 35 %; Wife was not aware of prior history of heart failure Cardiology consulted. Conservative management.  Appreciate cardiology evaluation.   Acute on chronic stage IV CKD: Creatinine baseline 2.3 Received IV fluids. Hold IV fluids due to heart failure, cardiomyopathy Creatinine trending down today of 1.75  CAD/A. fib chronic Continue with Eliquis Continue Lipitor  BPH: Continue with finasteride and Terazosin  Pressure injury, sacrum; stage 2, POA; local care  Pressure Injury 11/03/19 Sacrum Posterior Stage 2 -  Partial thickness loss of dermis presenting as a shallow open injury with a red, pink wound bed without slough. (Active)  11/03/19 1100  Location: Sacrum  Location Orientation: Posterior  Staging: Stage 2 -  Partial thickness loss of dermis presenting as a shallow open injury with a red, pink wound bed without slough.  Wound Description (Comments):   Present on Admission: Yes                  Estimated body mass index is 24.28 kg/m as calculated from the following:   Height as of this encounter: 5\' 9"  (1.753 m).   Weight as of this encounter: 74.6 kg.   DVT prophylaxis: Eliquis Code Status: DNR Family Communication: Wife at bedside Disposition Plan:  Status is: Inpatient  Remains inpatient appropriate because:Hemodynamically unstable   Dispo: The patient is from: Home              Anticipated d/c is to: Home              Anticipated  d/c date is: 2 days              Patient currently is not medically stable to d/c.  Continue with IV antibiotics, further evaluation for new cardiomyopathy by cardiology        Consultants:  Cardiology  Procedures:   None  Antimicrobials:  Ceftriaxone and azithromycin  Subjective: dysarthric speech, denies pain, denies dyspnea.   Objective: Vitals:   11/05/19 2334 11/06/19 0239 11/06/19 0734 11/06/19 1118    BP: 138/89 132/88 (!) 135/93 134/67  Pulse: 65 78 80 62  Resp: 18 17 20 18   Temp: 97.8 F (36.6 C) 97.6 F (36.4 C) 97.9 F (36.6 C) 97.9 F (36.6 C)  TempSrc: Axillary Axillary Oral Axillary  SpO2: 96% 99% 98% 99%  Weight:      Height:        Intake/Output Summary (Last 24 hours) at 11/06/2019 1257 Last data filed at 11/06/2019 0900 Gross per 24 hour  Intake 240 ml  Output 925 ml  Net -685 ml   Filed Weights   11/02/19 2315 11/03/19 0053  Weight: 71.8 kg 74.6 kg    Examination:  General exam: NAD Respiratory system: CTA Cardiovascular system: S 1, S 2 RRR Gastrointestinal system: BS present, soft, t Central nervous system: Alert , dysarthric speech Extremities: trace edema  Data Reviewed: I have personally reviewed following labs and imaging studies  CBC: Recent Labs  Lab 11/02/19 2315 11/03/19 0643 11/04/19 0240 11/05/19 0303 11/06/19 0318  WBC 9.1 10.5 5.9 4.6 4.3  NEUTROABS 8.2* 9.0*  --  2.8 2.2  HGB 11.5* 9.8* 9.8* 9.8* 10.5*  HCT 37.9* 32.2* 32.4* 32.5* 34.0*  MCV 99.5 99.7 99.1 98.5 98.8  PLT 99* 90* 84* 89* 80*   Basic Metabolic Panel: Recent Labs  Lab 11/02/19 2315 11/03/19 0643 11/04/19 0240 11/05/19 0303 11/06/19 0318  NA 143 144 143 144 142  K 3.9 4.0 3.7 3.6 3.6  CL 109 112* 111 111 112*  CO2 22 24 22 24  21*  GLUCOSE 179* 159* 106* 119* 127*  BUN 52* 49* 49* 40* 33*  CREATININE 2.32* 2.08* 2.09* 1.92* 1.75*  CALCIUM 8.0* 8.1* 8.0* 8.4* 8.4*  MG  --   --   --  2.0 1.9  PHOS  --   --   --  4.0 4.1   GFR: Estimated Creatinine Clearance: 25.3 mL/min (A) (by C-G formula based on SCr of 1.75 mg/dL (H)). Liver Function Tests: Recent Labs  Lab 11/02/19 2315 11/03/19 0643 11/05/19 0303 11/06/19 0318  AST 25 25 20 24   ALT 18 19 18 19   ALKPHOS 88 76 68 66  BILITOT 1.0 0.8 0.7 0.7  PROT 6.4* 5.6* 5.6* 5.6*  ALBUMIN 2.7* 2.2* 2.3* 2.3*   No results for input(s): LIPASE, AMYLASE in the last 168 hours. No results for input(s):  AMMONIA in the last 168 hours. Coagulation Profile: Recent Labs  Lab 11/02/19 2315  INR 1.3*   Cardiac Enzymes: No results for input(s): CKTOTAL, CKMB, CKMBINDEX, TROPONINI in the last 168 hours. BNP (last 3 results) No results for input(s): PROBNP in the last 8760 hours. HbA1C: No results for input(s): HGBA1C in the last 72 hours. CBG: No results for input(s): GLUCAP in the last 168 hours. Lipid Profile: Recent Labs    11/05/19 0303  CHOL 98  HDL 28*  LDLCALC 54  TRIG 79  CHOLHDL 3.5   Thyroid Function Tests: No results for input(s): TSH, T4TOTAL, FREET4, T3FREE, THYROIDAB in the last 72  hours. Anemia Panel: No results for input(s): VITAMINB12, FOLATE, FERRITIN, TIBC, IRON, RETICCTPCT in the last 72 hours. Sepsis Labs: Recent Labs  Lab 11/03/19 0643 11/03/19 5400 11/03/19 0839 11/03/19 0902 11/04/19 1035 11/04/19 1250 11/05/19 0303 11/06/19 0318  PROCALCITON 18.11   < >  --  1.02 20.75  --  15.25 7.93  LATICACIDVEN 1.7  --  1.2  --  1.4 1.2  --   --    < > = values in this interval not displayed.    Recent Results (from the past 240 hour(s))  Blood Culture (routine x 2)     Status: None (Preliminary result)   Collection Time: 11/02/19 11:15 PM   Specimen: BLOOD LEFT ARM  Result Value Ref Range Status   Specimen Description   Final    BLOOD LEFT ARM Performed at [facility], [address] [place], [place] [address]    Special Requests   Final    BOTTLES DRAWN AEROBIC AND ANAEROBIC Blood Culture adequate volume Performed at [facility] [address] [facility], [address] [place], [place] [address]    Culture  Setup Time   Final    AEROBIC BOTTLE ONLY GRAM POSITIVE RODS CRITICAL VALUE NOTED.  VALUE IS CONSISTENT WITH PREVIOUSLY REPORTED AND CALLED VALUE.    Culture   Final    CULTURE REINCUBATED FOR BETTER GROWTH Performed at [facility], [place] [address], [place], [place] [address]    Report Status PENDING  Incomplete  Blood Culture  (routine x 2)     Status: None (Preliminary result)   Collection Time: 11/02/19 11:20 PM   Specimen: BLOOD RIGHT FOREARM  Result Value Ref Range Status   Specimen Description   Final    BLOOD RIGHT FOREARM Performed at [facility], [address] [place], [place] [address]    Special Requests   Final    BOTTLES DRAWN AEROBIC AND ANAEROBIC Blood Culture adequate volume Performed at [facility], [address] [place], [place] [address]    Culture  Setup Time   Final    GRAM POSITIVE RODS ANAEROBIC BOTTLE ONLY CRITICAL RESULT CALLED TO, READ BACK BY AND VERIFIED WITH: Shellee Milo PHARMD, AT 2505 11/05/19 BY Rush Landmark Performed at [facility], [place] [address], [place], [place] [address]    Culture GRAM POSITIVE RODS  Final   Report Status PENDING  Incomplete  Urine culture     Status: Abnormal   Collection Time: 11/03/19 12:41 AM   Specimen: In/Out Cath Urine  Result Value Ref Range Status   Specimen Description   Final    IN/OUT CATH URINE Performed at [facility], [address] [place], [place] [address]    Special Requests   Final    NONE Performed at [facility], [address] [place], [place] [address]    Culture (A)  Final    >=100,000 COLONIES/mL KLEBSIELLA PNEUMONIAE >=100,000 COLONIES/mL LACTOBACILLUS SPECIES Standardized susceptibility testing for this organism is not available. Performed at [facility], [place] [address], [place], [place] [address]    Report Status 11/05/2019 FINAL  Final   Organism ID, Bacteria KLEBSIELLA PNEUMONIAE (A)  Final      Susceptibility   Klebsiella pneumoniae - MIC*    AMPICILLIN RESISTANT Resistant     CEFAZOLIN <=4 SENSITIVE Sensitive     CEFTRIAXONE <=0.25 SENSITIVE Sensitive     CIPROFLOXACIN <=0.25  SENSITIVE Sensitive     GENTAMICIN <=1 SENSITIVE Sensitive     IMIPENEM <=0.25 SENSITIVE Sensitive     NITROFURANTOIN 32 SENSITIVE Sensitive     TRIMETH/SULFA <=20  SENSITIVE Sensitive     AMPICILLIN/SULBACTAM <=2 SENSITIVE Sensitive     PIP/TAZO <=4 SENSITIVE Sensitive     * >=100,000 COLONIES/mL KLEBSIELLA PNEUMONIAE  SARS Coronavirus 2 by RT PCR (hospital order, performed in [place] hospital lab) Nasopharyngeal Nasopharyngeal Swab     Status: None   Collection Time: 11/03/19 12:41 AM   Specimen: Nasopharyngeal Swab  Result Value Ref Range Status   SARS Coronavirus 2 NEGATIVE NEGATIVE Final    Comment: (NOTE) SARS-CoV-2 target nucleic acids are NOT DETECTED.  The SARS-CoV-2 RNA is generally detectable in upper and lower respiratory specimens during the acute phase of infection. The lowest concentration of SARS-CoV-2 viral copies this assay can detect is 250 copies / mL. A negative result does not preclude SARS-CoV-2 infection and should not be used as the sole basis for treatment or other patient management decisions.  A negative result may occur with improper specimen collection / handling, submission of specimen other than nasopharyngeal swab, presence of viral mutation(s) within the areas targeted by this assay, and inadequate number of viral copies (<250 copies / mL). A negative result must be combined with clinical observations, patient history, and epidemiological information.  Fact Sheet for Patients:   StrictlyIdeas.no  Fact Sheet for Healthcare Providers: BankingDealers.co.za  This test is not yet approved or  cleared by the [place] FDA and has been authorized for detection and/or diagnosis of SARS-CoV-2 by FDA under an Emergency Use Authorization (EUA).  This EUA will remain in effect (meaning this test can be used) for the duration of the COVID-19 declaration under Section 564(b)(1) of the Act, 21 U.S.C. section 360bbb-3(b)(1), unless the authorization is terminated or revoked sooner.  Performed at [facility], [address] [place], [place] [address]     MRSA PCR Screening     Status: None   Collection Time: 11/03/19  5:25 AM   Specimen: Nasopharyngeal  Result Value Ref Range Status   MRSA by PCR NEGATIVE NEGATIVE Final    Comment:        The GeneXpert MRSA Assay (FDA approved for NASAL specimens only), is one component of a comprehensive MRSA colonization surveillance program. It is not intended to diagnose MRSA infection nor to guide or monitor treatment for MRSA infections. Performed at [facility], [place] [address] [place], [place] [address]          Radiology Studies: ECHOCARDIOGRAM COMPLETE  Result Date: 11/04/2019    ECHOCARDIOGRAM REPORT   Patient Name:   Robert Mcconnell Date of Exam: 11/04/2019 Medical Rec #:  [id number]      Height:       69.0 in Accession #:    [id number]     Weight:       164.4 lb Date of Birth:  [birth year]      BSA:          1.901 m Patient Age:    89 years       BP:           131/90 mmHg Patient Gender: M              HR:           68 bpm. Exam Location:  Inpatient Procedure: 2D Echo Indications:    stroke 434.91  History:  Patient has no prior history of Echocardiogram examinations.                 CAD; chronic kidney disease.  Sonographer:    Johny Chess Referring Phys: [id number] [place]  1. Left ventricular ejection fraction, by estimation, is 30 to 35%. The left ventricle has moderate to severely decreased function. The left ventricle demonstrates regional wall motion abnormalities (see scoring diagram/findings for description). The left ventricular internal cavity size was mildly dilated. Left ventricular diastolic function could not be evaluated. There is severe hypokinesis of the left ventricular, entire inferior wall, inferolateral wall and inferoseptal wall.  2. Right ventricular systolic function is mildly reduced. The right ventricular size is normal. There is moderately elevated pulmonary artery systolic pressure.  3. Left atrial size was severely dilated.  4.  Right atrial size was severely dilated.  5. The mitral valve is normal in structure. Mild to moderate mitral valve regurgitation.  6. Tricuspid valve regurgitation is mild to moderate.  7. The aortic valve is tricuspid. Aortic valve regurgitation is trivial. Mild to moderate aortic valve sclerosis/calcification is present, without any evidence of aortic stenosis.  8. Aortic dilatation noted. There is mild dilatation of the ascending aorta measuring 44 mm.  9. The inferior vena cava is dilated in size with <50% respiratory variability, suggesting right atrial pressure of 15 mmHg. FINDINGS  Left Ventricle: Left ventricular ejection fraction, by estimation, is 30 to 35%. The left ventricle has moderate to severely decreased function. The left ventricle demonstrates regional wall motion abnormalities. Severe hypokinesis of the left ventricular, entire inferior wall, inferolateral wall and inferoseptal wall. The left ventricular internal cavity size was mildly dilated. There is no left ventricular hypertrophy. Left ventricular diastolic function could not be evaluated due to atrial fibrillation. Left ventricular diastolic function could not be evaluated. Right Ventricle: The right ventricular size is normal. No increase in right ventricular wall thickness. Right ventricular systolic function is mildly reduced. There is moderately elevated pulmonary artery systolic pressure. The tricuspid regurgitant velocity is 2.97 m/s, and with an assumed right atrial pressure of 15 mmHg, the estimated right ventricular systolic pressure is 62.8 mmHg. Left Atrium: Left atrial size was severely dilated. Right Atrium: Right atrial size was severely dilated. Pericardium: There is no evidence of pericardial effusion. Mitral Valve: The mitral valve is normal in structure. Mild mitral annular calcification. Mild to moderate mitral valve regurgitation, with centrally-directed jet. Tricuspid Valve: The tricuspid valve is normal in structure.  Tricuspid valve regurgitation is mild to moderate. Aortic Valve: The aortic valve is tricuspid. . There is moderate thickening and mild calcification of the aortic valve. Aortic valve regurgitation is trivial. Mild to moderate aortic valve sclerosis/calcification is present, without any evidence of aortic stenosis. There is moderate thickening of the aortic valve. There is mild calcification of the aortic valve. Pulmonic Valve: The pulmonic valve was grossly normal. Pulmonic valve regurgitation is trivial. Aorta: Aortic dilatation noted. There is mild dilatation of the ascending aorta measuring 44 mm. Venous: The inferior vena cava is dilated in size with less than 50% respiratory variability, suggesting right atrial pressure of 15 mmHg. IAS/Shunts: No atrial level shunt detected by color flow Doppler.  LEFT VENTRICLE PLAX 2D LVIDd:         6.30 cm LVIDs:         5.50 cm LV PW:         1.10 cm LV IVS:        1.00 cm LVOT  diam:     2.30 cm LVOT Area:     4.15 cm  IVC IVC diam: 2.50 cm LEFT ATRIUM             Index       RIGHT ATRIUM           Index LA diam:        4.40 cm 2.31 cm/m  RA Area:     17.10 cm LA Vol (A2C):   87.2 ml 45.87 ml/m RA Volume:   42.80 ml  22.52 ml/m LA Vol (A4C):   68.2 ml 35.88 ml/m LA Biplane Vol: 76.4 ml 40.19 ml/m   AORTA Ao Root diam: 4.30 cm Ao Asc diam:  4.40 cm TRICUSPID VALVE TR Peak grad:   35.3 mmHg TR Vmax:        297.00 cm/s  SHUNTS Systemic Diam: 2.30 cm Dani Gobble Croitoru MD Electronically signed by Sanda Klein MD Signature Date/Time: 11/04/2019/2:36:02 PM    Final         Scheduled Meds: . apixaban  2.5 mg Oral BID  . atorvastatin  40 mg Oral BID  . calcitRIOL  2.25 mcg Oral Q M,W,F  . docusate sodium  100 mg Oral BID  . famotidine  20 mg Oral Daily  . finasteride  5 mg Oral Daily  . melatonin  9 mg Oral QHS  . mirtazapine  15 mg Oral QHS  . senna  1 tablet Oral Daily  . sodium chloride flush  3 mL Intravenous Q12H  . terazosin  5 mg Oral QHS    Continuous Infusions: . sodium chloride 10 mL/hr at 11/03/19 0128  . azithromycin (ZITHROMAX) 500 MG IVPB (Vial-Mate [employer]) 500 mg (11/06/19 0100)  . cefTRIAXone (ROCEPHIN)  IV 1 g (11/06/19 0019)     LOS: 3 days    Time spent: 35 minutes    Greydon Betke A Deng Kemler, MD [facility]   If 7PM-7AM, please contact night-coverage [email or web address]  11/06/2019, 12:57 PM

## 2019-11-06 NOTE — Telephone Encounter (Signed)
Patient has a TOC scheduled 7/20 with Coletta Memos

## 2019-11-06 NOTE — Telephone Encounter (Signed)
Patient currently admitted.   Routed to NL scheduling/PCC pool to assist with appointment

## 2019-11-06 NOTE — Progress Notes (Signed)
Pharmacy Antibiotic Note  Robert Mcconnell is a 86 y.o. male admitted on 11/02/2019 with fever. He has been receiving treatment for pneumonia/UTI. Now with gram positive rods in both sets of blood cultures from  6/27. Per lab, ID of gram positive rods will result tomorrow.  Pharmacy has been consulted for vancomycin dosing. Scr 1.75 with CrCl 25 ml/min. WBC WNL and patient afebrile.   Plan: Vancomycin 1250 mg X 1 Then 500 mg every 24 hours  Given patient's age and AKI at admission, going more conservative for dosing F/u culture ID to narrow    Height: 5\' 9"  (175.3 cm) Weight: 74.6 kg (164 lb 6.4 oz) IBW/kg (Calculated) : 70.7  Temp (24hrs), Avg:97.9 F (36.6 C), Min:97.6 F (36.4 C), Max:98.3 F (36.8 C)  Recent Labs  Lab 11/02/19 2315 11/02/19 2315 11/03/19 0153 11/03/19 6153 11/03/19 0839 11/04/19 0240 11/04/19 1035 11/04/19 1250 11/05/19 0303 11/06/19 0318  WBC 9.1  --   --  10.5  --  5.9  --   --  4.6 4.3  CREATININE 2.32*  --   --  2.08*  --  2.09*  --   --  1.92* 1.75*  LATICACIDVEN 2.8*   < > 2.4* 1.7 1.2  --  1.4 1.2  --   --    < > = values in this interval not displayed.    Estimated Creatinine Clearance: 25.3 mL/min (A) (by C-G formula based on SCr of 1.75 mg/dL (H)).    No Known Allergies    Thank you for allowing pharmacy to be a part of this patient's care.  Jimmy Footman, PharmD, BCPS, BCIDP Infectious Diseases Clinical Pharmacist Phone: [phone] 11/06/2019 2:14 PM

## 2019-11-06 NOTE — Plan of Care (Signed)
  Problem: Clinical Measurements: Goal: Respiratory complications will improve Outcome: Progressing Goal: Cardiovascular complication will be avoided Outcome: Progressing   Problem: Activity: Goal: Risk for activity intolerance will decrease Outcome: Progressing   Problem: Nutrition: Goal: Adequate nutrition will be maintained Outcome: Progressing   Problem: Coping: Goal: Level of anxiety will decrease Outcome: Progressing   Problem: Elimination: Goal: Will not experience complications related to bowel motility Outcome: Progressing Goal: Will not experience complications related to urinary retention Outcome: Progressing   Problem: Pain Managment: Goal: General experience of comfort will improve Outcome: Progressing   Problem: Safety: Goal: Ability to remain free from injury will improve Outcome: Progressing   Problem: Skin Integrity: Goal: Risk for impaired skin integrity will decrease Outcome: Progressing

## 2019-11-06 NOTE — Telephone Encounter (Signed)
-----   Message from Abigail Butts, PA-C sent at 11/05/2019  7:07 PM EDT ----- Regarding: TOC visit Please schedule this patient for a follow-up appointment and call them with that information.  Primary Cardiologist: Dr. Harrell Gave Date of Discharge: 11/05/2019 Appointment Needed Within: 2-3 weeks Appointment Type: Please arrange a TOC visit with Dr. Gweneth Dimitri and call the patient with the appointment date/time.   Thank you!  Roby Lofts, PA-C

## 2019-11-06 NOTE — Progress Notes (Signed)
PROGRESS NOTE    Robert Mcconnell  TKZ:[id number] DOB: 08-Sep-1921 DOA: 11/02/2019 PCP: [name], MD   Brief Narrative: 86 year old with past medical history significant for CAD status post stent, CVA February with residual right hemiparesis, stage IV CKD who presented with fever.  Patient reporter small amount of blood coming out from his penis after urination 3 days ago.  Patient also developed chills, high fever and vomiting.  He was not able to take Tylenol.  Temperature as high as 102.  He does have CKD and he will not pursued hemodialysis.  He has a past medical history of A. fib as well.  Recently moved from [place] to live with his daughter.  He reporter  Dysuria.  He was hypotensive on admission, UA consistent with UTI and chest x-ray with possible pneumonia.     Assessment & Plan:   Principal Problem:   Sepsis secondary to UTI [facility] Active Problems:   History of ischemic stroke   CAD (coronary artery disease)   CKD (chronic kidney disease) stage 4, GFR 15-29 ml/min (HCC)   Dysphagia as late effect of cerebrovascular accident (CVA)   Acute renal failure superimposed on stage 4 chronic kidney disease (HCC)   Right hemiparesis (HCC)   Atrial fibrillation, chronic (HCC)   Severe sepsis (HCC)   CAP (community acquired pneumonia)   Dysphagia   Pressure injury of skin   1-Severe sepsis, secondary to UTI Klebsiella pneumoniae lactobacillus growing in urine versus CAP; Gram Positive Rods Bacteremia;  -Patient presented with fever temperature more than 102, lactic acidosis, hypotension.  Source of infection UA versus pneumonia -He received IV fluids. -Ultrasound was negative for nephrolithiasis -Continue with ceftriaxone and azithromycin. -NSL fluids.  -Sepsis parameter improving, trending down. Day 5 antibiotics.  -Blood cultures now growing Gram positive rods. Awaiting for identification tomorrow.   2-Dysphagia: Patient with dysphagia since his CVA. Continue with  dysphagia 2 diet Aspiration precaution  3-History of CVA: Marked deficit with right hemiparesis, right facial droop, dysphagia and dysarthria Continue with Remeron Resume Eliquis  New cardiomyopathy, Ef 35 %; Wife was not aware of prior history of heart failure Cardiology consulted. Conservative management.  Appreciate cardiology evaluation.   Acute on chronic stage IV CKD: Creatinine baseline 2.3 Received IV fluids. Hold IV fluids due to heart failure, cardiomyopathy Creatinine trending down today of 1.75  CAD/A. fib chronic Continue with Eliquis Continue Lipitor  BPH: Continue with finasteride and Terazosin  Pressure injury, sacrum; stage 2, POA; local care  Pressure Injury 11/03/19 Sacrum Posterior Stage 2 -  Partial thickness loss of dermis presenting as a shallow open injury with a red, pink wound bed without slough. (Active)  11/03/19 1100  Location: Sacrum  Location Orientation: Posterior  Staging: Stage 2 -  Partial thickness loss of dermis presenting as a shallow open injury with a red, pink wound bed without slough.  Wound Description (Comments):   Present on Admission: Yes                  Estimated body mass index is 24.28 kg/m as calculated from the following:   Height as of this encounter: 5\' 9"  (1.753 m).   Weight as of this encounter: 74.6 kg.   DVT prophylaxis: Eliquis Code Status: DNR Family Communication: Wife at bedside Disposition Plan:  Status is: Inpatient  Remains inpatient appropriate because:Hemodynamically unstable   Dispo: The patient is from: Home              Anticipated d/c is  to: Home              Anticipated d/c date is: 2 days              Patient currently is not medically stable to d/c.  Continue with IV antibiotics, further evaluation for new cardiomyopathy by cardiology        Consultants:  Cardiology  Procedures:   None  Antimicrobials:  Ceftriaxone and azithromycin  Subjective: dysarthric  speech, denies pain, denies dyspnea.   Objective: Vitals:   11/05/19 2334 11/06/19 0239 11/06/19 0734 11/06/19 1118  BP: 138/89 132/88 (!) 135/93 134/67  Pulse: 65 78 80 62  Resp: 18 17 20 18   Temp: 97.8 F (36.6 C) 97.6 F (36.4 C) 97.9 F (36.6 C) 97.9 F (36.6 C)  TempSrc: Axillary Axillary Oral Axillary  SpO2: 96% 99% 98% 99%  Weight:      Height:        Intake/Output Summary (Last 24 hours) at 11/06/2019 1358 Last data filed at 11/06/2019 0900 Gross per 24 hour  Intake 240 ml  Output 925 ml  Net -685 ml   Filed Weights   11/02/19 2315 11/03/19 0053  Weight: 71.8 kg 74.6 kg    Examination:  General exam: NAD Respiratory system: CTA Cardiovascular system: S 1, S 2 RRR Gastrointestinal system: BS present, soft, t Central nervous system: Alert , dysarthric speech Extremities: trace edema  Data Reviewed: I have personally reviewed following labs and imaging studies  CBC: Recent Labs  Lab 11/02/19 2315 11/03/19 0643 11/04/19 0240 11/05/19 0303 11/06/19 0318  WBC 9.1 10.5 5.9 4.6 4.3  NEUTROABS 8.2* 9.0*  --  2.8 2.2  HGB 11.5* 9.8* 9.8* 9.8* 10.5*  HCT 37.9* 32.2* 32.4* 32.5* 34.0*  MCV 99.5 99.7 99.1 98.5 98.8  PLT 99* 90* 84* 89* 80*   Basic Metabolic Panel: Recent Labs  Lab 11/02/19 2315 11/03/19 0643 11/04/19 0240 11/05/19 0303 11/06/19 0318  NA 143 144 143 144 142  K 3.9 4.0 3.7 3.6 3.6  CL 109 112* 111 111 112*  CO2 22 24 22 24  21*  GLUCOSE 179* 159* 106* 119* 127*  BUN 52* 49* 49* 40* 33*  CREATININE 2.32* 2.08* 2.09* 1.92* 1.75*  CALCIUM 8.0* 8.1* 8.0* 8.4* 8.4*  MG  --   --   --  2.0 1.9  PHOS  --   --   --  4.0 4.1   GFR: Estimated Creatinine Clearance: 25.3 mL/min (A) (by C-G formula based on SCr of 1.75 mg/dL (H)). Liver Function Tests: Recent Labs  Lab 11/02/19 2315 11/03/19 0643 11/05/19 0303 11/06/19 0318  AST 25 25 20 24   ALT 18 19 18 19   ALKPHOS 88 76 68 66  BILITOT 1.0 0.8 0.7 0.7  PROT 6.4* 5.6* 5.6* 5.6*  ALBUMIN  2.7* 2.2* 2.3* 2.3*   No results for input(s): LIPASE, AMYLASE in the last 168 hours. No results for input(s): AMMONIA in the last 168 hours. Coagulation Profile: Recent Labs  Lab 11/02/19 2315  INR 1.3*   Cardiac Enzymes: No results for input(s): CKTOTAL, CKMB, CKMBINDEX, TROPONINI in the last 168 hours. BNP (last 3 results) No results for input(s): PROBNP in the last 8760 hours. HbA1C: No results for input(s): HGBA1C in the last 72 hours. CBG: No results for input(s): GLUCAP in the last 168 hours. Lipid Profile: Recent Labs    11/05/19 0303  CHOL 98  HDL 28*  LDLCALC 54  TRIG 79  CHOLHDL 3.5   Thyroid  Function Tests: No results for input(s): TSH, T4TOTAL, FREET4, T3FREE, THYROIDAB in the last 72 hours. Anemia Panel: No results for input(s): VITAMINB12, FOLATE, FERRITIN, TIBC, IRON, RETICCTPCT in the last 72 hours. Sepsis Labs: Recent Labs  Lab 11/03/19 0643 11/03/19 7106 11/03/19 0839 11/03/19 0902 11/04/19 1035 11/04/19 1250 11/05/19 0303 11/06/19 0318  PROCALCITON 18.11   < >  --  1.02 20.75  --  15.25 7.93  LATICACIDVEN 1.7  --  1.2  --  1.4 1.2  --   --    < > = values in this interval not displayed.    Recent Results (from the past 240 hour(s))  Blood Culture (routine x 2)     Status: None (Preliminary result)   Collection Time: 11/02/19 11:15 PM   Specimen: BLOOD LEFT ARM  Result Value Ref Range Status   Specimen Description   Final    BLOOD LEFT ARM Performed at [facility], [address] [place], [place] [address]    Special Requests   Final    BOTTLES DRAWN AEROBIC AND ANAEROBIC Blood Culture adequate volume Performed at [facility], [address] [place], [place] [address]    Culture  Setup Time   Final    AEROBIC BOTTLE ONLY GRAM POSITIVE RODS CRITICAL VALUE NOTED.  VALUE IS CONSISTENT WITH PREVIOUSLY REPORTED AND CALLED VALUE.    Culture   Final    CULTURE REINCUBATED FOR BETTER GROWTH Performed at [facility], [place] [address], [place], [place] [address]    Report Status PENDING  Incomplete  Blood Culture (routine x 2)     Status: None (Preliminary result)   Collection Time: 11/02/19 11:20 PM   Specimen: BLOOD RIGHT FOREARM  Result Value Ref Range Status   Specimen Description   Final    BLOOD RIGHT FOREARM Performed at [facility], [address] [place], [place] [address]    Special Requests   Final    BOTTLES DRAWN AEROBIC AND ANAEROBIC Blood Culture adequate volume Performed at [facility], [address] [place], [place] [address]    Culture  Setup Time   Final    GRAM POSITIVE RODS ANAEROBIC BOTTLE ONLY CRITICAL RESULT CALLED TO, READ BACK BY AND VERIFIED WITH: Shellee Milo PHARMD, AT 1696 11/05/19 BY Rush Landmark Performed at [facility], [place] [address] [place], [place] [address]    Culture GRAM POSITIVE RODS  Final   Report Status PENDING  Incomplete  Urine culture     Status: Abnormal   Collection Time: 11/03/19 12:41 AM   Specimen: In/Out Cath Urine  Result Value Ref Range Status   Specimen Description   Final    IN/OUT CATH URINE Performed at [facility], [address] [place], [place] [address]    Special Requests   Final    NONE Performed at [facility], [address] [place], [place] [address]    Culture (A)  Final    >=100,000 COLONIES/mL KLEBSIELLA PNEUMONIAE >=100,000 COLONIES/mL LACTOBACILLUS SPECIES Standardized susceptibility testing for this organism is not available. Performed at [facility], [place] [address] [place],  [address]    Report Status 11/05/2019 FINAL  Final   Organism ID, Bacteria KLEBSIELLA PNEUMONIAE (A)  Final      Susceptibility   Klebsiella pneumoniae - MIC*    AMPICILLIN RESISTANT Resistant     CEFAZOLIN <=4 SENSITIVE  Sensitive     CEFTRIAXONE <=0.25 SENSITIVE Sensitive     CIPROFLOXACIN <=0.25 SENSITIVE Sensitive     GENTAMICIN <=1 SENSITIVE Sensitive      IMIPENEM <=0.25 SENSITIVE Sensitive     NITROFURANTOIN 32 SENSITIVE Sensitive     TRIMETH/SULFA <=20 SENSITIVE Sensitive     AMPICILLIN/SULBACTAM <=2 SENSITIVE Sensitive     PIP/TAZO <=4 SENSITIVE Sensitive     * >=100,000 COLONIES/mL KLEBSIELLA PNEUMONIAE  SARS Coronavirus 2 by RT PCR (hospital order, performed in [place] hospital lab) Nasopharyngeal Nasopharyngeal Swab     Status: None   Collection Time: 11/03/19 12:41 AM   Specimen: Nasopharyngeal Swab  Result Value Ref Range Status   SARS Coronavirus 2 NEGATIVE NEGATIVE Final    Comment: (NOTE) SARS-CoV-2 target nucleic acids are NOT DETECTED.  The SARS-CoV-2 RNA is generally detectable in upper and lower respiratory specimens during the acute phase of infection. The lowest concentration of SARS-CoV-2 viral copies this assay can detect is 250 copies / mL. A negative result does not preclude SARS-CoV-2 infection and should not be used as the sole basis for treatment or other patient management decisions.  A negative result may occur with improper specimen collection / handling, submission of specimen other than nasopharyngeal swab, presence of viral mutation(s) within the areas targeted by this assay, and inadequate number of viral copies (<250 copies / mL). A negative result must be combined with clinical observations, patient history, and epidemiological information.  Fact Sheet for Patients:   StrictlyIdeas.no  Fact Sheet for Healthcare Providers: BankingDealers.co.za  This test is not yet approved or  cleared by the [place] FDA and has been authorized for detection and/or diagnosis of SARS-CoV-2 by FDA under an Emergency Use Authorization (EUA).  This EUA will remain in effect (meaning this test can be used) for the duration of the COVID-19 declaration under Section 564(b)(1) of the Act, 21 U.S.C. section 360bbb-3(b)(1), unless the authorization is terminated  or revoked sooner.  Performed at [facility], [address] [place], [place] [address]   MRSA PCR Screening     Status: None   Collection Time: 11/03/19  5:25 AM   Specimen: Nasopharyngeal  Result Value Ref Range Status   MRSA by PCR NEGATIVE NEGATIVE Final    Comment:        The GeneXpert MRSA Assay (FDA approved for NASAL specimens only), is one component of a comprehensive MRSA colonization surveillance program. It is not intended to diagnose MRSA infection nor to guide or monitor treatment for MRSA infections. Performed at [facility], [place] [address], [place], [place] [address]          Radiology Studies: ECHOCARDIOGRAM COMPLETE  Result Date: 11/04/2019    ECHOCARDIOGRAM REPORT   Patient Name:   Robert Mcconnell Date of Exam: 11/04/2019 Medical Rec #:  [id number]      Height:       69.0 in Accession #:    [id number]     Weight:       164.4 lb Date of Birth:  [birth year]      BSA:          1.901 m Patient Age:    89 years       BP:           131/90 mmHg Patient Gender: M              HR:           68 bpm. Exam  Location:  Inpatient Procedure: 2D Echo Indications:    stroke 434.91  History:        Patient has no prior history of Echocardiogram examinations.                 CAD; chronic kidney disease.  Sonographer:    Johny Chess Referring Phys: [id number] [place]  1. Left ventricular ejection fraction, by estimation, is 30 to 35%. The left ventricle has moderate to severely decreased function. The left ventricle demonstrates regional wall motion abnormalities (see scoring diagram/findings for description). The left ventricular internal cavity size was mildly dilated. Left ventricular diastolic function could not be evaluated. There is severe hypokinesis of the left ventricular, entire inferior wall, inferolateral wall and inferoseptal wall.  2. Right ventricular systolic function is mildly reduced. The right ventricular size is normal. There is  moderately elevated pulmonary artery systolic pressure.  3. Left atrial size was severely dilated.  4. Right atrial size was severely dilated.  5. The mitral valve is normal in structure. Mild to moderate mitral valve regurgitation.  6. Tricuspid valve regurgitation is mild to moderate.  7. The aortic valve is tricuspid. Aortic valve regurgitation is trivial. Mild to moderate aortic valve sclerosis/calcification is present, without any evidence of aortic stenosis.  8. Aortic dilatation noted. There is mild dilatation of the ascending aorta measuring 44 mm.  9. The inferior vena cava is dilated in size with <50% respiratory variability, suggesting right atrial pressure of 15 mmHg. FINDINGS  Left Ventricle: Left ventricular ejection fraction, by estimation, is 30 to 35%. The left ventricle has moderate to severely decreased function. The left ventricle demonstrates regional wall motion abnormalities. Severe hypokinesis of the left ventricular, entire inferior wall, inferolateral wall and inferoseptal wall. The left ventricular internal cavity size was mildly dilated. There is no left ventricular hypertrophy. Left ventricular diastolic function could not be evaluated due to atrial fibrillation. Left ventricular diastolic function could not be evaluated. Right Ventricle: The right ventricular size is normal. No increase in right ventricular wall thickness. Right ventricular systolic function is mildly reduced. There is moderately elevated pulmonary artery systolic pressure. The tricuspid regurgitant velocity is 2.97 m/s, and with an assumed right atrial pressure of 15 mmHg, the estimated right ventricular systolic pressure is 36.1 mmHg. Left Atrium: Left atrial size was severely dilated. Right Atrium: Right atrial size was severely dilated. Pericardium: There is no evidence of pericardial effusion. Mitral Valve: The mitral valve is normal in structure. Mild mitral annular calcification. Mild to moderate mitral valve  regurgitation, with centrally-directed jet. Tricuspid Valve: The tricuspid valve is normal in structure. Tricuspid valve regurgitation is mild to moderate. Aortic Valve: The aortic valve is tricuspid. . There is moderate thickening and mild calcification of the aortic valve. Aortic valve regurgitation is trivial. Mild to moderate aortic valve sclerosis/calcification is present, without any evidence of aortic stenosis. There is moderate thickening of the aortic valve. There is mild calcification of the aortic valve. Pulmonic Valve: The pulmonic valve was grossly normal. Pulmonic valve regurgitation is trivial. Aorta: Aortic dilatation noted. There is mild dilatation of the ascending aorta measuring 44 mm. Venous: The inferior vena cava is dilated in size with less than 50% respiratory variability, suggesting right atrial pressure of 15 mmHg. IAS/Shunts: No atrial level shunt detected by color flow Doppler.  LEFT VENTRICLE PLAX 2D LVIDd:         6.30 cm LVIDs:         5.50 cm LV PW:  1.10 cm LV IVS:        1.00 cm LVOT diam:     2.30 cm LVOT Area:     4.15 cm  IVC IVC diam: 2.50 cm LEFT ATRIUM             Index       RIGHT ATRIUM           Index LA diam:        4.40 cm 2.31 cm/m  RA Area:     17.10 cm LA Vol (A2C):   87.2 ml 45.87 ml/m RA Volume:   42.80 ml  22.52 ml/m LA Vol (A4C):   68.2 ml 35.88 ml/m LA Biplane Vol: 76.4 ml 40.19 ml/m   AORTA Ao Root diam: 4.30 cm Ao Asc diam:  4.40 cm TRICUSPID VALVE TR Peak grad:   35.3 mmHg TR Vmax:        297.00 cm/s  SHUNTS Systemic Diam: 2.30 cm Mihai Croitoru MD Electronically signed by Sanda Klein MD Signature Date/Time: 11/04/2019/2:36:02 PM    Final         Scheduled Meds:  apixaban  2.5 mg Oral BID   atorvastatin  40 mg Oral BID   calcitRIOL  2.25 mcg Oral Q M,W,F   docusate sodium  100 mg Oral BID   famotidine  20 mg Oral Daily   finasteride  5 mg Oral Daily   melatonin  9 mg Oral QHS   mirtazapine  15 mg Oral QHS   senna  1  tablet Oral Daily   sodium chloride flush  3 mL Intravenous Q12H   terazosin  5 mg Oral QHS   Continuous Infusions:  sodium chloride 10 mL/hr at 11/03/19 0128   azithromycin (ZITHROMAX) 500 MG IVPB (Vial-Mate [employer]) 500 mg (11/06/19 0100)   cefTRIAXone (ROCEPHIN)  IV       LOS: 3 days    Time spent: 35 minutes    Eligah Anello A Jameka Ivie, MD [facility]   If 7PM-7AM, please contact night-coverage [email or web address]  11/06/2019, 1:58 PM

## 2019-11-07 DIAGNOSIS — I493 Ventricular premature depolarization: Secondary | ICD-10-CM

## 2019-11-07 DIAGNOSIS — J9611 Chronic respiratory failure with hypoxia: Secondary | ICD-10-CM

## 2019-11-07 LAB — BASIC METABOLIC PANEL
Anion gap: 7 (ref 5–15)
BUN: 29 mg/dL — ABNORMAL HIGH (ref 8–23)
CO2: 25 mmol/L (ref 22–32)
Calcium: 8.1 mg/dL — ABNORMAL LOW (ref 8.9–10.3)
Chloride: 112 mmol/L — ABNORMAL HIGH (ref 98–111)
Creatinine, Ser: 1.67 mg/dL — ABNORMAL HIGH (ref 0.61–1.24)
GFR calc Af Amer: 40 mL/min — ABNORMAL LOW (ref >60)
GFR calc non Af Amer: 34 mL/min — ABNORMAL LOW (ref >60)
Glucose, Bld: 114 mg/dL — ABNORMAL HIGH (ref 70–99)
Potassium: 3.7 mmol/L (ref 3.5–5.1)
Sodium: 144 mmol/L (ref 135–145)

## 2019-11-07 LAB — CULTURE, BLOOD (ROUTINE X 2)
Special Requests: ADEQUATE
Special Requests: ADEQUATE

## 2019-11-07 MED ORDER — METOPROLOL SUCCINATE ER 25 MG PO TB24
12.5000 mg | ORAL_TABLET | Freq: Every day | ORAL | Status: DC
  Administered 2019-11-07: 12.5 mg via ORAL
  Filled 2019-11-07: qty 1

## 2019-11-07 MED ORDER — AMOXICILLIN-POT CLAVULANATE 500-125 MG PO TABS: 500.0000 mg | ORAL_TABLET | Freq: Two times a day (BID) | ORAL | 0 refills | Status: AC

## 2019-11-07 MED ORDER — METOPROLOL SUCCINATE ER 25 MG PO TB24: 12.5000 mg | ORAL_TABLET | Freq: Every day | ORAL | 3 refills | Status: DC

## 2019-11-07 MED ORDER — AMOXICILLIN-POT CLAVULANATE 500-125 MG PO TABS
500.0000 mg | ORAL_TABLET | Freq: Two times a day (BID) | ORAL | Status: DC
  Administered 2019-11-07: 500 mg via ORAL
  Filled 2019-11-07 (×2): qty 1

## 2019-11-07 MED FILL — AMOX-CLAV 500-125 MG TABLET: 500-125 | 10 days supply | Qty: 20 | Fill #0

## 2019-11-07 MED FILL — METOPROLOL SUCCINATE ER 25: 25 | 60 days supply | Qty: 30 | Fill #0

## 2019-11-07 NOTE — Telephone Encounter (Signed)
Patient currently admitted

## 2019-11-07 NOTE — TOC Transition Note (Addendum)
Transition of Care [facility]) - CM/SW Discharge Note   Patient Details  Name: Isaah Furry MRN: [id number] Date of Birth: [birth year]  Transition of Care [facility] CM/SW Contact:  Zenon Mayo, RN Phone Number: 11/07/2019, 11:42 AM   Clinical Narrative:    Patient is for dc today, will need home oxygen per Dr. Tyrell Antonio,  NCM made referral to [facility] with [employer].  The oxygen will be delivered to room prior to dc and concentrator will be set up at his home. Patient is from home with his spouse and daughter, he has a [employer] fast streamer, hospital bed, electric w/chair at home.  Wife states they do not want SNF.   Wife states they will transport him by the special Lucianne Lei they have at discharge, but she will check with her daughter to make sure.  He has 24 hr care around the clock.  He has aide with [facility] for 8hr/day Mon thru Friday and then family support for the rest of the time.  She states they do not need any HH services at this time since he has an aide.    12:15- NCM received call from [facility], they need a chronic resp dx and ambulatory sats,  NCM informed  Charged RN of this information and the MD.   Final next level of care: Home/Self Care Barriers to Discharge: No Barriers Identified   Patient Goals and CMS Choice Patient states their goals for this hospitalization and ongoing recovery are:: get better   Choice offered to / list presented to : NA  Discharge Placement                       Discharge Plan and Services In-house Referral: NA Discharge Planning Services: CM Consult Post Acute Care Choice: Home Health          DME Arranged: Oxygen DME Agency: [employer] Date DME Agency Contacted: 11/07/19 Time DME Agency Contacted: 1142 Representative spoke with at DME Agency: Thedore Mins HH Arranged: NA          Social Determinants of Health ([place]) Interventions     Readmission Risk Interventions No flowsheet data found.

## 2019-11-07 NOTE — Progress Notes (Signed)
Patient desats to 88s when asleep. Upon awakening the patient, the O2 sat goes back to 90s. 2L O2 via [place] was put on at night. The O2 sat kept dropping at times to 70s when the patient in deep sleep.   Patient's HR dropped to 38 for a second as per CCMD . This RN assessed the patient, patient asymptomatic. Vitals recorded in epic showed HR between 45s-75s within a minute. HRs recorded.    Will continue to monitor the patient.

## 2019-11-07 NOTE — Plan of Care (Signed)
  Problem: Clinical Measurements: Goal: Respiratory complications will improve Outcome: Progressing Goal: Cardiovascular complication will be avoided Outcome: Progressing   Problem: Coping: Goal: Level of anxiety will decrease Outcome: Progressing   Problem: Elimination: Goal: Will not experience complications related to bowel motility Outcome: Progressing   Problem: Pain Managment: Goal: General experience of comfort will improve Outcome: Progressing   Problem: Safety: Goal: Ability to remain free from injury will improve Outcome: Progressing   Problem: Skin Integrity: Goal: Risk for impaired skin integrity will decrease Outcome: Progressing   

## 2019-11-07 NOTE — Progress Notes (Signed)
SATURATION QUALIFICATIONS: (This note is used to comply with regulatory documentation for home oxygen)  Patient Saturations on Room Air at Rest = 88%  Patient Saturations on Room Air while Ambulating = %  Patient Saturations on  Liters of oxygen while Ambulating = %  Please briefly explain why patient needs home oxygen: 

## 2019-11-07 NOTE — Progress Notes (Signed)
Progress Note  Patient Name: Isabel Freese Date of Encounter: 11/07/2019  [facility] Cardiologist: Buford Dresser, MD new  Subjective   Sleeping comfortably in bed, awakens to voice. Wife at bedside, says he had a good night. Awaiting plan for antibiotics and hopeful for discharge in the near future.  Inpatient Medications    Scheduled Meds:  apixaban  2.5 mg Oral BID   atorvastatin  40 mg Oral BID   calcitRIOL  2.25 mcg Oral Q M,W,F   docusate sodium  100 mg Oral BID   famotidine  20 mg Oral Daily   finasteride  5 mg Oral Daily   melatonin  9 mg Oral QHS   mirtazapine  15 mg Oral QHS   senna  1 tablet Oral Daily   sodium chloride flush  3 mL Intravenous Q12H   terazosin  5 mg Oral QHS   Continuous Infusions:  sodium chloride 10 mL/hr at 11/03/19 0128   azithromycin (ZITHROMAX) 500 MG IVPB (Vial-Mate [employer]or) 250 mL/hr at 11/07/19 0345   cefTRIAXone (ROCEPHIN)  IV 200 mL/hr at 11/07/19 0400   vancomycin     PRN Meds: sodium chloride, acetaminophen **OR** acetaminophen, bisacodyl, hydrALAZINE, HYDROcodone-acetaminophen, morphine injection, ondansetron **OR** ondansetron (ZOFRAN) IV, polyethylene glycol   Vital Signs    Vitals:   11/07/19 0544 11/07/19 0545 11/07/19 0548 11/07/19 0728  BP:    131/87  Pulse: (!) 48 (!) 54 70 78  Resp:    17  Temp:    (!) 97.5 F (36.4 C)  TempSrc:    Oral  SpO2:    100%  Weight:      Height:        Intake/Output Summary (Last 24 hours) at 11/07/2019 1026 Last data filed at 11/07/2019 0400 Gross per 24 hour  Intake 842.86 ml  Output 675 ml  Net 167.86 ml   Last 3 Weights 11/03/2019 11/02/2019 09/09/2019  Weight (lbs) 164 lb 6.4 oz 158 lb 4.6 oz 176 lb  Weight (kg) 74.571 kg 71.8 kg 79.833 kg      Telemetry    Atrial fibrillation, rate controlled, with frequent PVCs - Personally Reviewed  ECG    No new since 6/28 - Personally Reviewed  Physical Exam   GEN: Frail appearing, resting comfortably in  bed Neck: No JVD appreciated Cardiac: irregularly irregular S1 and S2 with 1/6 systolic murmur Respiratory: Clear to auscultation bilaterally. GI: Soft, nontender, non-distended  MS: Trivial edema, chronic venous stasis changes Neuro:  R facial droop, R sided weakness, dysarthria  Labs    High Sensitivity Troponin:   Recent Labs  Lab 11/03/19 0643  TROPONINIHS 64*      Chemistry Recent Labs  Lab 11/03/19 0643 11/04/19 0240 11/05/19 0303 11/06/19 0318 11/07/19 0220  NA 144   < > 144 142 144  K 4.0   < > 3.6 3.6 3.7  CL 112*   < > 111 112* 112*  CO2 24   < > 24 21* 25  GLUCOSE 159*   < > 119* 127* 114*  BUN 49*   < > 40* 33* 29*  CREATININE 2.08*   < > 1.92* 1.75* 1.67*  CALCIUM 8.1*   < > 8.4* 8.4* 8.1*  PROT 5.6*  --  5.6* 5.6*  --   ALBUMIN 2.2*  --  2.3* 2.3*  --   AST 25  --  20 24  --   ALT 19  --  18 19  --   ALKPHOS 76  --  68 66  --   BILITOT 0.8  --  0.7 0.7  --   GFRNONAA 26*   < > 29* 32* 34*  GFRAA 30*   < > 34* 38* 40*  ANIONGAP 8   < > 9 9 7    < > = values in this interval not displayed.     Hematology Recent Labs  Lab 11/04/19 0240 11/05/19 0303 11/06/19 0318  WBC 5.9 4.6 4.3  RBC 3.27* 3.30* 3.44*  HGB 9.8* 9.8* 10.5*  HCT 32.4* 32.5* 34.0*  MCV 99.1 98.5 98.8  MCH 30.0 29.7 30.5  MCHC 30.2 30.2 30.9  RDW 15.8* 15.3 14.9  PLT 84* 89* 80*    BNPNo results for input(s): BNP, PROBNP in the last 168 hours.   DDimer No results for input(s): DDIMER in the last 168 hours.   Radiology    No results found.  Cardiac Studies   Echo 11/04/19 1. Left ventricular ejection fraction, by estimation, is 30 to 35%. The  left ventricle has moderate to severely decreased function. The left  ventricle demonstrates regional wall motion abnormalities (see scoring  diagram/findings for description). The  left ventricular internal cavity size was mildly dilated. Left ventricular  diastolic function could not be evaluated. There is severe hypokinesis  of  the left ventricular, entire inferior wall, inferolateral wall and  inferoseptal wall.  2. Right ventricular systolic function is mildly reduced. The right  ventricular size is normal. There is moderately elevated pulmonary artery  systolic pressure.  3. Left atrial size was severely dilated.  4. Right atrial size was severely dilated.  5. The mitral valve is normal in structure. Mild to moderate mitral valve  regurgitation.  6. Tricuspid valve regurgitation is mild to moderate.  7. The aortic valve is tricuspid. Aortic valve regurgitation is trivial.  Mild to moderate aortic valve sclerosis/calcification is present, without  any evidence of aortic stenosis.  8. Aortic dilatation noted. There is mild dilatation of the ascending  aorta measuring 44 mm.  9. The inferior vena cava is dilated in size with <50% respiratory  variability, suggesting right atrial pressure of 15 mmHg.   Patient Profile     86 y.o. 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 Tyrell Antonio.  Assessment & Plan    Acute combined systolic and diastolic heart failure (suspect chronic component, but no documentation available):  -Echo performed this admission revealed EF 30-35% with inferior/inferolateral/inferoseptal wall hypokinesis, mildly reduced RV systolic function, severe biatrial enlargement, and mild-moderate MR/TR. -suspect ischemic cardiomyopathy given his history.  -no plans for cath given comorbidities, family and patient preferences -medical management is limited; no ACEi/ARB/ARNI due to CKD, unable to add hydral/imdur due to intermittently soft blood pressures.  -telemetry reviewed today. Has heart rates 50s-90s with significant ectopy. Will trial very small dose of metoprolol  succinate to see if this improves his ectopy -family very involved, especially daughter, will monitor vital signs closely  CAD s/p remote PCI, hyperlipidema:  -no aspirin as he is on anticoagulation -asymptomatic -Continue statin, LDL at goal at 54  Persistent atrial fibrillation: had been rate controlled on no agents prior to admission -cautiously adding very low dose metoprolol succinate, as above -Continue eliquis 2.5mg  BID (dose adjusted for age/renal function)  CKD stage  4: Cr 1.67, down from 2.3 on admission - Continue to avoid nephrotoxic agents  Per primary team: Urosepsis Possible PNA CVA: s/p left MCA stroke 06/2019 with residual right sided hemiparesis and dysarthia.   [facility] will sign off.   Medication Recommendations:  Monitor blood pressure and heart rate, continue metoprolol succinate 12.5 mg daily unless consistently bradycardic or hypotensive Other recommendations (labs, testing, etc):  none Follow up as an outpatient:  Has appt with Coletta Memos on 11/25/19 at the [facility] office.  For questions or updates, please contact [place] Please consult [email or web address] for contact info under        Signed, Buford Dresser, MD  11/07/2019, 10:26 AM

## 2019-11-07 NOTE — Discharge Summary (Addendum)
Physician Discharge Summary  Robert Mcconnell XIP:[id number] DOB: Sep 26, 1921 DOA: 11/02/2019  PCP: [name], MD  Admit date: 11/02/2019 Discharge date: 11/07/2019  Admitted From: Home Disposition: Home   Recommendations for Outpatient Follow-up:  1. Follow up with PCP in 1-2 weeks 2. Please obtain BMP/CBC in one week 3. Follow up with PCP for resolution of Infection.  4. Follow up with cardiology for management of Heart failure.  5. Evaluation for depression. 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  Patient reporter small amount of blood coming out from his penis after urination 3 days ago.  Patient also developed chills, high fever and vomiting.  He was not able to take Tylenol.  Temperature as high as 102.  He does have CKD and he will not pursued hemodialysis.  He has a past medical history of A. fib as well.  Recently moved from [place] to live with his daughter.  He reporter  Dysuria.  He was hypotensive on admission, UA consistent with UTI and chest x-ray with possible pneumonia.    1-Severe sepsis, secondary to UTI Klebsiella pneumoniae lactobacillus growing in urine versus CAP; Gram Positive Rods Bacteremia;  -Patient presented with fever temperature more than 102, lactic acidosis, hypotension.  Source of infection UA versus pneumonia -He received IV fluids. -Ultrasound was negative for nephrolithiasis -Continue with ceftriaxone and azithromycin. -NSL fluids.  -Sepsis parameter improving, trending down. Day 5 antibiotics.  -Blood cultures now growing lactobacillus. Plan to discharge on Augmentin (10 days)  that will cover both lactobacillus and Klebsiella.  -stable for discharge    2-Dysphagia: Patient with dysphagia since his CVA. Continue with dysphagia 2 diet Aspiration precaution  3-History of CVA: Marked deficit with right hemiparesis, right facial droop, dysphagia and dysarthria Continue with Remeron Resume Eliquis  New cardiomyopathy, Ef 35 %; Wife was not aware of prior history of heart failure Cardiology consulted. Conservative management.  Appreciate cardiology evaluation.  Cardiology recommended low dose metoprolol to help with ectopy.   Acute on chronic stage IV CKD: Creatinine baseline 2.3 Received IV fluids. Hold IV fluids due to heart failure, cardiomyopathy Creatinine trending down today of 1.65  CAD/A. fib chronic Continue with Eliquis Continue Lipitor  BPH: Continue with finasteride and Terazosin Chronic Hypoxic respiratory failure. Patient desat at night and during day while sleep if he is not using oxygen. Will arrange home oxygen. Needs further evaluation for sleep study. He will benefit from continues oxygen.   Pressure injury, sacrum; stage 2, POA; local care  Pressure Injury 11/03/19 Sacrum Posterior Stage 2 -  Partial thickness loss of dermis presenting as a shallow open injury with a red, pink wound bed without slough. (Active)  11/03/19 1100  Location: Sacrum  Location Orientation: Posterior  Staging: Stage 2 -  Partial thickness loss of dermis presenting as a shallow open injury with a red, pink wound bed without slough.  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 (CVA)  Acute renal failure superimposed on stage 4 chronic kidney disease (HCC)   Right hemiparesis (HCC)   Atrial fibrillation, chronic (HCC)   Severe  sepsis ([place])   CAP (community acquired pneumonia)   Dysphagia   Pressure injury of skin   Chronic respiratory failure with hypoxia [facility])    Discharge Instructions  Discharge Instructions    Call MD for:  temperature >100.4   Complete by: As directed    Diet - low sodium heart healthy   Complete by: As directed    Increase activity slowly   Complete by: As directed    No wound care   Complete by: As directed      Allergies as of 11/07/2019   No Known Allergies     Medication List    TAKE these medications   amoxicillin-clavulanate 500-125 MG tablet Commonly known as: AUGMENTIN Take 1 tablet (500 mg total) by mouth 2 (two) times daily for 10 days.   atorvastatin 40 MG tablet Commonly known as: LIPITOR Take 40 mg by mouth at bedtime.   calcitRIOL 1 MCG/ML solution Commonly known as: ROCALTROL Take 0.25 mcg by mouth every Monday, Wednesday, and Friday.   Docusate Sodium 150 MG/15ML syrup Take 150 mg by mouth daily.   Eliquis 2.5 MG Tabs tablet Generic drug: apixaban Take 2.5 mg by mouth 2 (two) times daily.   famotidine 20 MG tablet Commonly known as: PEPCID Take 20 mg by mouth daily.   finasteride 5 MG tablet Commonly known as: PROSCAR Take 5 mg by mouth daily.   melatonin 3 MG Tabs tablet Take 9 mg by mouth at bedtime.   metoprolol succinate 25 MG 24 hr tablet Commonly known as: TOPROL-XL Take 0.5 tablets (12.5 mg total) by mouth daily.   mirtazapine 15 MG tablet Commonly known as: REMERON Take 15 mg by mouth at bedtime.   senna 8.6 MG Tabs tablet Commonly known as: SENOKOT Take 1 tablet by mouth daily.   terazosin 5 MG capsule Commonly known as: HYTRIN Take 5 mg by mouth at bedtime.   ZINC 15 PO Take 15 mg by mouth 2 (two) times daily.            Durable Medical Equipment  (From admission, onward)         Start     Ordered   11/07/19 0745  For home use only DME oxygen  Once       Question Answer Comment  Length of Need 6 Months    Mode or (Route) Nasal cannula   Liters per Minute 3   Frequency Continuous (stationary and portable oxygen unit needed)   Oxygen delivery system Gas      11/07/19 0745          Follow-up Information    [facility] Follow up.   Why: oxygen Contact information: [place] [address] [phone]              No Known Allergies  Consultations: Cardiology   Procedures/Studies: CT HEAD WO CONTRAST  Result Date: 11/03/2019 CLINICAL DATA:  Fever and UTI.  Encephalopathy EXAM: CT HEAD WITHOUT CONTRAST TECHNIQUE: Contiguous axial images were obtained from the base of the skull through the vertex without intravenous contrast. COMPARISON:  None. FINDINGS: Brain: Remote left MCA branch infarct affecting the frontal operculum and insula. Remote perforator infarct at the left basal ganglia and corona radiata. Mild for age chronic small vessel ischemia. No evidence of acute infarct, hemorrhage, hydrocephalus, or mass.  Vascular: No hyperdense vessel or unexpected calcification. Skull: Normal. Negative for fracture or focal lesion. Sinuses/Orbits: No acute finding. IMPRESSION: 1. No acute or reversible finding. 2. Remote left MCA branch infarcts. Electronically Signed   By: Monte Fantasia M.D.   On: 11/03/2019 06:34   US RENAL  Result Date: 11/03/2019 CLINICAL DATA:  Sepsis. EXAM: RENAL / URINARY TRACT ULTRASOUND COMPLETE COMPARISON:  None. FINDINGS: Right Kidney: Renal measurements: 10.6 x 5.0 x 4.1 cm = volume: 112.9 mL. Renal cortical thinning and increased echogenicity suggesting medical renal disease. Simple appearing 7 cm cyst projecting off the upper pole region of the right kidney. Septated cyst measuring 4.5 x 4.5 x 4.8 cm projecting off the inferior and medial aspect of the right kidney. No hydronephrosis. Left Kidney: Renal measurements: 9.1 x 4.6 x 4.0 cm = volume: 86.4 mL. Renal cortical thinning and increased echogenicity suggesting medical renal  disease. 1.3 cm simple appearing cyst. No hydronephrosis. Bladder: The bladder demonstrates mild trabeculation and may be due to mild bladder outlet obstruction from a slightly enlarged prostate gland. No bladder mass or calculi. Other: None. IMPRESSION: 1. Bilateral renal cysts. 2. Renal cortical thinning and increased echogenicity suggesting medical renal disease. 3. Mild prostate gland enlargement and mild trabeculation of the bladder. Electronically Signed   By: Marijo Sanes M.D.   On: 11/03/2019 10:33   DG Chest Port 1 View  Result Date: 11/02/2019 CLINICAL DATA:  Fever. EXAM: PORTABLE CHEST 1 VIEW COMPARISON:  None. FINDINGS: Right chest port with tip in the SVC. Cardiomegaly with aortic atherosclerosis and tortuosity. Probable calcified mediastinal lymph node. Mild vascular congestion. Patchy bibasilar opacities, right greater than left. Small pleural effusions suspected. No pneumothorax. No acute osseous abnormalities are seen. IMPRESSION: 1. Cardiomegaly with vascular congestion. Probable small pleural effusions. 2. Patchy bibasilar opacities, right greater than left, may represent pneumonia in the setting of fever, alternatively atelectasis or asymmetric pulmonary edema is also considered. Aortic Atherosclerosis (ICD10-I70.0). Electronically Signed   By: Keith Rake M.D.   On: 11/02/2019 23:56   ECHOCARDIOGRAM COMPLETE  Result Date: 11/04/2019    ECHOCARDIOGRAM REPORT   Patient Name:   ABDULRAHEEM PINEO Date of Exam: 11/04/2019 Medical Rec #:  [id number]      Height:       69.0 in Accession #:    [id number]     Weight:       164.4 lb Date of Birth:  Apr 03, 1922      BSA:          1.901 m Patient Age:    86 years       BP:           131/90 mmHg Patient Gender: M              HR:           68 bpm. Exam Location:  Inpatient Procedure: 2D Echo Indications:    stroke 434.91  History:        Patient has no prior history of Echocardiogram examinations.                 CAD; chronic kidney disease.   Sonographer:    Johny Chess Referring Phys: [id number] [place]  1. Left ventricular ejection fraction, by estimation, is 30 to 35%. The left ventricle has moderate to severely decreased function. The left ventricle demonstrates regional wall motion abnormalities (see scoring diagram/findings for description). The left ventricular internal cavity size was mildly dilated. Left ventricular diastolic function  could not be evaluated. There is severe hypokinesis of the left ventricular, entire inferior wall, inferolateral wall and inferoseptal wall.  2. Right ventricular systolic function is mildly reduced. The right ventricular size is normal. There is moderately elevated pulmonary artery systolic pressure.  3. Left atrial size was severely dilated.  4. Right atrial size was severely dilated.  5. The mitral valve is normal in structure. Mild to moderate mitral valve regurgitation.  6. Tricuspid valve regurgitation is mild to moderate.  7. The aortic valve is tricuspid. Aortic valve regurgitation is trivial. Mild to moderate aortic valve sclerosis/calcification is present, without any evidence of aortic stenosis.  8. Aortic dilatation noted. There is mild dilatation of the ascending aorta measuring 44 mm.  9. The inferior vena cava is dilated in size with <50% respiratory variability, suggesting right atrial pressure of 15 mmHg. FINDINGS  Left Ventricle: Left ventricular ejection fraction, by estimation, is 30 to 35%. The left ventricle has moderate to severely decreased function. The left ventricle demonstrates regional wall motion abnormalities. Severe hypokinesis of the left ventricular, entire inferior wall, inferolateral wall and inferoseptal wall. The left ventricular internal cavity size was mildly dilated. There is no left ventricular hypertrophy. Left ventricular diastolic function could not be evaluated due to atrial fibrillation. Left ventricular diastolic function could not be evaluated.  Right Ventricle: The right ventricular size is normal. No increase in right ventricular wall thickness. Right ventricular systolic function is mildly reduced. There is moderately elevated pulmonary artery systolic pressure. The tricuspid regurgitant velocity is 2.97 m/s, and with an assumed right atrial pressure of 15 mmHg, the estimated right ventricular systolic pressure is 68.0 mmHg. Left Atrium: Left atrial size was severely dilated. Right Atrium: Right atrial size was severely dilated. Pericardium: There is no evidence of pericardial effusion. Mitral Valve: The mitral valve is normal in structure. Mild mitral annular calcification. Mild to moderate mitral valve regurgitation, with centrally-directed jet. Tricuspid Valve: The tricuspid valve is normal in structure. Tricuspid valve regurgitation is mild to moderate. Aortic Valve: The aortic valve is tricuspid. . There is moderate thickening and mild calcification of the aortic valve. Aortic valve regurgitation is trivial. Mild to moderate aortic valve sclerosis/calcification is present, without any evidence of aortic stenosis. There is moderate thickening of the aortic valve. There is mild calcification of the aortic valve. Pulmonic Valve: The pulmonic valve was grossly normal. Pulmonic valve regurgitation is trivial. Aorta: Aortic dilatation noted. There is mild dilatation of the ascending aorta measuring 44 mm. Venous: The inferior vena cava is dilated in size with less than 50% respiratory variability, suggesting right atrial pressure of 15 mmHg. IAS/Shunts: No atrial level shunt detected by color flow Doppler.  LEFT VENTRICLE PLAX 2D LVIDd:         6.30 cm LVIDs:         5.50 cm LV PW:         1.10 cm LV IVS:        1.00 cm LVOT diam:     2.30 cm LVOT Area:     4.15 cm  IVC IVC diam: 2.50 cm LEFT ATRIUM             Index       RIGHT ATRIUM           Index LA diam:        4.40 cm 2.31 cm/m  RA Area:     17.10 cm LA Vol (A2C):   87.2 ml 45.87 ml/m RA  Volume:  42.80 ml  22.52 ml/m LA Vol (A4C):   68.2 ml 35.88 ml/m LA Biplane Vol: 76.4 ml 40.19 ml/m   AORTA Ao Root diam: 4.30 cm Ao Asc diam:  4.40 cm TRICUSPID VALVE TR Peak grad:   35.3 mmHg TR Vmax:        297.00 cm/s  SHUNTS Systemic Diam: 2.30 cm Dani Gobble Croitoru MD Electronically signed by Sanda Klein MD Signature Date/Time: 11/04/2019/2:36:02 PM    Final      Subjective: Alert, he gets frustrated ( he crys at time ) because he is not able to express himself.  Denies pain.   Discharge Exam: Vitals:   11/07/19 0728 11/07/19 1116  BP: 131/87 128/79  Pulse: 78 73  Resp: 17 18  Temp: (!) 97.5 F (36.4 C) 97.6 F (36.4 C)  SpO2: 100% 100%     General: Pt is alert, awake, not in acute distress Cardiovascular: RRR, S1/S2 +, no rubs, no gallops Respiratory: CTA bilaterally, no wheezing, no rhonchi Abdominal: Soft, NT, ND, bowel sounds + Extremities: no edema, no cyanosis    The results of significant diagnostics from this hospitalization (including imaging, microbiology, ancillary and laboratory) are listed below for reference.     Microbiology: Recent Results (from the past 240 hour(s))  Blood Culture (routine x 2)     Status: Abnormal   Collection Time: 11/02/19 11:15 PM   Specimen: BLOOD LEFT ARM  Result Value Ref Range Status   Specimen Description   Final    BLOOD LEFT ARM Performed at [facility], [address] [place], [place] [address]    Special Requests   Final    BOTTLES DRAWN AEROBIC AND ANAEROBIC Blood Culture adequate volume Performed at [facility], [address] [place], [place] [address]    Culture  Setup Time   Final    AEROBIC BOTTLE ONLY GRAM POSITIVE RODS CRITICAL VALUE NOTED.  VALUE IS CONSISTENT WITH PREVIOUSLY REPORTED AND CALLED VALUE.    Culture (A)  Final    LACTOBACILLUS SPECIES Standardized susceptibility testing for this organism is not available. Performed at [facility], [place] [address]  [place], [place] [address]    Report Status 11/07/2019 FINAL  Final  Blood Culture (routine x 2)     Status: Abnormal   Collection Time: 11/02/19 11:20 PM   Specimen: BLOOD RIGHT FOREARM  Result Value Ref Range Status   Specimen Description   Final    BLOOD RIGHT FOREARM Performed at [facility], [address] [place], [place] [address]    Special Requests   Final    BOTTLES DRAWN AEROBIC AND ANAEROBIC Blood Culture adequate volume Performed at [facility], [address] [place], [place] [address]    Culture  Setup Time   Final    GRAM POSITIVE RODS ANAEROBIC BOTTLE ONLY CRITICAL RESULT CALLED TO, READ BACK BY AND VERIFIED WITH: L. SEAY PHARMD, AT 4401 11/05/19 BY D. VANHOOK    Culture (A)  Final    LACTOBACILLUS SPECIES Standardized susceptibility testing for this organism is not available. Performed at [facility], [place] [address], [place], [place] [address]    Report Status 11/07/2019 FINAL  Final  Urine culture     Status: Abnormal   Collection Time: 11/03/19 12:41 AM   Specimen: In/Out Cath Urine  Result Value Ref Range Status   Specimen Description   Final    IN/OUT CATH URINE Performed  at [facility], [address] [place], [place] [address]    Special Requests   Final    NONE Performed at [facility], [address] [place], [place] [address]    Culture (A)  Final    >=100,000 COLONIES/mL KLEBSIELLA PNEUMONIAE >=100,000 COLONIES/mL LACTOBACILLUS SPECIES Standardized susceptibility testing for this organism is not available. Performed at [facility], [place] [address], [place], [place] [address]    Report Status 11/05/2019 FINAL  Final   Organism ID, Bacteria KLEBSIELLA PNEUMONIAE (A)  Final      Susceptibility   Klebsiella pneumoniae - MIC*    AMPICILLIN RESISTANT Resistant     CEFAZOLIN <=4 SENSITIVE Sensitive     CEFTRIAXONE <=0.25 SENSITIVE Sensitive     CIPROFLOXACIN <=0.25 SENSITIVE Sensitive      GENTAMICIN <=1 SENSITIVE Sensitive     IMIPENEM <=0.25 SENSITIVE Sensitive     NITROFURANTOIN 32 SENSITIVE Sensitive     TRIMETH/SULFA <=20 SENSITIVE Sensitive     AMPICILLIN/SULBACTAM <=2 SENSITIVE Sensitive     PIP/TAZO <=4 SENSITIVE Sensitive     * >=100,000 COLONIES/mL KLEBSIELLA PNEUMONIAE  SARS Coronavirus 2 by RT PCR (hospital order, performed in [place] hospital lab) Nasopharyngeal Nasopharyngeal Swab     Status: None   Collection Time: 11/03/19 12:41 AM   Specimen: Nasopharyngeal Swab  Result Value Ref Range Status   SARS Coronavirus 2 NEGATIVE NEGATIVE Final    Comment: (NOTE) SARS-CoV-2 target nucleic acids are NOT DETECTED.  The SARS-CoV-2 RNA is generally detectable in upper and lower respiratory specimens during the acute phase of infection. The lowest concentration of SARS-CoV-2 viral copies this assay can detect is 250 copies / mL. A negative result does not preclude SARS-CoV-2 infection and should not be used as the sole basis for treatment or other patient management decisions.  A negative result may occur with improper specimen collection / handling, submission of specimen other than nasopharyngeal swab, presence of viral mutation(s) within the areas targeted by this assay, and inadequate number of viral copies (<250 copies / mL). A negative result must be combined with clinical observations, patient history, and epidemiological information.  Fact Sheet for Patients:   StrictlyIdeas.no  Fact Sheet for Healthcare Providers: BankingDealers.co.za  This test is not yet approved or  cleared by the [place] FDA and has been authorized for detection and/or diagnosis of SARS-CoV-2 by FDA under an Emergency Use Authorization (EUA).  This EUA will remain in effect (meaning this test can be used) for the duration of the COVID-19 declaration under Section 564(b)(1) of the Act, 21 U.S.C. section 360bbb-3(b)(1), unless  the authorization is terminated or revoked sooner.  Performed at [facility], [address] [place], [place] [address]   MRSA PCR Screening     Status: None   Collection Time: 11/03/19  5:25 AM   Specimen: Nasopharyngeal  Result Value Ref Range Status   MRSA by PCR NEGATIVE NEGATIVE Final    Comment:        The GeneXpert MRSA Assay (FDA approved for NASAL specimens only), is one component of a comprehensive MRSA colonization surveillance program. It is not intended to diagnose MRSA infection nor to guide or monitor treatment for MRSA infections. Performed at [facility], [place] [address] [place], [place] [address]      Labs: BNP (last 3 results) No results for input(s): BNP in the last 8760 hours. Basic Metabolic Panel: Recent Labs  Lab  11/03/19 1779 11/04/19 0240 11/05/19 0303 11/06/19 0318 11/07/19 0220  NA 144 143 144 142 144  K 4.0 3.7 3.6 3.6 3.7  CL 112* 111 111 112* 112*  CO2 24 22 24  21* 25  GLUCOSE 159* 106* 119* 127* 114*  BUN 49* 49* 40* 33* 29*  CREATININE 2.08* 2.09* 1.92* 1.75* 1.67*  CALCIUM 8.1* 8.0* 8.4* 8.4* 8.1*  MG  --   --  2.0 1.9  --   PHOS  --   --  4.0 4.1  --    Liver Function Tests: Recent Labs  Lab 11/02/19 2315 11/03/19 0643 11/05/19 0303 11/06/19 0318  AST 25 25 20 24   ALT 18 19 18 19   ALKPHOS 88 76 68 66  BILITOT 1.0 0.8 0.7 0.7  PROT 6.4* 5.6* 5.6* 5.6*  ALBUMIN 2.7* 2.2* 2.3* 2.3*   No results for input(s): LIPASE, AMYLASE in the last 168 hours. No results for input(s): AMMONIA in the last 168 hours. CBC: Recent Labs  Lab 11/02/19 2315 11/03/19 0643 11/04/19 0240 11/05/19 0303 11/06/19 0318  WBC 9.1 10.5 5.9 4.6 4.3  NEUTROABS 8.2* 9.0*  --  2.8 2.2  HGB 11.5* 9.8* 9.8* 9.8* 10.5*  HCT 37.9* 32.2* 32.4* 32.5* 34.0*  MCV 99.5 99.7 99.1 98.5 98.8  PLT 99* 90* 84* 89* 80*   Cardiac Enzymes: No results for input(s): CKTOTAL, CKMB, CKMBINDEX, TROPONINI in the last 168 hours. BNP: Invalid  input(s): POCBNP CBG: No results for input(s): GLUCAP in the last 168 hours. D-Dimer No results for input(s): DDIMER in the last 72 hours. Hgb A1c No results for input(s): HGBA1C in the last 72 hours. Lipid Profile Recent Labs    11/05/19 0303  CHOL 98  HDL 28*  LDLCALC 54  TRIG 79  CHOLHDL 3.5   Thyroid function studies No results for input(s): TSH, T4TOTAL, T3FREE, THYROIDAB in the last 72 hours.  Invalid input(s): FREET3 Anemia work up No results for input(s): VITAMINB12, FOLATE, FERRITIN, TIBC, IRON, RETICCTPCT in the last 72 hours. Urinalysis    Component Value Date/Time   COLORURINE YELLOW 11/03/2019 0041   APPEARANCEUR TURBID (A) 11/03/2019 0041   LABSPEC 1.025 11/03/2019 0041   PHURINE 6.0 11/03/2019 0041   GLUCOSEU NEGATIVE 11/03/2019 0041   HGBUR SMALL (A) 11/03/2019 0041   BILIRUBINUR NEGATIVE 11/03/2019 0041   KETONESUR NEGATIVE 11/03/2019 0041   PROTEINUR 30 (A) 11/03/2019 0041   NITRITE NEGATIVE 11/03/2019 0041   LEUKOCYTESUR MODERATE (A) 11/03/2019 0041   Sepsis Labs Invalid input(s): PROCALCITONIN,  WBC,  LACTICIDVEN Microbiology Recent Results (from the past 240 hour(s))  Blood Culture (routine x 2)     Status: Abnormal   Collection Time: 11/02/19 11:15 PM   Specimen: BLOOD LEFT ARM  Result Value Ref Range Status   Specimen Description   Final    BLOOD LEFT ARM Performed at [facility], [address] [place], [place] [address]    Special Requests   Final    BOTTLES DRAWN AEROBIC AND ANAEROBIC Blood Culture adequate volume Performed at [facility], [address] [place], [place] [address]    Culture  Setup Time   Final    AEROBIC BOTTLE ONLY GRAM POSITIVE RODS CRITICAL VALUE NOTED.  VALUE IS CONSISTENT WITH PREVIOUSLY REPORTED AND CALLED VALUE.    Culture (A)  Final    LACTOBACILLUS SPECIES Standardized susceptibility testing for this organism is not available. Performed at [facility], [place],  [place] [address]    Report Status 11/07/2019 FINAL  Final  Blood Culture (routine x 2)     Status: Abnormal   Collection Time: 11/02/19 11:20 PM   Specimen: BLOOD RIGHT FOREARM  Result Value Ref Range Status   Specimen Description   Final    BLOOD RIGHT FOREARM Performed at [facility], [address] [place], [place] [address]    Special Requests   Final    BOTTLES DRAWN AEROBIC AND ANAEROBIC Blood Culture adequate volume Performed at [facility], [address] [place], [place] [address]    Culture  Setup Time   Final    GRAM POSITIVE RODS ANAEROBIC BOTTLE ONLY CRITICAL RESULT CALLED TO, READ BACK BY AND VERIFIED WITH: L. SEAY PHARMD, AT 2130 11/05/19 BY D. VANHOOK    Culture (A)  Final    LACTOBACILLUS SPECIES Standardized susceptibility testing for this organism is not available. Performed at [facility], [place] [address] [place], [place] [address]    Report Status 11/07/2019 FINAL  Final  Urine culture     Status: Abnormal   Collection Time: 11/03/19 12:41 AM   Specimen: In/Out Cath Urine  Result Value Ref Range Status   Specimen Description   Final    IN/OUT CATH URINE Performed at [facility], [address] [place], [place] [address]    Special Requests   Final    NONE Performed at [facility] [address] [place], [place] [address]    Culture (A)  Final    >=100,000 COLONIES/mL KLEBSIELLA PNEUMONIAE >=100,000 COLONIES/mL LACTOBACILLUS SPECIES Standardized susceptibility testing for this organism is not available. Performed at [facility], [place] [address], [place], [place] [address]    Report Status 11/05/2019 FINAL  Final   Organism ID, Bacteria KLEBSIELLA PNEUMONIAE (A)  Final      Susceptibility   Klebsiella pneumoniae - MIC*    AMPICILLIN RESISTANT Resistant     CEFAZOLIN <=4 SENSITIVE Sensitive     CEFTRIAXONE <=0.25 SENSITIVE Sensitive     CIPROFLOXACIN <=0.25 SENSITIVE  Sensitive     GENTAMICIN <=1 SENSITIVE Sensitive     IMIPENEM <=0.25 SENSITIVE Sensitive     NITROFURANTOIN 32 SENSITIVE Sensitive     TRIMETH/SULFA <=20 SENSITIVE Sensitive     AMPICILLIN/SULBACTAM <=2 SENSITIVE Sensitive     PIP/TAZO <=4 SENSITIVE Sensitive     * >=100,000 COLONIES/mL KLEBSIELLA PNEUMONIAE  SARS Coronavirus 2 by RT PCR (hospital order, performed in [place] hospital lab) Nasopharyngeal Nasopharyngeal Swab     Status: None   Collection Time: 11/03/19 12:41 AM   Specimen: Nasopharyngeal Swab  Result Value Ref Range Status   SARS Coronavirus 2 NEGATIVE NEGATIVE Final    Comment: (NOTE) SARS-CoV-2 target nucleic acids are NOT DETECTED.  The SARS-CoV-2 RNA is generally detectable in upper and lower respiratory specimens during the acute phase of infection. The lowest concentration of SARS-CoV-2 viral copies this assay can detect is 250 copies / mL. A negative result does not preclude SARS-CoV-2 infection and should not be used as the sole basis for treatment or other patient management decisions.  A negative result may occur with improper specimen collection / handling, submission of specimen other than nasopharyngeal swab, presence of viral mutation(s) within the areas targeted by this assay, and inadequate number of viral copies (<250 copies / mL). A negative result must be combined with clinical observations, patient history, and epidemiological information.  Fact Sheet for Patients:   StrictlyIdeas.no  Fact Sheet for Healthcare Providers: BankingDealers.co.za  This test is not yet approved or  cleared by the [place] FDA and has been authorized for detection and/or diagnosis of SARS-CoV-2 by FDA under an Emergency Use Authorization (EUA).  This EUA will remain in effect (meaning this test can be used) for the duration of the COVID-19 declaration under Section 564(b)(1) of the Act, 21 U.S.C. section  360bbb-3(b)(1), unless the authorization is terminated or revoked sooner.  Performed at [facility], [address] [place], [place] [address]   MRSA PCR Screening     Status: None   Collection Time: 11/03/19  5:25 AM   Specimen: Nasopharyngeal  Result Value Ref Range Status   MRSA by PCR NEGATIVE NEGATIVE Final    Comment:        The GeneXpert MRSA Assay (FDA approved for NASAL specimens only), is one component of a comprehensive MRSA colonization surveillance program. It is not intended to diagnose MRSA infection nor to guide or monitor treatment for MRSA infections. Performed at [facility], [place] [address], [place], [place] [address]      Time coordinating discharge: 40 minutes  SIGNED:   Elmarie Shiley, MD  [facility]

## 2019-11-11 NOTE — Telephone Encounter (Signed)
Called, spoke to daughter- number on file.   Patient contacted regarding discharge from [facility] on 07/02.  Patient understands to follow up with provider Coletta Memos, NP on 07/20 at 11:15 AM at [facility]. Patient understands discharge instructions? Yes Patient understands medications and regiment? Yes Patient understands to bring all medications to this visit? Yes

## 2019-11-13 ENCOUNTER — Encounter: Payer: Self-pay | Admitting: Occupational Therapy

## 2019-11-13 ENCOUNTER — Encounter: Payer: Medicare Other | Admitting: Occupational Therapy

## 2019-11-13 ENCOUNTER — Ambulatory Visit: Payer: No Typology Code available for payment source | Attending: Family Medicine | Admitting: Occupational Therapy

## 2019-11-13 ENCOUNTER — Other Ambulatory Visit: Payer: Self-pay

## 2019-11-13 ENCOUNTER — Ambulatory Visit: Payer: No Typology Code available for payment source

## 2019-11-13 ENCOUNTER — Ambulatory Visit: Payer: No Typology Code available for payment source | Admitting: Speech Pathology

## 2019-11-13 DIAGNOSIS — R1310 Dysphagia, unspecified: Secondary | ICD-10-CM | POA: Insufficient documentation

## 2019-11-13 DIAGNOSIS — N184 Chronic kidney disease, stage 4 (severe): Secondary | ICD-10-CM | POA: Insufficient documentation

## 2019-11-13 DIAGNOSIS — I2510 Atherosclerotic heart disease of native coronary artery without angina pectoris: Secondary | ICD-10-CM | POA: Insufficient documentation

## 2019-11-13 DIAGNOSIS — R41841 Cognitive communication deficit: Secondary | ICD-10-CM | POA: Diagnosis not present

## 2019-11-13 DIAGNOSIS — R414 Neurologic neglect syndrome: Secondary | ICD-10-CM

## 2019-11-13 DIAGNOSIS — R471 Dysarthria and anarthria: Secondary | ICD-10-CM | POA: Insufficient documentation

## 2019-11-13 DIAGNOSIS — I69351 Hemiplegia and hemiparesis following cerebral infarction affecting right dominant side: Secondary | ICD-10-CM | POA: Insufficient documentation

## 2019-11-13 DIAGNOSIS — R2681 Unsteadiness on feet: Secondary | ICD-10-CM | POA: Insufficient documentation

## 2019-11-13 DIAGNOSIS — M25611 Stiffness of right shoulder, not elsewhere classified: Secondary | ICD-10-CM | POA: Diagnosis not present

## 2019-11-13 DIAGNOSIS — Z86718 Personal history of other venous thrombosis and embolism: Secondary | ICD-10-CM | POA: Insufficient documentation

## 2019-11-13 DIAGNOSIS — R600 Localized edema: Secondary | ICD-10-CM | POA: Diagnosis not present

## 2019-11-13 DIAGNOSIS — I4891 Unspecified atrial fibrillation: Secondary | ICD-10-CM | POA: Diagnosis not present

## 2019-11-13 DIAGNOSIS — M6281 Muscle weakness (generalized): Secondary | ICD-10-CM | POA: Diagnosis present

## 2019-11-13 DIAGNOSIS — N400 Enlarged prostate without lower urinary tract symptoms: Secondary | ICD-10-CM | POA: Diagnosis not present

## 2019-11-13 DIAGNOSIS — R41840 Attention and concentration deficit: Secondary | ICD-10-CM | POA: Insufficient documentation

## 2019-11-13 DIAGNOSIS — I129 Hypertensive chronic kidney disease with stage 1 through stage 4 chronic kidney disease, or unspecified chronic kidney disease: Secondary | ICD-10-CM | POA: Diagnosis not present

## 2019-11-13 NOTE — Therapy (Signed)
[facility] [address] [place], [place], [address] Phone: [phone]   Fax:  [phone]  Occupational Therapy Treatment  Patient Details  Name: Robert Mcconnell MRN: [id number] Date of Birth: August 10, 1921 Referring Provider (OT): Eliezer Lofts   Encounter Date: 11/13/2019   OT End of Session - 11/13/19 1812    Visit Number 6    Number of Visits 13    Date for OT Re-Evaluation 11/24/19    Authorization Type VA    Authorization - Visit Number 6    Authorization - Number of Visits 15    OT Start Time 4431    OT Stop Time 1750    OT Time Calculation (min) 45 min    Activity Tolerance Patient limited by fatigue           Past Medical History:  Diagnosis Date  . Atrial fibrillation ([place])   . BPH (benign prostatic hyperplasia)   . CKD (chronic kidney disease)   . History of coronary angioplasty with insertion of stent   . Hypertension   . Severe sepsis ([place]) 11/04/2019  . Stroke [facility])     Past Surgical History:  Procedure Laterality Date  . COLECTOMY  2006   cancer    There were no vitals filed for this visit.   Subjective Assessment - 11/13/19 1808    Subjective  I am so weak    Patient is accompanied by: Family member    Currently in Pain? No/denies    Pain Score 0-No pain                        OT Treatments/Exercises (OP) - 11/13/19 0001      Neurological Re-education Exercises   Other Exercises 1 Neuromuscular reeducation to address patient's ability to weight shift toward left side.  Patient with right hemiplegia, yet pushes to weaker left side due to severe perceptual deficits.  Worked to control alignment in midline for sit to stand, then transition to more upright standing and reaching to target on left.  With repetition, patient able to weight shift to left leg, and also support weight on hemiplegic right leg.  In sitting worked on larger weight shifts to left side - forward and  directly laterally.  Initally patient resistance, but task drove left weight shift for success.  Seated on physioball to address lower trunk initiated weight shifts as patient overuses head/neck upper body when transitioning from one position to the next.                      OT Short Term Goals - 11/13/19 1814      OT SHORT TERM GOAL #1   Title Patient will complete lower body bathing with max assist due 6/19    Status On-going      OT SHORT TERM GOAL #2   Title Patient will bend forward while seated to pull pants up thighs    Status Partially Met      OT SHORT TERM GOAL #3   Status Not Met      OT SHORT TERM GOAL #4   Title Patient/caregiver will don/doff custom RUE splint with min cueing    Status On-going             OT Long Term Goals - 09/25/19 1912      OT LONG TERM GOAL #1   Title Patient will transiton from sit to stand with mod assist  to allow toileting, clothing management, and hygiene    Time 6    Period Weeks    Status New    Target Date 11/24/19      OT LONG TERM GOAL #2   Title Patient/caregiver will complete a home exercise program to prevent pain, and improve passive motion in RUE to aide with hygiene and dressing tasks    Baseline Patient with pain throughout RUE    Time 6    Period Weeks    Status New      OT LONG TERM GOAL #3   Title Patient / caregiver will demonstrate effective edema management techniques for right hand, and support for right arm to prevent further subluxation    Time 6    Period Weeks    Status New      OT LONG TERM GOAL #4   Title Patient will attend to right arm, and manage right arm during transitional movements and in wheelchair with min cueing    Time 6    Period Weeks    Status New                 Plan - 11/13/19 1813    Clinical Impression Statement Patient was hospitalized last week with severe UTI.  Patient notebaly weaker this session, but participating in therapy.  Family extremely involved in  his care, and his rehab.    OT Frequency 2x / week    OT Duration 6 weeks    OT Treatment/Interventions Self-care/ADL training;Electrical Stimulation;Therapeutic exercise;Visual/perceptual remediation/compensation;Patient/family education;Splinting;Neuromuscular education;Therapeutic activities;Balance training;Cognitive remediation/compensation;Passive range of motion;Manual Therapy;DME and/or AE instruction    Plan Finish Resting hand splint-   Postural control, sitting balance, sit to lift off, weight shift right, lower body ADL weight shifts, NMR RUE    OT Home Exercise Plan SLiding right hand on thigh, tilted stool    Consulted and Agree with Plan of Care Patient;Family member/caregiver    Family Member Consulted dtr [name]           Patient will benefit from skilled therapeutic intervention in order to improve the following deficits and impairments:           Visit Diagnosis: Hemiplegia and hemiparesis following cerebral infarction affecting right dominant side (HCC)  Unsteadiness on feet  Muscle weakness (generalized)  Attention and concentration deficit  Neurologic neglect syndrome  Stiffness of right shoulder, not elsewhere classified  Localized edema    Problem List Patient Active Problem List   Diagnosis Date Noted  . Chronic respiratory failure with hypoxia ([place]) 11/07/2019  . Pressure injury of skin 11/05/2019  . Acute renal failure superimposed on stage 4 chronic kidney disease ([place]) 11/04/2019  . Right hemiparesis ([place]) 11/04/2019  . Atrial fibrillation, chronic ([place]) 11/04/2019  . Severe sepsis ([place]) 11/04/2019  . CAP (community acquired pneumonia) 11/04/2019  . Dysphagia 11/04/2019  . Sepsis secondary to UTI ([place]) 11/03/2019  . 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 CKD (chronic kidney disease) stage 4, GFR 15-29 ml/min (HCC) 09/29/2019  . Thrombocytopenia ([place]) 09/29/2019  . Personal history of DVT (deep vein  thrombosis) 09/09/2019  . History of ischemic stroke 09/09/2019  . CAD (coronary artery disease) 09/09/2019  . Chronic constipation 09/09/2019  . History of colorectal cancer 09/09/2019  . 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 Right arm weakness 09/09/2019  . Right leg weakness 09/09/2019  . 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 [address] [place], [place], [address] Phone: [phone]   Fax:  [phone]  Name: Chad Donoghue MRN: [id number] Date of Birth: February 24, 1922

## 2019-11-13 NOTE — Therapy (Signed)
[facility] [address] [place], [place], [address] Phone: [phone]   Fax:  [phone]  Physical Therapy Treatment  Patient Details  Name: Robert Mcconnell MRN: [id number] Date of Birth: April 11, 1922 Referring Provider (PT): Arnell Sieving   Encounter Date: 11/13/2019   PT End of Session - 11/13/19 1856    Visit Number 17    Number of Visits 17    Date for PT Re-Evaluation 12/25/19    Authorization Type VA    Authorization Time Period 09/04/19 to 01/02/20    Authorization - Visit Number 4    Authorization - Number of Visits 15    Progress Note Due on Visit 15    PT Start Time 9371    PT Stop Time 1830    PT Time Calculation (min) 45 min    Equipment Utilized During Treatment Gait belt    Activity Tolerance Patient tolerated treatment well    Behavior During Therapy [facility] for tasks assessed/performed           Past Medical History:  Diagnosis Date  . Atrial fibrillation ([place])   . BPH (benign prostatic hyperplasia)   . CKD (chronic kidney disease)   . History of coronary angioplasty with insertion of stent   . Hypertension   . Severe sepsis ([place]) 11/04/2019  . Stroke [facility])     Past Surgical History:  Procedure Laterality Date  . COLECTOMY  2006   cancer    There were no vitals filed for this visit.   Subjective Assessment - 11/13/19 1854    Subjective Daughter reporst he was in hospital for 5 days due to UTI. He has his new power chair now.    Patient is accompained by: Family member    Pertinent History CVA    Limitations Standing;Walking;House hold activities;Sitting    How long can you sit comfortably? 1-2 hours    How long can you stand comfortably? <5 min    How long can you walk comfortably? unable    Patient Stated Goals walk    Currently in Pain? No/denies                     Theractivity: Mat to wheelchair and wheelchair to power chair transfers: Stand pivot; max A x 1  required Wheelchair unilateral LE only (R) push and pull: 5 x 10 feet with mod A with pushing, max a with pulling Wheelchair push and pull: bil: max A required to position R LE before pushing an pulling E-stim: [place] setting: 5mA for quads, 49mA for hamstrings: 4" on 12" off for quads, 10" on 10" off for hamstrings: 20 reps                  PT Short Term Goals - 10/30/19 1809      PT SHORT TERM GOAL #1   Title Patient will be able to perform sit to stand with mod A with or without AD to improve independence and reduce care giver burden    Baseline max A (eval); mod to max A (10/30/19)    Time 4    Period Weeks    Status On-going    Target Date 11/27/19      PT SHORT TERM GOAL #2   Title pt will be able to stand with mod A for 5 min to perform small reaching with L UE to improve standing balance.    Baseline able to stand with holding on to parallel  bars with L UE but needs stabilization at knees to improve standing tolerance (max A required overall) is able to reach with max A    Time 4    Period Weeks    Status On-going    Target Date 11/27/19      PT SHORT TERM GOAL #3   Title Pt will be able to propel chair with SBA  for 30 feet to improve household navigation    Baseline Able to propel chair with use of L UE and L LE with mod A for 30 feet (10/30/19)    Time 4    Period Weeks    Status Achieved    Target Date 10/06/19             PT Long Term Goals - 10/30/19 1812      PT LONG TERM GOAL #1   Title Patient will be able to stand for 5 min with CGA to improve standing endurance with or without AD    Baseline requires max A    Time 8    Period Weeks    Status On-going    Target Date 12/25/19      PT LONG TERM GOAL #2   Title Pt will require CGA with sit to stand and chair to chair transfers to improve indepdnence and reduce caregiver burden.    Baseline max to total A    Time 8    Period Weeks    Status On-going    Target Date 12/25/19      PT  LONG TERM GOAL #3   Title Patient will be able to walk into walk-in shower and sit on shower chair with CGA to improve independence.    Baseline bed sponge bath only    Time 8    Period Weeks    Status On-going    Target Date 12/25/19      PT LONG TERM GOAL #4   Title Pt will demo >10/56 on BBS to improve static balance and reduce fall risk    Baseline BBS 4/56 (eval)    Time 8    Period Weeks    Status On-going    Target Date 12/25/19                 Plan - 11/13/19 1854    Clinical Impression Statement today's session was focused on improving quad and hmastring control in R LE. Patient is starting to demonstrating increasing muscle tone in his R leg compared to more flacid tone in previous session. He demonstrated improved muscle control in quad and hamstrings with wheelchair push and pull. Pt demonstrated improved ability to use quads with pushing the wheelchair after e-stim.    Personal Factors and Comorbidities Age;Comorbidity 3+    Comorbidities hx of stroke, hx of colorectal cancer    Examination-Activity Limitations Bathing;Bed Mobility;Caring for Others;Carry;Locomotion Level;Hygiene/Grooming;Dressing;Continence;Self Feeding;Reach Overhead;Sit;Lift;Other;Transfers;Toileting;Stand;Stairs    Examination-Participation Restrictions Church;Cleaning;Community Activity;Driving;Meal Prep;Volunteer;Shop;Medication Management;Laundry;Personal Finances;Yard Work;Interpersonal Relationship    Stability/Clinical Decision Making Unstable/Unpredictable    Rehab Potential Good    PT Frequency 2x / week    PT Duration 8 weeks    PT Treatment/Interventions ADLs/Self Care Home Management;Electrical Stimulation;Gait training;Stair training;Functional mobility training;Therapeutic exercise;Balance training;Neuromuscular re-education;Therapeutic activities;Patient/family education;Wheelchair mobility training;Manual techniques;Passive range of motion    PT Next Visit Plan continue to work on  sit to stand transfers, chair to chair transfers, wheelchair mobility           Patient will benefit from skilled therapeutic intervention in  order to improve the following deficits and impairments:  Abnormal gait, Decreased activity tolerance, Decreased coordination, Decreased safety awareness, Decreased strength, Impaired flexibility, Impaired UE functional use, Postural dysfunction, Impaired tone, Increased edema, Decreased range of motion, Decreased knowledge of precautions, Impaired sensation, Difficulty walking, Decreased mobility, Decreased endurance, Decreased balance  Visit Diagnosis: Hemiplegia and hemiparesis following cerebral infarction affecting right dominant side (HCC)  Unsteadiness on feet  Muscle weakness (generalized)     Problem List Patient Active Problem List   Diagnosis Date Noted  . Chronic respiratory failure with hypoxia ([place]) 11/07/2019  . Pressure injury of skin 11/05/2019  . Acute renal failure superimposed on stage 4 chronic kidney disease ([place]) 11/04/2019  . Right hemiparesis ([place]) 11/04/2019  . Atrial fibrillation, chronic ([place]) 11/04/2019  . Severe sepsis ([place]) 11/04/2019  . CAP (community acquired pneumonia) 11/04/2019  . Dysphagia 11/04/2019  . Sepsis secondary to UTI ([place]) 11/03/2019  . 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 CKD (chronic kidney disease) stage 4, GFR 15-29 ml/min (HCC) 09/29/2019  . Thrombocytopenia ([place]) 09/29/2019  . Personal history of DVT (deep vein thrombosis) 09/09/2019  . History of ischemic stroke 09/09/2019  . CAD (coronary artery disease) 09/09/2019  . Chronic constipation 09/09/2019  . History of colorectal cancer 09/09/2019  . 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 Right arm weakness 09/09/2019  . Right leg weakness 09/09/2019  . 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 [place], [place], [address] Phone: [phone]   Fax:  [phone]  Name: Robert Mcconnell MRN: [id number] Date of Birth: 03/23/1922

## 2019-11-17 ENCOUNTER — Other Ambulatory Visit: Payer: Self-pay

## 2019-11-17 ENCOUNTER — Ambulatory Visit: Payer: No Typology Code available for payment source

## 2019-11-17 ENCOUNTER — Ambulatory Visit: Payer: No Typology Code available for payment source | Admitting: Occupational Therapy

## 2019-11-17 ENCOUNTER — Encounter: Payer: Self-pay | Admitting: Occupational Therapy

## 2019-11-17 DIAGNOSIS — M25611 Stiffness of right shoulder, not elsewhere classified: Secondary | ICD-10-CM

## 2019-11-17 DIAGNOSIS — M6281 Muscle weakness (generalized): Secondary | ICD-10-CM

## 2019-11-17 DIAGNOSIS — R2681 Unsteadiness on feet: Secondary | ICD-10-CM

## 2019-11-17 DIAGNOSIS — I69351 Hemiplegia and hemiparesis following cerebral infarction affecting right dominant side: Secondary | ICD-10-CM

## 2019-11-17 DIAGNOSIS — R600 Localized edema: Secondary | ICD-10-CM

## 2019-11-17 DIAGNOSIS — R41840 Attention and concentration deficit: Secondary | ICD-10-CM

## 2019-11-17 DIAGNOSIS — R414 Neurologic neglect syndrome: Secondary | ICD-10-CM

## 2019-11-17 NOTE — Therapy (Signed)
[facility] [address] [place], [place], [address] Phone: [phone]   Fax:  [phone]  Occupational Therapy Treatment  Patient Details  Name: Robert Mcconnell MRN: [id number] Date of Birth: 07/01/1921 Referring Provider (OT): Eliezer Lofts   Encounter Date: 11/17/2019   OT End of Session - 11/17/19 1845    Visit Number 7    Number of Visits 13    Date for OT Re-Evaluation 11/24/19    Authorization Type VA    Authorization - Visit Number 7    Authorization - Number of Visits 15    OT Start Time 4259    OT Stop Time 5638    OT Time Calculation (min) 38 min    Activity Tolerance Patient tolerated treatment well    Behavior During Therapy [facility] for tasks assessed/performed           Past Medical History:  Diagnosis Date  . Atrial fibrillation ([place])   . BPH (benign prostatic hyperplasia)   . CKD (chronic kidney disease)   . History of coronary angioplasty with insertion of stent   . Hypertension   . Severe sepsis ([place]) 11/04/2019  . Stroke [facility])     Past Surgical History:  Procedure Laterality Date  . COLECTOMY  2006   cancer    There were no vitals filed for this visit.   Subjective Assessment - 11/17/19 1841    Subjective  This leg's no good    Patient is accompanied by: Family member    Currently in Pain? No/denies    Pain Score 0-No pain                        OT Treatments/Exercises (OP) - 11/17/19 0001      Neurological Re-education Exercises   Other Exercises 1 Neuromuscular reeducation to address weight shifting toward right and left in seated position as needed for transition from sit to sidelying.  Patient reporting lateral thigh pain (tightness with this stretch)  Worked on midline orientation and sit to stand tranisition with emphasis on hip, knee, and trunk extension.  Patient with tendency to pull downward, but with support and cueing able to reach upward, and even weight  shift R/L with decreased assistance.  Patient very fearful of upright, but seems to show improvement with simple repetition.                      OT Short Term Goals - 11/17/19 1846      OT SHORT TERM GOAL #1   Title Patient will complete lower body bathing with max assist due 6/19    Status On-going      OT SHORT TERM GOAL #2   Title Patient will bend forward while seated to pull pants up thighs    Status Achieved      OT SHORT TERM GOAL #3   Title Patient will transition from sit to stand with mod assist in prep for tolieting on commode    Status On-going      OT SHORT TERM GOAL #4   Title Patient/caregiver will don/doff custom RUE splint with min cueing    Status On-going             OT Long Term Goals - 09/25/19 1912      OT LONG TERM GOAL #1   Title Patient will transiton from sit to stand with mod assist to allow toileting, clothing management, and hygiene  Time 6    Period Weeks    Status New    Target Date 11/24/19      OT LONG TERM GOAL #2   Title Patient/caregiver will complete a home exercise program to prevent pain, and improve passive motion in RUE to aide with hygiene and dressing tasks    Baseline Patient with pain throughout RUE    Time 6    Period Weeks    Status New      OT LONG TERM GOAL #3   Title Patient / caregiver will demonstrate effective edema management techniques for right hand, and support for right arm to prevent further subluxation    Time 6    Period Weeks    Status New      OT LONG TERM GOAL #4   Title Patient will attend to right arm, and manage right arm during transitional movements and in wheelchair with min cueing    Time 6    Period Weeks    Status New                 Plan - 11/17/19 1845    Clinical Impression Statement Patient with significant perceptual and attentional deficits, but is participating in therapy with encouragement.    OT Frequency 2x / week    OT Duration 6 weeks    OT  Treatment/Interventions Self-care/ADL training;Electrical Stimulation;Therapeutic exercise;Visual/perceptual remediation/compensation;Patient/family education;Splinting;Neuromuscular education;Therapeutic activities;Balance training;Cognitive remediation/compensation;Passive range of motion;Manual Therapy;DME and/or AE instruction    Plan Finish Resting hand splint-   Postural control, sitting balance, sit to lift off, weight shift right, lower body ADL weight shifts, NMR RUE    OT Home Exercise Plan SLiding right hand on thigh, tilted stool    Consulted and Agree with Plan of Care Patient;Family member/caregiver    Family Member Consulted dtr [name]           Patient will benefit from skilled therapeutic intervention in order to improve the following deficits and impairments:           Visit Diagnosis: Hemiplegia and hemiparesis following cerebral infarction affecting right dominant side (HCC)  Unsteadiness on feet  Muscle weakness (generalized)  Attention and concentration deficit  Neurologic neglect syndrome  Stiffness of right shoulder, not elsewhere classified  Localized edema    Problem List Patient Active Problem List   Diagnosis Date Noted  . Chronic respiratory failure with hypoxia ([place]) 11/07/2019  . Pressure injury of skin 11/05/2019  . Acute renal failure superimposed on stage 4 chronic kidney disease ([place]) 11/04/2019  . Right hemiparesis ([place]) 11/04/2019  . Atrial fibrillation, chronic ([place]) 11/04/2019  . Severe sepsis ([place]) 11/04/2019  . CAP (community acquired pneumonia) 11/04/2019  . Dysphagia 11/04/2019  . Sepsis secondary to UTI ([place]) 11/03/2019  . 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 CKD (chronic kidney disease) stage 4, GFR 15-29 ml/min (HCC) 09/29/2019  . Thrombocytopenia ([place]) 09/29/2019  . Personal history of DVT (deep vein thrombosis) 09/09/2019  . History of ischemic stroke 09/09/2019  . CAD (coronary artery  disease) 09/09/2019  . Chronic constipation 09/09/2019  . History of colorectal cancer 09/09/2019  . 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 Right arm weakness 09/09/2019  . Right leg weakness 09/09/2019  . 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

## 2019-11-17 NOTE — Therapy (Signed)
[facility] [address] [place], [place], [address] Phone: [phone]   Fax:  [phone]  Physical Therapy Treatment  Patient Details  Name: Robert Mcconnell MRN: [id number] Date of Birth: August 03, 1921 Referring Provider (PT): Arnell Sieving   Encounter Date: 11/17/2019   PT End of Session - 11/17/19 1843    Visit Number 18    Number of Visits 32    Date for PT Re-Evaluation 12/25/19    Authorization Type VA    Authorization Time Period 15 visits from 7/8-11/5    Authorization - Visit Number 2    Authorization - Number of Visits 15    Progress Note Due on Visit 15    PT Start Time 1615    PT Stop Time 1700    PT Time Calculation (min) 45 min    Equipment Utilized During Treatment Gait belt    Activity Tolerance Patient tolerated treatment well    Behavior During Therapy [facility] for tasks assessed/performed           Past Medical History:  Diagnosis Date  . Atrial fibrillation ([place])   . BPH (benign prostatic hyperplasia)   . CKD (chronic kidney disease)   . History of coronary angioplasty with insertion of stent   . Hypertension   . Severe sepsis ([place]) 11/04/2019  . Stroke [facility])     Past Surgical History:  Procedure Laterality Date  . COLECTOMY  2006   cancer    There were no vitals filed for this visit.   Subjective Assessment - 11/17/19 1840    Subjective no new complaints    Patient is accompained by: Family member    Pertinent History CVA    Limitations Standing;Walking;House hold activities;Sitting    How long can you sit comfortably? 1-2 hours    How long can you stand comfortably? <5 min    How long can you walk comfortably? unable    Patient Stated Goals walk    Currently in Pain? No/denies                   Sit to stand using parallel bars in front: 10x during the session, min A with cueing required Sit to stand using wheelchair arm rest: 3x, mod to max A  required Standing balance: using hemi walker on left, moderate to max A with verbal and tactile cueing due to excessive anterior lean and lean to R, pt needed cues to improve WB through L LE and L UE. Pt tends to pull up on walker and needed cues to push down on walker to improve stability and WB through L UE. Moderate guarding at R knee.                    PT Short Term Goals - 10/30/19 1809      PT SHORT TERM GOAL #1   Title Patient will be able to perform sit to stand with mod A with or without AD to improve independence and reduce care giver burden    Baseline max A (eval); mod to max A (10/30/19)    Time 4    Period Weeks    Status On-going    Target Date 11/27/19      PT SHORT TERM GOAL #2   Title pt will be able to stand with mod A for 5 min to perform small reaching with L UE to improve standing balance.    Baseline able to stand with holding  on to parallel bars with L UE but needs stabilization at knees to improve standing tolerance (max A required overall) is able to reach with max A    Time 4    Period Weeks    Status On-going    Target Date 11/27/19      PT SHORT TERM GOAL #3   Title Pt will be able to propel chair with SBA  for 30 feet to improve household navigation    Baseline Able to propel chair with use of L UE and L LE with mod A for 30 feet (10/30/19)    Time 4    Period Weeks    Status Achieved    Target Date 10/06/19             PT Long Term Goals - 10/30/19 1812      PT LONG TERM GOAL #1   Title Patient will be able to stand for 5 min with CGA to improve standing endurance with or without AD    Baseline requires max A    Time 8    Period Weeks    Status On-going    Target Date 12/25/19      PT LONG TERM GOAL #2   Title Pt will require CGA with sit to stand and chair to chair transfers to improve indepdnence and reduce caregiver burden.    Baseline max to total A    Time 8    Period Weeks    Status On-going    Target Date 12/25/19       PT LONG TERM GOAL #3   Title Patient will be able to walk into walk-in shower and sit on shower chair with CGA to improve independence.    Baseline bed sponge bath only    Time 8    Period Weeks    Status On-going    Target Date 12/25/19      PT LONG TERM GOAL #4   Title Pt will demo >10/56 on BBS to improve static balance and reduce fall risk    Baseline BBS 4/56 (eval)    Time 8    Period Weeks    Status On-going    Target Date 12/25/19                 Plan - 11/17/19 1841    Clinical Impression Statement today's session was focused on improivng sit to stand transfers and improving standing balance with improivng WB through L LE and L UE to improve stabilization using hemi walker. Pt requires moderate to min A to stand up when pulling on parallel bars in front but requires moderate to max A when standing up using chair arm rest due to excessive lean to R. Pt needed moderate to max cueing and tactile input to maintain standing balance with use of hemi walker on L UE and to guard R knee.    Personal Factors and Comorbidities Age;Comorbidity 3+    Comorbidities hx of stroke, hx of colorectal cancer    Examination-Activity Limitations Bathing;Bed Mobility;Caring for Others;Carry;Locomotion Level;Hygiene/Grooming;Dressing;Continence;Self Feeding;Reach Overhead;Sit;Lift;Other;Transfers;Toileting;Stand;Stairs    Examination-Participation Restrictions Church;Cleaning;Community Activity;Driving;Meal Prep;Volunteer;Shop;Medication Management;Laundry;Personal Finances;Yard Work;Interpersonal Relationship    Stability/Clinical Decision Making Unstable/Unpredictable    Rehab Potential Good    PT Frequency 2x / week    PT Duration 8 weeks    PT Treatment/Interventions ADLs/Self Care Home Management;Electrical Stimulation;Gait training;Stair training;Functional mobility training;Therapeutic exercise;Balance training;Neuromuscular re-education;Therapeutic activities;Patient/family  education;Wheelchair mobility training;Manual techniques;Passive range of motion    PT Next Visit  Plan continue to work on sit to stand transfers, chair to chair transfers, wheelchair mobility           Patient will benefit from skilled therapeutic intervention in order to improve the following deficits and impairments:  Abnormal gait, Decreased activity tolerance, Decreased coordination, Decreased safety awareness, Decreased strength, Impaired flexibility, Impaired UE functional use, Postural dysfunction, Impaired tone, Increased edema, Decreased range of motion, Decreased knowledge of precautions, Impaired sensation, Difficulty walking, Decreased mobility, Decreased endurance, Decreased balance  Visit Diagnosis: Hemiplegia and hemiparesis following cerebral infarction affecting right dominant side (HCC)  Unsteadiness on feet  Muscle weakness (generalized)     Problem List Patient Active Problem List   Diagnosis Date Noted  . Chronic respiratory failure with hypoxia ([place]) 11/07/2019  . Pressure injury of skin 11/05/2019  . Acute renal failure superimposed on stage 4 chronic kidney disease ([place]) 11/04/2019  . Right hemiparesis ([place]) 11/04/2019  . Atrial fibrillation, chronic ([place]) 11/04/2019  . Severe sepsis ([place]) 11/04/2019  . CAP (community acquired pneumonia) 11/04/2019  . Dysphagia 11/04/2019  . Sepsis secondary to UTI ([place]) 11/03/2019  . 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 CKD (chronic kidney disease) stage 4, GFR 15-29 ml/min (HCC) 09/29/2019  . Thrombocytopenia ([place]) 09/29/2019  . Personal history of DVT (deep vein thrombosis) 09/09/2019  . History of ischemic stroke 09/09/2019  . CAD (coronary artery disease) 09/09/2019  . Chronic constipation 09/09/2019  . History of colorectal cancer 09/09/2019  . 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 Right arm weakness 09/09/2019  . Right leg weakness 09/09/2019  . 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

## 2019-11-18 ENCOUNTER — Ambulatory Visit: Payer: No Typology Code available for payment source

## 2019-11-18 ENCOUNTER — Ambulatory Visit: Payer: No Typology Code available for payment source | Admitting: Occupational Therapy

## 2019-11-20 ENCOUNTER — Encounter: Payer: Self-pay | Admitting: Occupational Therapy

## 2019-11-20 ENCOUNTER — Ambulatory Visit: Payer: No Typology Code available for payment source | Admitting: Occupational Therapy

## 2019-11-20 ENCOUNTER — Other Ambulatory Visit: Payer: Self-pay

## 2019-11-20 ENCOUNTER — Ambulatory Visit: Payer: No Typology Code available for payment source

## 2019-11-20 ENCOUNTER — Ambulatory Visit: Payer: No Typology Code available for payment source | Admitting: Speech Pathology

## 2019-11-20 DIAGNOSIS — R471 Dysarthria and anarthria: Secondary | ICD-10-CM

## 2019-11-20 DIAGNOSIS — I69351 Hemiplegia and hemiparesis following cerebral infarction affecting right dominant side: Secondary | ICD-10-CM | POA: Diagnosis not present

## 2019-11-20 DIAGNOSIS — R2681 Unsteadiness on feet: Secondary | ICD-10-CM

## 2019-11-20 DIAGNOSIS — M6281 Muscle weakness (generalized): Secondary | ICD-10-CM

## 2019-11-20 DIAGNOSIS — R600 Localized edema: Secondary | ICD-10-CM

## 2019-11-20 DIAGNOSIS — R1310 Dysphagia, unspecified: Secondary | ICD-10-CM

## 2019-11-20 DIAGNOSIS — R41840 Attention and concentration deficit: Secondary | ICD-10-CM

## 2019-11-20 DIAGNOSIS — M25611 Stiffness of right shoulder, not elsewhere classified: Secondary | ICD-10-CM

## 2019-11-20 DIAGNOSIS — R414 Neurologic neglect syndrome: Secondary | ICD-10-CM

## 2019-11-20 DIAGNOSIS — R41841 Cognitive communication deficit: Secondary | ICD-10-CM

## 2019-11-20 NOTE — Therapy (Signed)
[facility] [address] [place], [place], [address] Phone: [phone]   Fax:  [phone]  Physical Therapy Treatment  Patient Details  Name: Robert Mcconnell MRN: [id number] Date of Birth: 09-28-1921 Referring Provider (PT): Arnell Sieving   Encounter Date: 11/20/2019   PT End of Session - 11/20/19 1718    Visit Number 19    Number of Visits 32    Date for PT Re-Evaluation 12/25/19    Authorization Type VA    Authorization Time Period 15 visits from 7/8-11/5    Authorization - Visit Number 3    Authorization - Number of Visits 15    Progress Note Due on Visit 15    PT Start Time 1615    PT Stop Time 1700    PT Time Calculation (min) 45 min    Equipment Utilized During Treatment Gait belt    Activity Tolerance Patient tolerated treatment well    Behavior During Therapy [facility] for tasks assessed/performed           Past Medical History:  Diagnosis Date  . Atrial fibrillation ([place])   . BPH (benign prostatic hyperplasia)   . CKD (chronic kidney disease)   . History of coronary angioplasty with insertion of stent   . Hypertension   . Severe sepsis ([place]) 11/04/2019  . Stroke [facility])     Past Surgical History:  Procedure Laterality Date  . COLECTOMY  2006   cancer    There were no vitals filed for this visit.   Subjective Assessment - 11/20/19 1712    Subjective Pt reports no new complaints    Patient is accompained by: Family member    Pertinent History CVA    Limitations Standing;Walking;House hold activities;Sitting    How long can you sit comfortably? 1-2 hours    How long can you stand comfortably? <5 min    How long can you walk comfortably? unable    Patient Stated Goals walk    Currently in Pain? No/denies                Gait training: wit biodex harness with 95-105lb unweighted, 3 x 12 feet Max A x 2 (one person shifting weight and moving LE during swing phase) other person  guiding the harness system                        PT Short Term Goals - 10/30/19 1809      PT SHORT TERM GOAL #1   Title Patient will be able to perform sit to stand with mod A with or without AD to improve independence and reduce care giver burden    Baseline max A (eval); mod to max A (10/30/19)    Time 4    Period Weeks    Status On-going    Target Date 11/27/19      PT SHORT TERM GOAL #2   Title pt will be able to stand with mod A for 5 min to perform small reaching with L UE to improve standing balance.    Baseline able to stand with holding on to parallel bars with L UE but needs stabilization at knees to improve standing tolerance (max A required overall) is able to reach with max A    Time 4    Period Weeks    Status On-going    Target Date 11/27/19      PT SHORT TERM GOAL #3  Title Pt will be able to propel chair with SBA  for 30 feet to improve household navigation    Baseline Able to propel chair with use of L UE and L LE with mod A for 30 feet (10/30/19)    Time 4    Period Weeks    Status Achieved    Target Date 10/06/19             PT Long Term Goals - 10/30/19 1812      PT LONG TERM GOAL #1   Title Patient will be able to stand for 5 min with CGA to improve standing endurance with or without AD    Baseline requires max A    Time 8    Period Weeks    Status On-going    Target Date 12/25/19      PT LONG TERM GOAL #2   Title Pt will require CGA with sit to stand and chair to chair transfers to improve indepdnence and reduce caregiver burden.    Baseline max to total A    Time 8    Period Weeks    Status On-going    Target Date 12/25/19      PT LONG TERM GOAL #3   Title Patient will be able to walk into walk-in shower and sit on shower chair with CGA to improve independence.    Baseline bed sponge bath only    Time 8    Period Weeks    Status On-going    Target Date 12/25/19      PT LONG TERM GOAL #4   Title Pt will demo  >10/56 on BBS to improve static balance and reduce fall risk    Baseline BBS 4/56 (eval)    Time 8    Period Weeks    Status On-going    Target Date 12/25/19                 Plan - 11/20/19 1712    Clinical Impression Statement Today's session was focused on gait training with biodex harness system. Patient needed max A with weight shifts and to advance the R>L LE forward. Pt needed max cueing to WB through bil LE instead of just hanging in the harness system. 2 x 10 feet were practiced with hemi walker  and x 1 x 10 feet were done with biodex handle on L UE only. Pt walked about 35 feet with max A and use of biodex system.    Personal Factors and Comorbidities Age;Comorbidity 3+    Comorbidities hx of stroke, hx of colorectal cancer    Examination-Activity Limitations Bathing;Bed Mobility;Caring for Others;Carry;Locomotion Level;Hygiene/Grooming;Dressing;Continence;Self Feeding;Reach Overhead;Sit;Lift;Other;Transfers;Toileting;Stand;Stairs    Examination-Participation Restrictions Church;Cleaning;Community Activity;Driving;Meal Prep;Volunteer;Shop;Medication Management;Laundry;Personal Finances;Yard Work;Interpersonal Relationship    Stability/Clinical Decision Making Unstable/Unpredictable    Rehab Potential Good    PT Frequency 2x / week    PT Duration 8 weeks    PT Treatment/Interventions ADLs/Self Care Home Management;Electrical Stimulation;Gait training;Stair training;Functional mobility training;Therapeutic exercise;Balance training;Neuromuscular re-education;Therapeutic activities;Patient/family education;Wheelchair mobility training;Manual techniques;Passive range of motion    PT Next Visit Plan continue to work on sit to stand transfers, chair to chair transfers, wheelchair mobility           Patient will benefit from skilled therapeutic intervention in order to improve the following deficits and impairments:  Abnormal gait, Decreased activity tolerance, Decreased  coordination, Decreased safety awareness, Decreased strength, Impaired flexibility, Impaired UE functional use, Postural dysfunction, Impaired tone, Increased edema, Decreased range of motion,  Decreased knowledge of precautions, Impaired sensation, Difficulty walking, Decreased mobility, Decreased endurance, Decreased balance  Visit Diagnosis: Hemiplegia and hemiparesis following cerebral infarction affecting right dominant side (HCC)  Unsteadiness on feet  Muscle weakness (generalized)     Problem List Patient Active Problem List   Diagnosis Date Noted  . Chronic respiratory failure with hypoxia ([place]) 11/07/2019  . Pressure injury of skin 11/05/2019  . Acute renal failure superimposed on stage 4 chronic kidney disease ([place]) 11/04/2019  . Right hemiparesis ([place]) 11/04/2019  . Atrial fibrillation, chronic ([place]) 11/04/2019  . Severe sepsis ([place]) 11/04/2019  . CAP (community acquired pneumonia) 11/04/2019  . Dysphagia 11/04/2019  . Sepsis secondary to UTI ([place]) 11/03/2019  . 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 CKD (chronic kidney disease) stage 4, GFR 15-29 ml/min (HCC) 09/29/2019  . Thrombocytopenia ([place]) 09/29/2019  . Personal history of DVT (deep vein thrombosis) 09/09/2019  . History of ischemic stroke 09/09/2019  . CAD (coronary artery disease) 09/09/2019  . Chronic constipation 09/09/2019  . History of colorectal cancer 09/09/2019  . 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 Right arm weakness 09/09/2019  . Right leg weakness 09/09/2019  . 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[place]) 09/09/2019    Kerrie Pleasure 11/20/2019, 5:20 PM  [facility] [address] [place], [place], [address] Phone: [phone]   Fax:  [phone]  Name: Robert Mcconnell MRN: [id number] Date of Birth: 1921/08/30

## 2019-11-20 NOTE — Therapy (Signed)
[facility] [address] [place], [place], [address] Phone: [phone]   Fax:  [phone]  Speech Language Pathology Treatment  Patient Details  Name: Robert Mcconnell MRN: [id number] Date of Birth: [birth year] Referring Provider (SLP): Wynema Birch, [place]   Encounter Date: 11/20/2019   End of Session - 11/20/19 1838    Visit Number 5    Number of Visits 15    Date for SLP Re-Evaluation 12/05/19    Authorization Type [facility]    Authorization - Visit Number 5    Authorization - Number of Visits 15    SLP Start Time 7622    SLP Stop Time  6333    SLP Time Calculation (min) 40 min    Activity Tolerance Patient tolerated treatment well           Past Medical History:  Diagnosis Date  . Atrial fibrillation ([place])   . BPH (benign prostatic hyperplasia)   . CKD (chronic kidney disease)   . History of coronary angioplasty with insertion of stent   . Hypertension   . Severe sepsis ([place]) 11/04/2019  . Stroke [facility])     Past Surgical History:  Procedure Laterality Date  . COLECTOMY  2006   cancer    There were no vitals filed for this visit.   Subjective Assessment - 11/20/19 1828    Subjective "We can understand him much better."    Patient is accompained by: Family member   daughter Robert Mcconnell   Currently in Pain? No/denies                 ADULT SLP TREATMENT - 11/20/19 1828      General Information   Behavior/Cognition Requires cueing;Decreased sustained attention;Alert      Treatment Provided   Treatment provided Cognitive-Linquistic;Dysphagia      Cognitive-Linquistic Treatment   Treatment focused on Dysarthria;Patient/family/caregiver education    Skilled Treatment Daughter reports that pt's spouse and her ability to understand pt are much improved. Pt was admitted to hospital for sepsis, UTI. SLP evaluated in acute and so this therapist reviewed recommendations for dysphagia; daughter continues to  express desire for pt to consume regular diet and thin liquids due to concern for dehydration and pt refusal of thickener. Reviewed compensations to maximize safe swallow; daughter verbalized these without cues (lingual sweep, check for pocketing, clear mouth before taking additional bites, no solids and liquids in mouth at same time). Daughter aware of signs of aspiration PNA and reports monitoring vitals, lung sounds daily. Based on pt's "S" statement, SLP discussed plan of care and daughter in agreement that likely only 1-2 more sessions of ST necessary to focus on caregiver cuing for dysarthria compensations and for learning appropriate activities for home. Throughout session pt perseverated on previous therapy session with PT, required frequent redirection to focus on speech. Usual mod-max A for intensity in automatic speech and simple naming tasks, finally progressing to simple conversational responses (~60% intelligible to SLP, 90% intelligible to daughter). In most cases, daughter cuing imitation: "Say ____." SLP explained cuing hierarchy and encouraged allowing more time for pt response, then attempting question cue ("What?"), direct verbal cue ("Say it louder" or "Shout") before resorting to imitation.       Assessment / Recommendations / Plan   Plan Continue with current plan of care      Dysphagia Recommendations   Diet recommendations --   pt/daughter elect for pt to take thin/regular    Compensations Check for pocketing;Small sips/bites;Slow  rate;Multiple dry swallows after each bite/sip;Clear throat intermittently   lingual sweep     Progression Toward Goals   Progression toward goals Goals downgraded            SLP Education - 11/20/19 1838    Education Details cuing strategies to assist pt with intelligibility    Person(s) Educated Patient;Child(ren)    Methods Explanation;Demonstration;Verbal cues    Comprehension Verbalized understanding;Returned demonstration;Need further  instruction;Verbal cues required            SLP Short Term Goals - 11/20/19 1838      SLP SHORT TERM GOAL #1   Title pt will demo sustained attention for a 5- minute familiar task x5, in 3 sessions    Time 3    Period Weeks    Status Deferred      SLP SHORT TERM GOAL #2   Title pt will demo knowledge of a memory book when asked pertinent questions dealing with memory in 3 sessions    Time 3    Period Weeks    Status Deferred      SLP SHORT TERM GOAL #3   Title Pt will complete dysarthria HEP with usual mod cues from caregiver x3 sessions.    Time 2    Period Weeks    Status On-going            SLP Long Term Goals - 11/20/19 1839      SLP LONG TERM GOAL #1   Title pt will demo sustained attention for 3 10 minute therapy tasks in 3 sessions    Time 7    Period Weeks   or 15 total visits, for all LTGs   Status Deferred      SLP LONG TERM GOAL #2   Title pt will engage with memory notebook/planner, WNL procedure, with min nonverbal cues to do so, in 3 sessions    Time 7    Period Weeks    Status Deferred      SLP LONG TERM GOAL #3   Title Pt/family will tell SLP 3 signs of aspiration PNA.    Time 7    Period Weeks    Status Achieved      SLP LONG TERM GOAL #4   Title Caregiver will demonstrate appropriate cuing when speech is unintelligible.    Time 2    Period Weeks    Status New            Plan - 11/20/19 1840    Clinical Impression Statement Pt presents with significant deficits in cognitive linguistics incluing attention, memory, awareness. Pt with multi-factorial sx making improvement in speech and language difficult but SLP; attention today was adequate for pt to participate in simple functional speech tasks with mod-max cues. Pt presentation similar to eval however daughter reports improved understanding of pt communicating at home. As she expresses desire to focus on PT and OT, daughter in agreement that 1-2 more sessions for ST would be appropriate to  focus on caregiver education in cuing strategies to maximize pt's intelligibility at home.    Speech Therapy Frequency 2x / week    Duration --   7 weeks (or 15 visits - [facility] auth)   Treatment/Interventions Environmental controls;Oral motor exercises;Functional tasks;Multimodal communcation approach;Cueing hierarchy;Trials of upgraded texture/liquids;Diet toleration management by SLP;SLP instruction and feedback;Language facilitation;Cognitive reorganization;Compensatory strategies;Patient/family education;Internal/external aids    Potential to Achieve Goals Fair    Potential Considerations Severity of impairments    Consulted and Agree  with Plan of Care Patient;Family member/caregiver    Family Member Consulted son in law           Patient will benefit from skilled therapeutic intervention in order to improve the following deficits and impairments:   Dysarthria and anarthria  Dysphagia, unspecified type  Cognitive communication deficit    Problem List Patient Active Problem List   Diagnosis Date Noted  . Chronic respiratory failure with hypoxia ([place]) 11/07/2019  . Pressure injury of skin 11/05/2019  . Acute renal failure superimposed on stage 4 chronic kidney disease ([place]) 11/04/2019  . Right hemiparesis ([place]) 11/04/2019  . Atrial fibrillation, chronic ([place]) 11/04/2019  . Severe sepsis ([place]) 11/04/2019  . CAP (community acquired pneumonia) 11/04/2019  . Dysphagia 11/04/2019  . Sepsis secondary to UTI ([place]) 11/03/2019  . 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 CKD (chronic kidney disease) stage 4, GFR 15-29 ml/min (HCC) 09/29/2019  . Thrombocytopenia ([place]) 09/29/2019  . Personal history of DVT (deep vein thrombosis) 09/09/2019  . History of ischemic stroke 09/09/2019  . CAD (coronary artery disease) 09/09/2019  . Chronic constipation 09/09/2019  . History of colorectal cancer 09/09/2019  . 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 Right arm  weakness 09/09/2019  . Right leg weakness 09/09/2019  . 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[place]) 09/09/2019   Deneise Lever, [place], CCC-SLP Speech-Language Pathologist  [facility] 11/20/2019, [facility] [address] [place], [place], [address] Phone: [phone]   Fax:  [phone]   Name: Robert Mcconnell MRN: [id number] Date of Birth: 06-29-1921

## 2019-11-20 NOTE — Therapy (Signed)
[facility] [address] [place], [place], [address] Phone: [phone]   Fax:  [phone]  Occupational Therapy Treatment  Patient Details  Name: Robert Mcconnell MRN: [id number] Date of Birth: December 27, 1921 Referring Provider (OT): Eliezer Lofts   Encounter Date: 11/20/2019   OT End of Session - 11/20/19 1900    Visit Number 8    Number of Visits 13    Date for OT Re-Evaluation 11/24/19    Authorization Type [facility]    Authorization - Visit Number 8    Authorization - Number of Visits 15    OT Start Time 1027    OT Stop Time 1830    OT Time Calculation (min) 45 min    Activity Tolerance Patient tolerated treatment well    Behavior During Therapy [facility] for tasks assessed/performed           Past Medical History:  Diagnosis Date  . Atrial fibrillation ([place])   . BPH (benign prostatic hyperplasia)   . CKD (chronic kidney disease)   . History of coronary angioplasty with insertion of stent   . Hypertension   . Severe sepsis ([place]) 11/04/2019  . Stroke [facility])     Past Surgical History:  Procedure Laterality Date  . COLECTOMY  2006   cancer    There were no vitals filed for this visit.   Subjective Assessment - 11/20/19 1854    Subjective  They put me in the guillotine- patient said smiling after using body weight support for walking    Patient is accompanied by: Family member    Currently in Pain? No/denies    Pain Score 0-No pain                        OT Treatments/Exercises (OP) - 11/20/19 0001      Neurological Re-education Exercises   Other Exercises 1 Neuromuscular reeducation to address functional transfers and transitions from sit to partial stand/ full stand.  Patient very surface dependent, and without UE support unable to achieve stand - too fearful, actively pulling into seated position  With Left  UE on surface, able to stand with min assist and reach to left side and discriminate  between field of two accurately.  Patient very fearful of falling, yet is progressing with sit to stand transition.  In sitting worked on [place] on mobile surface to encourage activation of shoulder and elbow.  Needed facilitation to weight shift toward right side - strong left preference, and needed frequent cues to visually attend to right arm to aide with activation.        Splinting   Splinting Prior custom resting hand splint started, but not completed after discussion with daughter.  Patient with fragile skin, and he currently has a resting hand prefab splint which is padded - and has not caused skin breakdown.  No need for custom brace at night at this time.  Daughter in agreement.                     OT Short Term Goals - 11/20/19 1901      OT SHORT TERM GOAL #1   Title Patient will complete lower body bathing with max assist due 6/19    Status Not Met      OT SHORT TERM GOAL #2   Title Patient will bend forward while seated to pull pants up thighs    Status Achieved  OT SHORT TERM GOAL #3   Title Patient will transition from sit to stand with mod assist in prep for tolieting on commode    Status Achieved      OT SHORT TERM GOAL #4   Title Patient/caregiver will don/doff custom RUE splint with min cueing    Status Deferred             OT Long Term Goals - 11/20/19 1902      OT LONG TERM GOAL #1   Title Patient will transiton from sit to stand with mod assist to allow toileting, clothing management, and hygiene    Status Achieved      OT LONG TERM GOAL #2   Title Patient/caregiver will complete a home exercise program to prevent pain, and improve passive motion in RUE to aide with hygiene and dressing tasks    Status On-going      OT LONG TERM GOAL #3   Title Patient / caregiver will demonstrate effective edema management techniques for right hand, and support for right arm to prevent further subluxation    Status On-going      OT LONG TERM GOAL #4    Title Patient will attend to right arm, and manage right arm during transitional movements and in wheelchair with min cueing    Status On-going                 Plan - 11/20/19 1901    Clinical Impression Statement Patient with significant perceptual and attentional deficits, but is participating in therapy with encouragement.    OT Frequency 2x / week    OT Duration 6 weeks    OT Treatment/Interventions Self-care/ADL training;Electrical Stimulation;Therapeutic exercise;Visual/perceptual remediation/compensation;Patient/family education;Splinting;Neuromuscular education;Therapeutic activities;Balance training;Cognitive remediation/compensation;Passive range of motion;Manual Therapy;DME and/or AE instruction    Plan Goal check- Postural control, sitting balance, sit to lift off, weight shift right, lower body ADL weight shifts, NMR RUE    OT Home Exercise Plan SLiding right hand on thigh, tilted stool    Consulted and Agree with Plan of Care Patient;Family member/caregiver    Family Member Consulted dtr [name]           Patient will benefit from skilled therapeutic intervention in order to improve the following deficits and impairments:           Visit Diagnosis: Hemiplegia and hemiparesis following cerebral infarction affecting right dominant side (HCC)  Unsteadiness on feet  Muscle weakness (generalized)  Attention and concentration deficit  Neurologic neglect syndrome  Stiffness of right shoulder, not elsewhere classified  Localized edema    Problem List Patient Active Problem List   Diagnosis Date Noted  . Chronic respiratory failure with hypoxia ([place]) 11/07/2019  . Pressure injury of skin 11/05/2019  . Acute renal failure superimposed on stage 4 chronic kidney disease ([place]) 11/04/2019  . Right hemiparesis ([place]) 11/04/2019  . Atrial fibrillation, chronic ([place]) 11/04/2019  . Severe sepsis ([place]) 11/04/2019  . CAP (community acquired pneumonia) 11/04/2019  .  Dysphagia 11/04/2019  . Sepsis secondary to UTI ([place]) 11/03/2019  . 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 CKD (chronic kidney disease) stage 4, GFR 15-29 ml/min (HCC) 09/29/2019  . Thrombocytopenia ([place]) 09/29/2019  . Personal history of DVT (deep vein thrombosis) 09/09/2019  . History of ischemic stroke 09/09/2019  . CAD (coronary artery disease) 09/09/2019  . Chronic constipation 09/09/2019  . History of colorectal cancer 09/09/2019  . 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 Right arm  weakness 09/09/2019  . Right leg weakness 09/09/2019  . 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 [place], [place], [address] Phone: [phone]   Fax:  [phone]  Name: Katsumi Wisler MRN: [id number] Date of Birth: 1922-04-02

## 2019-11-24 ENCOUNTER — Encounter: Payer: Self-pay | Admitting: Occupational Therapy

## 2019-11-24 ENCOUNTER — Other Ambulatory Visit: Payer: Self-pay

## 2019-11-24 ENCOUNTER — Ambulatory Visit: Payer: No Typology Code available for payment source | Admitting: Occupational Therapy

## 2019-11-24 ENCOUNTER — Ambulatory Visit: Payer: No Typology Code available for payment source

## 2019-11-24 DIAGNOSIS — R2681 Unsteadiness on feet: Secondary | ICD-10-CM

## 2019-11-24 DIAGNOSIS — I69351 Hemiplegia and hemiparesis following cerebral infarction affecting right dominant side: Secondary | ICD-10-CM

## 2019-11-24 DIAGNOSIS — R414 Neurologic neglect syndrome: Secondary | ICD-10-CM

## 2019-11-24 DIAGNOSIS — M6281 Muscle weakness (generalized): Secondary | ICD-10-CM

## 2019-11-24 DIAGNOSIS — R41840 Attention and concentration deficit: Secondary | ICD-10-CM

## 2019-11-24 DIAGNOSIS — M25611 Stiffness of right shoulder, not elsewhere classified: Secondary | ICD-10-CM

## 2019-11-24 DIAGNOSIS — R600 Localized edema: Secondary | ICD-10-CM

## 2019-11-24 NOTE — Progress Notes (Signed)
Cardiology Clinic Note   Patient Name: Robert Mcconnell Date of Encounter: 11/25/2019  Primary Care Provider:  [name], MD Primary Cardiologist:  Buford Dresser, MD  Patient Profile    Robert Mcconnell 86 year old male presents to the clinic today for follow-up evaluation of his CAD, and atrial fibrillation.  Past Medical History    Past Medical History:  Diagnosis Date   Atrial fibrillation (HCC)    BPH (benign prostatic hyperplasia)    CKD (chronic kidney disease)    History of coronary angioplasty with insertion of stent    Hypertension    Severe sepsis ([place]) 11/04/2019   Stroke [facility])    Past Surgical History:  Procedure Laterality Date   COLECTOMY  2006   cancer    Allergies  No Known Allergies  History of Present Illness    [name] has a PMH of CAD, atrial fibrillation, community-acquired pneumonia, chronic respiratory failure with hypoxia, dysphagia related to CVA, chronic constipation, right hemiparesis, CKD stage IV, severe sepsis, and thrombocytopenia.  Is undergone remote PCI 10-15 years ago to an unknown vessel.  He was last seen by Dr. Harrell Gave while admitted to the hospital 11/07/2019.  He was noted to have atrial fibrillation which was rate controlled with frequent PVCs.  His echocardiogram 11/04/2019 showed an LVEF of 30-35%.  Diastolic dysfunction could not be evaluated.  Mildly reduced RV function, left atrium severely dilated, right atria severely dilated, mild to moderate tricuspid regurgitation, trivial aortic valve regurgitation, and mild ascending aortic dilation.  He was seen in consultation for his CHF at the request of [place].  Ischemic cardiomyopathy was suspected however no plans for cardiac catheterization was made due to his comorbidities, family and patient preferences. ACEi/ARB/ARNI were not added due to CKD and hydralazine/Imdur were not added due to intermittent hypotension.  A low-dose metoprolol succinate was  added to see if there would be benefit to his ectopy.  He presents to the clinic today for follow-up evaluation with his daughter and she states he is doing well.  He has been completing 3 days of physical therapy per week.  She has been monitoring his vital signs, and breathing.  She states that his blood pressure has been stable in 115-120/70 range.  EKG today shows less ectopy and a rate of 53.  She states that she is a paramedic and is frustrated that she missed her dad's sepsis.  She has also noticed that since his CVA he responds better to his native [place] language versus [place].  I will have him continue with her current medication regimen, physical therapy, and have him follow-up with Dr. Harrell Gave in 3 months.  Today he denies chest pain, shortness of breath, lower extremity edema, fatigue, palpitations, melena, hematuria, hemoptysis, diaphoresis, weakness, presyncope, syncope, orthopnea, and PND.   Home Medications    Prior to Admission medications   Medication Sig Start Date End Date Taking? Authorizing Provider  apixaban (ELIQUIS) 2.5 MG TABS tablet Take 2.5 mg by mouth 2 (two) times daily.     [provider]  atorvastatin (LIPITOR) 40 MG tablet Take 40 mg by mouth at bedtime.     [provider]  calcitRIOL (ROCALTROL) 1 MCG/ML solution Take 0.25 mcg by mouth every Monday, Wednesday, and Friday.     [provider]  Docusate Sodium 150 MG/15ML syrup Take 150 mg by mouth daily.     [provider]  famotidine (PEPCID) 20 MG tablet Take 20 mg by mouth daily.  [provider]  finasteride (PROSCAR) 5 MG tablet Take 5 mg by mouth daily.    [provider]  melatonin 3 MG TABS tablet Take 9 mg by mouth at bedtime.    [provider]  metoprolol succinate (TOPROL-XL) 25 MG 24 hr tablet Take 0.5 tablets (12.5 mg total) by mouth daily. 11/07/19   Regalado, Belkys A, MD  mirtazapine (REMERON) 15 MG tablet Take 15 mg by  mouth at bedtime.    [provider]  senna (SENOKOT) 8.6 MG TABS tablet Take 1 tablet by mouth daily.    [provider]  terazosin (HYTRIN) 5 MG capsule Take 5 mg by mouth at bedtime.    [provider]  Zinc Sulfate (ZINC 15 PO) Take 15 mg by mouth 2 (two) times daily.    [provider]    Family History    History reviewed. No pertinent family history. has no family status information on file.   Social History    Social History   Socioeconomic History   Marital status: Married    Spouse name: Not on file   Number of children: Not on file   Years of education: Not on file   Highest education level: Not on file  Occupational History   Occupation: retired  Tobacco Use   Smoking status: Never Smoker   Smokeless tobacco: Never Used  Substance and Sexual Activity   Alcohol use: Yes    Comment: occasionally   Drug use: Never   Sexual activity: Not Currently    Partners: Female  Other Topics Concern   Not on file  Social History Narrative   Not on file   Social Determinants of Health   Financial Resource Strain:    Difficulty of Paying Living Expenses:   Food Insecurity:    Worried About Charity fundraiser in the Last Year:    Arboriculturist in the Last Year:   Transportation Needs:    Film/video editor (Medical):    Lack of Transportation (Non-Medical):   Physical Activity:    Days of Exercise per Week:    Minutes of Exercise per Session:   Stress:    Feeling of Stress :   Social Connections:    Frequency of Communication with Friends and Family:    Frequency of Social Gatherings with Friends and Family:    Attends Religious Services:    Active Member of Clubs or Organizations:    Attends Music therapist:    Marital Status:   Intimate Partner Violence:    Fear of Current or Ex-Partner:    Emotionally Abused:    Physically Abused:    Sexually Abused:      Review of  Systems    General:  No chills, fever, night sweats or weight changes.  Cardiovascular:  No chest pain, dyspnea on exertion, edema, orthopnea, palpitations, paroxysmal nocturnal dyspnea. Dermatological: No rash, lesions/masses Respiratory: No cough, dyspnea Urologic: No hematuria, dysuria Abdominal:   No nausea, vomiting, diarrhea, bright red blood per rectum, melena, or hematemesis Neurologic:  No visual changes, wkns, changes in mental status. All other systems reviewed and are otherwise negative except as noted above.  Physical Exam    VS:  BP 120/72 (BP Location: Left Arm, Patient Position: Sitting, Cuff Size: Normal)    Pulse (!) 53    Ht 5\' 8"  (1.727 m)    Wt 170 lb (77.1 kg)    BMI 25.85 kg/m  , BMI Body  mass index is 25.85 kg/m. GEN: Well nourished, well developed, in no acute distress. HEENT: normal. Neck: Supple, no JVD, carotid bruits, or masses. Cardiac: RRR, no murmurs, rubs, or gallops. No clubbing, cyanosis, generalized right lower extremity edema.  Radials/DP/PT 2+ and equal bilaterally.  Respiratory:  Respirations regular and unlabored, clear to auscultation bilaterally. GI: Soft, nontender, nondistended, BS + x 4. MS: no deformity or atrophy. Skin: warm and dry, no rash. Neuro:  Strength and sensation are intact. Psych: Normal affect.  Accessory Clinical Findings    Recent Labs: 11/03/2019: TSH 3.379 11/06/2019: ALT 19; Hemoglobin 10.5; Magnesium 1.9; Platelets 80 11/07/2019: BUN 29; Creatinine, Ser 1.67; Potassium 3.7; Sodium 144   Recent Lipid Panel    Component Value Date/Time   CHOL 98 11/05/2019 0303   TRIG 79 11/05/2019 0303   HDL 28 (L) 11/05/2019 0303   CHOLHDL 3.5 11/05/2019 0303   VLDL 16 11/05/2019 0303   LDLCALC 54 11/05/2019 0303    ECG personally reviewed by me today-atrial fibrillation with slow ventricular response wide QRS rhythm with occasional premature ventricular complexes and fusion complexes 53 bpm.  EKG 11/03/2019 Atrial fibrillation  with slow ventricular response with ventricular escape complexes 47 bpm  Echocardiogram 11/04/2019  IMPRESSIONS    1. Left ventricular ejection fraction, by estimation, is 30 to 35%. The  left ventricle has moderate to severely decreased function. The left  ventricle demonstrates regional wall motion abnormalities (see scoring  diagram/findings for description). The  left ventricular internal cavity size was mildly dilated. Left ventricular  diastolic function could not be evaluated. There is severe hypokinesis of  the left ventricular, entire inferior wall, inferolateral wall and  inferoseptal wall.  2. Right ventricular systolic function is mildly reduced. The right  ventricular size is normal. There is moderately elevated pulmonary artery  systolic pressure.  3. Left atrial size was severely dilated.  4. Right atrial size was severely dilated.  5. The mitral valve is normal in structure. Mild to moderate mitral valve  regurgitation.  6. Tricuspid valve regurgitation is mild to moderate.  7. The aortic valve is tricuspid. Aortic valve regurgitation is trivial.  Mild to moderate aortic valve sclerosis/calcification is present, without  any evidence of aortic stenosis.  8. Aortic dilatation noted. There is mild dilatation of the ascending  aorta measuring 44 mm.  9. The inferior vena cava is dilated in size with <50% respiratory  variability, suggesting right atrial pressure of 15 mmHg.  Assessment & Plan   1.  Acute combined systolic and diastolic heart failure-no increased work of breathing today or DOE.  Echocardiogram 11/04/2019 showed LVEF of 30-35%, mildly reduced RV systolic function, severe biatrial enlargement and mild to moderate MR/TR.  During his admission 11/07/2019 he was noted to have significant ectopy and metoprolol succinate was added.  No PCI was planned due to comorbidities, family and patient preferences Continue metoprolol, Heart healthy low-sodium  diet-salty 6 given Increase physical activity as tolerated  Coronary artery disease-no chest pain.  Remote PCI of unknown vessel 10-15 years ago. Continue apixaban (no aspirin), atorvastatin, metoprolol succinate Heart healthy low-sodium diet-salty 6 given Increase physical activity as tolerated  Persistent atrial fibrillation-EKG today shows atrial fibrillation with slow ventricular response wide-complex rhythm with occasional premature ventricular complexes and fusion complexes 53 bpm left axis deviation..  Previously rate controlled without beta blocking agents. Continue metoprolol succinate, apixaban Heart healthy low-sodium diet-salty 6 given Increase physical activity as tolerated  CKD stage IV-creatinine 1.67 on 11/07/2019 Monitored by PCP  Disposition: Follow-up with Dr. Harrell Gave in 3 months.   Jossie Ng. Tawfiq Favila NP-C    11/25/2019, 11:23 AM [place] [address] Office [phone] Fax [phone]

## 2019-11-24 NOTE — Therapy (Signed)
[facility] [address] [place], [place], [address] Phone: [phone]   Fax:  [phone]  Occupational Therapy Treatment  Patient Details  Name: Robert Mcconnell MRN: [id number] Date of Birth: 10-14-1921 Referring Provider (OT): Eliezer Lofts   Encounter Date: 11/24/2019   OT End of Session - 11/24/19 1845    Visit Number 9    Number of Visits 13    Date for OT Re-Evaluation 11/24/19    Authorization Type VA    Authorization - Visit Number 9    Authorization - Number of Visits 15    OT Start Time 1700    OT Stop Time 1745    OT Time Calculation (min) 45 min    Activity Tolerance Patient tolerated treatment well    Behavior During Therapy [facility] for tasks assessed/performed           Past Medical History:  Diagnosis Date  . Atrial fibrillation ([place])   . BPH (benign prostatic hyperplasia)   . CKD (chronic kidney disease)   . History of coronary angioplasty with insertion of stent   . Hypertension   . Severe sepsis ([place]) 11/04/2019  . Stroke [facility])     Past Surgical History:  Procedure Laterality Date  . COLECTOMY  2006   cancer    There were no vitals filed for this visit.   Subjective Assessment - 11/24/19 1704    Subjective  They hang me by my neck    Patient is accompanied by: Family member    Currently in Pain? No/denies    Pain Score 0-No pain                        OT Treatments/Exercises (OP) - 11/24/19 0001      ADLs   Functional Mobility Reviewed ADL goals with patient and daughter.  Patient really wants to shower and toilet with less assistance.  Worked on transitioning from sit to stand using bar to pull up with left hand.  Patient able to consistently  stand with mod assist initially, and on occasion with min assist.  Once in standing worked to decrease use of upper extremity for stand balance.  Worked on beginning dynamic standing with active weight shift toward left side,  reaching toward targets.                  OT Education - 11/24/19 1844    Education Details Benefit of practice of sit to stand and stand balance as related to ADL goals    Person(s) Educated Patient;Child(ren)    Methods Explanation;Demonstration    Comprehension Verbalized understanding;Need further instruction;Returned demonstration            OT Short Term Goals - 11/24/19 1705      OT SHORT TERM GOAL #1   Title Patient will complete lower body bathing with max assist due 6/19    Status Achieved      OT SHORT TERM GOAL #2   Title Patient will bend forward while seated to pull pants up thighs    Status Achieved      OT SHORT TERM GOAL #3   Title Patient will transition from sit to stand with mod assist in prep for tolieting on commode    Status Achieved      OT SHORT TERM GOAL #4   Title Patient/caregiver will don/doff custom RUE splint with min cueing    Status Deferred  OT Long Term Goals - 11/24/19 1706      OT LONG TERM GOAL #1   Title Patient will transiton from sit to stand with mod assist to allow toileting, clothing management, and hygiene    Status On-going      OT LONG TERM GOAL #2   Title Patient/caregiver will complete a home exercise program to prevent pain, and improve passive motion in RUE to aide with hygiene and dressing tasks    Status On-going      OT LONG TERM GOAL #4   Title Patient will attend to right arm, and manage right arm during transitional movements and in wheelchair with min cueing    Status On-going                 Plan - 11/24/19 1845    Clinical Impression Statement Patient is showing progress toward several of his short term goals.  Patient is showing improved transitional movement ability, and improved sit to stand.    OT Frequency 2x / week    OT Duration 6 weeks    OT Treatment/Interventions Self-care/ADL training;Electrical Stimulation;Therapeutic exercise;Visual/perceptual  remediation/compensation;Patient/family education;Splinting;Neuromuscular education;Therapeutic activities;Balance training;Cognitive remediation/compensation;Passive range of motion;Manual Therapy;DME and/or AE instruction    Plan Postural control, sitting balance, sit to lift off, weight shift right, lower body ADL weight shifts, NMR RUE    OT Home Exercise Plan SLiding right hand on thigh, tilted stool    Consulted and Agree with Plan of Care Patient;Family member/caregiver    Family Member Consulted dtr [name]           Patient will benefit from skilled therapeutic intervention in order to improve the following deficits and impairments:           Visit Diagnosis: Hemiplegia and hemiparesis following cerebral infarction affecting right dominant side (HCC)  Unsteadiness on feet  Muscle weakness (generalized)  Attention and concentration deficit  Neurologic neglect syndrome  Stiffness of right shoulder, not elsewhere classified  Localized edema    Problem List Patient Active Problem List   Diagnosis Date Noted  . Chronic respiratory failure with hypoxia ([place]) 11/07/2019  . Pressure injury of skin 11/05/2019  . Acute renal failure superimposed on stage 4 chronic kidney disease ([place]) 11/04/2019  . Right hemiparesis ([place]) 11/04/2019  . Atrial fibrillation, chronic ([place]) 11/04/2019  . Severe sepsis ([place]) 11/04/2019  . CAP (community acquired pneumonia) 11/04/2019  . Dysphagia 11/04/2019  . Sepsis secondary to UTI ([place]) 11/03/2019  . 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 CKD (chronic kidney disease) stage 4, GFR 15-29 ml/min (HCC) 09/29/2019  . Thrombocytopenia ([place]) 09/29/2019  . Personal history of DVT (deep vein thrombosis) 09/09/2019  . History of ischemic stroke 09/09/2019  . CAD (coronary artery disease) 09/09/2019  . Chronic constipation 09/09/2019  . History of colorectal cancer 09/09/2019  . Facial paralysis on right side  09/09/2019  . Right arm weakness 09/09/2019  . Right leg weakness 09/09/2019  . 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 [place], [place], [address] Phone: [phone]   Fax:  [phone]  Name: Robert Mcconnell MRN: [id number] Date of Birth: 06-07-1921

## 2019-11-24 NOTE — Therapy (Signed)
[facility] [address] [place], [place], [address] Phone: [phone]   Fax:  [phone]  Physical Therapy Treatment  Patient Details  Name: Robert Mcconnell MRN: [id number] Date of Birth: 04-04-1922 Referring Provider (PT): Arnell Sieving   Encounter Date: 11/24/2019   PT End of Session - 11/24/19 1852    Visit Number 20    Number of Visits 32    Date for PT Re-Evaluation 12/25/19    Authorization Type VA    Authorization Time Period 15 visits from 7/8-11/5    Authorization - Visit Number 4    Authorization - Number of Visits 15    Progress Note Due on Visit 15    PT Start Time 7902    PT Stop Time 1700    PT Time Calculation (min) 45 min    Equipment Utilized During Treatment Gait belt    Activity Tolerance Patient tolerated treatment well    Behavior During Therapy [facility] for tasks assessed/performed           Past Medical History:  Diagnosis Date  . Atrial fibrillation ([place])   . BPH (benign prostatic hyperplasia)   . CKD (chronic kidney disease)   . History of coronary angioplasty with insertion of stent   . Hypertension   . Severe sepsis ([place]) 11/04/2019  . Stroke [facility])     Past Surgical History:  Procedure Laterality Date  . COLECTOMY  2006   cancer    There were no vitals filed for this visit.   Subjective Assessment - 11/24/19 1843    Subjective Pt reports no new complaints    Patient is accompained by: Family member    Pertinent History CVA    Limitations Standing;Walking;House hold activities;Sitting    How long can you sit comfortably? 1-2 hours    How long can you stand comfortably? <5 min    How long can you walk comfortably? unable    Patient Stated Goals walk    Currently in Pain? No/denies                    Gait training: wit biodex harness with 60-100lb unweighted, 25 feet, 35 feet, 30 feet Max A x 2 (one person shifting weight and moving LE during swing  phase) other person guiding the harness system                       PT Short Term Goals - 10/30/19 1809      PT SHORT TERM GOAL #1   Title Patient will be able to perform sit to stand with mod A with or without AD to improve independence and reduce care giver burden    Baseline max A (eval); mod to max A (10/30/19)    Time 4    Period Weeks    Status On-going    Target Date 11/27/19      PT SHORT TERM GOAL #2   Title pt will be able to stand with mod A for 5 min to perform small reaching with L UE to improve standing balance.    Baseline able to stand with holding on to parallel bars with L UE but needs stabilization at knees to improve standing tolerance (max A required overall) is able to reach with max A    Time 4    Period Weeks    Status On-going    Target Date 11/27/19      PT SHORT  TERM GOAL #3   Title Pt will be able to propel chair with SBA  for 30 feet to improve household navigation    Baseline Able to propel chair with use of L UE and L LE with mod A for 30 feet (10/30/19)    Time 4    Period Weeks    Status Achieved    Target Date 10/06/19             PT Long Term Goals - 10/30/19 1812      PT LONG TERM GOAL #1   Title Patient will be able to stand for 5 min with CGA to improve standing endurance with or without AD    Baseline requires max A    Time 8    Period Weeks    Status On-going    Target Date 12/25/19      PT LONG TERM GOAL #2   Title Pt will require CGA with sit to stand and chair to chair transfers to improve indepdnence and reduce caregiver burden.    Baseline max to total A    Time 8    Period Weeks    Status On-going    Target Date 12/25/19      PT LONG TERM GOAL #3   Title Patient will be able to walk into walk-in shower and sit on shower chair with CGA to improve independence.    Baseline bed sponge bath only    Time 8    Period Weeks    Status On-going    Target Date 12/25/19      PT LONG TERM GOAL #4   Title  Pt will demo >10/56 on BBS to improve static balance and reduce fall risk    Baseline BBS 4/56 (eval)    Time 8    Period Weeks    Status On-going    Target Date 12/25/19                 Plan - 11/24/19 1850    Clinical Impression Statement Today's session was focused on gait training using body weight supported biodex system. Patient demonstrated ambulation 25 feet, 35 feet and 30 feet with mad assist to weight shift and swing R LE forward. About 60-100 lb was supported by biodex system throughout the session.    Personal Factors and Comorbidities Age;Comorbidity 3+    Comorbidities hx of stroke, hx of colorectal cancer    Examination-Activity Limitations Bathing;Bed Mobility;Caring for Others;Carry;Locomotion Level;Hygiene/Grooming;Dressing;Continence;Self Feeding;Reach Overhead;Sit;Lift;Other;Transfers;Toileting;Stand;Stairs    Examination-Participation Restrictions Church;Cleaning;Community Activity;Driving;Meal Prep;Volunteer;Shop;Medication Management;Laundry;Personal Finances;Yard Work;Interpersonal Relationship    Stability/Clinical Decision Making Unstable/Unpredictable    Rehab Potential Good    PT Frequency 2x / week    PT Duration 8 weeks    PT Treatment/Interventions ADLs/Self Care Home Management;Electrical Stimulation;Gait training;Stair training;Functional mobility training;Therapeutic exercise;Balance training;Neuromuscular re-education;Therapeutic activities;Patient/family education;Wheelchair mobility training;Manual techniques;Passive range of motion    PT Next Visit Plan continue to work on sit to stand transfers, chair to chair transfers, wheelchair mobility           Patient will benefit from skilled therapeutic intervention in order to improve the following deficits and impairments:  Abnormal gait, Decreased activity tolerance, Decreased coordination, Decreased safety awareness, Decreased strength, Impaired flexibility, Impaired UE functional use, Postural  dysfunction, Impaired tone, Increased edema, Decreased range of motion, Decreased knowledge of precautions, Impaired sensation, Difficulty walking, Decreased mobility, Decreased endurance, Decreased balance  Visit Diagnosis: Hemiplegia and hemiparesis following cerebral infarction affecting right dominant side (HCC)  Unsteadiness  on feet  Muscle weakness (generalized)     Problem List Patient Active Problem List   Diagnosis Date Noted  . Chronic respiratory failure with hypoxia ([place]) 11/07/2019  . Pressure injury of skin 11/05/2019  . Acute renal failure superimposed on stage 4 chronic kidney disease ([place]) 11/04/2019  . Right hemiparesis ([place]) 11/04/2019  . Atrial fibrillation, chronic ([place]) 11/04/2019  . Severe sepsis ([place]) 11/04/2019  . CAP (community acquired pneumonia) 11/04/2019  . Dysphagia 11/04/2019  . Sepsis secondary to UTI ([place]) 11/03/2019  . 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 CKD (chronic kidney disease) stage 4, GFR 15-29 ml/min (HCC) 09/29/2019  . Thrombocytopenia ([place]) 09/29/2019  . Personal history of DVT (deep vein thrombosis) 09/09/2019  . History of ischemic stroke 09/09/2019  . CAD (coronary artery disease) 09/09/2019  . Chronic constipation 09/09/2019  . History of colorectal cancer 09/09/2019  . 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 Right arm weakness 09/09/2019  . Right leg weakness 09/09/2019  . 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 [place], [place], [address] Phone: [phone]   Fax:  [phone]  Name: Robert Mcconnell MRN: [id number] Date of Birth: Jun 19, 1921

## 2019-11-25 ENCOUNTER — Encounter: Payer: Self-pay | Admitting: General Practice

## 2019-11-25 ENCOUNTER — Ambulatory Visit (INDEPENDENT_AMBULATORY_CARE_PROVIDER_SITE_OTHER): Payer: Medicare Other | Admitting: General Practice

## 2019-11-25 VITALS — BP 120/72 | HR 53 | Ht 68.0 in | Wt 170.0 lb

## 2019-11-25 DIAGNOSIS — I2510 Atherosclerotic heart disease of native coronary artery without angina pectoris: Secondary | ICD-10-CM | POA: Diagnosis not present

## 2019-11-25 DIAGNOSIS — I4819 Other persistent atrial fibrillation: Secondary | ICD-10-CM | POA: Diagnosis not present

## 2019-11-25 DIAGNOSIS — I5041 Acute combined systolic (congestive) and diastolic (congestive) heart failure: Secondary | ICD-10-CM

## 2019-11-25 DIAGNOSIS — N184 Chronic kidney disease, stage 4 (severe): Secondary | ICD-10-CM | POA: Diagnosis not present

## 2019-11-25 NOTE — Patient Instructions (Signed)
Medication Instructions:  The current medical regimen is effective;  continue present plan and medications as directed. Please refer to the Current Medication list given to you today. *If you need a refill on your cardiac medications before your next appointment, please call your pharmacy*  Special Instructions PLEASE READ AND FOLLOW SALTY 6-ATTACHED  PLEASE INCREASE PHYSICAL ACTIVITY AS TOLERATED  Follow-Up: Your next appointment:  3 month(s)  In Person with Buford Dresser, MD  At [facility], you and your health needs are our priority.  As part of our continuing mission to provide you with exceptional heart care, we have created designated Provider Care Teams.  These Care Teams include your primary Cardiologist (physician) and Advanced Practice Providers (APPs -  Physician Assistants and Nurse Practitioners) who all work together to provide you with the care you need, when you need it.  We recommend signing up for the patient portal called "MyChart".  Sign up information is provided on this After Visit Summary.  MyChart is used to connect with patients for Virtual Visits (Telemedicine).  Patients are able to view lab/test results, encounter notes, upcoming appointments, etc.  Non-urgent messages can be sent to your provider as well.   To learn more about what you can do with MyChart, go to [email or web address].

## 2019-11-27 ENCOUNTER — Other Ambulatory Visit: Payer: Self-pay

## 2019-11-27 ENCOUNTER — Ambulatory Visit: Payer: No Typology Code available for payment source | Admitting: Speech Pathology

## 2019-11-27 ENCOUNTER — Encounter: Payer: Self-pay | Admitting: Occupational Therapy

## 2019-11-27 ENCOUNTER — Ambulatory Visit: Payer: No Typology Code available for payment source

## 2019-11-27 ENCOUNTER — Ambulatory Visit: Payer: No Typology Code available for payment source | Admitting: Occupational Therapy

## 2019-11-27 DIAGNOSIS — M25611 Stiffness of right shoulder, not elsewhere classified: Secondary | ICD-10-CM

## 2019-11-27 DIAGNOSIS — I69351 Hemiplegia and hemiparesis following cerebral infarction affecting right dominant side: Secondary | ICD-10-CM

## 2019-11-27 DIAGNOSIS — R600 Localized edema: Secondary | ICD-10-CM

## 2019-11-27 DIAGNOSIS — R471 Dysarthria and anarthria: Secondary | ICD-10-CM

## 2019-11-27 DIAGNOSIS — R41840 Attention and concentration deficit: Secondary | ICD-10-CM

## 2019-11-27 DIAGNOSIS — M6281 Muscle weakness (generalized): Secondary | ICD-10-CM

## 2019-11-27 DIAGNOSIS — R2681 Unsteadiness on feet: Secondary | ICD-10-CM

## 2019-11-27 DIAGNOSIS — R414 Neurologic neglect syndrome: Secondary | ICD-10-CM

## 2019-11-27 NOTE — Patient Instructions (Signed)
Come up with a list of 10 "phone conversation" phrases or short sentences - in [place] that Jerimey would ACTUALLY say on the phone (ask about the [place] community, family, friends, talk about old times, the stroke, etc). Have him repeat them after you (SLOW, LOUD, PAUSE BETWEEN EACH WORD). Twice a day. Bring the list with you next time.

## 2019-11-27 NOTE — Therapy (Signed)
[facility] [address] [place], [place], [address] Phone: [phone]   Fax:  [phone]  Occupational Therapy Treatment  Patient Details  Name: Robert Mcconnell MRN: [id number] Date of Birth: 28-Aug-1921 Referring Provider (OT): Eliezer Lofts   Encounter Date: 11/27/2019   OT End of Session - 11/27/19 1851    Visit Number 10    Number of Visits 13    Date for OT Re-Evaluation 11/24/19    Authorization Type VA    Authorization - Visit Number 10    Authorization - Number of Visits 15    OT Start Time 2248    OT Stop Time 1823    OT Time Calculation (min) 38 min    Activity Tolerance Patient tolerated treatment well    Behavior During Therapy [facility] for tasks assessed/performed           Past Medical History:  Diagnosis Date  . Atrial fibrillation ([place])   . BPH (benign prostatic hyperplasia)   . CKD (chronic kidney disease)   . History of coronary angioplasty with insertion of stent   . Hypertension   . Severe sepsis ([place]) 11/04/2019  . Stroke [facility])     Past Surgical History:  Procedure Laterality Date  . COLECTOMY  2006   cancer    There were no vitals filed for this visit.   Subjective Assessment - 11/27/19 1846    Subjective  You give me hope    Patient is accompanied by: Family member    Currently in Pain? No/denies    Pain Score 0-No pain                        OT Treatments/Exercises (OP) - 11/27/19 0001      ADLs   LB Dressing Daughter reports that patient can don pants in bed, but cannot pull pants up over his hips.  Discussed having him transition out of bed to stand to pull up pants.  Daughter thought they could try this at home.     Toileting Patient very motivated for increased independence with toileting.  Patient currently wears a brief and needs to be transferred to bed to be cleaned.  Working with aptient and primary caregiver, daughter, [name], on increasing length of  time patient able to stand to allow for toilet hygiene.  Worked on simulated stand from commode, pulling up on bedrail for assistance.  Patient able to stand with mod - min assist, but was inconsistent in his ability to stand statically to allow for hygiene.  There were times when patient could stand holding bar unassisted, and times he pushed to the left - requiring max assistance.  Worked on active standing on two legs, and weight shifting toward right side.                    OT Education - 11/27/19 1851    Education Details Transfer from supine in bed to standing to pull up pants for increased independence with dressing    Person(s) Educated Patient;Child(ren)    Methods Explanation    Comprehension Verbalized understanding;Need further instruction            OT Short Term Goals - 11/27/19 1853      OT SHORT TERM GOAL #1   Title Patient will complete lower body bathing with max assist due 6/19      OT SHORT TERM GOAL #2   Title Patient will bend forward  while seated to pull pants up thighs    Status Achieved      OT SHORT TERM GOAL #3   Title Patient will transition from sit to stand with mod assist in prep for tolieting on commode    Status Achieved      OT SHORT TERM GOAL #4   Title Patient/caregiver will don/doff custom RUE splint with min cueing    Status Deferred             OT Long Term Goals - 11/27/19 1854      OT LONG TERM GOAL #1   Title Patient will transiton from sit to stand with mod assist to allow toileting, clothing management, and hygiene    Status On-going      OT LONG TERM GOAL #2   Title Patient/caregiver will complete a home exercise program to prevent pain, and improve passive motion in RUE to aide with hygiene and dressing tasks    Status Achieved      OT LONG TERM GOAL #3   Title Patient / caregiver will demonstrate effective edema management techniques for right hand, and support for right arm to prevent further subluxation     Status Achieved      OT LONG TERM GOAL #4   Title Patient will attend to right arm, and manage right arm during transitional movements and in wheelchair with min cueing    Status On-going                 Plan - 11/27/19 1852    Clinical Impression Statement Patient is more actively involved in all ADL's at this time.  He is still limited by severe perceptual deficits, but has very supportive family working to create 24/7 therapeutic environment.  Patient less fearful of transitional movements,    OT Frequency 2x / week    OT Duration 6 weeks    OT Treatment/Interventions Self-care/ADL training;Electrical Stimulation;Therapeutic exercise;Visual/perceptual remediation/compensation;Patient/family education;Splinting;Neuromuscular education;Therapeutic activities;Balance training;Cognitive remediation/compensation;Passive range of motion;Manual Therapy;DME and/or AE instruction    Plan Postural control, sitting balance, sit to lift off, weight shift right, lower body ADL weight shifts, NMR RUE    Consulted and Agree with Plan of Care Patient;Family member/caregiver           Patient will benefit from skilled therapeutic intervention in order to improve the following deficits and impairments:           Visit Diagnosis: Hemiplegia and hemiparesis following cerebral infarction affecting right dominant side (HCC)  Unsteadiness on feet  Muscle weakness (generalized)  Attention and concentration deficit  Neurologic neglect syndrome  Stiffness of right shoulder, not elsewhere classified  Localized edema    Problem List Patient Active Problem List   Diagnosis Date Noted  . Chronic respiratory failure with hypoxia ([place]) 11/07/2019  . Pressure injury of skin 11/05/2019  . Acute renal failure superimposed on stage 4 chronic kidney disease ([place]) 11/04/2019  . Right hemiparesis ([place]) 11/04/2019  . Atrial fibrillation, chronic ([place]) 11/04/2019  . Severe sepsis ([place]) 11/04/2019   . CAP (community acquired pneumonia) 11/04/2019  . Dysphagia 11/04/2019  . Sepsis secondary to UTI ([place]) 11/03/2019  . 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 CKD (chronic kidney disease) stage 4, GFR 15-29 ml/min (HCC) 09/29/2019  . Thrombocytopenia ([place]) 09/29/2019  . Personal history of DVT (deep vein thrombosis) 09/09/2019  . History of ischemic stroke 09/09/2019  . CAD (coronary artery disease) 09/09/2019  . Chronic constipation 09/09/2019  .  History of colorectal cancer 09/09/2019  . 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 Right arm weakness 09/09/2019  . Right leg weakness 09/09/2019  . 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 [place], [place], [address] Phone: [phone]   Fax:  [phone]  Name: Robert Mcconnell MRN: [id number] Date of Birth: 07/08/1921

## 2019-11-27 NOTE — Therapy (Signed)
[facility] [address] [place], [place], [address] Phone: [phone]   Fax:  [phone]  Physical Therapy Treatment  Patient Details  Name: Robert Mcconnell MRN: [id number] Date of Birth: [birth year] Referring Provider (PT): Arnell Sieving   Encounter Date: 11/27/2019   PT End of Session - 11/27/19 2113    Visit Number 21    Number of Visits 32    Date for PT Re-Evaluation 12/25/19    Authorization Type VA    Authorization Time Period 15 visits from 7/8-11/5    Authorization - Visit Number 5    Authorization - Number of Visits 15    Progress Note Due on Visit 15    PT Start Time 3500    PT Stop Time 1700    PT Time Calculation (min) 45 min    Equipment Utilized During Treatment Gait belt    Activity Tolerance Patient tolerated treatment well    Behavior During Therapy [facility] for tasks assessed/performed           Past Medical History:  Diagnosis Date  . Atrial fibrillation ([place])   . BPH (benign prostatic hyperplasia)   . CKD (chronic kidney disease)   . History of coronary angioplasty with insertion of stent   . Hypertension   . Severe sepsis ([place]) 11/04/2019  . Stroke [facility])     Past Surgical History:  Procedure Laterality Date  . COLECTOMY  2006   cancer    There were no vitals filed for this visit.   Subjective Assessment - 11/27/19 2112    Subjective Pt reports no new complaints    Patient is accompained by: Family member    Pertinent History CVA    Limitations Standing;Walking;House hold activities;Sitting    How long can you sit comfortably? 1-2 hours    How long can you stand comfortably? <5 min    How long can you walk comfortably? unable    Patient Stated Goals walk    Currently in Pain? No/denies                  Gait training: wit biodex harness with 60-60 lb unweighted, 30 feet, 56 feet, 15 feet Max A x 2 (one person shifting weight and moving LE during swing phase)  other person guiding the harness system                          PT Short Term Goals - 10/30/19 1809      PT SHORT TERM GOAL #1   Title Patient will be able to perform sit to stand with mod A with or without AD to improve independence and reduce care giver burden    Baseline max A (eval); mod to max A (10/30/19)    Time 4    Period Weeks    Status On-going    Target Date 11/27/19      PT SHORT TERM GOAL #2   Title pt will be able to stand with mod A for 5 min to perform small reaching with L UE to improve standing balance.    Baseline able to stand with holding on to parallel bars with L UE but needs stabilization at knees to improve standing tolerance (max A required overall) is able to reach with max A    Time 4    Period Weeks    Status On-going    Target Date 11/27/19  PT SHORT TERM GOAL #3   Title Pt will be able to propel chair with SBA  for 30 feet to improve household navigation    Baseline Able to propel chair with use of L UE and L LE with mod A for 30 feet (10/30/19)    Time 4    Period Weeks    Status Achieved    Target Date 10/06/19             PT Long Term Goals - 10/30/19 1812      PT LONG TERM GOAL #1   Title Patient will be able to stand for 5 min with CGA to improve standing endurance with or without AD    Baseline requires max A    Time 8    Period Weeks    Status On-going    Target Date 12/25/19      PT LONG TERM GOAL #2   Title Pt will require CGA with sit to stand and chair to chair transfers to improve indepdnence and reduce caregiver burden.    Baseline max to total A    Time 8    Period Weeks    Status On-going    Target Date 12/25/19      PT LONG TERM GOAL #3   Title Patient will be able to walk into walk-in shower and sit on shower chair with CGA to improve independence.    Baseline bed sponge bath only    Time 8    Period Weeks    Status On-going    Target Date 12/25/19      PT LONG TERM GOAL #4   Title  Pt will demo >10/56 on BBS to improve static balance and reduce fall risk    Baseline BBS 4/56 (eval)    Time 8    Period Weeks    Status On-going    Target Date 12/25/19                 Plan - 11/27/19 2112    Clinical Impression Statement Pt demonstrated improved walking distance with lesser weight unweighted today. pt was able to ambulate with about 40-60lbs of weight unloaded with biodex compared to 60-100lb during last session.    Personal Factors and Comorbidities Age;Comorbidity 3+    Comorbidities hx of stroke, hx of colorectal cancer    Examination-Activity Limitations Bathing;Bed Mobility;Caring for Others;Carry;Locomotion Level;Hygiene/Grooming;Dressing;Continence;Self Feeding;Reach Overhead;Sit;Lift;Other;Transfers;Toileting;Stand;Stairs    Examination-Participation Restrictions Church;Cleaning;Community Activity;Driving;Meal Prep;Volunteer;Shop;Medication Management;Laundry;Personal Finances;Yard Work;Interpersonal Relationship    Stability/Clinical Decision Making Unstable/Unpredictable    Rehab Potential Good    PT Frequency 2x / week    PT Duration 8 weeks    PT Treatment/Interventions ADLs/Self Care Home Management;Electrical Stimulation;Gait training;Stair training;Functional mobility training;Therapeutic exercise;Balance training;Neuromuscular re-education;Therapeutic activities;Patient/family education;Wheelchair mobility training;Manual techniques;Passive range of motion    PT Next Visit Plan continue to work on sit to stand transfers, chair to chair transfers, wheelchair mobility           Patient will benefit from skilled therapeutic intervention in order to improve the following deficits and impairments:  Abnormal gait, Decreased activity tolerance, Decreased coordination, Decreased safety awareness, Decreased strength, Impaired flexibility, Impaired UE functional use, Postural dysfunction, Impaired tone, Increased edema, Decreased range of motion, Decreased  knowledge of precautions, Impaired sensation, Difficulty walking, Decreased mobility, Decreased endurance, Decreased balance  Visit Diagnosis: Hemiplegia and hemiparesis following cerebral infarction affecting right dominant side (HCC)  Unsteadiness on feet  Muscle weakness (generalized)     Problem List Patient Active  Problem List   Diagnosis Date Noted  . Chronic respiratory failure with hypoxia ([place]) 11/07/2019  . Pressure injury of skin 11/05/2019  . Acute renal failure superimposed on stage 4 chronic kidney disease ([place]) 11/04/2019  . Right hemiparesis ([place]) 11/04/2019  . Atrial fibrillation, chronic ([place]) 11/04/2019  . Severe sepsis ([place]) 11/04/2019  . CAP (community acquired pneumonia) 11/04/2019  . Dysphagia 11/04/2019  . Sepsis secondary to UTI ([place]) 11/03/2019  . 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 CKD (chronic kidney disease) stage 4, GFR 15-29 ml/min (HCC) 09/29/2019  . Thrombocytopenia ([place]) 09/29/2019  . Personal history of DVT (deep vein thrombosis) 09/09/2019  . History of ischemic stroke 09/09/2019  . CAD (coronary artery disease) 09/09/2019  . Chronic constipation 09/09/2019  . History of colorectal cancer 09/09/2019  . 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 Right arm weakness 09/09/2019  . Right leg weakness 09/09/2019  . 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

## 2019-11-28 NOTE — Therapy (Signed)
[facility] [address] [place], [place], [address] Phone: [phone]   Fax:  [phone]  Speech Language Pathology Treatment  Patient Details  Name: Robert Mcconnell MRN: [id number] Date of Birth: July 29, 1921 Referring Provider (SLP): Wynema Birch, [place]   Encounter Date: 11/27/2019   End of Session - 11/28/19 1002    Visit Number 6    Number of Visits 15    Date for SLP Re-Evaluation 12/05/19    Authorization - Visit Number 6    Authorization - Number of Visits 15    SLP Start Time 8299    SLP Stop Time  3716    SLP Time Calculation (min) 41 min    Activity Tolerance Patient tolerated treatment well;Other (comment)   decreased sustained attention          Past Medical History:  Diagnosis Date  . Atrial fibrillation ([place])   . BPH (benign prostatic hyperplasia)   . CKD (chronic kidney disease)   . History of coronary angioplasty with insertion of stent   . Hypertension   . Severe sepsis ([place]) 11/04/2019  . Stroke [facility])     Past Surgical History:  Procedure Laterality Date  . COLECTOMY  2006   cancer    There were no vitals filed for this visit.   Subjective Assessment - 11/28/19 0957    Subjective "He wants to talk to his friends on the phone but they can't understand him."    Patient is accompained by: Family member   daughter [name]   Currently in Pain? No/denies                 ADULT SLP TREATMENT - 11/28/19 0957      General Information   Behavior/Cognition Requires cueing;Decreased sustained attention;Alert      Treatment Provided   Treatment provided Cognitive-Linquistic      Cognitive-Linquistic Treatment   Treatment focused on Dysarthria;Patient/family/caregiver education    Skilled Treatment Daughter reports she and pt's wife continue to understand him at home. She reports pt frustration when trying to talk to friends in [place] at home. Patient required usual mod-max cues to  increase vocal intensity and when SLP attempted to have pt slow rate by modeling/imitiation, pt required max cues. For brief periods in initial part of session, pt responded appropriately in simple communication exchanges, however as session required max cues for off-topic responses. SLP requested daughter work with pt/family at home to ID "functional" phrases in [place] pt can use on the phone with friends and add these to daily practice. Daughter in agreement next session will be last for ST.       Assessment / Recommendations / Plan   Plan Continue with current plan of care      Progression Toward Goals   Progression toward goals Progressing toward goals            SLP Education - 11/28/19 1002    Education Details practicing functional communication at home    Person(s) Educated Patient;Child(ren)    Methods Explanation;Demonstration;Verbal cues;Handout    Comprehension Verbalized understanding;Need further instruction            SLP Short Term Goals - 11/28/19 1003      SLP SHORT TERM GOAL #1   Title pt will demo sustained attention for a 5- minute familiar task x5, in 3 sessions    Time 3    Period Weeks    Status Deferred      SLP SHORT  TERM GOAL #2   Title pt will demo knowledge of a memory book when asked pertinent questions dealing with memory in 3 sessions    Time 3    Period Weeks    Status Deferred      SLP SHORT TERM GOAL #3   Title Pt will complete dysarthria HEP with usual mod cues from caregiver x3 sessions.    Time 2    Period Weeks    Status On-going            SLP Long Term Goals - 11/28/19 1003      SLP LONG TERM GOAL #1   Title pt will demo sustained attention for 3 10 minute therapy tasks in 3 sessions    Time 7    Period Weeks   or 15 total visits, for all LTGs   Status Deferred      SLP LONG TERM GOAL #2   Title pt will engage with memory notebook/planner, WNL procedure, with min nonverbal cues to do so, in 3 sessions    Time 7     Period Weeks    Status Deferred      SLP LONG TERM GOAL #3   Title Pt/family will tell SLP 3 signs of aspiration PNA.    Time 7    Period Weeks    Status Achieved      SLP LONG TERM GOAL #4   Title Caregiver will demonstrate appropriate cuing when speech is unintelligible.    Time 2    Period Weeks    Status On-going            Plan - 11/28/19 1003    Clinical Impression Statement Pt presents with significant deficits in cognitive linguistics incluing attention, memory, awareness. Pt with multi-factorial sx making improvement in speech and language difficult but SLP; attention today was adequate for pt to participate in simple functional speech tasks with mod-max cues. Pt presentation similar to eval however daughter reports improved understanding of pt communicating at home. As she expresses desire to focus on PT and OT, daughter in agreement that 1-2 more sessions for ST would be appropriate to focus on caregiver education in cuing strategies to maximize pt's intelligibility at home.    Speech Therapy Frequency 2x / week    Duration --   7 weeks (or 15 visits - VA auth)   Treatment/Interventions Environmental controls;Oral motor exercises;Functional tasks;Multimodal communcation approach;Cueing hierarchy;Trials of upgraded texture/liquids;Diet toleration management by SLP;SLP instruction and feedback;Language facilitation;Cognitive reorganization;Compensatory strategies;Patient/family education;Internal/external aids    Potential to Achieve Goals Fair    Potential Considerations Severity of impairments    Consulted and Agree with Plan of Care Patient;Family member/caregiver    Family Member Consulted son in law           Patient will benefit from skilled therapeutic intervention in order to improve the following deficits and impairments:   Dysarthria and anarthria    Problem List Patient Active Problem List   Diagnosis Date Noted  . Chronic respiratory failure with hypoxia  ([place]) 11/07/2019  . Pressure injury of skin 11/05/2019  . Acute renal failure superimposed on stage 4 chronic kidney disease ([place]) 11/04/2019  . Right hemiparesis ([place]) 11/04/2019  . Atrial fibrillation, chronic ([place]) 11/04/2019  . Severe sepsis ([place]) 11/04/2019  . CAP (community acquired pneumonia) 11/04/2019  . Dysphagia 11/04/2019  . Sepsis secondary to UTI ([place]) 11/03/2019  . 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 CKD (chronic kidney disease)  stage 4, GFR 15-29 ml/min (HCC) 09/29/2019  . Thrombocytopenia ([place]) 09/29/2019  . Personal history of DVT (deep vein thrombosis) 09/09/2019  . History of ischemic stroke 09/09/2019  . CAD (coronary artery disease) 09/09/2019  . Chronic constipation 09/09/2019  . History of colorectal cancer 09/09/2019  . 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 Right arm weakness 09/09/2019  . Right leg weakness 09/09/2019  . 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 [place], [place], [address] Phone: [phone]   Fax:  [phone]   Name: Kendre Jacinto MRN: [id number] Date of Birth: [birth year]

## 2019-12-01 ENCOUNTER — Encounter: Payer: Self-pay | Admitting: Occupational Therapy

## 2019-12-01 ENCOUNTER — Ambulatory Visit: Payer: No Typology Code available for payment source

## 2019-12-01 ENCOUNTER — Other Ambulatory Visit: Payer: Self-pay

## 2019-12-01 ENCOUNTER — Ambulatory Visit: Payer: No Typology Code available for payment source | Admitting: Occupational Therapy

## 2019-12-01 DIAGNOSIS — R2681 Unsteadiness on feet: Secondary | ICD-10-CM

## 2019-12-01 DIAGNOSIS — R414 Neurologic neglect syndrome: Secondary | ICD-10-CM

## 2019-12-01 DIAGNOSIS — R600 Localized edema: Secondary | ICD-10-CM

## 2019-12-01 DIAGNOSIS — R41840 Attention and concentration deficit: Secondary | ICD-10-CM

## 2019-12-01 DIAGNOSIS — M6281 Muscle weakness (generalized): Secondary | ICD-10-CM

## 2019-12-01 DIAGNOSIS — I69351 Hemiplegia and hemiparesis following cerebral infarction affecting right dominant side: Secondary | ICD-10-CM

## 2019-12-01 DIAGNOSIS — M25611 Stiffness of right shoulder, not elsewhere classified: Secondary | ICD-10-CM

## 2019-12-01 NOTE — Therapy (Signed)
[name] [address] [place], [place], [address] Phone: [phone]   Fax:  [phone]  Physical Therapy Treatment  Patient Details  Name: Robert Mcconnell MRN: [id number] Date of Birth: February 04, 1922 Referring Provider (PT): Arnell Sieving   Encounter Date: 12/01/2019   PT End of Session - 12/01/19 1901    Visit Number 22    Number of Visits 32    Date for PT Re-Evaluation 12/25/19    Authorization Type VA    Authorization Time Period 15 visits from 7/8-11/5    Authorization - Visit Number 6    Authorization - Number of Visits 15    Progress Note Due on Visit 15    PT Start Time 1615    PT Stop Time 1700    PT Time Calculation (min) 45 min    Equipment Utilized During Treatment Gait belt    Activity Tolerance Patient tolerated treatment well    Behavior During Therapy [facility] for tasks assessed/performed           Past Medical History:  Diagnosis Date  . Atrial fibrillation ([place])   . BPH (benign prostatic hyperplasia)   . CKD (chronic kidney disease)   . History of coronary angioplasty with insertion of stent   . Hypertension   . Severe sepsis ([place]) 11/04/2019  . Stroke [facility])     Past Surgical History:  Procedure Laterality Date  . COLECTOMY  2006   cancer    There were no vitals filed for this visit.   Subjective Assessment - 12/01/19 1838    Subjective Daughter reports he is doing okay. One of the blister on his R thumb opened so he has a bandage on his R thumb from that.    Patient is accompained by: Family member    Pertinent History CVA    Limitations Standing;Walking;House hold activities;Sitting    How long can you sit comfortably? 1-2 hours    How long can you stand comfortably? <5 min    How long can you walk comfortably? unable    Patient Stated Goals walk                  Gait training: wit biodex harness with 60-60 lb unweighted, 30 feet, 56 feet, 15 feet Max A x 2  (one person shifting weight and moving LE during swing phase) other person guiding the harness system                          PT Short Term Goals - 12/01/19 1839      PT SHORT TERM GOAL #1   Title Patient will be able to perform sit to stand with mod A with or without AD to improve independence and reduce care giver burden    Baseline max A (eval); mod to max A (10/30/19), mox to max A (12/01/19)    Time 4    Period Weeks    Status On-going    Target Date 11/27/19      PT SHORT TERM GOAL #2   Title pt will be able to stand with mod A for 5 min to perform small reaching with L UE to improve standing balance.    Baseline able to stand with holding on to parallel bars with L UE but needs stabilization at knees to improve standing tolerance (max A required overall) is able to reach with max A, requires mod to max A but unable  to hold for 5 min while performing reaching    Time 4    Period Weeks    Status On-going      PT SHORT TERM GOAL #3   Title Pt will be able to propel chair with SBA  for 30 feet to improve household navigation    Baseline Able to propel chair with use of L UE and L LE with mod A for 30 feet (10/30/19); patient has power chair now (12/01/19)    Time 4    Period Weeks    Status Not Met    Target Date 10/06/19             PT Long Term Goals - 10/30/19 1812      PT LONG TERM GOAL #1   Title Patient will be able to stand for 5 min with CGA to improve standing endurance with or without AD    Baseline requires max A    Time 8    Period Weeks    Status On-going    Target Date 12/25/19      PT LONG TERM GOAL #2   Title Pt will require CGA with sit to stand and chair to chair transfers to improve indepdnence and reduce caregiver burden.    Baseline max to total A    Time 8    Period Weeks    Status On-going    Target Date 12/25/19      PT LONG TERM GOAL #3   Title Patient will be able to walk into walk-in shower and sit on shower chair  with CGA to improve independence.    Baseline bed sponge bath only    Time 8    Period Weeks    Status On-going    Target Date 12/25/19      PT LONG TERM GOAL #4   Title Pt will demo >10/56 on BBS to improve static balance and reduce fall risk    Baseline BBS 4/56 (eval)    Time 8    Period Weeks    Status On-going    Target Date 12/25/19                 Plan - 12/01/19 1840    Clinical Impression Statement Patient was able to ambulate 60 feet with biodex unweighting system with 50-60 lbs unweighted with max A to shift weight and move R and L LE. Patient required more cueing compared to previous session. We attempted to ambulate in parallel bars after supported walking, but patient was fatigued and wasn't able to take more than 2 steps with max A.    Personal Factors and Comorbidities Age;Comorbidity 3+    Comorbidities hx of stroke, hx of colorectal cancer    Examination-Activity Limitations Bathing;Bed Mobility;Caring for Others;Carry;Locomotion Level;Hygiene/Grooming;Dressing;Continence;Self Feeding;Reach Overhead;Sit;Lift;Other;Transfers;Toileting;Stand;Stairs    Examination-Participation Restrictions Church;Cleaning;Community Activity;Driving;Meal Prep;Volunteer;Shop;Medication Management;Laundry;Personal Finances;Yard Work;Interpersonal Relationship    Stability/Clinical Decision Making Unstable/Unpredictable    Rehab Potential Good    PT Frequency 2x / week    PT Duration 8 weeks    PT Treatment/Interventions ADLs/Self Care Home Management;Electrical Stimulation;Gait training;Stair training;Functional mobility training;Therapeutic exercise;Balance training;Neuromuscular re-education;Therapeutic activities;Patient/family education;Wheelchair mobility training;Manual techniques;Passive range of motion    PT Next Visit Plan continue to work on sit to stand transfers, chair to chair transfers, wheelchair mobility           Patient will benefit from skilled therapeutic  intervention in order to improve the following deficits and impairments:  Abnormal gait, Decreased activity tolerance,  Decreased coordination, Decreased safety awareness, Decreased strength, Impaired flexibility, Impaired UE functional use, Postural dysfunction, Impaired tone, Increased edema, Decreased range of motion, Decreased knowledge of precautions, Impaired sensation, Difficulty walking, Decreased mobility, Decreased endurance, Decreased balance  Visit Diagnosis: Hemiplegia and hemiparesis following cerebral infarction affecting right dominant side (HCC)  Unsteadiness on feet  Muscle weakness (generalized)  Neurologic neglect syndrome     Problem List Patient Active Problem List   Diagnosis Date Noted  . Chronic respiratory failure with hypoxia ([place]) 11/07/2019  . Pressure injury of skin 11/05/2019  . Acute renal failure superimposed on stage 4 chronic kidney disease ([place]) 11/04/2019  . Right hemiparesis ([place]) 11/04/2019  . Atrial fibrillation, chronic ([place]) 11/04/2019  . Severe sepsis ([place]) 11/04/2019  . CAP (community acquired pneumonia) 11/04/2019  . Dysphagia 11/04/2019  . Sepsis secondary to UTI ([place]) 11/03/2019  . 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 CKD (chronic kidney disease) stage 4, GFR 15-29 ml/min (HCC) 09/29/2019  . Thrombocytopenia ([place]) 09/29/2019  . Personal history of DVT (deep vein thrombosis) 09/09/2019  . History of ischemic stroke 09/09/2019  . CAD (coronary artery disease) 09/09/2019  . Chronic constipation 09/09/2019  . History of colorectal cancer 09/09/2019  . 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 Right arm weakness 09/09/2019  . Right leg weakness 09/09/2019  . 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 [place], [place], [address] Phone: [phone]   Fax:  [phone]  Name:  Robert Mcconnell MRN: [id number] Date of Birth: 11-07-1921  [facility] [address] [place], [place], [address] Phone: [phone]   Fax:  [phone]  Physical Therapy Treatment  Patient Details  Name: Robert Mcconnell MRN: [id number] Date of Birth: January 23, 1922 Referring Provider (PT): Arnell Sieving   Encounter Date: 12/01/2019   PT End of Session - 12/01/19 1901    Visit Number 22    Number of Visits 32    Date for PT Re-Evaluation 12/25/19    Authorization Type VA    Authorization Time Period 15 visits from 7/8-11/5    Authorization - Visit Number 6    Authorization - Number of Visits 15    Progress Note Due on Visit 15    PT Start Time 1615    PT Stop Time 1700    PT Time Calculation (min) 45 min    Equipment Utilized During Treatment Gait belt    Activity Tolerance Patient tolerated treatment well    Behavior During Therapy [facility] for tasks assessed/performed           Past Medical History:  Diagnosis Date  . Atrial fibrillation ([place])   . BPH (benign prostatic hyperplasia)   . CKD (chronic kidney disease)   . History of coronary angioplasty with insertion of stent   . Hypertension   . Severe sepsis ([place]) 11/04/2019  . Stroke [facility])     Past Surgical History:  Procedure Laterality Date  . COLECTOMY  2006   cancer    There were no vitals filed for this visit.   Subjective Assessment - 12/01/19 1838    Subjective Daughter reports he is doing okay. One of the blister on his R thumb opened so he has a bandage on his R thumb from that.    Patient is accompained by: Family member    Pertinent History CVA    Limitations Standing;Walking;House  hold activities;Sitting    How long can you sit comfortably? 1-2 hours    How long can you stand comfortably? <5 min    How long can you walk comfortably? unable    Patient Stated Goals walk                    Gait training: wit biodex harness  with 50-60 lb unweighted, 36fet Max A x 2 (one person shifting weight and moving LE during swing phase) other person guiding the harness system Gait training in parallel bars: 2-3 feet with max A Sit to stand: several times during the session: required max A x 1, cues for shifting weight to L side.                           PT Short Term Goals - 12/01/19 1839      PT SHORT TERM GOAL #1   Title Patient will be able to perform sit to stand with mod A with or without AD to improve independence and reduce care giver burden    Baseline max A (eval); mod to max A (10/30/19), mox to max A (12/01/19)    Time 4    Period Weeks    Status On-going    Target Date 11/27/19      PT SHORT TERM GOAL #2   Title pt will be able to stand with mod A for 5 min to perform small reaching with L UE to improve standing balance.    Baseline able to stand with holding on to parallel bars with L UE but needs stabilization at knees to improve standing tolerance (max A required overall) is able to reach with max A, requires mod to max A but unable to hold for 5 min while performing reaching    Time 4    Period Weeks    Status On-going      PT SHORT TERM GOAL #3   Title Pt will be able to propel chair with SBA  for 30 feet to improve household navigation    Baseline Able to propel chair with use of L UE and L LE with mod A for 30 feet (10/30/19); patient has power chair now (12/01/19)    Time 4    Period Weeks    Status Not Met    Target Date 10/06/19             PT Long Term Goals - 10/30/19 1812      PT LONG TERM GOAL #1   Title Patient will be able to stand for 5 min with CGA to improve standing endurance with or without AD    Baseline requires max A    Time 8    Period Weeks    Status On-going    Target Date 12/25/19      PT LONG TERM GOAL #2   Title Pt will require CGA with sit to stand and chair to chair transfers to improve indepdnence and reduce caregiver burden.     Baseline max to total A    Time 8    Period Weeks    Status On-going    Target Date 12/25/19      PT LONG TERM GOAL #3   Title Patient will be able to walk into walk-in shower and sit on shower chair with CGA to improve independence.    Baseline bed sponge bath only    Time 8  Period Weeks    Status On-going    Target Date 12/25/19      PT LONG TERM GOAL #4   Title Pt will demo >10/56 on BBS to improve static balance and reduce fall risk    Baseline BBS 4/56 (eval)    Time 8    Period Weeks    Status On-going    Target Date 12/25/19                 Plan - 12/01/19 1840    Clinical Impression Statement Patient was able to ambulate 60 feet with biodex unweighting system with 50-60 lbs unweighted with max A to shift weight and move R and L LE. Patient required more cueing compared to previous session. We attempted to ambulate in parallel bars after supported walking, but patient was fatigued and wasn't able to take more than 2 steps with max A.    Personal Factors and Comorbidities Age;Comorbidity 3+    Comorbidities hx of stroke, hx of colorectal cancer    Examination-Activity Limitations Bathing;Bed Mobility;Caring for Others;Carry;Locomotion Level;Hygiene/Grooming;Dressing;Continence;Self Feeding;Reach Overhead;Sit;Lift;Other;Transfers;Toileting;Stand;Stairs    Examination-Participation Restrictions Church;Cleaning;Community Activity;Driving;Meal Prep;Volunteer;Shop;Medication Management;Laundry;Personal Finances;Yard Work;Interpersonal Relationship    Stability/Clinical Decision Making Unstable/Unpredictable    Rehab Potential Good    PT Frequency 2x / week    PT Duration 8 weeks    PT Treatment/Interventions ADLs/Self Care Home Management;Electrical Stimulation;Gait training;Stair training;Functional mobility training;Therapeutic exercise;Balance training;Neuromuscular re-education;Therapeutic activities;Patient/family education;Wheelchair mobility training;Manual  techniques;Passive range of motion    PT Next Visit Plan continue to work on sit to stand transfers, chair to chair transfers, wheelchair mobility           Patient will benefit from skilled therapeutic intervention in order to improve the following deficits and impairments:  Abnormal gait, Decreased activity tolerance, Decreased coordination, Decreased safety awareness, Decreased strength, Impaired flexibility, Impaired UE functional use, Postural dysfunction, Impaired tone, Increased edema, Decreased range of motion, Decreased knowledge of precautions, Impaired sensation, Difficulty walking, Decreased mobility, Decreased endurance, Decreased balance  Visit Diagnosis: Hemiplegia and hemiparesis following cerebral infarction affecting right dominant side (HCC)  Unsteadiness on feet  Muscle weakness (generalized)  Neurologic neglect syndrome     Problem List Patient Active Problem List   Diagnosis Date Noted  . Chronic respiratory failure with hypoxia ([place]) 11/07/2019  . Pressure injury of skin 11/05/2019  . Acute renal failure superimposed on stage 4 chronic kidney disease ([place]) 11/04/2019  . Right hemiparesis ([place]) 11/04/2019  . Atrial fibrillation, chronic ([place]) 11/04/2019  . Severe sepsis ([place]) 11/04/2019  . CAP (community acquired pneumonia) 11/04/2019  . Dysphagia 11/04/2019  . Sepsis secondary to UTI ([place]) 11/03/2019  . 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 CKD (chronic kidney disease) stage 4, GFR 15-29 ml/min (HCC) 09/29/2019  . Thrombocytopenia ([place]) 09/29/2019  . Personal history of DVT (deep vein thrombosis) 09/09/2019  . History of ischemic stroke 09/09/2019  . CAD (coronary artery disease) 09/09/2019  . Chronic constipation 09/09/2019  . History of colorectal cancer 09/09/2019  . 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 Right arm weakness 09/09/2019  . Right leg weakness 09/09/2019  . 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

## 2019-12-01 NOTE — Therapy (Signed)
[facility] [address] [place], [place], [address] Phone: [phone]   Fax:  [phone]  Occupational Therapy Treatment and Progress Note  Patient Details  Name: Robert Mcconnell MRN: [id number] Date of Birth: [birth year] Referring Provider (OT): Eliezer Lofts   Encounter Date: 12/01/2019   OT End of Session - 12/01/19 1814    Visit Number 11    Number of Visits 13    Date for OT Re-Evaluation 11/24/19    Authorization Type VA    Authorization - Visit Number 11    Authorization - Number of Visits 15    OT Start Time 8453    OT Stop Time 1750    OT Time Calculation (min) 43 min    Activity Tolerance Patient tolerated treatment well    Behavior During Therapy [facility] for tasks assessed/performed           Past Medical History:  Diagnosis Date  . Atrial fibrillation ([place])   . BPH (benign prostatic hyperplasia)   . CKD (chronic kidney disease)   . History of coronary angioplasty with insertion of stent   . Hypertension   . Severe sepsis ([place]) 11/04/2019  . Stroke [facility])     Past Surgical History:  Procedure Laterality Date  . COLECTOMY  2006   cancer    There were no vitals filed for this visit.   Subjective Assessment - 12/01/19 1808    Subjective  Patient indicates several people wished him happy birthday over the weekend.    Patient is accompanied by: Family member    Currently in Pain? No/denies    Pain Score 0-No pain                        OT Treatments/Exercises (OP) - 12/01/19 0001      ADLs   Functional Mobility Continuing to work toward improved biomechanics for sit to stand to aide with ADL's  Patient needs consistent cueing and occasional facilitation to flex trunk sufficiently forward to unweight seat to load feet.  Patient able to pull self up using rail and left hand.  Daughter indicates that she is asking him to reach toward toes when asking him to stand - discussed that this  may keep his weight too far posterior (post pelvic rotation)  discussed flexing forward with chest upright.  Patient did well with sit to stand with knee strap to help prevent shifting feet.  Once 3/4 standing facilitation/support lessened, and patient abel to self correct.  Then activity directed toward weight shiftin toward left side as patient continues to push toward weaker right side.                    OT Education - 12/01/19 1813    Education Details Lean forward sufficiently for sit to stand transition    Person(s) Educated Patient;Child(ren)    Methods Explanation;Demonstration;Tactile cues;Verbal cues    Comprehension Need further instruction;Returned demonstration;Verbal cues required;Tactile cues required            OT Short Term Goals - 11/27/19 1853      OT SHORT TERM GOAL #1   Title Patient will complete lower body bathing with max assist due 6/19      OT SHORT TERM GOAL #2   Title Patient will bend forward while seated to pull pants up thighs    Status Achieved      OT SHORT TERM GOAL #3   Title Patient  will transition from sit to stand with mod assist in prep for tolieting on commode    Status Achieved      OT SHORT TERM GOAL #4   Title Patient/caregiver will don/doff custom RUE splint with min cueing    Status Deferred             OT Long Term Goals - 12/01/19 1815      OT LONG TERM GOAL #1   Title Patient will transiton from sit to stand with mod assist to allow toileting, clothing management, and hygiene    Status Achieved      OT LONG TERM GOAL #2   Title Patient/caregiver will complete a home exercise program to prevent pain, and improve passive motion in RUE to aide with hygiene and dressing tasks    Status Achieved      OT LONG TERM GOAL #3   Status Achieved      OT LONG TERM GOAL #4   Title Patient will attend to right arm, and manage right arm during transitional movements and in wheelchair with min cueing    Status On-going       OT LONG TERM GOAL #5   Title Patient will stand with min assist for sufficient time that caregiver can complete toilet hygiene and clothing management due 9/24    Status New    Target Date 01/30/20      Long Term Additional Goals   Additional Long Term Goals Yes      OT LONG TERM GOAL #6   Title Patient will complete shower transfer into and out of tub with moderate assistance    Time 8    Period Weeks    Status New                 Plan - 12/01/19 1814    Clinical Impression Statement Patient continues to show steady improvement in active standing, and static standing balance.  Family is extremely supportive.    OT Frequency 2x / week    OT Duration 6 weeks    OT Treatment/Interventions Self-care/ADL training;Electrical Stimulation;Therapeutic exercise;Visual/perceptual remediation/compensation;Patient/family education;Splinting;Neuromuscular education;Therapeutic activities;Balance training;Cognitive remediation/compensation;Passive range of motion;Manual Therapy;DME and/or AE instruction    Plan Postural control, sitting balance, sit to lift off, weight shift right, lower body ADL weight shifts, NMR RUE    OT Home Exercise Plan SLiding right hand on thigh, tilted stool    Consulted and Agree with Plan of Care Patient;Family member/caregiver    Family Member Consulted dtr [name]           Patient will benefit from skilled therapeutic intervention in order to improve the following deficits and impairments:           Visit Diagnosis: Hemiplegia and hemiparesis following cerebral infarction affecting right dominant side ([place]) - Plan: Ot plan of care cert/re-cert  Unsteadiness on feet - Plan: Ot plan of care cert/re-cert  Muscle weakness (generalized) - Plan: Ot plan of care cert/re-cert  Attention and concentration deficit - Plan: Ot plan of care cert/re-cert  Neurologic neglect syndrome - Plan: Ot plan of care cert/re-cert  Stiffness of right shoulder, not  elsewhere classified - Plan: Ot plan of care cert/re-cert  Localized edema - Plan: Ot plan of care cert/re-cert    Problem List Patient Active Problem List   Diagnosis Date Noted  . Chronic respiratory failure with hypoxia ([place]) 11/07/2019  . Pressure injury of skin 11/05/2019  . Acute renal failure superimposed on stage 4 chronic kidney  disease ([place]) 11/04/2019  . Right hemiparesis ([place]) 11/04/2019  . Atrial fibrillation, chronic ([place]) 11/04/2019  . Severe sepsis ([place]) 11/04/2019  . CAP (community acquired pneumonia) 11/04/2019  . Dysphagia 11/04/2019  . Sepsis secondary to UTI ([place]) 11/03/2019  . 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 CKD (chronic kidney disease) stage 4, GFR 15-29 ml/min (HCC) 09/29/2019  . Thrombocytopenia ([place]) 09/29/2019  . Personal history of DVT (deep vein thrombosis) 09/09/2019  . History of ischemic stroke 09/09/2019  . CAD (coronary artery disease) 09/09/2019  . Chronic constipation 09/09/2019  . History of colorectal cancer 09/09/2019  . 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 Right arm weakness 09/09/2019  . Right leg weakness 09/09/2019  . 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[place]) 81/05/7508   *Seeking recertification for add'l OT visits.    Mariah Milling, OTR/L 12/01/2019, 6:21 PM  [facility] [address] [place], [place], [address] Phone: [phone]   Fax:  [phone]  Name: Robert Mcconnell MRN: [id number] Date of Birth: [birth year]

## 2019-12-04 ENCOUNTER — Ambulatory Visit: Payer: No Typology Code available for payment source

## 2019-12-04 ENCOUNTER — Ambulatory Visit: Payer: No Typology Code available for payment source | Admitting: Occupational Therapy

## 2019-12-04 ENCOUNTER — Other Ambulatory Visit: Payer: Self-pay

## 2019-12-04 ENCOUNTER — Encounter: Payer: Self-pay | Admitting: Occupational Therapy

## 2019-12-04 ENCOUNTER — Ambulatory Visit: Payer: No Typology Code available for payment source | Admitting: Speech Pathology

## 2019-12-04 DIAGNOSIS — M6281 Muscle weakness (generalized): Secondary | ICD-10-CM

## 2019-12-04 DIAGNOSIS — R41840 Attention and concentration deficit: Secondary | ICD-10-CM

## 2019-12-04 DIAGNOSIS — R2681 Unsteadiness on feet: Secondary | ICD-10-CM

## 2019-12-04 DIAGNOSIS — I69351 Hemiplegia and hemiparesis following cerebral infarction affecting right dominant side: Secondary | ICD-10-CM | POA: Diagnosis not present

## 2019-12-04 DIAGNOSIS — R600 Localized edema: Secondary | ICD-10-CM

## 2019-12-04 DIAGNOSIS — R1310 Dysphagia, unspecified: Secondary | ICD-10-CM

## 2019-12-04 DIAGNOSIS — M25611 Stiffness of right shoulder, not elsewhere classified: Secondary | ICD-10-CM

## 2019-12-04 DIAGNOSIS — R471 Dysarthria and anarthria: Secondary | ICD-10-CM

## 2019-12-04 DIAGNOSIS — R41841 Cognitive communication deficit: Secondary | ICD-10-CM

## 2019-12-04 DIAGNOSIS — R414 Neurologic neglect syndrome: Secondary | ICD-10-CM

## 2019-12-04 NOTE — Therapy (Signed)
[facility] [address] [place], [place], [address] Phone: [phone]   Fax:  [phone]  Speech Language Pathology Treatment and Discharge Summary  Patient Details  Name: Robert Mcconnell MRN: [id number] Date of Birth: 25-Feb-1922 Referring Provider (SLP): Wynema Birch, [place]   Encounter Date: 12/04/2019   End of Session - 12/04/19 1753    Visit Number 7    Number of Visits 15    Date for SLP Re-Evaluation 12/05/19    Authorization Type [facility]    Authorization - Visit Number 7    Authorization - Number of Visits 15    SLP Start Time 3009    SLP Stop Time  1742    SLP Time Calculation (min) 38 min    Activity Tolerance Patient tolerated treatment well           Past Medical History:  Diagnosis Date  . Atrial fibrillation ([place])   . BPH (benign prostatic hyperplasia)   . CKD (chronic kidney disease)   . History of coronary angioplasty with insertion of stent   . Hypertension   . Severe sepsis ([place]) 11/04/2019  . Stroke [facility])     Past Surgical History:  Procedure Laterality Date  . COLECTOMY  2006   cancer    There were no vitals filed for this visit.   Subjective Assessment - 12/04/19 1708    Subjective "He's teaching his aide Benin."    Patient is accompained by: Family member   Daughter [name]                ADULT SLP TREATMENT - 12/04/19 1745      General Information   Behavior/Cognition Requires cueing;Decreased sustained attention;Alert      Treatment Provided   Treatment provided Cognitive-Linquistic      Dysphagia Treatment   Temperature Spikes Noted No    Respiratory Status Room air    Oral Cavity - Dentition Adequate natural dentition    Treatment Methods Patient/caregiver education    Patient observed directly with PO's No    Other treatment/comments Pt continues to take regular/thin with daughter verbalizing known risk of aspiration. She reports pt coughs when drinking too  quickly. SLP suggested they could try cup to modify rate/bolus size such as Provale cup. Again provided handout with oral care recommendations and signs of aspiration pneumonia.       Pain Assessment   Pain Assessment No/denies pain      Cognitive-Linquistic Treatment   Treatment focused on Dysarthria;Patient/family/caregiver education    Skilled Treatment Did not make list of functional phrases, but report pt is working daily with aide to practice phrases in Ben[place]. Pt intelligibility in simple conversation ~60-70% for SLP; daughter cued pt appropriately when mixing Romanian/English and demonstrated loud, slow rate. SLP had to remind pt to swallow several times for secretion management, which also improved intelligibility. Daughter continues to report she and her mother have no trouble understanding pt at home. Education completed and pt/daughter in agreement with d/c from [facility] today.       Assessment / Recommendations / Plan   Plan Discharge SLP treatment due to (comment)   remaining LTG achieved     Progression Toward Goals   Progression toward goals Goals met, education completed, patient discharged from SLP            SLP Education - 12/04/19 1752    Education Details signs of aspiration PNA, oral care, intelligibility strategies/cuing    Person(s) Educated Patient;Child(ren)  Methods Explanation;Demonstration;Verbal cues;Handout    Comprehension Verbalized understanding;Returned demonstration            SLP Short Term Goals - 12/04/19 1753      SLP SHORT TERM GOAL #1   Title pt will demo sustained attention for a 5- minute familiar task x5, in 3 sessions    Time 3    Period Weeks    Status Deferred      SLP SHORT TERM GOAL #2   Title pt will demo knowledge of a memory book when asked pertinent questions dealing with memory in 3 sessions    Time 3    Period Weeks    Status Deferred      SLP SHORT TERM GOAL #3   Title Pt will complete dysarthria HEP with usual mod  cues from caregiver x3 sessions.    Time 2    Period Weeks    Status On-going            SLP Long Term Goals - 12/04/19 1753      SLP LONG TERM GOAL #1   Title pt will demo sustained attention for 3 10 minute therapy tasks in 3 sessions    Time 7    Period Weeks   or 15 total visits, for all LTGs   Status Deferred      SLP LONG TERM GOAL #2   Title pt will engage with memory notebook/planner, WNL procedure, with min nonverbal cues to do so, in 3 sessions    Time 7    Period Weeks    Status Deferred      SLP LONG TERM GOAL #3   Title Pt/family will tell SLP 3 signs of aspiration PNA.    Time 7    Period Weeks    Status Achieved      SLP LONG TERM GOAL #4   Title Caregiver will demonstrate appropriate cuing when speech is unintelligible.    Time 2    Period Weeks    Status Achieved            Plan - 12/04/19 1754    Clinical Impression Statement Pt presents with significant deficits in cognitive linguistics incluing attention, memory, awareness. Pt with multi-factorial sx making improvement in speech and language difficult but SLP; attention today was adequate for pt to participate in simple functional speech tasks with mod-max cues. Intelligibility for this therapist in simple conversation with shared context was 60-70%. Daughter reports improved understanding of pt communicating at home. Patient/daughter in agreement with d/c at this time; SLP feels max rehab potential met at this time.    Speech Therapy Frequency 2x / week    Duration --   7 weeks (or 15 visits - VA auth)   Treatment/Interventions Environmental controls;Oral motor exercises;Functional tasks;Multimodal communcation approach;Cueing hierarchy;Trials of upgraded texture/liquids;Diet toleration management by SLP;SLP instruction and feedback;Language facilitation;Cognitive reorganization;Compensatory strategies;Patient/family education;Internal/external aids    Potential to Achieve Goals Fair    Potential  Considerations Severity of impairments    Consulted and Agree with Plan of Care Patient;Family member/caregiver    Family Member Consulted son in law           Patient will benefit from skilled therapeutic intervention in order to improve the following deficits and impairments:   Dysarthria and anarthria  Cognitive communication deficit  Dysphagia, unspecified type    Problem List Patient Active Problem List   Diagnosis Date Noted  . Chronic respiratory failure with hypoxia ([place]) 11/07/2019  . Pressure  injury of skin 11/05/2019  . Acute renal failure superimposed on stage 4 chronic kidney disease ([place]) 11/04/2019  . Right hemiparesis ([place]) 11/04/2019  . Atrial fibrillation, chronic ([place]) 11/04/2019  . Severe sepsis ([place]) 11/04/2019  . CAP (community acquired pneumonia) 11/04/2019  . Dysphagia 11/04/2019  . Sepsis secondary to UTI ([place]) 11/03/2019  . 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 CKD (chronic kidney disease) stage 4, GFR 15-29 ml/min (HCC) 09/29/2019  . Thrombocytopenia ([place]) 09/29/2019  . Personal history of DVT (deep vein thrombosis) 09/09/2019  . History of ischemic stroke 09/09/2019  . CAD (coronary artery disease) 09/09/2019  . Chronic constipation 09/09/2019  . History of colorectal cancer 09/09/2019  . 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 Right arm weakness 09/09/2019  . Right leg weakness 09/09/2019  . 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[place]) 09/09/2019   SPEECH THERAPY DISCHARGE SUMMARY  Visits from Start of Care: 7  Current functional level related to goals / functional outcomes: Intelligibility in simple, contextual conversation was 60-70% for this familiar listener today. Daughter demonstrates appropriate cuing of pt when unintelligible.    Remaining deficits: Dysarthria, cognitive deficits and dysphagia persist. Patient continues with regular/thin diet; family have been educated on and verbalize understanding of  aspiration risks.    Education / Equipment: Oral care, signs of aspiration PNA, home dysarthria practice Plan: Patient agrees to discharge.  Patient goals were partially met. Patient is being discharged due to being pleased with the current functional level.  ?????         Deneise Lever, [place], CCC-SLP Speech-Language Pathologist  Aliene Altes 12/04/2019, 5:56 PM  [facility] [address] [place], [place], [address] Phone: [phone]   Fax:  [phone]   Name: Robert Mcconnell MRN: [id number] Date of Birth: [birth year]

## 2019-12-04 NOTE — Therapy (Signed)
[facility] [address] [place], [place], [address] Phone: [phone]   Fax:  [phone]  Occupational Therapy Treatment  Patient Details  Name: Robert Mcconnell MRN: [id number] Date of Birth: [birth year] Referring Provider (OT): Eliezer Lofts   Encounter Date: 12/04/2019   OT End of Session - 12/04/19 1658    Visit Number 12    Number of Visits 13    Date for OT Re-Evaluation 11/24/19    Authorization Type VA    Authorization - Visit Number 12    Authorization - Number of Visits 15    OT Start Time 2595    OT Stop Time 1615    OT Time Calculation (min) 45 min    Activity Tolerance Patient tolerated treatment well    Behavior During Therapy [facility] for tasks assessed/performed           Past Medical History:  Diagnosis Date  . Atrial fibrillation ([place])   . BPH (benign prostatic hyperplasia)   . CKD (chronic kidney disease)   . History of coronary angioplasty with insertion of stent   . Hypertension   . Severe sepsis ([place]) 11/04/2019  . Stroke [facility])     Past Surgical History:  Procedure Laterality Date  . COLECTOMY  2006   cancer    There were no vitals filed for this visit.   Subjective Assessment - 12/04/19 1617    Subjective  What do you want me to do?    Patient is accompanied by: Family member    Currently in Pain? No/denies    Pain Score 0-No pain                        OT Treatments/Exercises (OP) - 12/04/19 0001      ADLs   Functional Mobility Working toward decreased assistance with sit to stand and stand tolerance, stand balance, weight shifting toward right side.  Patient at times able to stand with no additiona support, and today even able to correct his balance on multiple occasions.                      OT Short Term Goals - 11/27/19 1853      OT SHORT TERM GOAL #1   Title Patient will complete lower body bathing with max assist due 6/19      OT SHORT TERM  GOAL #2   Title Patient will bend forward while seated to pull pants up thighs    Status Achieved      OT SHORT TERM GOAL #3   Title Patient will transition from sit to stand with mod assist in prep for tolieting on commode    Status Achieved      OT SHORT TERM GOAL #4   Title Patient/caregiver will don/doff custom RUE splint with min cueing    Status Deferred             OT Long Term Goals - 12/01/19 1815      OT LONG TERM GOAL #1   Title Patient will transiton from sit to stand with mod assist to allow toileting, clothing management, and hygiene    Status Achieved      OT LONG TERM GOAL #2   Title Patient/caregiver will complete a home exercise program to prevent pain, and improve passive motion in RUE to aide with hygiene and dressing tasks    Status Achieved      OT LONG  TERM GOAL #3   Status Achieved      OT LONG TERM GOAL #4   Title Patient will attend to right arm, and manage right arm during transitional movements and in wheelchair with min cueing    Status On-going      OT LONG TERM GOAL #5   Title Patient will stand with min assist for sufficient time that caregiver can complete toilet hygiene and clothing management due 9/24    Status New    Target Date 01/30/20      Long Term Additional Goals   Additional Long Term Goals Yes      OT LONG TERM GOAL #6   Title Patient will complete shower transfer into and out of tub with moderate assistance    Time 8    Period Weeks    Status New                 Plan - 12/04/19 1659    Clinical Impression Statement Patient continues to show steady improvement in active standing, and static standing balance.  Family is extremely supportive.    OT Frequency 2x / week    OT Duration 6 weeks    OT Treatment/Interventions Self-care/ADL training;Electrical Stimulation;Therapeutic exercise;Visual/perceptual remediation/compensation;Patient/family education;Splinting;Neuromuscular education;Therapeutic  activities;Balance training;Cognitive remediation/compensation;Passive range of motion;Manual Therapy;DME and/or AE instruction    Plan Postural control, sitting balance, sit to lift off, weight shift right, lower body ADL weight shifts, NMR RUE    OT Home Exercise Plan SLiding right hand on thigh, tilted stool    Consulted and Agree with Plan of Care Patient;Family member/caregiver           Patient will benefit from skilled therapeutic intervention in order to improve the following deficits and impairments:           Visit Diagnosis: Hemiplegia and hemiparesis following cerebral infarction affecting right dominant side (HCC)  Unsteadiness on feet  Muscle weakness (generalized)  Attention and concentration deficit  Neurologic neglect syndrome  Stiffness of right shoulder, not elsewhere classified  Localized edema    Problem List Patient Active Problem List   Diagnosis Date Noted  . Chronic respiratory failure with hypoxia ([place]) 11/07/2019  . Pressure injury of skin 11/05/2019  . Acute renal failure superimposed on stage 4 chronic kidney disease ([place]) 11/04/2019  . Right hemiparesis ([place]) 11/04/2019  . Atrial fibrillation, chronic ([place]) 11/04/2019  . Severe sepsis ([place]) 11/04/2019  . CAP (community acquired pneumonia) 11/04/2019  . Dysphagia 11/04/2019  . Sepsis secondary to UTI ([place]) 11/03/2019  . 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 CKD (chronic kidney disease) stage 4, GFR 15-29 ml/min (HCC) 09/29/2019  . Thrombocytopenia ([place]) 09/29/2019  . Personal history of DVT (deep vein thrombosis) 09/09/2019  . History of ischemic stroke 09/09/2019  . CAD (coronary artery disease) 09/09/2019  . Chronic constipation 09/09/2019  . History of colorectal cancer 09/09/2019  . 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 Right arm weakness 09/09/2019  . Right leg weakness 09/09/2019  . 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 [place], [place], [address] Phone: [phone]   Fax:  [phone]  Name: Robert Mcconnell MRN: [id number] Date of Birth: 09-Feb-1922

## 2019-12-04 NOTE — Patient Instructions (Signed)
Speech: Cue him to swallow or clear throat if you hear a lot of saliva. Continue regular practice cuing for LOUD speech. Have him practice this daily ([place] common phrases, too, to use on the phone with a friend).   Swallowing: If you are having trouble with slowing him down when he is drinking, consider looking into a cup that limits sip size. Check out the Provale cup.   Continue with regular oral care to reduce oral bacterial load.   Continue monitoring for signs of aspiration pneumonia.  Signs of Aspiration Pneumonia    Chest pain/tightness  Fever (can be low grade)  Cough  o With foul-smelling phlegm (sputum) o With sputum containing pus or blood o With greenish sputum  Fatigue   Shortness of breath   Wheezing   **IF YOU HAVE THESE SIGNS, CONTACT YOUR DOCTOR OR GO TO THE EMERGENCY DEPARTMENT OR URGENT CARE AS SOON AS POSSIBLE**

## 2019-12-04 NOTE — Therapy (Signed)
[facility] [address] [place], [place], [address] Phone: [phone]   Fax:  [phone]  Physical Therapy Treatment  Patient Details  Name: Robert Mcconnell MRN: [id number] Date of Birth: 05-16-1921 Referring Provider (PT): Arnell Sieving   Encounter Date: 12/04/2019   PT End of Session - 12/04/19 1827    Visit Number 23    Number of Visits 32    Date for PT Re-Evaluation 12/25/19    Authorization Type VA    Authorization Time Period 15 visits from 7/8-11/5    Authorization - Visit Number 7    Authorization - Number of Visits 15    Progress Note Due on Visit 15    PT Start Time 1324    PT Stop Time 1700    PT Time Calculation (min) 45 min    Equipment Utilized During Treatment Gait belt    Activity Tolerance Patient tolerated treatment well    Behavior During Therapy [facility] for tasks assessed/performed           Past Medical History:  Diagnosis Date  . Atrial fibrillation ([place])   . BPH (benign prostatic hyperplasia)   . CKD (chronic kidney disease)   . History of coronary angioplasty with insertion of stent   . Hypertension   . Severe sepsis ([place]) 11/04/2019  . Stroke [facility])     Past Surgical History:  Procedure Laterality Date  . COLECTOMY  2006   cancer    There were no vitals filed for this visit.   Subjective Assessment - 12/04/19 1825    Subjective Son in law reports that he was doing well yesterday and today with stnading at home.    Patient is accompained by: Family member    Pertinent History CVA    Limitations Standing;Walking;House hold activities;Sitting    How long can you sit comfortably? 1-2 hours    How long can you stand comfortably? <5 min    How long can you walk comfortably? unable    Patient Stated Goals walk                     Gait training: wit biodex harness with 40-60 lb unweighted, 2 x 115' Max A x 2 (one person shifting weight and moving LE during  swing phase) other person guiding the harness system                          PT Short Term Goals - 12/01/19 1839      PT SHORT TERM GOAL #1   Title Patient will be able to perform sit to stand with mod A with or without AD to improve independence and reduce care giver burden    Baseline max A (eval); mod to max A (10/30/19), mox to max A (12/01/19)    Time 4    Period Weeks    Status On-going    Target Date 11/27/19      PT SHORT TERM GOAL #2   Title pt will be able to stand with mod A for 5 min to perform small reaching with L UE to improve standing balance.    Baseline able to stand with holding on to parallel bars with L UE but needs stabilization at knees to improve standing tolerance (max A required overall) is able to reach with max A, requires mod to max A but unable to hold for 5 min while performing reaching  Time 4    Period Weeks    Status On-going      PT SHORT TERM GOAL #3   Title Pt will be able to propel chair with SBA  for 30 feet to improve household navigation    Baseline Able to propel chair with use of L UE and L LE with mod A for 30 feet (10/30/19); patient has power chair now (12/01/19)    Time 4    Period Weeks    Status Not Met    Target Date 10/06/19             PT Long Term Goals - 10/30/19 1812      PT LONG TERM GOAL #1   Title Patient will be able to stand for 5 min with CGA to improve standing endurance with or without AD    Baseline requires max A    Time 8    Period Weeks    Status On-going    Target Date 12/25/19      PT LONG TERM GOAL #2   Title Pt will require CGA with sit to stand and chair to chair transfers to improve indepdnence and reduce caregiver burden.    Baseline max to total A    Time 8    Period Weeks    Status On-going    Target Date 12/25/19      PT LONG TERM GOAL #3   Title Patient will be able to walk into walk-in shower and sit on shower chair with CGA to improve independence.    Baseline bed  sponge bath only    Time 8    Period Weeks    Status On-going    Target Date 12/25/19      PT LONG TERM GOAL #4   Title Pt will demo >10/56 on BBS to improve static balance and reduce fall risk    Baseline BBS 4/56 (eval)    Time 8    Period Weeks    Status On-going    Target Date 12/25/19                 Plan - 12/04/19 1825    Clinical Impression Statement Patient demonstrated significant improvement in his walking distance compared to previsous session with max A x 2 with standing frame.    Personal Factors and Comorbidities Age;Comorbidity 3+    Comorbidities hx of stroke, hx of colorectal cancer    Examination-Activity Limitations Bathing;Bed Mobility;Caring for Others;Carry;Locomotion Level;Hygiene/Grooming;Dressing;Continence;Self Feeding;Reach Overhead;Sit;Lift;Other;Transfers;Toileting;Stand;Stairs    Examination-Participation Restrictions Church;Cleaning;Community Activity;Driving;Meal Prep;Volunteer;Shop;Medication Management;Laundry;Personal Finances;Yard Work;Interpersonal Relationship    Stability/Clinical Decision Making Unstable/Unpredictable    Rehab Potential Good    PT Frequency 2x / week    PT Duration 8 weeks    PT Treatment/Interventions ADLs/Self Care Home Management;Electrical Stimulation;Gait training;Stair training;Functional mobility training;Therapeutic exercise;Balance training;Neuromuscular re-education;Therapeutic activities;Patient/family education;Wheelchair mobility training;Manual techniques;Passive range of motion    PT Next Visit Plan continue to work on sit to stand transfers, chair to chair transfers, wheelchair mobility           Patient will benefit from skilled therapeutic intervention in order to improve the following deficits and impairments:  Abnormal gait, Decreased activity tolerance, Decreased coordination, Decreased safety awareness, Decreased strength, Impaired flexibility, Impaired UE functional use, Postural dysfunction,  Impaired tone, Increased edema, Decreased range of motion, Decreased knowledge of precautions, Impaired sensation, Difficulty walking, Decreased mobility, Decreased endurance, Decreased balance  Visit Diagnosis: Hemiplegia and hemiparesis following cerebral infarction affecting right dominant side ([place])  Unsteadiness on feet  Muscle weakness (generalized)     Problem List Patient Active Problem List   Diagnosis Date Noted  . Chronic respiratory failure with hypoxia ([place]) 11/07/2019  . Pressure injury of skin 11/05/2019  . Acute renal failure superimposed on stage 4 chronic kidney disease ([place]) 11/04/2019  . Right hemiparesis ([place]) 11/04/2019  . Atrial fibrillation, chronic ([place]) 11/04/2019  . Severe sepsis ([place]) 11/04/2019  . CAP (community acquired pneumonia) 11/04/2019  . Dysphagia 11/04/2019  . Sepsis secondary to UTI ([place]) 11/03/2019  . 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 CKD (chronic kidney disease) stage 4, GFR 15-29 ml/min (HCC) 09/29/2019  . Thrombocytopenia ([place]) 09/29/2019  . Personal history of DVT (deep vein thrombosis) 09/09/2019  . History of ischemic stroke 09/09/2019  . CAD (coronary artery disease) 09/09/2019  . Chronic constipation 09/09/2019  . History of colorectal cancer 09/09/2019  . 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 Right arm weakness 09/09/2019  . Right leg weakness 09/09/2019  . 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

## 2019-12-09 ENCOUNTER — Ambulatory Visit: Payer: No Typology Code available for payment source | Attending: Physician Assistant

## 2019-12-09 ENCOUNTER — Encounter: Payer: Self-pay | Admitting: Occupational Therapy

## 2019-12-09 ENCOUNTER — Other Ambulatory Visit: Payer: Self-pay

## 2019-12-09 ENCOUNTER — Ambulatory Visit: Payer: No Typology Code available for payment source | Admitting: Speech Pathology

## 2019-12-09 ENCOUNTER — Ambulatory Visit: Payer: No Typology Code available for payment source | Admitting: Occupational Therapy

## 2019-12-09 DIAGNOSIS — M6281 Muscle weakness (generalized): Secondary | ICD-10-CM

## 2019-12-09 DIAGNOSIS — R414 Neurologic neglect syndrome: Secondary | ICD-10-CM

## 2019-12-09 DIAGNOSIS — R600 Localized edema: Secondary | ICD-10-CM

## 2019-12-09 DIAGNOSIS — I69351 Hemiplegia and hemiparesis following cerebral infarction affecting right dominant side: Secondary | ICD-10-CM

## 2019-12-09 DIAGNOSIS — Z8673 Personal history of transient ischemic attack (TIA), and cerebral infarction without residual deficits: Secondary | ICD-10-CM | POA: Diagnosis present

## 2019-12-09 DIAGNOSIS — R2681 Unsteadiness on feet: Secondary | ICD-10-CM

## 2019-12-09 DIAGNOSIS — M25611 Stiffness of right shoulder, not elsewhere classified: Secondary | ICD-10-CM

## 2019-12-09 DIAGNOSIS — R41840 Attention and concentration deficit: Secondary | ICD-10-CM

## 2019-12-09 NOTE — Therapy (Signed)
[facility] [address] [place], [place], [address] Phone: [phone]   Fax:  [phone]  Physical Therapy Treatment  Patient Details  Name: Robert Mcconnell MRN: [id number] Date of Birth: [birth year] Referring Provider (PT): Arnell Sieving   Encounter Date: 12/09/2019    Past Medical History:  Diagnosis Date  . Atrial fibrillation ([place])   . BPH (benign prostatic hyperplasia)   . CKD (chronic kidney disease)   . History of coronary angioplasty with insertion of stent   . Hypertension   . Severe sepsis ([place]) 11/04/2019  . Stroke [facility])     Past Surgical History:  Procedure Laterality Date  . COLECTOMY  2006   cancer    There were no vitals filed for this visit.   Subjective Assessment - 12/09/19 1736    Subjective He is standing better. He is trying to stand up on his own.    Patient is accompained by: Family member    Pertinent History CVA    Limitations Standing;Walking;House hold activities;Sitting    How long can you sit comfortably? 1-2 hours    How long can you stand comfortably? <5 min    How long can you walk comfortably? unable    Patient Stated Goals walk                   Gait training: wit biodex harness with 40-100 lbs (with correct patient weight plugged into the machine- previously wrong patient was plugged into machine at 150lbs which demo 40-60lb unweighted by machine) unweighted, 2 x 115' Max A x 2 (one person shifting weight and moving LE during swing phase) other person guiding the harness system                           PT Short Term Goals - 12/01/19 1839      PT SHORT TERM GOAL #1   Title Patient will be able to perform sit to stand with mod A with or without AD to improve independence and reduce care giver burden    Baseline max A (eval); mod to max A (10/30/19), mox to max A (12/01/19)    Time 4    Period Weeks    Status On-going    Target  Date 11/27/19      PT SHORT TERM GOAL #2   Title pt will be able to stand with mod A for 5 min to perform small reaching with L UE to improve standing balance.    Baseline able to stand with holding on to parallel bars with L UE but needs stabilization at knees to improve standing tolerance (max A required overall) is able to reach with max A, requires mod to max A but unable to hold for 5 min while performing reaching    Time 4    Period Weeks    Status On-going      PT SHORT TERM GOAL #3   Title Pt will be able to propel chair with SBA  for 30 feet to improve household navigation    Baseline Able to propel chair with use of L UE and L LE with mod A for 30 feet (10/30/19); patient has power chair now (12/01/19)    Time 4    Period Weeks    Status Not Met    Target Date 10/06/19             PT Long Term Goals -  10/30/19 1812      PT LONG TERM GOAL #1   Title Patient will be able to stand for 5 min with CGA to improve standing endurance with or without AD    Baseline requires max A    Time 8    Period Weeks    Status On-going    Target Date 12/25/19      PT LONG TERM GOAL #2   Title Pt will require CGA with sit to stand and chair to chair transfers to improve indepdnence and reduce caregiver burden.    Baseline max to total A    Time 8    Period Weeks    Status On-going    Target Date 12/25/19      PT LONG TERM GOAL #3   Title Patient will be able to walk into walk-in shower and sit on shower chair with CGA to improve independence.    Baseline bed sponge bath only    Time 8    Period Weeks    Status On-going    Target Date 12/25/19      PT LONG TERM GOAL #4   Title Pt will demo >10/56 on BBS to improve static balance and reduce fall risk    Baseline BBS 4/56 (eval)    Time 8    Period Weeks    Status On-going    Target Date 12/25/19                  Patient will benefit from skilled therapeutic intervention in order to improve the following deficits and  impairments:     Visit Diagnosis: Hemiplegia and hemiparesis following cerebral infarction affecting right dominant side (HCC)  Unsteadiness on feet  Muscle weakness (generalized)     Problem List Patient Active Problem List   Diagnosis Date Noted  . Chronic respiratory failure with hypoxia ([place]) 11/07/2019  . Pressure injury of skin 11/05/2019  . Acute renal failure superimposed on stage 4 chronic kidney disease ([place]) 11/04/2019  . Right hemiparesis ([place]) 11/04/2019  . Atrial fibrillation, chronic ([place]) 11/04/2019  . Severe sepsis ([place]) 11/04/2019  . CAP (community acquired pneumonia) 11/04/2019  . Dysphagia 11/04/2019  . Sepsis secondary to UTI ([place]) 11/03/2019  . 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 CKD (chronic kidney disease) stage 4, GFR 15-29 ml/min (HCC) 09/29/2019  . Thrombocytopenia ([place]) 09/29/2019  . Personal history of DVT (deep vein thrombosis) 09/09/2019  . History of ischemic stroke 09/09/2019  . CAD (coronary artery disease) 09/09/2019  . Chronic constipation 09/09/2019  . History of colorectal cancer 09/09/2019  . 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 Right arm weakness 09/09/2019  . Right leg weakness 09/09/2019  . 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 [place], [place], [address] Phone: [phone]   Fax:  [phone]  Name: Robert Mcconnell MRN: [id number] Date of Birth: 19-Sep-1921

## 2019-12-09 NOTE — Therapy (Signed)
[facility] [address] [place], [place], [address] Phone: [phone]   Fax:  [phone]  Occupational Therapy Treatment  Patient Details  Name: Robert Mcconnell MRN: [id number] Date of Birth: 09/29/1921 Referring Provider (OT): Eliezer Lofts   Encounter Date: 12/09/2019   OT End of Session - 12/09/19 1858    Visit Number 13    Number of Visits 26    Date for OT Re-Evaluation 01/30/20    Authorization Type VA    Authorization - Visit Number 40    Authorization - Number of Visits 15    Progress Note Due on Visit 29    OT Start Time 5093    OT Stop Time 2671    OT Time Calculation (min) 40 min    Activity Tolerance Patient tolerated treatment well    Behavior During Therapy [facility] for tasks assessed/performed           Past Medical History:  Diagnosis Date   Atrial fibrillation ([place])    BPH (benign prostatic hyperplasia)    CKD (chronic kidney disease)    History of coronary angioplasty with insertion of stent    Hypertension    Severe sepsis ([place]) 11/04/2019   Stroke [facility])     Past Surgical History:  Procedure Laterality Date   COLECTOMY  2006   cancer    There were no vitals filed for this visit.   Subjective Assessment - 12/09/19 1848    Subjective  You help me!    Patient is accompanied by: Family member    Currently in Pain? No/denies    Pain Score 0-No pain                        OT Treatments/Exercises (OP) - 12/09/19 0001      ADLs   Functional Mobility Working to increase stand tolerance and stand balance with use of grab bar, simulating stand at bedside holding bedrail - with ultimate goal of standing to allow toilet hygiene.  Patient's daughter [name] participated in each session, and feels he is improving in his ability to pull to stand at home.  Patient needs emphasis on standing balance.  He can activate RLE, but he has difficulty sustaining that muscle activity  without frequent cueing and facilitation.  Patient also distracted by activity - so needs reminders that balance is more important than sorting or stacking activity.  With distraction - patient standing for 2 minutes today, at times without UE support.                      OT Short Term Goals - 11/27/19 1853      OT SHORT TERM GOAL #1   Title Patient will complete lower body bathing with max assist due 6/19      OT SHORT TERM GOAL #2   Title Patient will bend forward while seated to pull pants up thighs    Status Achieved      OT SHORT TERM GOAL #3   Title Patient will transition from sit to stand with mod assist in prep for tolieting on commode    Status Achieved      OT SHORT TERM GOAL #4   Title Patient/caregiver will don/doff custom RUE splint with min cueing    Status Deferred             OT Long Term Goals - 12/01/19 1815      OT LONG  TERM GOAL #1   Title Patient will transiton from sit to stand with mod assist to allow toileting, clothing management, and hygiene    Status Achieved      OT LONG TERM GOAL #2   Title Patient/caregiver will complete a home exercise program to prevent pain, and improve passive motion in RUE to aide with hygiene and dressing tasks    Status Achieved      OT LONG TERM GOAL #3   Status Achieved      OT LONG TERM GOAL #4   Title Patient will attend to right arm, and manage right arm during transitional movements and in wheelchair with min cueing    Status On-going      OT LONG TERM GOAL #5   Title Patient will stand with min assist for sufficient time that caregiver can complete toilet hygiene and clothing management due 9/24    Status New    Target Date 01/30/20      Long Term Additional Goals   Additional Long Term Goals Yes      OT LONG TERM GOAL #6   Title Patient will complete shower transfer into and out of tub with moderate assistance    Time 8    Period Weeks    Status New                 Plan -  12/09/19 1859    Clinical Impression Statement Patient continues to show steady improvement in active standing, and static standing tolerance.    OT Frequency 2x / week    OT Duration 6 weeks    OT Treatment/Interventions Self-care/ADL training;Electrical Stimulation;Therapeutic exercise;Visual/perceptual remediation/compensation;Patient/family education;Splinting;Neuromuscular education;Therapeutic activities;Balance training;Cognitive remediation/compensation;Passive range of motion;Manual Therapy;DME and/or AE instruction    Plan Postural control, sitting balance, sit to lift off, weight shift right, lower body ADL weight shifts, NMR RUE    OT Home Exercise Plan SLiding right hand on thigh, tilted stool    Consulted and Agree with Plan of Care Patient;Family member/caregiver    Family Member Consulted dtr [name]           Patient will benefit from skilled therapeutic intervention in order to improve the following deficits and impairments:           Visit Diagnosis: Hemiplegia and hemiparesis following cerebral infarction affecting right dominant side (HCC)  Unsteadiness on feet  Muscle weakness (generalized)  Attention and concentration deficit  Neurologic neglect syndrome  Stiffness of right shoulder, not elsewhere classified  Localized edema    Problem List Patient Active Problem List   Diagnosis Date Noted   Chronic respiratory failure with hypoxia ([place]) 11/07/2019   Pressure injury of skin 11/05/2019   Acute renal failure superimposed on stage 4 chronic kidney disease ([place]) 11/04/2019   Right hemiparesis ([place]) 11/04/2019   Atrial fibrillation, chronic ([place]) 11/04/2019   Severe sepsis ([place]) 11/04/2019   CAP (community acquired pneumonia) 11/04/2019   Dysphagia 11/04/2019   Sepsis secondary to UTI ([place]) 11/03/2019   Dysphagia as late effect of cerebrovascular accident (CVA) 11/03/2019   CKD (chronic kidney disease) stage 4, GFR 15-29 ml/min ([place])  09/29/2019   Thrombocytopenia ([place]) 09/29/2019   Personal history of DVT (deep vein thrombosis) 09/09/2019   History of ischemic stroke 09/09/2019   CAD (coronary artery disease) 09/09/2019   Chronic constipation 09/09/2019   History of colorectal cancer 09/09/2019   Facial paralysis on right side 09/09/2019   Right arm weakness 09/09/2019   Right leg weakness 09/09/2019  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

## 2019-12-11 ENCOUNTER — Ambulatory Visit: Payer: No Typology Code available for payment source

## 2019-12-11 ENCOUNTER — Other Ambulatory Visit: Payer: Self-pay

## 2019-12-11 ENCOUNTER — Ambulatory Visit: Payer: No Typology Code available for payment source | Admitting: Occupational Therapy

## 2019-12-11 ENCOUNTER — Encounter: Payer: Self-pay | Admitting: Occupational Therapy

## 2019-12-11 ENCOUNTER — Encounter: Payer: Medicare Other | Admitting: Speech Pathology

## 2019-12-11 DIAGNOSIS — R600 Localized edema: Secondary | ICD-10-CM

## 2019-12-11 DIAGNOSIS — R2681 Unsteadiness on feet: Secondary | ICD-10-CM

## 2019-12-11 DIAGNOSIS — I69351 Hemiplegia and hemiparesis following cerebral infarction affecting right dominant side: Secondary | ICD-10-CM

## 2019-12-11 DIAGNOSIS — M25611 Stiffness of right shoulder, not elsewhere classified: Secondary | ICD-10-CM

## 2019-12-11 DIAGNOSIS — Z8673 Personal history of transient ischemic attack (TIA), and cerebral infarction without residual deficits: Secondary | ICD-10-CM | POA: Diagnosis not present

## 2019-12-11 DIAGNOSIS — R414 Neurologic neglect syndrome: Secondary | ICD-10-CM

## 2019-12-11 DIAGNOSIS — M6281 Muscle weakness (generalized): Secondary | ICD-10-CM

## 2019-12-11 DIAGNOSIS — R41840 Attention and concentration deficit: Secondary | ICD-10-CM

## 2019-12-11 NOTE — Therapy (Signed)
[facility] [address] [place], [place], [address] Phone: [phone]   Fax:  [phone]  Occupational Therapy Treatment  Patient Details  Name: Robert Mcconnell MRN: [id number] Date of Birth: 1921-12-14 Referring Provider (OT): Eliezer Lofts   Encounter Date: 12/11/2019   OT End of Session - 12/11/19 1839    Visit Number 14    Number of Visits 26    Date for OT Re-Evaluation 01/30/20    Authorization Type VA    Authorization - Visit Number 13    Authorization - Number of Visits 26    Progress Note Due on Visit 20    OT Start Time 6712    OT Stop Time 1830    OT Time Calculation (min) 45 min    Activity Tolerance Patient tolerated treatment well    Behavior During Therapy [facility] for tasks assessed/performed           Past Medical History:  Diagnosis Date  . Atrial fibrillation ([place])   . BPH (benign prostatic hyperplasia)   . CKD (chronic kidney disease)   . History of coronary angioplasty with insertion of stent   . Hypertension   . Severe sepsis ([place]) 11/04/2019  . Stroke [facility])     Past Surgical History:  Procedure Laterality Date  . COLECTOMY  2006   cancer    There were no vitals filed for this visit.   Subjective Assessment - 12/11/19 1835    Subjective  My muscles - weak    Patient is accompanied by: Family member    Currently in Pain? No/denies    Pain Score 0-No pain                        OT Treatments/Exercises (OP) - 12/11/19 0001      ADLs   Functional Mobility Continuing to work to improve sit to stand and stand tolerance.  Used sit to stand lift as basis to address stand from high perch.  Patient able to stand for several minutes while completing a task - stacking dominoes.  Patient on several occasions caught his balance when drifting toward right side.  Followed with sit to partial stand from elevated sitting position.  Patient able to transition with minimal asistance,  and even free left hand for several seconds to rub his daughter's shoulder.  Continue to work toward more independent standing to allow family to provide toileting assist on toilet versus in brief, and in bed.                      OT Short Term Goals - 11/27/19 1853      OT SHORT TERM GOAL #1   Title Patient will complete lower body bathing with max assist due 6/19      OT SHORT TERM GOAL #2   Title Patient will bend forward while seated to pull pants up thighs    Status Achieved      OT SHORT TERM GOAL #3   Title Patient will transition from sit to stand with mod assist in prep for tolieting on commode    Status Achieved      OT SHORT TERM GOAL #4   Title Patient/caregiver will don/doff custom RUE splint with min cueing    Status Deferred             OT Long Term Goals - 12/11/19 1840      OT LONG TERM GOAL #  4   Title Patient will attend to right arm, and manage right arm during transitional movements and in wheelchair with min cueing    Status On-going      OT LONG TERM GOAL #5   Title Patient will stand with min assist for sufficient time that caregiver can complete toilet hygiene and clothing management due 9/24    Status On-going                 Plan - 12/11/19 1840    Clinical Impression Statement Patient continues to show steady improvement in active standing, and static standing tolerance.    OT Frequency 2x / week    OT Duration 6 weeks    OT Treatment/Interventions Self-care/ADL training;Electrical Stimulation;Therapeutic exercise;Visual/perceptual remediation/compensation;Patient/family education;Splinting;Neuromuscular education;Therapeutic activities;Balance training;Cognitive remediation/compensation;Passive range of motion;Manual Therapy;DME and/or AE instruction    Plan Postural control, sitting balance, sit to lift off, weight shift right, lower body ADL weight shifts, NMR RUE    Consulted and Agree with Plan of Care Patient;Family  member/caregiver    Family Member Consulted dtr [name]           Patient will benefit from skilled therapeutic intervention in order to improve the following deficits and impairments:           Visit Diagnosis: Hemiplegia and hemiparesis following cerebral infarction affecting right dominant side (HCC)  Unsteadiness on feet  Muscle weakness (generalized)  Attention and concentration deficit  Neurologic neglect syndrome  Stiffness of right shoulder, not elsewhere classified  Localized edema    Problem List Patient Active Problem List   Diagnosis Date Noted  . Chronic respiratory failure with hypoxia ([place]) 11/07/2019  . Pressure injury of skin 11/05/2019  . Acute renal failure superimposed on stage 4 chronic kidney disease ([place]) 11/04/2019  . Right hemiparesis ([place]) 11/04/2019  . Atrial fibrillation, chronic ([place]) 11/04/2019  . Severe sepsis ([place]) 11/04/2019  . CAP (community acquired pneumonia) 11/04/2019  . Dysphagia 11/04/2019  . Sepsis secondary to UTI ([place]) 11/03/2019  . 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 CKD (chronic kidney disease) stage 4, GFR 15-29 ml/min (HCC) 09/29/2019  . Thrombocytopenia ([place]) 09/29/2019  . Personal history of DVT (deep vein thrombosis) 09/09/2019  . History of ischemic stroke 09/09/2019  . CAD (coronary artery disease) 09/09/2019  . Chronic constipation 09/09/2019  . History of colorectal cancer 09/09/2019  . 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 Right arm weakness 09/09/2019  . Right leg weakness 09/09/2019  . 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 [place], [place], [address] Phone: [phone]   Fax:  [phone]  Name: Robert Mcconnell MRN: [id number] Date of Birth: Jul 14, 1921

## 2019-12-11 NOTE — Therapy (Signed)
[facility] [address] [place], [place], [address] Phone: [phone]   Fax:  [phone]  Physical Therapy Treatment  Patient Details  Name: Robert Mcconnell MRN: [id number] Date of Birth: January 26, 1922 Referring Provider (PT): Arnell Sieving   Encounter Date: 12/11/2019   PT End of Session - 12/11/19 2212    Visit Number 25    Number of Visits 32    Date for PT Re-Evaluation 12/25/19    Authorization Type VA    Authorization Time Period 15 visits from 7/8-11/5    Authorization - Visit Number 9    Authorization - Number of Visits 15    Progress Note Due on Visit 15    PT Start Time 1324    PT Stop Time 1750    PT Time Calculation (min) 45 min    Equipment Utilized During Treatment Gait belt    Activity Tolerance Patient tolerated treatment well    Behavior During Therapy [facility] for tasks assessed/performed           Past Medical History:  Diagnosis Date  . Atrial fibrillation ([place])   . BPH (benign prostatic hyperplasia)   . CKD (chronic kidney disease)   . History of coronary angioplasty with insertion of stent   . Hypertension   . Severe sepsis ([place]) 11/04/2019  . Stroke [facility])     Past Surgical History:  Procedure Laterality Date  . COLECTOMY  2006   cancer    There were no vitals filed for this visit.   Subjective Assessment - 12/11/19 2201    Subjective no new complaints    Patient is accompained by: Family member    Pertinent History CVA    Limitations Standing;Walking;House hold activities;Sitting    How long can you sit comfortably? 1-2 hours    How long can you stand comfortably? <5 min    How long can you walk comfortably? unable              Power chair to mat table trasnfer and mat table to Nustep transfer with slide board: mod A  Sit to lateral prop on elbow: 20x R and L, 2 pillows on R, to mat table on L Sit to supine: mod A Supine to sit: mod to max A  Supine SLR: 10x R  and L, AA on R Supine heel slides: 20x AA on R Supine hooklying manually reisted hip abduction and hip adduction: 20x Supine hamstring curls with ball: 20x AA R only Supine leg press unilateral with manual resistance: 20x R only  Nustep: level 3 with arms and legs: level 3 for 5'                          PT Short Term Goals - 12/01/19 1839      PT SHORT TERM GOAL #1   Title Patient will be able to perform sit to stand with mod A with or without AD to improve independence and reduce care giver burden    Baseline max A (eval); mod to max A (10/30/19), mox to max A (12/01/19)    Time 4    Period Weeks    Status On-going    Target Date 11/27/19      PT SHORT TERM GOAL #2   Title pt will be able to stand with mod A for 5 min to perform small reaching with L UE to improve standing balance.    Baseline able  to stand with holding on to parallel bars with L UE but needs stabilization at knees to improve standing tolerance (max A required overall) is able to reach with max A, requires mod to max A but unable to hold for 5 min while performing reaching    Time 4    Period Weeks    Status On-going      PT SHORT TERM GOAL #3   Title Pt will be able to propel chair with SBA  for 30 feet to improve household navigation    Baseline Able to propel chair with use of L UE and L LE with mod A for 30 feet (10/30/19); patient has power chair now (12/01/19)    Time 4    Period Weeks    Status Not Met    Target Date 10/06/19             PT Long Term Goals - 10/30/19 1812      PT LONG TERM GOAL #1   Title Patient will be able to stand for 5 min with CGA to improve standing endurance with or without AD    Baseline requires max A    Time 8    Period Weeks    Status On-going    Target Date 12/25/19      PT LONG TERM GOAL #2   Title Pt will require CGA with sit to stand and chair to chair transfers to improve indepdnence and reduce caregiver burden.    Baseline max to total A     Time 8    Period Weeks    Status On-going    Target Date 12/25/19      PT LONG TERM GOAL #3   Title Patient will be able to walk into walk-in shower and sit on shower chair with CGA to improve independence.    Baseline bed sponge bath only    Time 8    Period Weeks    Status On-going    Target Date 12/25/19      PT LONG TERM GOAL #4   Title Pt will demo >10/56 on BBS to improve static balance and reduce fall risk    Baseline BBS 4/56 (eval)    Time 8    Period Weeks    Status On-going    Target Date 12/25/19                 Plan - 12/11/19 2201    Clinical Impression Statement Patient was able to perform lateral slide board trasnfers with mod A and was able to use L UE to help push and pull during the transfers. Patient is starting to demonstrate some muscle activation in his hip flexors in R LE and quads.    Personal Factors and Comorbidities Age;Comorbidity 3+    Comorbidities hx of stroke, hx of colorectal cancer    Examination-Activity Limitations Bathing;Bed Mobility;Caring for Others;Carry;Locomotion Level;Hygiene/Grooming;Dressing;Continence;Self Feeding;Reach Overhead;Sit;Lift;Other;Transfers;Toileting;Stand;Stairs    Examination-Participation Restrictions Church;Cleaning;Community Activity;Driving;Meal Prep;Volunteer;Shop;Medication Management;Laundry;Personal Finances;Yard Work;Interpersonal Relationship    Stability/Clinical Decision Making Unstable/Unpredictable    Rehab Potential Good    PT Frequency 2x / week    PT Duration 8 weeks    PT Treatment/Interventions ADLs/Self Care Home Management;Electrical Stimulation;Gait training;Stair training;Functional mobility training;Therapeutic exercise;Balance training;Neuromuscular re-education;Therapeutic activities;Patient/family education;Wheelchair mobility training;Manual techniques;Passive range of motion    PT Next Visit Plan continue to work on sit to stand transfers, chair to chair transfers, wheelchair  mobility           Patient  will benefit from skilled therapeutic intervention in order to improve the following deficits and impairments:  Abnormal gait, Decreased activity tolerance, Decreased coordination, Decreased safety awareness, Decreased strength, Impaired flexibility, Impaired UE functional use, Postural dysfunction, Impaired tone, Increased edema, Decreased range of motion, Decreased knowledge of precautions, Impaired sensation, Difficulty walking, Decreased mobility, Decreased endurance, Decreased balance  Visit Diagnosis: Hemiplegia and hemiparesis following cerebral infarction affecting right dominant side (HCC)  Unsteadiness on feet  Muscle weakness (generalized)     Problem List Patient Active Problem List   Diagnosis Date Noted  . Chronic respiratory failure with hypoxia ([place]) 11/07/2019  . Pressure injury of skin 11/05/2019  . Acute renal failure superimposed on stage 4 chronic kidney disease ([place]) 11/04/2019  . Right hemiparesis ([place]) 11/04/2019  . Atrial fibrillation, chronic ([place]) 11/04/2019  . Severe sepsis ([place]) 11/04/2019  . CAP (community acquired pneumonia) 11/04/2019  . Dysphagia 11/04/2019  . Sepsis secondary to UTI ([place]) 11/03/2019  . 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 CKD (chronic kidney disease) stage 4, GFR 15-29 ml/min (HCC) 09/29/2019  . Thrombocytopenia ([place]) 09/29/2019  . Personal history of DVT (deep vein thrombosis) 09/09/2019  . History of ischemic stroke 09/09/2019  . CAD (coronary artery disease) 09/09/2019  . Chronic constipation 09/09/2019  . History of colorectal cancer 09/09/2019  . 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 Right arm weakness 09/09/2019  . Right leg weakness 09/09/2019  . 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 [place], [place], [address] Phone:  [phone]   Fax:  [phone]  Name: Robert Mcconnell MRN: [id number] Date of Birth: [birth year]

## 2019-12-16 ENCOUNTER — Ambulatory Visit: Payer: No Typology Code available for payment source | Admitting: Occupational Therapy

## 2019-12-16 ENCOUNTER — Other Ambulatory Visit: Payer: Self-pay

## 2019-12-16 ENCOUNTER — Ambulatory Visit: Payer: No Typology Code available for payment source

## 2019-12-16 ENCOUNTER — Encounter: Payer: Medicare Other | Admitting: Speech Pathology

## 2019-12-16 ENCOUNTER — Encounter: Payer: Self-pay | Admitting: Occupational Therapy

## 2019-12-16 DIAGNOSIS — I69351 Hemiplegia and hemiparesis following cerebral infarction affecting right dominant side: Secondary | ICD-10-CM

## 2019-12-16 DIAGNOSIS — Z8673 Personal history of transient ischemic attack (TIA), and cerebral infarction without residual deficits: Secondary | ICD-10-CM | POA: Diagnosis not present

## 2019-12-16 NOTE — Therapy (Signed)
[facility] [address] [place], [place], [address] Phone: [phone]   Fax:  [phone]  Occupational Therapy Treatment  Patient Details  Name: Robert Mcconnell MRN: [id number] Date of Birth: [birth year] Referring Provider (OT): Eliezer Lofts   Encounter Date: 12/16/2019   OT End of Session - 12/16/19 1905    Visit Number 15    Number of Visits 26    Date for OT Re-Evaluation 01/30/20    Authorization Type VA    Authorization - Visit Number 13    Authorization - Number of Visits 30    Progress Note Due on Visit 20    OT Start Time 2831    OT Stop Time 1830    OT Time Calculation (min) 42 min    Activity Tolerance Patient tolerated treatment well    Behavior During Therapy [facility] for tasks assessed/performed           Past Medical History:  Diagnosis Date  . Atrial fibrillation ([place])   . BPH (benign prostatic hyperplasia)   . CKD (chronic kidney disease)   . History of coronary angioplasty with insertion of stent   . Hypertension   . Severe sepsis ([place]) 11/04/2019  . Stroke [facility])     Past Surgical History:  Procedure Laterality Date  . COLECTOMY  2006   cancer    There were no vitals filed for this visit.   Subjective Assessment - 12/16/19 1900    Subjective  This hand - no good    Patient is accompanied by: Family member    Currently in Pain? No/denies    Pain Score 0-No pain                        OT Treatments/Exercises (OP) - 12/16/19 0001      Neurological Re-education Exercises   Other Exercises 1 Continuing to work on components of sit to stand and stand balance as needed for toileting.  Patient needs LUE support to pull to stand, and  at times lacks sufficient postural control for forward weight shift - tends to fall toward right side.  Worked for improved alignment and control of trunk, then less support for sit to stand transition.  Patient with severe perceptual and sensory  deficits - so does best when safe to allow himself to move versus over cueing or handling.  Patient able to transfer self out of wheelchair without assistance (toward left) and back into w/c at end of session with mod assist (toward right)  Patient with ability to maintain some tension in finger flexors to hold an object (weighted clothespin) and tro rotate forearm - pro/supination.  Able to facilitate wrist extension with tapping - patient bruises very easily - so cautious with such facilitation.                      OT Short Term Goals - 12/16/19 1907      OT SHORT TERM GOAL #1   Title Patient will complete lower body bathing with max assist due 6/19      OT SHORT TERM GOAL #2   Title Patient will bend forward while seated to pull pants up thighs    Status Achieved      OT SHORT TERM GOAL #3   Title Patient will transition from sit to stand with mod assist in prep for tolieting on commode    Status Achieved      OT  SHORT TERM GOAL #4   Title Patient/caregiver will don/doff custom RUE splint with min cueing    Status Deferred             OT Long Term Goals - 12/16/19 1907      OT LONG TERM GOAL #1   Title Patient will transiton from sit to stand with mod assist to allow toileting, clothing management, and hygiene    Status Achieved      OT LONG TERM GOAL #2   Title Patient/caregiver will complete a home exercise program to prevent pain, and improve passive motion in RUE to aide with hygiene and dressing tasks    Status Achieved      OT LONG TERM GOAL #3   Title Patient / caregiver will demonstrate effective edema management techniques for right hand, and support for right arm to prevent further subluxation    Status Achieved      OT LONG TERM GOAL #4   Title Patient will attend to right arm, and manage right arm during transitional movements and in wheelchair with min cueing    Status On-going      OT LONG TERM GOAL #5   Title Patient will stand with min assist  for sufficient time that caregiver can complete toilet hygiene and clothing management due 9/24    Status New      OT LONG TERM GOAL #6   Title Patient will complete shower transfer into and out of tub with moderate assistance    Time 8    Period Weeks    Status New                 Plan - 12/16/19 1906    Clinical Impression Statement Patient pparticipates in therapy sessions despite age and severe sensory and perceptual deficits.    OT Occupational Profile and History Detailed Assessment- Review of Records and additional review of physical, cognitive, psychosocial history related to current functional performance    Occupational performance deficits (Please refer to evaluation for details): ADL's;Rest and Sleep    Body Structure / Function / Physical Skills ADL;Coordination;Endurance;GMC;UE functional use;Balance;Decreased knowledge of precautions;Sensation;Pain;Flexibility;Body mechanics;Decreased knowledge of use of DME;FMC;Proprioception;Strength;Tone;ROM;Edema;Continence;Mobility    Cognitive Skills Attention;Emotional;Energy/Drive;Memory;Orientation;Perception;Problem Solve;Safety Awareness;Sequencing    Rehab Potential Fair    Clinical Decision Making Several treatment options, min-mod task modification necessary    Comorbidities Affecting Occupational Performance: May have comorbidities impacting occupational performance    Modification or Assistance to Complete Evaluation  Min-Moderate modification of tasks or assist with assess necessary to complete eval    OT Frequency 2x / week    OT Duration 6 weeks    OT Treatment/Interventions Self-care/ADL training;Electrical Stimulation;Therapeutic exercise;Visual/perceptual remediation/compensation;Patient/family education;Splinting;Neuromuscular education;Therapeutic activities;Balance training;Cognitive remediation/compensation;Passive range of motion;Manual Therapy;DME and/or AE instruction    Plan Postural control, sitting  balance, sit to lift off, weight shift right, lower body ADL weight shifts, NMR RUE    OT Home Exercise Plan SLiding right hand on thigh, tilted stool    Consulted and Agree with Plan of Care Patient;Family member/caregiver    Family Member Consulted dtr [name]           Patient will benefit from skilled therapeutic intervention in order to improve the following deficits and impairments:   Body Structure / Function / Physical Skills: ADL, Coordination, Endurance, GMC, UE functional use, Balance, Decreased knowledge of precautions, Sensation, Pain, Flexibility, Body mechanics, Decreased knowledge of use of DME, FMC, Proprioception, Strength, Tone, ROM, Edema, Continence, Mobility Cognitive Skills: Attention, Emotional,  Energy/Drive, Memory, Orientation, Perception, Problem Solve, Safety Awareness, Sequencing     Visit Diagnosis: Hemiplegia and hemiparesis following cerebral infarction affecting right dominant side [facility])    Problem List Patient Active Problem List   Diagnosis Date Noted  . Chronic respiratory failure with hypoxia ([place]) 11/07/2019  . Pressure injury of skin 11/05/2019  . Acute renal failure superimposed on stage 4 chronic kidney disease ([place]) 11/04/2019  . Right hemiparesis ([place]) 11/04/2019  . Atrial fibrillation, chronic ([place]) 11/04/2019  . Severe sepsis ([place]) 11/04/2019  . CAP (community acquired pneumonia) 11/04/2019  . Dysphagia 11/04/2019  . Sepsis secondary to UTI ([place]) 11/03/2019  . 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 CKD (chronic kidney disease) stage 4, GFR 15-29 ml/min (HCC) 09/29/2019  . Thrombocytopenia ([place]) 09/29/2019  . Personal history of DVT (deep vein thrombosis) 09/09/2019  . History of ischemic stroke 09/09/2019  . CAD (coronary artery disease) 09/09/2019  . Chronic constipation 09/09/2019  . History of colorectal cancer 09/09/2019  . 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 Right arm weakness  09/09/2019  . Right leg weakness 09/09/2019  . 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 [place], [place], [address] Phone: [phone]   Fax:  [phone]  Name: Robert Mcconnell MRN: [id number] Date of Birth: 07/31/1921

## 2019-12-17 ENCOUNTER — Encounter: Payer: Self-pay | Admitting: Family Medicine

## 2019-12-18 ENCOUNTER — Encounter: Payer: Self-pay | Admitting: Occupational Therapy

## 2019-12-18 ENCOUNTER — Ambulatory Visit: Payer: No Typology Code available for payment source | Admitting: Occupational Therapy

## 2019-12-18 ENCOUNTER — Other Ambulatory Visit: Payer: Self-pay

## 2019-12-18 ENCOUNTER — Encounter: Payer: Medicare Other | Admitting: Speech Pathology

## 2019-12-18 DIAGNOSIS — M6281 Muscle weakness (generalized): Secondary | ICD-10-CM

## 2019-12-18 DIAGNOSIS — R600 Localized edema: Secondary | ICD-10-CM

## 2019-12-18 DIAGNOSIS — I69351 Hemiplegia and hemiparesis following cerebral infarction affecting right dominant side: Secondary | ICD-10-CM

## 2019-12-18 DIAGNOSIS — Z8673 Personal history of transient ischemic attack (TIA), and cerebral infarction without residual deficits: Secondary | ICD-10-CM | POA: Diagnosis not present

## 2019-12-18 DIAGNOSIS — R2681 Unsteadiness on feet: Secondary | ICD-10-CM

## 2019-12-18 DIAGNOSIS — R414 Neurologic neglect syndrome: Secondary | ICD-10-CM

## 2019-12-18 DIAGNOSIS — M25611 Stiffness of right shoulder, not elsewhere classified: Secondary | ICD-10-CM

## 2019-12-18 DIAGNOSIS — R41840 Attention and concentration deficit: Secondary | ICD-10-CM

## 2019-12-18 NOTE — Therapy (Signed)
[facility] [address] [place], [place], [address] Phone: [phone]   Fax:  [phone]  Occupational Therapy Treatment  Patient Details  Name: Robert Mcconnell MRN: [id number] Date of Birth: [birth year] Referring Provider (OT): Eliezer Lofts   Encounter Date: 12/18/2019   OT End of Session - 12/18/19 1851    Visit Number 16    Number of Visits 26    Date for OT Re-Evaluation 01/30/20    Authorization Type VA    Authorization - Visit Number 16    Authorization - Number of Visits 30    Progress Note Due on Visit 20    OT Start Time 1615    OT Stop Time 1700    OT Time Calculation (min) 45 min    Activity Tolerance Patient tolerated treatment well    Behavior During Therapy [facility] for tasks assessed/performed           Past Medical History:  Diagnosis Date  . Atrial fibrillation ([place])   . BPH (benign prostatic hyperplasia)   . CKD (chronic kidney disease)   . History of coronary angioplasty with insertion of stent   . Hypertension   . Severe sepsis ([place]) 11/04/2019  . Stroke [facility])     Past Surgical History:  Procedure Laterality Date  . COLECTOMY  2006   cancer    There were no vitals filed for this visit.   Subjective Assessment - 12/18/19 1846    Subjective  Patient indicates no pain    Patient is accompanied by: Family member    Currently in Pain? No/denies    Pain Score 0-No pain                        OT Treatments/Exercises (OP) - 12/18/19 0001      ADLs   Toileting Continuing to work toward improved standing balance and stand tolerance.  In parallel bars with tibila strap in place, patient able to pull himself to standing - and stand for several minutes with intermittent min assistance.  The overall goal is to have him safely stand long enough for daughter (only caregiver who can provide physical assistance) to perform toilet hygiene and assist with clothing management.  Patient  today once standing able to balance self while left hand engaged in a novel activity (connect four game)      Bathing Reviewed requirements for showering, and daughter to take a picture of shower and shower bench at home.  Daughter does not yet feel this is safe to transfer him into/out of shower, so will use therapy time to trouble shoot potential problems to help patient shower.  This is one of his primary goals.                      OT Short Term Goals - 12/16/19 1907      OT SHORT TERM GOAL #1   Title Patient will complete lower body bathing with max assist due 6/19      OT SHORT TERM GOAL #2   Title Patient will bend forward while seated to pull pants up thighs    Status Achieved      OT SHORT TERM GOAL #3   Title Patient will transition from sit to stand with mod assist in prep for tolieting on commode    Status Achieved      OT SHORT TERM GOAL #4   Title Patient/caregiver will don/doff custom RUE  splint with min cueing    Status Deferred             OT Long Term Goals - 12/16/19 1907      OT LONG TERM GOAL #1   Title Patient will transiton from sit to stand with mod assist to allow toileting, clothing management, and hygiene    Status Achieved      OT LONG TERM GOAL #2   Title Patient/caregiver will complete a home exercise program to prevent pain, and improve passive motion in RUE to aide with hygiene and dressing tasks    Status Achieved      OT LONG TERM GOAL #3   Title Patient / caregiver will demonstrate effective edema management techniques for right hand, and support for right arm to prevent further subluxation    Status Achieved      OT LONG TERM GOAL #4   Title Patient will attend to right arm, and manage right arm during transitional movements and in wheelchair with min cueing    Status On-going      OT LONG TERM GOAL #5   Title Patient will stand with min assist for sufficient time that caregiver can complete toilet hygiene and clothing  management due 9/24    Status New      OT LONG TERM GOAL #6   Title Patient will complete shower transfer into and out of tub with moderate assistance    Time 8    Period Weeks    Status New                 Plan - 12/18/19 1851    Clinical Impression Statement Patient does well with familiar activities and set ups.  He requires rote repetition with subtle gradient changes to progress.    OT Occupational Profile and History Detailed Assessment- Review of Records and additional review of physical, cognitive, psychosocial history related to current functional performance    Occupational performance deficits (Please refer to evaluation for details): ADL's;Rest and Sleep    Body Structure / Function / Physical Skills ADL;Coordination;Endurance;GMC;UE functional use;Balance;Decreased knowledge of precautions;Sensation;Pain;Flexibility;Body mechanics;Decreased knowledge of use of DME;FMC;Proprioception;Strength;Tone;ROM;Edema;Continence;Mobility    Cognitive Skills Attention;Emotional;Energy/Drive;Memory;Orientation;Perception;Problem Solve;Safety Awareness;Sequencing    Rehab Potential Fair    Clinical Decision Making Several treatment options, min-mod task modification necessary    Comorbidities Affecting Occupational Performance: May have comorbidities impacting occupational performance    Modification or Assistance to Complete Evaluation  Min-Moderate modification of tasks or assist with assess necessary to complete eval    OT Frequency 2x / week    OT Duration 6 weeks    OT Treatment/Interventions Self-care/ADL training;Electrical Stimulation;Therapeutic exercise;Visual/perceptual remediation/compensation;Patient/family education;Splinting;Neuromuscular education;Therapeutic activities;Balance training;Cognitive remediation/compensation;Passive range of motion;Manual Therapy;DME and/or AE instruction    Plan Assess shower pictures, Postural control, sitting balance, sit to lift off,  weight shift right, lower body ADL weight shifts, NMR RUE    OT Home Exercise Plan SLiding right hand on thigh, tilted stool    Consulted and Agree with Plan of Care Patient;Family member/caregiver    Family Member Consulted dtr [name]           Patient will benefit from skilled therapeutic intervention in order to improve the following deficits and impairments:   Body Structure / Function / Physical Skills: ADL, Coordination, Endurance, GMC, UE functional use, Balance, Decreased knowledge of precautions, Sensation, Pain, Flexibility, Body mechanics, Decreased knowledge of use of DME, FMC, Proprioception, Strength, Tone, ROM, Edema, Continence, Mobility Cognitive Skills: Attention, Emotional, Energy/Drive, Memory,  Orientation, Perception, Problem Solve, Safety Awareness, Sequencing     Visit Diagnosis: Hemiplegia and hemiparesis following cerebral infarction affecting right dominant side (HCC)  Unsteadiness on feet  Muscle weakness (generalized)  Attention and concentration deficit  Neurologic neglect syndrome  Stiffness of right shoulder, not elsewhere classified  Localized edema    Problem List Patient Active Problem List   Diagnosis Date Noted  . Chronic respiratory failure with hypoxia ([place]) 11/07/2019  . Pressure injury of skin 11/05/2019  . Acute renal failure superimposed on stage 4 chronic kidney disease ([place]) 11/04/2019  . Right hemiparesis ([place]) 11/04/2019  . Atrial fibrillation, chronic ([place]) 11/04/2019  . Severe sepsis ([place]) 11/04/2019  . CAP (community acquired pneumonia) 11/04/2019  . Dysphagia 11/04/2019  . Sepsis secondary to UTI ([place]) 11/03/2019  . 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 CKD (chronic kidney disease) stage 4, GFR 15-29 ml/min (HCC) 09/29/2019  . Thrombocytopenia ([place]) 09/29/2019  . Personal history of DVT (deep vein thrombosis) 09/09/2019  . History of ischemic stroke 09/09/2019  . CAD (coronary artery  disease) 09/09/2019  . Chronic constipation 09/09/2019  . History of colorectal cancer 09/09/2019  . 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 Right arm weakness 09/09/2019  . Right leg weakness 09/09/2019  . 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 [place], [place], [address] Phone: [phone]   Fax:  [phone]  Name: Meer Reindl MRN: [id number] Date of Birth: 1922-05-03

## 2019-12-25 ENCOUNTER — Encounter: Payer: Medicare Other | Admitting: Speech Pathology

## 2019-12-25 ENCOUNTER — Ambulatory Visit: Payer: No Typology Code available for payment source

## 2019-12-25 ENCOUNTER — Other Ambulatory Visit: Payer: Self-pay

## 2019-12-25 DIAGNOSIS — I69351 Hemiplegia and hemiparesis following cerebral infarction affecting right dominant side: Secondary | ICD-10-CM

## 2019-12-25 DIAGNOSIS — Z8673 Personal history of transient ischemic attack (TIA), and cerebral infarction without residual deficits: Secondary | ICD-10-CM | POA: Diagnosis not present

## 2019-12-25 DIAGNOSIS — R2681 Unsteadiness on feet: Secondary | ICD-10-CM

## 2019-12-25 DIAGNOSIS — M6281 Muscle weakness (generalized): Secondary | ICD-10-CM

## 2019-12-25 NOTE — Therapy (Signed)
[facility] [address] [place], [place], [address] Phone: [phone]   Fax:  [phone]  Physical Therapy Treatment  Patient Details  Name: Ruffus Kamaka MRN: [id number] Date of Birth: 01-09-1922 Referring Provider (PT): Arnell Sieving   Encounter Date: 12/25/2019   PT End of Session - 12/25/19 1816    Visit Number 26    Number of Visits 32    Date for PT Re-Evaluation 12/25/19    Authorization Type [facility]    Authorization Time Period 15 visits from 7/8-11/5    Authorization - Visit Number 10    Authorization - Number of Visits 15    Progress Note Due on Visit 15    PT Start Time 0737    PT Stop Time 1750    PT Time Calculation (min) 45 min    Equipment Utilized During Treatment Gait belt    Activity Tolerance Treatment limited secondary to agitation    Behavior During Therapy [facility] for tasks assessed/performed           Past Medical History:  Diagnosis Date  . Atrial fibrillation ([place])   . BPH (benign prostatic hyperplasia)   . CKD (chronic kidney disease)   . History of coronary angioplasty with insertion of stent   . Hypertension   . Severe sepsis ([place]) 11/04/2019  . Stroke [facility])     Past Surgical History:  Procedure Laterality Date  . COLECTOMY  2006   cancer    There were no vitals filed for this visit.   Subjective Assessment - 12/25/19 1810    Subjective Daughter reports they continue to practice transfers and standing at home. Daughter showed picture of how tall he was standing with holding onto bed rail in front.    Patient is accompained by: Family member    Pertinent History CVA    Limitations Standing;Walking;House hold activities;Sitting    How long can you sit comfortably? 1-2 hours    How long can you stand comfortably? <5 min    How long can you walk comfortably? unable    Patient Stated Goals walk    Currently in Pain? No/denies                 Treatment: Please  see clinical impression statement for detailed treatment                      PT Short Term Goals - 12/01/19 1839      PT SHORT TERM GOAL #1   Title Patient will be able to perform sit to stand with mod A with or without AD to improve independence and reduce care giver burden    Baseline max A (eval); mod to max A (10/30/19), mox to max A (12/01/19)    Time 4    Period Weeks    Status On-going    Target Date 11/27/19      PT SHORT TERM GOAL #2   Title pt will be able to stand with mod A for 5 min to perform small reaching with L UE to improve standing balance.    Baseline able to stand with holding on to parallel bars with L UE but needs stabilization at knees to improve standing tolerance (max A required overall) is able to reach with max A, requires mod to max A but unable to hold for 5 min while performing reaching    Time 4    Period Weeks    Status  On-going      PT SHORT TERM GOAL #3   Title Pt will be able to propel chair with SBA  for 30 feet to improve household navigation    Baseline Able to propel chair with use of L UE and L LE with mod A for 30 feet (10/30/19); patient has power chair now (12/01/19)    Time 4    Period Weeks    Status Not Met    Target Date 10/06/19             PT Long Term Goals - 10/30/19 1812      PT LONG TERM GOAL #1   Title Patient will be able to stand for 5 min with CGA to improve standing endurance with or without AD    Baseline requires max A    Time 8    Period Weeks    Status On-going    Target Date 12/25/19      PT LONG TERM GOAL #2   Title Pt will require CGA with sit to stand and chair to chair transfers to improve indepdnence and reduce caregiver burden.    Baseline max to total A    Time 8    Period Weeks    Status On-going    Target Date 12/25/19      PT LONG TERM GOAL #3   Title Patient will be able to walk into walk-in shower and sit on shower chair with CGA to improve independence.    Baseline bed  sponge bath only    Time 8    Period Weeks    Status On-going    Target Date 12/25/19      PT LONG TERM GOAL #4   Title Pt will demo >10/56 on BBS to improve static balance and reduce fall risk    Baseline BBS 4/56 (eval)    Time 8    Period Weeks    Status On-going    Target Date 12/25/19                 Plan - 12/25/19 1811    Clinical Impression Statement Patient was bothered by BM that he had during the session and he was very self conscious about that which affected his participation in therapy. We attempted to go from power chair to mat table using hemi walker today. patient needed mod to max A with sit to stand. Patient was able to stabilize standing balance for 10-15 sec with hemi walker and CGA but then has excessive posterior and lateral to R lean for which he required mod to max A. Patient was able to move hemi walker forward couple of times with min A to stabilize balance and was able to shuffle Left leg laterally couple of times with min A and using hemi walker. Patient had uncontrolled descent today with sit to stand transfers. Pt needs max A x 2 to scoot back in the chair. We then practiced standing at parallel bar and reaching to left with left arm in multiple direction to improve weight shift to left leg. Pt had more difficulty reaching towards his right side crossing over to mid line and couldn't let go of bar. We practiced on sitting manually resisted hip abduction and adduction to improve hip control. We attempted to walk in parallel bars with max A x 1 and use of parallel bars, needed max A to stabilize bil knees and to advance R leg forward. Green band for dorsiflexion assist and shoe  glide was put on patient for the entire session.    Personal Factors and Comorbidities Age;Comorbidity 3+    Comorbidities hx of stroke, hx of colorectal cancer    Examination-Activity Limitations Bathing;Bed Mobility;Caring for Others;Carry;Locomotion  Level;Hygiene/Grooming;Dressing;Continence;Self Feeding;Reach Overhead;Sit;Lift;Other;Transfers;Toileting;Stand;Stairs           Patient will benefit from skilled therapeutic intervention in order to improve the following deficits and impairments:  Abnormal gait, Decreased activity tolerance, Decreased coordination, Decreased safety awareness, Decreased strength, Impaired flexibility, Impaired UE functional use, Postural dysfunction, Impaired tone, Increased edema, Decreased range of motion, Decreased knowledge of precautions, Impaired sensation, Difficulty walking, Decreased mobility, Decreased endurance, Decreased balance  Visit Diagnosis: Hemiplegia and hemiparesis following cerebral infarction affecting right dominant side (HCC)  Unsteadiness on feet  Muscle weakness (generalized)     Problem List Patient Active Problem List   Diagnosis Date Noted  . Chronic respiratory failure with hypoxia ([place]) 11/07/2019  . Pressure injury of skin 11/05/2019  . Acute renal failure superimposed on stage 4 chronic kidney disease ([place]) 11/04/2019  . Right hemiparesis ([place]) 11/04/2019  . Atrial fibrillation, chronic ([place]) 11/04/2019  . Severe sepsis ([place]) 11/04/2019  . CAP (community acquired pneumonia) 11/04/2019  . Dysphagia 11/04/2019  . Sepsis secondary to UTI ([place]) 11/03/2019  . 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 CKD (chronic kidney disease) stage 4, GFR 15-29 ml/min (HCC) 09/29/2019  . Thrombocytopenia ([place]) 09/29/2019  . Personal history of DVT (deep vein thrombosis) 09/09/2019  . History of ischemic stroke 09/09/2019  . CAD (coronary artery disease) 09/09/2019  . Chronic constipation 09/09/2019  . History of colorectal cancer 09/09/2019  . 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 Right arm weakness 09/09/2019  . Right leg weakness 09/09/2019  . 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 [place], [place], [address] Phone: [phone]   Fax:  [phone]  Name: Hanford Lust MRN: [id number] Date of Birth: [birth year]

## 2019-12-30 ENCOUNTER — Encounter: Payer: Medicare Other | Admitting: Speech Pathology

## 2019-12-30 ENCOUNTER — Ambulatory Visit: Payer: No Typology Code available for payment source

## 2019-12-30 ENCOUNTER — Ambulatory Visit: Payer: No Typology Code available for payment source | Admitting: Occupational Therapy

## 2019-12-30 ENCOUNTER — Encounter: Payer: Self-pay | Admitting: Occupational Therapy

## 2019-12-30 ENCOUNTER — Other Ambulatory Visit: Payer: Self-pay

## 2019-12-30 DIAGNOSIS — R600 Localized edema: Secondary | ICD-10-CM

## 2019-12-30 DIAGNOSIS — R2681 Unsteadiness on feet: Secondary | ICD-10-CM

## 2019-12-30 DIAGNOSIS — R41840 Attention and concentration deficit: Secondary | ICD-10-CM

## 2019-12-30 DIAGNOSIS — M6281 Muscle weakness (generalized): Secondary | ICD-10-CM

## 2019-12-30 DIAGNOSIS — R414 Neurologic neglect syndrome: Secondary | ICD-10-CM

## 2019-12-30 DIAGNOSIS — I69351 Hemiplegia and hemiparesis following cerebral infarction affecting right dominant side: Secondary | ICD-10-CM

## 2019-12-30 DIAGNOSIS — M25611 Stiffness of right shoulder, not elsewhere classified: Secondary | ICD-10-CM

## 2019-12-30 DIAGNOSIS — Z8673 Personal history of transient ischemic attack (TIA), and cerebral infarction without residual deficits: Secondary | ICD-10-CM | POA: Diagnosis not present

## 2019-12-30 NOTE — Therapy (Signed)
[facility] [address] [place], [place], [address] Phone: [phone]   Fax:  [phone]  Occupational Therapy Treatment  Patient Details  Name: Robert Mcconnell MRN: [id number] Date of Birth: 09-25-1921 Referring Provider (OT): Eliezer Lofts   Encounter Date: 12/30/2019   OT End of Session - 12/30/19 1847    Visit Number 17    Number of Visits 26    Date for OT Re-Evaluation 01/30/20    Authorization Type VA    Authorization - Visit Number 11    Authorization - Number of Visits 30    Progress Note Due on Visit 20    OT Start Time 1747    OT Stop Time 1830    OT Time Calculation (min) 43 min    Activity Tolerance Other (comment)    Behavior During Therapy Restless           Past Medical History:  Diagnosis Date  . Atrial fibrillation ([place])   . BPH (benign prostatic hyperplasia)   . CKD (chronic kidney disease)   . History of coronary angioplasty with insertion of stent   . Hypertension   . Severe sepsis ([place]) 11/04/2019  . Stroke [facility])     Past Surgical History:  Procedure Laterality Date  . COLECTOMY  2006   cancer    There were no vitals filed for this visit.   Subjective Assessment - 12/30/19 1841    Subjective  Family indicates concern that is becoming more challenging to roll him toward left side at home    Patient is accompanied by: Family member    Currently in Pain? No/denies    Pain Score 0-No pain                        OT Treatments/Exercises (OP) - 12/30/19 0001      ADLs   Functional Mobility Worked on rolling supine to sidelying in both directions per family's request.  This is current method to change patient's brief.  Patient with significant perceptual issues, and initially showing signs of fear when rolling toward left side.  Patient moved toward center of mat table for adequate spacing, and then assisted legs partially to begin rolling toward left.  With cueing  and minimal facilitation able to activate right trunk and UE off surface to roll left.  Patient able to repeat this pattern multiple times with family observing.  Encouraged daughter to try to bias legs toward side he is rolling to give input, and begin motion.,  Worked on sit to stand with less UE relaince and less LE support.  Sit to stand at raised table to sort cards - red and black.  This was new set up for patient with less support.  Patient able to stand with max assist, then maintain standing with max to mod assist.  Patient unable to stand erect when task was oriented at tabletop - stood with hip and trunk flexion.        Neurological Re-education Exercises   Other Exercises 1 Worked on grasp and supination/pronation in right arm, to turn over large playing cards.  Max cueing and facilitation to not have left hand do all the work - patient with inattention to right side.                    OT Education - 12/30/19 1847    Education Details bias legs toward rolling side to help with rolling  Person(s) Educated Patient;Child(ren)    Methods Explanation;Demonstration    Comprehension Verbalized understanding            OT Short Term Goals - 12/30/19 1850      OT SHORT TERM GOAL #1   Title Patient will complete lower body bathing with max assist due 6/19      OT SHORT TERM GOAL #2   Title Patient will bend forward while seated to pull pants up thighs    Status Achieved      OT SHORT TERM GOAL #3   Title Patient will transition from sit to stand with mod assist in prep for tolieting on commode    Status Achieved      OT SHORT TERM GOAL #4   Title Patient/caregiver will don/doff custom RUE splint with min cueing    Status Deferred             OT Long Term Goals - 12/30/19 1850      OT LONG TERM GOAL #1   Title Patient will transiton from sit to stand with mod assist to allow toileting, clothing management, and hygiene    Status Achieved      OT LONG TERM GOAL  #2   Title Patient/caregiver will complete a home exercise program to prevent pain, and improve passive motion in RUE to aide with hygiene and dressing tasks    Status Achieved      OT LONG TERM GOAL #3   Title Patient / caregiver will demonstrate effective edema management techniques for right hand, and support for right arm to prevent further subluxation    Status Achieved      OT LONG TERM GOAL #4   Title Patient will attend to right arm, and manage right arm during transitional movements and in wheelchair with min cueing    Status On-going      OT LONG TERM GOAL #5   Title Patient will stand with min assist for sufficient time that caregiver can complete toilet hygiene and clothing management due 9/24    Status New      OT LONG TERM GOAL #6   Title Patient will complete shower transfer into and out of tub with moderate assistance    Time 8    Period Weeks    Status New                 Plan - 12/30/19 1848    Clinical Impression Statement Patient's family expressing some concern reagrding recent increase in difficulty in rolling him toward left side.  Are monitoring for signs and symptoms of infection or neurological deficit.    OT Occupational Profile and History Detailed Assessment- Review of Records and additional review of physical, cognitive, psychosocial history related to current functional performance    Occupational performance deficits (Please refer to evaluation for details): ADL's;Rest and Sleep    Body Structure / Function / Physical Skills ADL;Coordination;Endurance;GMC;UE functional use;Balance;Decreased knowledge of precautions;Sensation;Pain;Flexibility;Body mechanics;Decreased knowledge of use of DME;FMC;Proprioception;Strength;Tone;ROM;Edema;Continence;Mobility    Cognitive Skills Attention;Emotional;Energy/Drive;Memory;Orientation;Perception;Problem Solve;Safety Awareness;Sequencing    Rehab Potential Fair    Clinical Decision Making Several treatment  options, min-mod task modification necessary    Comorbidities Affecting Occupational Performance: May have comorbidities impacting occupational performance    Modification or Assistance to Complete Evaluation  Min-Moderate modification of tasks or assist with assess necessary to complete eval    OT Frequency 2x / week    OT Duration 6 weeks    OT Treatment/Interventions Self-care/ADL training;Electrical Stimulation;Therapeutic  exercise;Visual/perceptual remediation/compensation;Patient/family education;Splinting;Neuromuscular education;Therapeutic activities;Balance training;Cognitive remediation/compensation;Passive range of motion;Manual Therapy;DME and/or AE instruction    Plan Assess shower pictures, Postural control, sitting balance, sit to lift off, weight shift right, lower body ADL weight shifts, NMR RUE    OT Home Exercise Plan SLiding right hand on thigh, tilted stool    Consulted and Agree with Plan of Care Patient;Family member/caregiver    Family Member Consulted dtr [name]           Patient will benefit from skilled therapeutic intervention in order to improve the following deficits and impairments:   Body Structure / Function / Physical Skills: ADL, Coordination, Endurance, GMC, UE functional use, Balance, Decreased knowledge of precautions, Sensation, Pain, Flexibility, Body mechanics, Decreased knowledge of use of DME, FMC, Proprioception, Strength, Tone, ROM, Edema, Continence, Mobility Cognitive Skills: Attention, Emotional, Energy/Drive, Memory, Orientation, Perception, Problem Solve, Safety Awareness, Sequencing     Visit Diagnosis: Hemiplegia and hemiparesis following cerebral infarction affecting right dominant side (HCC)  Unsteadiness on feet  Muscle weakness (generalized)  Attention and concentration deficit  Neurologic neglect syndrome  Stiffness of right shoulder, not elsewhere classified  Localized edema    Problem List Patient Active Problem List    Diagnosis Date Noted  . Chronic respiratory failure with hypoxia ([place]) 11/07/2019  . Pressure injury of skin 11/05/2019  . Acute renal failure superimposed on stage 4 chronic kidney disease ([place]) 11/04/2019  . Right hemiparesis ([place]) 11/04/2019  . Atrial fibrillation, chronic ([place]) 11/04/2019  . Severe sepsis ([place]) 11/04/2019  . CAP (community acquired pneumonia) 11/04/2019  . Dysphagia 11/04/2019  . Sepsis secondary to UTI ([place]) 11/03/2019  . 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 CKD (chronic kidney disease) stage 4, GFR 15-29 ml/min (HCC) 09/29/2019  . Thrombocytopenia ([place]) 09/29/2019  . Personal history of DVT (deep vein thrombosis) 09/09/2019  . History of ischemic stroke 09/09/2019  . CAD (coronary artery disease) 09/09/2019  . Chronic constipation 09/09/2019  . History of colorectal cancer 09/09/2019  . 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 Right arm weakness 09/09/2019  . Right leg weakness 09/09/2019  . 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 [place], [place], [address] Phone: [phone]   Fax:  [phone]  Name: Avrohom Mckelvin MRN: [id number] Date of Birth: 13-Jan-1922

## 2019-12-30 NOTE — Therapy (Signed)
[facility] [address] [place], [place], [address] Phone: [phone]   Fax:  [phone]  Physical Therapy Treatment  Patient Details  Name: Robert Mcconnell MRN: [id number] Date of Birth: [birth year] Referring Provider (PT): Arnell Sieving   Encounter Date: 12/30/2019   PT End of Session - 12/30/19 2132    Visit Number 27    Number of Visits 32    Date for PT Re-Evaluation 12/25/19    Authorization Type [facility]    Authorization Time Period 15 visits from 7/8-11/5    Authorization - Visit Number 11    Authorization - Number of Visits 15    Progress Note Due on Visit 15    PT Start Time 1700    PT Stop Time 1745    PT Time Calculation (min) 45 min    Equipment Utilized During Treatment Gait belt    Activity Tolerance Patient tolerated treatment well    Behavior During Therapy [facility] for tasks assessed/performed           Past Medical History:  Diagnosis Date  . Atrial fibrillation ([place])   . BPH (benign prostatic hyperplasia)   . CKD (chronic kidney disease)   . History of coronary angioplasty with insertion of stent   . Hypertension   . Severe sepsis ([place]) 11/04/2019  . Stroke [facility])     Past Surgical History:  Procedure Laterality Date  . COLECTOMY  2006   cancer    There were no vitals filed for this visit.   Subjective Assessment - 12/30/19 2123    Subjective Daughter reports that he has been having hard time standing at home. Even with exercises he is not moving his R leg as well as previously.    Patient is accompained by: Family member    Pertinent History CVA    Limitations Standing;Walking;House hold activities;Sitting    How long can you sit comfortably? 1-2 hours    How long can you stand comfortably? <5 min    How long can you walk comfortably? unable    Patient Stated Goals walk                 Treatment In parallel bars: Sit to stand: with knees blocked by PT, cues to lean  forward and shift weight towards L side with standing up, pt uses L parallel bar rail to stand up. Pt required max A to stand up and control descent: 10x Stnading balance while throwing bean bags: 3x, max A to maintain balance Power chair to mat table with slide board: max A required Sit to SL to supine: max A  SL clamshells: trace contraction noted: 20x SL AA and slow reversal of hip flexion and hip extension: 20x Pt was left in supine for OT session at end of the session.                      PT Short Term Goals - 12/01/19 1839      PT SHORT TERM GOAL #1   Title Patient will be able to perform sit to stand with mod A with or without AD to improve independence and reduce care giver burden    Baseline max A (eval); mod to max A (10/30/19), mox to max A (12/01/19)    Time 4    Period Weeks    Status On-going    Target Date 11/27/19      PT SHORT TERM GOAL #2  Title pt will be able to stand with mod A for 5 min to perform small reaching with L UE to improve standing balance.    Baseline able to stand with holding on to parallel bars with L UE but needs stabilization at knees to improve standing tolerance (max A required overall) is able to reach with max A, requires mod to max A but unable to hold for 5 min while performing reaching    Time 4    Period Weeks    Status On-going      PT SHORT TERM GOAL #3   Title Pt will be able to propel chair with SBA  for 30 feet to improve household navigation    Baseline Able to propel chair with use of L UE and L LE with mod A for 30 feet (10/30/19); patient has power chair now (12/01/19)    Time 4    Period Weeks    Status Not Met    Target Date 10/06/19             PT Long Term Goals - 10/30/19 1812      PT LONG TERM GOAL #1   Title Patient will be able to stand for 5 min with CGA to improve standing endurance with or without AD    Baseline requires max A    Time 8    Period Weeks    Status On-going    Target Date  12/25/19      PT LONG TERM GOAL #2   Title Pt will require CGA with sit to stand and chair to chair transfers to improve indepdnence and reduce caregiver burden.    Baseline max to total A    Time 8    Period Weeks    Status On-going    Target Date 12/25/19      PT LONG TERM GOAL #3   Title Patient will be able to walk into walk-in shower and sit on shower chair with CGA to improve independence.    Baseline bed sponge bath only    Time 8    Period Weeks    Status On-going    Target Date 12/25/19      PT LONG TERM GOAL #4   Title Pt will demo >10/56 on BBS to improve static balance and reduce fall risk    Baseline BBS 4/56 (eval)    Time 8    Period Weeks    Status On-going    Target Date 12/25/19                 Plan - 12/30/19 2130    Clinical Impression Statement Pt needed max A with sit to stand transfers. patient was able to stand for 2 min 42 seconds maximum with static standing. We worked on more dyanmic standing balance where patient threw bean bags. Patient needed mod to max A to maintain standing balance.    Personal Factors and Comorbidities Age;Comorbidity 3+    Comorbidities hx of stroke, hx of colorectal cancer    Examination-Activity Limitations Bathing;Bed Mobility;Caring for Others;Carry;Locomotion Level;Hygiene/Grooming;Dressing;Continence;Self Feeding;Reach Overhead;Sit;Lift;Other;Transfers;Toileting;Stand;Stairs    Clinical Decision Making High    Rehab Potential Good    PT Frequency 2x / week    PT Duration 8 weeks    PT Treatment/Interventions ADLs/Self Care Home Management;Electrical Stimulation;Gait training;Stair training;Functional mobility training;Therapeutic exercise;Balance training;Neuromuscular re-education;Therapeutic activities;Patient/family education;Wheelchair mobility training;Manual techniques;Passive range of motion    PT Next Visit Plan continue to work on sit to stand transfers,  chair to chair transfers, wheelchair mobility            Patient will benefit from skilled therapeutic intervention in order to improve the following deficits and impairments:  Abnormal gait, Decreased activity tolerance, Decreased coordination, Decreased safety awareness, Decreased strength, Impaired flexibility, Impaired UE functional use, Postural dysfunction, Impaired tone, Increased edema, Decreased range of motion, Decreased knowledge of precautions, Impaired sensation, Difficulty walking, Decreased mobility, Decreased endurance, Decreased balance  Visit Diagnosis: Hemiplegia and hemiparesis following cerebral infarction affecting right dominant side (HCC)  Unsteadiness on feet  Muscle weakness (generalized)  Neurologic neglect syndrome     Problem List Patient Active Problem List   Diagnosis Date Noted  . Chronic respiratory failure with hypoxia ([place]) 11/07/2019  . Pressure injury of skin 11/05/2019  . Acute renal failure superimposed on stage 4 chronic kidney disease ([place]) 11/04/2019  . Right hemiparesis ([place]) 11/04/2019  . Atrial fibrillation, chronic ([place]) 11/04/2019  . Severe sepsis ([place]) 11/04/2019  . CAP (community acquired pneumonia) 11/04/2019  . Dysphagia 11/04/2019  . Sepsis secondary to UTI ([place]) 11/03/2019  . 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 CKD (chronic kidney disease) stage 4, GFR 15-29 ml/min (HCC) 09/29/2019  . Thrombocytopenia ([place]) 09/29/2019  . Personal history of DVT (deep vein thrombosis) 09/09/2019  . History of ischemic stroke 09/09/2019  . CAD (coronary artery disease) 09/09/2019  . Chronic constipation 09/09/2019  . History of colorectal cancer 09/09/2019  . 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 Right arm weakness 09/09/2019  . Right leg weakness 09/09/2019  . 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 [place], [place],  [address] Phone: [phone]   Fax:  [phone]  Name: Robert Mcconnell MRN: [id number] Date of Birth: 06/25/1921

## 2020-01-01 ENCOUNTER — Other Ambulatory Visit: Payer: Self-pay

## 2020-01-01 ENCOUNTER — Ambulatory Visit: Payer: No Typology Code available for payment source

## 2020-01-01 ENCOUNTER — Ambulatory Visit: Payer: No Typology Code available for payment source | Admitting: Occupational Therapy

## 2020-01-01 ENCOUNTER — Encounter: Payer: Self-pay | Admitting: Occupational Therapy

## 2020-01-01 ENCOUNTER — Encounter: Payer: Medicare Other | Admitting: Speech Pathology

## 2020-01-01 DIAGNOSIS — R414 Neurologic neglect syndrome: Secondary | ICD-10-CM

## 2020-01-01 DIAGNOSIS — Z8673 Personal history of transient ischemic attack (TIA), and cerebral infarction without residual deficits: Secondary | ICD-10-CM | POA: Diagnosis not present

## 2020-01-01 DIAGNOSIS — I69351 Hemiplegia and hemiparesis following cerebral infarction affecting right dominant side: Secondary | ICD-10-CM

## 2020-01-01 DIAGNOSIS — M6281 Muscle weakness (generalized): Secondary | ICD-10-CM

## 2020-01-01 DIAGNOSIS — R2681 Unsteadiness on feet: Secondary | ICD-10-CM

## 2020-01-01 DIAGNOSIS — R41840 Attention and concentration deficit: Secondary | ICD-10-CM

## 2020-01-01 DIAGNOSIS — M25611 Stiffness of right shoulder, not elsewhere classified: Secondary | ICD-10-CM

## 2020-01-01 DIAGNOSIS — R600 Localized edema: Secondary | ICD-10-CM

## 2020-01-01 NOTE — Therapy (Signed)
[facility] [address] [place], [place], [address] Phone: [phone]   Fax:  [phone]  Occupational Therapy Treatment  Patient Details  Name: Robert Mcconnell MRN: [id number] Date of Birth: [birth year] Referring Provider (OT): Eliezer Lofts   Encounter Date: 01/01/2020   OT End of Session - 01/01/20 1848    Visit Number 18    Number of Visits 26    Date for OT Re-Evaluation 01/30/20    Authorization Type VA    Authorization - Visit Number 18    Authorization - Number of Visits 30    Progress Note Due on Visit 20    OT Start Time 1745    OT Stop Time 1830    OT Time Calculation (min) 45 min    Activity Tolerance Patient tolerated treatment well    Behavior During Therapy [facility] for tasks assessed/performed           Past Medical History:  Diagnosis Date  . Atrial fibrillation ([place])   . BPH (benign prostatic hyperplasia)   . CKD (chronic kidney disease)   . History of coronary angioplasty with insertion of stent   . Hypertension   . Severe sepsis ([place]) 11/04/2019  . Stroke [facility])     Past Surgical History:  Procedure Laterality Date  . COLECTOMY  2006   cancer    There were no vitals filed for this visit.   Subjective Assessment - 01/01/20 1846    Subjective  I am getting better    Patient is accompanied by: Family member    Currently in Pain? No/denies    Pain Score 0-No pain                        OT Treatments/Exercises (OP) - 01/01/20 0001      Neurological Re-education Exercises   Other Exercises 1 Working on components of sit to stand, stand to sit, stand balance, stand tolerance and decreased UE reliance in standing to later aide with ADL.  Patient able to pull self to stand on multiple occassions today with only set up assistance.  Patient stood for several minutes while building tower.  Patient shifting weight left/right, and adjusting left foot.  Improved weight through RLE  this session.                      OT Short Term Goals - 12/30/19 1850      OT SHORT TERM GOAL #1   Title Patient will complete lower body bathing with max assist due 6/19      OT SHORT TERM GOAL #2   Title Patient will bend forward while seated to pull pants up thighs    Status Achieved      OT SHORT TERM GOAL #3   Title Patient will transition from sit to stand with mod assist in prep for tolieting on commode    Status Achieved      OT SHORT TERM GOAL #4   Title Patient/caregiver will don/doff custom RUE splint with min cueing    Status Deferred             OT Long Term Goals - 12/30/19 1850      OT LONG TERM GOAL #1   Title Patient will transiton from sit to stand with mod assist to allow toileting, clothing management, and hygiene    Status Achieved      OT LONG TERM GOAL #2  Title Patient/caregiver will complete a home exercise program to prevent pain, and improve passive motion in RUE to aide with hygiene and dressing tasks    Status Achieved      OT LONG TERM GOAL #3   Title Patient / caregiver will demonstrate effective edema management techniques for right hand, and support for right arm to prevent further subluxation    Status Achieved      OT LONG TERM GOAL #4   Title Patient will attend to right arm, and manage right arm during transitional movements and in wheelchair with min cueing    Status On-going      OT LONG TERM GOAL #5   Title Patient will stand with min assist for sufficient time that caregiver can complete toilet hygiene and clothing management due 9/24    Status New      OT LONG TERM GOAL #6   Title Patient will complete shower transfer into and out of tub with moderate assistance    Time 8    Period Weeks    Status New                 Plan - 01/01/20 1849    Clinical Impression Statement Patient with improved ability to participate today.  Improved standing balance noted - working toward goal of increased safety with  toileting.    OT Occupational Profile and History Detailed Assessment- Review of Records and additional review of physical, cognitive, psychosocial history related to current functional performance    Occupational performance deficits (Please refer to evaluation for details): ADL's;Rest and Sleep    Body Structure / Function / Physical Skills ADL;Coordination;Endurance;GMC;UE functional use;Balance;Decreased knowledge of precautions;Sensation;Pain;Flexibility;Body mechanics;Decreased knowledge of use of DME;FMC;Proprioception;Strength;Tone;ROM;Edema;Continence;Mobility    Cognitive Skills Attention;Emotional;Energy/Drive;Memory;Orientation;Perception;Problem Solve;Safety Awareness;Sequencing    Rehab Potential Fair    Clinical Decision Making Several treatment options, min-mod task modification necessary    Comorbidities Affecting Occupational Performance: May have comorbidities impacting occupational performance    Modification or Assistance to Complete Evaluation  Min-Moderate modification of tasks or assist with assess necessary to complete eval    OT Frequency 2x / week    OT Duration 6 weeks    OT Treatment/Interventions Self-care/ADL training;Electrical Stimulation;Therapeutic exercise;Visual/perceptual remediation/compensation;Patient/family education;Splinting;Neuromuscular education;Therapeutic activities;Balance training;Cognitive remediation/compensation;Passive range of motion;Manual Therapy;DME and/or AE instruction    Plan Postural control, sitting balance, sit to lift off, weight shift right, lower body ADL weight shifts, NMR RUE    OT Home Exercise Plan SLiding right hand on thigh, tilted stool    Consulted and Agree with Plan of Care Patient;Family member/caregiver    Family Member Consulted son in law- Virgia Land           Patient will benefit from skilled therapeutic intervention in order to improve the following deficits and impairments:   Body Structure / Function / Physical  Skills: ADL, Coordination, Endurance, GMC, UE functional use, Balance, Decreased knowledge of precautions, Sensation, Pain, Flexibility, Body mechanics, Decreased knowledge of use of DME, FMC, Proprioception, Strength, Tone, ROM, Edema, Continence, Mobility Cognitive Skills: Attention, Emotional, Energy/Drive, Memory, Orientation, Perception, Problem Solve, Safety Awareness, Sequencing     Visit Diagnosis: Hemiplegia and hemiparesis following cerebral infarction affecting right dominant side (HCC)  Unsteadiness on feet  Muscle weakness (generalized)  Neurologic neglect syndrome  Attention and concentration deficit  Stiffness of right shoulder, not elsewhere classified  Localized edema    Problem List Patient Active Problem List   Diagnosis Date Noted  . Chronic respiratory failure with hypoxia ([place]) 11/07/2019  .  Pressure injury of skin 11/05/2019  . Acute renal failure superimposed on stage 4 chronic kidney disease ([place]) 11/04/2019  . Right hemiparesis ([place]) 11/04/2019  . Atrial fibrillation, chronic ([place]) 11/04/2019  . Severe sepsis ([place]) 11/04/2019  . CAP (community acquired pneumonia) 11/04/2019  . Dysphagia 11/04/2019  . Sepsis secondary to UTI ([place]) 11/03/2019  . 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 CKD (chronic kidney disease) stage 4, GFR 15-29 ml/min (HCC) 09/29/2019  . Thrombocytopenia ([place]) 09/29/2019  . Personal history of DVT (deep vein thrombosis) 09/09/2019  . History of ischemic stroke 09/09/2019  . CAD (coronary artery disease) 09/09/2019  . Chronic constipation 09/09/2019  . History of colorectal cancer 09/09/2019  . 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 Right arm weakness 09/09/2019  . Right leg weakness 09/09/2019  . 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 [place],  [place], [address] Phone: [phone]   Fax:  [phone]  Name: Jaydan Meidinger MRN: [id number] Date of Birth: 09-12-1921

## 2020-01-01 NOTE — Therapy (Signed)
[facility] [address] [place], [place], [address] Phone: [phone]   Fax:  [phone]  Physical Therapy Treatment  Patient Details  Name: Robert Mcconnell MRN: [id number] Date of Birth: November 14, 1921 Referring Provider (PT): Arnell Sieving   Encounter Date: 01/01/2020   PT End of Session - 01/01/20 1837    Visit Number 28    Number of Visits 32    Date for PT Re-Evaluation 12/25/19    Authorization Type VA    Authorization Time Period 15 visits from 7/8-11/5    Authorization - Visit Number 12    Authorization - Number of Visits 15    Progress Note Due on Visit 15    PT Start Time 1700    PT Stop Time 1745    PT Time Calculation (min) 45 min    Equipment Utilized During Treatment Gait belt    Activity Tolerance Patient tolerated treatment well    Behavior During Therapy [facility] for tasks assessed/performed           Past Medical History:  Diagnosis Date  . Atrial fibrillation ([place])   . BPH (benign prostatic hyperplasia)   . CKD (chronic kidney disease)   . History of coronary angioplasty with insertion of stent   . Hypertension   . Severe sepsis ([place]) 11/04/2019  . Stroke [facility])     Past Surgical History:  Procedure Laterality Date  . COLECTOMY  2006   cancer    There were no vitals filed for this visit.   Subjective Assessment - 01/01/20 1829    Subjective Daughter reports that he did little better with standing yesterday.    Patient is accompained by: Family member    Pertinent History CVA    Limitations Standing;Walking;House hold activities;Sitting    How long can you sit comfortably? 1-2 hours    How long can you stand comfortably? <5 min    How long can you walk comfortably? unable    Patient Stated Goals walk                   Body weight supported walking with biodex; setting 150-180 lbs. With walking about 40-60lbs was being unweighted by machine. 1 x 115' with max A to  shift weight to L side with L stance phase and also to advance R LE forward. Boot slider placed on R foot.                      PT Short Term Goals - 12/01/19 1839      PT SHORT TERM GOAL #1   Title Patient will be able to perform sit to stand with mod A with or without AD to improve independence and reduce care giver burden    Baseline max A (eval); mod to max A (10/30/19), mox to max A (12/01/19)    Time 4    Period Weeks    Status On-going    Target Date 11/27/19      PT SHORT TERM GOAL #2   Title pt will be able to stand with mod A for 5 min to perform small reaching with L UE to improve standing balance.    Baseline able to stand with holding on to parallel bars with L UE but needs stabilization at knees to improve standing tolerance (max A required overall) is able to reach with max A, requires mod to max A but unable to hold for 5 min while performing  reaching    Time 4    Period Weeks    Status On-going      PT SHORT TERM GOAL #3   Title Pt will be able to propel chair with SBA  for 30 feet to improve household navigation    Baseline Able to propel chair with use of L UE and L LE with mod A for 30 feet (10/30/19); patient has power chair now (12/01/19)    Time 4    Period Weeks    Status Not Met    Target Date 10/06/19             PT Long Term Goals - 10/30/19 1812      PT LONG TERM GOAL #1   Title Patient will be able to stand for 5 min with CGA to improve standing endurance with or without AD    Baseline requires max A    Time 8    Period Weeks    Status On-going    Target Date 12/25/19      PT LONG TERM GOAL #2   Title Pt will require CGA with sit to stand and chair to chair transfers to improve indepdnence and reduce caregiver burden.    Baseline max to total A    Time 8    Period Weeks    Status On-going    Target Date 12/25/19      PT LONG TERM GOAL #3   Title Patient will be able to walk into walk-in shower and sit on shower chair with  CGA to improve independence.    Baseline bed sponge bath only    Time 8    Period Weeks    Status On-going    Target Date 12/25/19      PT LONG TERM GOAL #4   Title Pt will demo >10/56 on BBS to improve static balance and reduce fall risk    Baseline BBS 4/56 (eval)    Time 8    Period Weeks    Status On-going    Target Date 12/25/19                 Plan - 01/01/20 1836    Clinical Impression Statement Pt did well with body weight supported walking today. When patient's weight was shifted by therapist to his left side, patient was able to advance his R leg by flexing R hip but not past L foot. Patient needs max A to shift weght to his left side during left stance phase to advance R LE.    Personal Factors and Comorbidities Age;Comorbidity 3+    Comorbidities hx of stroke, hx of colorectal cancer    Examination-Activity Limitations Bathing;Bed Mobility;Caring for Others;Carry;Locomotion Level;Hygiene/Grooming;Dressing;Continence;Self Feeding;Reach Overhead;Sit;Lift;Other;Transfers;Toileting;Stand;Stairs    Rehab Potential Good    PT Frequency 2x / week    PT Duration 8 weeks    PT Treatment/Interventions ADLs/Self Care Home Management;Electrical Stimulation;Gait training;Stair training;Functional mobility training;Therapeutic exercise;Balance training;Neuromuscular re-education;Therapeutic activities;Patient/family education;Wheelchair mobility training;Manual techniques;Passive range of motion    PT Next Visit Plan continue to work on sit to stand transfers, chair to chair transfers, wheelchair mobility           Patient will benefit from skilled therapeutic intervention in order to improve the following deficits and impairments:  Abnormal gait, Decreased activity tolerance, Decreased coordination, Decreased safety awareness, Decreased strength, Impaired flexibility, Impaired UE functional use, Postural dysfunction, Impaired tone, Increased edema, Decreased range of motion,  Decreased knowledge of precautions, Impaired sensation, Difficulty walking,  Decreased mobility, Decreased endurance, Decreased balance  Visit Diagnosis: Hemiplegia and hemiparesis following cerebral infarction affecting right dominant side (HCC)  Unsteadiness on feet  Muscle weakness (generalized)  Neurologic neglect syndrome     Problem List Patient Active Problem List   Diagnosis Date Noted  . Chronic respiratory failure with hypoxia ([place]) 11/07/2019  . Pressure injury of skin 11/05/2019  . Acute renal failure superimposed on stage 4 chronic kidney disease ([place]) 11/04/2019  . Right hemiparesis ([place]) 11/04/2019  . Atrial fibrillation, chronic ([place]) 11/04/2019  . Severe sepsis ([place]) 11/04/2019  . CAP (community acquired pneumonia) 11/04/2019  . Dysphagia 11/04/2019  . Sepsis secondary to UTI ([place]) 11/03/2019  . 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 CKD (chronic kidney disease) stage 4, GFR 15-29 ml/min (HCC) 09/29/2019  . Thrombocytopenia ([place]) 09/29/2019  . Personal history of DVT (deep vein thrombosis) 09/09/2019  . History of ischemic stroke 09/09/2019  . CAD (coronary artery disease) 09/09/2019  . Chronic constipation 09/09/2019  . History of colorectal cancer 09/09/2019  . 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 Right arm weakness 09/09/2019  . Right leg weakness 09/09/2019  . 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 [place], [place], [address] Phone: [phone]   Fax:  [phone]  Name: Maksym Pfiffner MRN: [id number] Date of Birth: 1922/01/29

## 2020-01-06 ENCOUNTER — Encounter: Payer: Medicare Other | Admitting: Speech Pathology

## 2020-01-06 ENCOUNTER — Encounter: Payer: Self-pay | Admitting: Occupational Therapy

## 2020-01-06 ENCOUNTER — Ambulatory Visit: Payer: No Typology Code available for payment source

## 2020-01-06 ENCOUNTER — Ambulatory Visit: Payer: No Typology Code available for payment source | Admitting: Occupational Therapy

## 2020-01-06 ENCOUNTER — Other Ambulatory Visit: Payer: Self-pay

## 2020-01-06 DIAGNOSIS — R2681 Unsteadiness on feet: Secondary | ICD-10-CM

## 2020-01-06 DIAGNOSIS — R600 Localized edema: Secondary | ICD-10-CM

## 2020-01-06 DIAGNOSIS — I69351 Hemiplegia and hemiparesis following cerebral infarction affecting right dominant side: Secondary | ICD-10-CM

## 2020-01-06 DIAGNOSIS — M6281 Muscle weakness (generalized): Secondary | ICD-10-CM

## 2020-01-06 DIAGNOSIS — R414 Neurologic neglect syndrome: Secondary | ICD-10-CM

## 2020-01-06 DIAGNOSIS — Z8673 Personal history of transient ischemic attack (TIA), and cerebral infarction without residual deficits: Secondary | ICD-10-CM | POA: Diagnosis not present

## 2020-01-06 DIAGNOSIS — M25611 Stiffness of right shoulder, not elsewhere classified: Secondary | ICD-10-CM

## 2020-01-06 DIAGNOSIS — R41840 Attention and concentration deficit: Secondary | ICD-10-CM

## 2020-01-06 NOTE — Therapy (Signed)
[facility] [address] [place], [place], [address] Phone: [phone]   Fax:  [phone]  Physical Therapy Treatment  Patient Details  Name: Robert Mcconnell MRN: [id number] Date of Birth: Sep 13, 1921 Referring Provider (PT): Arnell Sieving   Encounter Date: 01/06/2020   PT End of Session - 01/06/20 1759    Visit Number 29    Number of Visits 32    Date for PT Re-Evaluation 12/25/19    Authorization Type [facility]    Authorization Time Period 15 visits from 7/8-11/5    Authorization - Visit Number 13    Authorization - Number of Visits 15    Progress Note Due on Visit 15    PT Start Time 1700    PT Stop Time 1745    PT Time Calculation (min) 45 min    Equipment Utilized During Treatment Gait belt    Activity Tolerance Patient tolerated treatment well    Behavior During Therapy [facility] for tasks assessed/performed           Past Medical History:  Diagnosis Date  . Atrial fibrillation ([place])   . BPH (benign prostatic hyperplasia)   . CKD (chronic kidney disease)   . History of coronary angioplasty with insertion of stent   . Hypertension   . Severe sepsis ([place]) 11/04/2019  . Stroke [facility])     Past Surgical History:  Procedure Laterality Date  . COLECTOMY  2006   cancer    There were no vitals filed for this visit.   Subjective Assessment - 01/06/20 1757    Subjective Daughter reports that he has UTI again and he is on antibiotics.    Patient is accompained by: Family member    Pertinent History CVA    Limitations Standing;Walking;House hold activities;Sitting    How long can you sit comfortably? 1-2 hours    How long can you stand comfortably? <5 min    How long can you walk comfortably? unable    Patient Stated Goals walk                Gait training: wit biodex harness with correct patient weight entered and pt was 60-80lbs unweighted:, 2 x 115' Max A x 2 (one person shifting weight and moving  LE during swing phase), blocking bil knees, tactile cues to weight shift other person guiding the harness system  In body weight supported harness: Kicking the soccer ball: 20x L leg, 10x R leg, max A to support R knee when kicking with L leg Holding L leg on top of the soccer ball while patient puts all weight on R knee, max A to stabilize R knee: 5 x 10" holds  Pt was put back in power chair for his next OT session.                            PT Short Term Goals - 12/01/19 1839      PT SHORT TERM GOAL #1   Title Patient will be able to perform sit to stand with mod A with or without AD to improve independence and reduce care giver burden    Baseline max A (eval); mod to max A (10/30/19), mox to max A (12/01/19)    Time 4    Period Weeks    Status On-going    Target Date 11/27/19      PT SHORT TERM GOAL #2   Title pt will  be able to stand with mod A for 5 min to perform small reaching with L UE to improve standing balance.    Baseline able to stand with holding on to parallel bars with L UE but needs stabilization at knees to improve standing tolerance (max A required overall) is able to reach with max A, requires mod to max A but unable to hold for 5 min while performing reaching    Time 4    Period Weeks    Status On-going      PT SHORT TERM GOAL #3   Title Pt will be able to propel chair with SBA  for 30 feet to improve household navigation    Baseline Able to propel chair with use of L UE and L LE with mod A for 30 feet (10/30/19); patient has power chair now (12/01/19)    Time 4    Period Weeks    Status Not Met    Target Date 10/06/19             PT Long Term Goals - 10/30/19 1812      PT LONG TERM GOAL #1   Title Patient will be able to stand for 5 min with CGA to improve standing endurance with or without AD    Baseline requires max A    Time 8    Period Weeks    Status On-going    Target Date 12/25/19      PT LONG TERM GOAL #2   Title Pt  will require CGA with sit to stand and chair to chair transfers to improve indepdnence and reduce caregiver burden.    Baseline max to total A    Time 8    Period Weeks    Status On-going    Target Date 12/25/19      PT LONG TERM GOAL #3   Title Patient will be able to walk into walk-in shower and sit on shower chair with CGA to improve independence.    Baseline bed sponge bath only    Time 8    Period Weeks    Status On-going    Target Date 12/25/19      PT LONG TERM GOAL #4   Title Pt will demo >10/56 on BBS to improve static balance and reduce fall risk    Baseline BBS 4/56 (eval)    Time 8    Period Weeks    Status On-going    Target Date 12/25/19                 Plan - 01/06/20 1758    Clinical Impression Statement Today's skilled session was focused on continued gait training with body weight supported harness. Pt requires max a and he was leaning into the harness more with walking and requires max cueing to shift weight, sequencing of stepping etc.  We progressed his standing in body weight supported harness with max A to stabilize R knee while he was kicking with left leg to prevent the leg from buckling.    Personal Factors and Comorbidities Age;Comorbidity 3+    Comorbidities hx of stroke, hx of colorectal cancer    Examination-Activity Limitations Bathing;Bed Mobility;Caring for Others;Carry;Locomotion Level;Hygiene/Grooming;Dressing;Continence;Self Feeding;Reach Overhead;Sit;Lift;Other;Transfers;Toileting;Stand;Stairs    Rehab Potential Good    PT Frequency 2x / week    PT Duration 8 weeks    PT Treatment/Interventions ADLs/Self Care Home Management;Electrical Stimulation;Gait training;Stair training;Functional mobility training;Therapeutic exercise;Balance training;Neuromuscular re-education;Therapeutic activities;Patient/family education;Wheelchair mobility training;Manual techniques;Passive range of motion  PT Next Visit Plan continue to work on sit to stand  transfers, chair to chair transfers, wheelchair mobility           Patient will benefit from skilled therapeutic intervention in order to improve the following deficits and impairments:  Abnormal gait, Decreased activity tolerance, Decreased coordination, Decreased safety awareness, Decreased strength, Impaired flexibility, Impaired UE functional use, Postural dysfunction, Impaired tone, Increased edema, Decreased range of motion, Decreased knowledge of precautions, Impaired sensation, Difficulty walking, Decreased mobility, Decreased endurance, Decreased balance  Visit Diagnosis: Hemiplegia and hemiparesis following cerebral infarction affecting right dominant side (HCC)  Unsteadiness on feet  Muscle weakness (generalized)  Neurologic neglect syndrome     Problem List Patient Active Problem List   Diagnosis Date Noted  . Chronic respiratory failure with hypoxia ([place]) 11/07/2019  . Pressure injury of skin 11/05/2019  . Acute renal failure superimposed on stage 4 chronic kidney disease ([place]) 11/04/2019  . Right hemiparesis ([place]) 11/04/2019  . Atrial fibrillation, chronic ([place]) 11/04/2019  . Severe sepsis ([place]) 11/04/2019  . CAP (community acquired pneumonia) 11/04/2019  . Dysphagia 11/04/2019  . Sepsis secondary to UTI ([place]) 11/03/2019  . 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 CKD (chronic kidney disease) stage 4, GFR 15-29 ml/min (HCC) 09/29/2019  . Thrombocytopenia ([place]) 09/29/2019  . Personal history of DVT (deep vein thrombosis) 09/09/2019  . History of ischemic stroke 09/09/2019  . CAD (coronary artery disease) 09/09/2019  . Chronic constipation 09/09/2019  . History of colorectal cancer 09/09/2019  . 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 Right arm weakness 09/09/2019  . Right leg weakness 09/09/2019  . 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

## 2020-01-06 NOTE — Therapy (Signed)
[facility] [address] [place], [place], [address] Phone: [phone]   Fax:  [phone]  Occupational Therapy Treatment  Patient Details  Name: Robert Mcconnell MRN: [id number] Date of Birth: Feb 07, 1922 Referring Provider (OT): Eliezer Lofts   Encounter Date: 01/06/2020   OT End of Session - 01/06/20 1842    Visit Number 19    Number of Visits 26    Date for OT Re-Evaluation 01/30/20    Authorization Type VA    Authorization - Visit Number 93    Authorization - Number of Visits 30    Progress Note Due on Visit 20    OT Start Time 1747    OT Stop Time 1830    OT Time Calculation (min) 43 min    Activity Tolerance Patient tolerated treatment well    Behavior During Therapy [facility] for tasks assessed/performed           Past Medical History:  Diagnosis Date  . Atrial fibrillation ([place])   . BPH (benign prostatic hyperplasia)   . CKD (chronic kidney disease)   . History of coronary angioplasty with insertion of stent   . Hypertension   . Severe sepsis ([place]) 11/04/2019  . Stroke [facility])     Past Surgical History:  Procedure Laterality Date  . COLECTOMY  2006   cancer    There were no vitals filed for this visit.   Subjective Assessment - 01/06/20 1839    Subjective  Patient's daughter indicates he has a urinary tract infection    Patient is accompanied by: Family member    Currently in Pain? No/denies    Pain Score 0-No pain                        OT Treatments/Exercises (OP) - 01/06/20 0001      Neurological Re-education Exercises   Other Exercises 1 Neuromuscular reeducation to address postural control in standing.  Patient with severe perceptual deficits, although becoming more competent/comfortable with static standing, and even dynamic standing.  Patient today able to stand and place shapes into shapeboard.  Able to stand and begin to shift weight to cueing then step forward with left  leg if right leg aligned and supported.  Patient with inconsistent performance based on perceptual deficits - this is to be expected, but does well with rote repetition and minor changes to procedures.  Patient able to transition from sit to stand pulling up from bar with left hand and min assist to supervision assistance, fairly consistently.                      OT Short Term Goals - 12/30/19 1850      OT SHORT TERM GOAL #1   Title Patient will complete lower body bathing with max assist due 6/19      OT SHORT TERM GOAL #2   Title Patient will bend forward while seated to pull pants up thighs    Status Achieved      OT SHORT TERM GOAL #3   Title Patient will transition from sit to stand with mod assist in prep for tolieting on commode    Status Achieved      OT SHORT TERM GOAL #4   Title Patient/caregiver will don/doff custom RUE splint with min cueing    Status Deferred             OT Long Term Goals - 12/30/19 1850  OT LONG TERM GOAL #1   Title Patient will transiton from sit to stand with mod assist to allow toileting, clothing management, and hygiene    Status Achieved      OT LONG TERM GOAL #2   Title Patient/caregiver will complete a home exercise program to prevent pain, and improve passive motion in RUE to aide with hygiene and dressing tasks    Status Achieved      OT LONG TERM GOAL #3   Title Patient / caregiver will demonstrate effective edema management techniques for right hand, and support for right arm to prevent further subluxation    Status Achieved      OT LONG TERM GOAL #4   Title Patient will attend to right arm, and manage right arm during transitional movements and in wheelchair with min cueing    Status On-going      OT LONG TERM GOAL #5   Title Patient will stand with min assist for sufficient time that caregiver can complete toilet hygiene and clothing management due 9/24    Status New      OT LONG TERM GOAL #6   Title Patient  will complete shower transfer into and out of tub with moderate assistance    Time 8    Period Weeks    Status New                 Plan - 01/06/20 1843    Clinical Impression Statement Patient progressing slowly, but steadily with postural control and transitional movements.    OT Occupational Profile and History Detailed Assessment- Review of Records and additional review of physical, cognitive, psychosocial history related to current functional performance    Occupational performance deficits (Please refer to evaluation for details): ADL's;Rest and Sleep    Body Structure / Function / Physical Skills ADL;Coordination;Endurance;GMC;UE functional use;Balance;Decreased knowledge of precautions;Sensation;Pain;Flexibility;Body mechanics;Decreased knowledge of use of DME;FMC;Proprioception;Strength;Tone;ROM;Edema;Continence;Mobility    Cognitive Skills Attention;Emotional;Energy/Drive;Memory;Orientation;Perception;Problem Solve;Safety Awareness;Sequencing    Rehab Potential Fair    Clinical Decision Making Several treatment options, min-mod task modification necessary    Comorbidities Affecting Occupational Performance: May have comorbidities impacting occupational performance    Modification or Assistance to Complete Evaluation  Min-Moderate modification of tasks or assist with assess necessary to complete eval    OT Frequency 2x / week    OT Duration 6 weeks    OT Treatment/Interventions Self-care/ADL training;Electrical Stimulation;Therapeutic exercise;Visual/perceptual remediation/compensation;Patient/family education;Splinting;Neuromuscular education;Therapeutic activities;Balance training;Cognitive remediation/compensation;Passive range of motion;Manual Therapy;DME and/or AE instruction    Plan PN Due!  Postural control, sitting balance, sit to lift off, weight shift right, lower body ADL weight shifts, NMR RUE    OT Home Exercise Plan SLiding right hand on thigh, tilted stool     Consulted and Agree with Plan of Care Patient;Family member/caregiver    Family Member Consulted Daughter [name]           Patient will benefit from skilled therapeutic intervention in order to improve the following deficits and impairments:   Body Structure / Function / Physical Skills: ADL, Coordination, Endurance, GMC, UE functional use, Balance, Decreased knowledge of precautions, Sensation, Pain, Flexibility, Body mechanics, Decreased knowledge of use of DME, FMC, Proprioception, Strength, Tone, ROM, Edema, Continence, Mobility Cognitive Skills: Attention, Emotional, Energy/Drive, Memory, Orientation, Perception, Problem Solve, Safety Awareness, Sequencing     Visit Diagnosis: Hemiplegia and hemiparesis following cerebral infarction affecting right dominant side (HCC)  Unsteadiness on feet  Muscle weakness (generalized)  Neurologic neglect syndrome  Attention and concentration deficit  Stiffness of right shoulder, not elsewhere classified  Localized edema    Problem List Patient Active Problem List   Diagnosis Date Noted  . Chronic respiratory failure with hypoxia ([place]) 11/07/2019  . Pressure injury of skin 11/05/2019  . Acute renal failure superimposed on stage 4 chronic kidney disease ([place]) 11/04/2019  . Right hemiparesis ([place]) 11/04/2019  . Atrial fibrillation, chronic ([place]) 11/04/2019  . Severe sepsis ([place]) 11/04/2019  . CAP (community acquired pneumonia) 11/04/2019  . Dysphagia 11/04/2019  . Sepsis secondary to UTI ([place]) 11/03/2019  . 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 CKD (chronic kidney disease) stage 4, GFR 15-29 ml/min (HCC) 09/29/2019  . Thrombocytopenia ([place]) 09/29/2019  . Personal history of DVT (deep vein thrombosis) 09/09/2019  . History of ischemic stroke 09/09/2019  . CAD (coronary artery disease) 09/09/2019  . Chronic constipation 09/09/2019  . History of colorectal cancer 09/09/2019  . Facial paralysis on  right side 09/09/2019  . Right arm weakness 09/09/2019  . Right leg weakness 09/09/2019  . 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 [place], [place], [address] Phone: [phone]   Fax:  [phone]  Name: Doc Mandala MRN: [id number] Date of Birth: 12/28/1921

## 2020-01-08 ENCOUNTER — Other Ambulatory Visit: Payer: Self-pay

## 2020-01-08 ENCOUNTER — Ambulatory Visit: Payer: No Typology Code available for payment source | Attending: Family Medicine

## 2020-01-08 ENCOUNTER — Ambulatory Visit: Payer: No Typology Code available for payment source | Admitting: Occupational Therapy

## 2020-01-08 ENCOUNTER — Encounter: Payer: Self-pay | Admitting: Occupational Therapy

## 2020-01-08 DIAGNOSIS — M6281 Muscle weakness (generalized): Secondary | ICD-10-CM

## 2020-01-08 DIAGNOSIS — R600 Localized edema: Secondary | ICD-10-CM

## 2020-01-08 DIAGNOSIS — R2681 Unsteadiness on feet: Secondary | ICD-10-CM

## 2020-01-08 DIAGNOSIS — R414 Neurologic neglect syndrome: Secondary | ICD-10-CM | POA: Insufficient documentation

## 2020-01-08 DIAGNOSIS — M25611 Stiffness of right shoulder, not elsewhere classified: Secondary | ICD-10-CM | POA: Diagnosis present

## 2020-01-08 DIAGNOSIS — I69351 Hemiplegia and hemiparesis following cerebral infarction affecting right dominant side: Secondary | ICD-10-CM | POA: Diagnosis present

## 2020-01-08 DIAGNOSIS — R41840 Attention and concentration deficit: Secondary | ICD-10-CM

## 2020-01-08 NOTE — Therapy (Signed)
[facility] [address] [place], [place], [address] Phone: [phone]   Fax:  [phone]  Occupational Therapy Treatment and Progress Note  Patient Details  Name: Robert Mcconnell MRN: [id number] Date of Birth: 1921-11-13 Referring Provider (OT): Eliezer Lofts  This progress note covers dates of service from 11/27/19  to 01/08/20 Encounter Date: 01/08/2020   OT End of Session - 01/08/20 1740    Visit Number 20    Number of Visits 26    Date for OT Re-Evaluation 01/30/20    Authorization Type VA    Authorization - Visit Number 20    Progress Note Due on Visit 20    OT Start Time 1615    OT Stop Time 1700    OT Time Calculation (min) 45 min    Activity Tolerance Patient tolerated treatment well    Behavior During Therapy [facility] for tasks assessed/performed           Past Medical History:  Diagnosis Date  . Atrial fibrillation ([place])   . BPH (benign prostatic hyperplasia)   . CKD (chronic kidney disease)   . History of coronary angioplasty with insertion of stent   . Hypertension   . Severe sepsis ([place]) 11/04/2019  . Stroke [facility])     Past Surgical History:  Procedure Laterality Date  . COLECTOMY  2006   cancer    There were no vitals filed for this visit.   Subjective Assessment - 01/08/20 1736    Subjective  I am miserable    Patient is accompanied by: Family member    Currently in Pain? No/denies    Pain Score 0-No pain                        OT Treatments/Exercises (OP) - 01/08/20 0001      ADLs   Toileting Worked on pulling to stand with one grab bar .  Then worked on weight shifting right and left.  Facilitation of weight shift to right to allow patient to step with left foot.      Bathing Practiced tub transfer bench transfers.  Practiced transferring in both directions leading with left, and returning to right, then leading right and returning to left.  Patient actively scooting  with cueing and set up and only minimal physical assistance.  Daughter present and hopes to have patient transfer into shower this weekend as she has additional family support this weekend.                    OT Education - 01/08/20 1740    Education Details tub transfer bench transfer    Person(s) Educated Patient;Child(ren)    Methods Explanation;Demonstration    Comprehension Verbalized understanding;Returned demonstration;Need further instruction;Verbal cues required;Tactile cues required            OT Short Term Goals - 01/08/20 1742      OT SHORT TERM GOAL #1   Title Patient will complete lower body bathing with max assist due 6/19      OT SHORT TERM GOAL #2   Title Patient will bend forward while seated to pull pants up thighs    Status Achieved      OT SHORT TERM GOAL #3   Title Patient will transition from sit to stand with mod assist in prep for tolieting on commode    Status Achieved      OT SHORT TERM GOAL #4   Title  Patient/caregiver will don/doff custom RUE splint with min cueing    Status Deferred             OT Long Term Goals - 01/08/20 1614      OT LONG TERM GOAL #5   Title Patient will stand with min assist for sufficient time that caregiver can complete toilet hygiene and clothing management due 9/24    Status On-going      OT LONG TERM GOAL #6   Title Patient will complete shower transfer into and out of tub with moderate assistance    Status On-going                 Plan - 01/08/20 1741    Clinical Impression Statement Patient is showing steady progress with functional transfers such that he should be able to try and actual shower transfer this weekend.    OT Occupational Profile and History Detailed Assessment- Review of Records and additional review of physical, cognitive, psychosocial history related to current functional performance    Occupational performance deficits (Please refer to evaluation for details): ADL's;Rest and  Sleep    Body Structure / Function / Physical Skills ADL;Coordination;Endurance;GMC;UE functional use;Balance;Decreased knowledge of precautions;Sensation;Pain;Flexibility;Body mechanics;Decreased knowledge of use of DME;FMC;Proprioception;Strength;Tone;ROM;Edema;Continence;Mobility    Cognitive Skills Attention;Emotional;Energy/Drive;Memory;Orientation;Perception;Problem Solve;Safety Awareness;Sequencing    Rehab Potential Fair    Clinical Decision Making Several treatment options, min-mod task modification necessary    Comorbidities Affecting Occupational Performance: May have comorbidities impacting occupational performance    Modification or Assistance to Complete Evaluation  Min-Moderate modification of tasks or assist with assess necessary to complete eval    OT Frequency 2x / week    OT Duration 6 weeks    OT Treatment/Interventions Self-care/ADL training;Electrical Stimulation;Therapeutic exercise;Visual/perceptual remediation/compensation;Patient/family education;Splinting;Neuromuscular education;Therapeutic activities;Balance training;Cognitive remediation/compensation;Passive range of motion;Manual Therapy;DME and/or AE instruction    Plan Postural control, sitting balance, sit to lift off, weight shift right, lower body ADL weight shifts, NMR RUE    OT Home Exercise Plan Sliding right hand on thigh, tilted stool    Consulted and Agree with Plan of Care Patient;Family member/caregiver    Family Member Consulted Daughter [name]           Patient will benefit from skilled therapeutic intervention in order to improve the following deficits and impairments:   Body Structure / Function / Physical Skills: ADL, Coordination, Endurance, GMC, UE functional use, Balance, Decreased knowledge of precautions, Sensation, Pain, Flexibility, Body mechanics, Decreased knowledge of use of DME, FMC, Proprioception, Strength, Tone, ROM, Edema, Continence, Mobility Cognitive Skills: Attention, Emotional,  Energy/Drive, Memory, Orientation, Perception, Problem Solve, Safety Awareness, Sequencing     Visit Diagnosis: Hemiplegia and hemiparesis following cerebral infarction affecting right dominant side (HCC)  Unsteadiness on feet  Muscle weakness (generalized)  Neurologic neglect syndrome  Attention and concentration deficit  Stiffness of right shoulder, not elsewhere classified  Localized edema    Problem List Patient Active Problem List   Diagnosis Date Noted  . Chronic respiratory failure with hypoxia ([place]) 11/07/2019  . Pressure injury of skin 11/05/2019  . Acute renal failure superimposed on stage 4 chronic kidney disease ([place]) 11/04/2019  . Right hemiparesis ([place]) 11/04/2019  . Atrial fibrillation, chronic ([place]) 11/04/2019  . Severe sepsis ([place]) 11/04/2019  . CAP (community acquired pneumonia) 11/04/2019  . Dysphagia 11/04/2019  . Sepsis secondary to UTI ([place]) 11/03/2019  . 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 CKD (chronic kidney disease) stage 4, GFR 15-29 ml/min (HCC) 09/29/2019  .  Thrombocytopenia ([place]) 09/29/2019  . Personal history of DVT (deep vein thrombosis) 09/09/2019  . History of ischemic stroke 09/09/2019  . CAD (coronary artery disease) 09/09/2019  . Chronic constipation 09/09/2019  . History of colorectal cancer 09/09/2019  . 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 Right arm weakness 09/09/2019  . Right leg weakness 09/09/2019  . 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

## 2020-01-08 NOTE — Therapy (Signed)
[facility] [address] [place], [place], [address] Phone: [phone]   Fax:  [phone]  Physical Therapy Treatment  Patient Details  Name: Robert Mcconnell MRN: [id number] Date of Birth: 04-Feb-1922 Referring Provider (PT): Len Childs Beers   Encounter Date: 01/08/2020   PT End of Session - 01/08/20 1852    Visit Number 30    Number of Visits 32    Date for PT Re-Evaluation 12/25/19    Authorization Type [facility]    Authorization Time Period 15 visits from 7/8-11/5    Authorization - Visit Number 14    Authorization - Number of Visits 15    Progress Note Due on Visit 15    PT Start Time 1700    PT Stop Time 1962    PT Time Calculation (min) 45 min    Equipment Utilized During Treatment Gait belt    Activity Tolerance Patient tolerated treatment well    Behavior During Therapy WFL for tasks assessed/performed           Past Medical History:  Diagnosis Date   Atrial fibrillation ([place])    BPH (benign prostatic hyperplasia)    CKD (chronic kidney disease)    History of coronary angioplasty with insertion of stent    Hypertension    Severe sepsis ([place]) 11/04/2019   Stroke [facility])     Past Surgical History:  Procedure Laterality Date   COLECTOMY  2006   cancer    There were no vitals filed for this visit.   Subjective Assessment - 01/08/20 1850    Subjective no new complaints    Patient is accompained by: Family member    Pertinent History CVA    Limitations Standing;Walking;House hold activities;Sitting                Gait training: wit biodex harness with correct patient weight entered and pt was 60-80lbs unweighted:, 2 x 115' Max A x 2 (one person shifting weight and moving LE during swing phase), blocking bil knees, tactile cues to weight shift other person guiding the harness system  In body weight supported harness: Kicking the soccer ball: 20x L leg, 10x R leg, max A to support R  knee when kicking with L leg Holding L leg on top of the soccer ball while patient puts all weight on R knee, max A to stabilize R knee: 5 x 10" holds  Pt was put back in power chair for his next OT session.                            PT Short Term Goals - 12/01/19 1839      PT SHORT TERM GOAL #1   Title Patient will be able to perform sit to stand with mod A with or without AD to improve independence and reduce care giver burden    Baseline max A (eval); mod to max A (10/30/19), mox to max A (12/01/19)    Time 4    Period Weeks    Status On-going    Target Date 11/27/19      PT SHORT TERM GOAL #2   Title pt will be able to stand with mod A for 5 min to perform small reaching with L UE to improve standing balance.    Baseline able to stand with holding on to parallel bars with L UE but needs stabilization at knees to improve standing tolerance (max A  required overall) is able to reach with max A, requires mod to max A but unable to hold for 5 min while performing reaching    Time 4    Period Weeks    Status On-going      PT SHORT TERM GOAL #3   Title Pt will be able to propel chair with SBA  for 30 feet to improve household navigation    Baseline Able to propel chair with use of L UE and L LE with mod A for 30 feet (10/30/19); patient has power chair now (12/01/19)    Time 4    Period Weeks    Status Not Met    Target Date 10/06/19             PT Long Term Goals - 10/30/19 1812      PT LONG TERM GOAL #1   Title Patient will be able to stand for 5 min with CGA to improve standing endurance with or without AD    Baseline requires max A    Time 8    Period Weeks    Status On-going    Target Date 12/25/19      PT LONG TERM GOAL #2   Title Pt will require CGA with sit to stand and chair to chair transfers to improve indepdnence and reduce caregiver burden.    Baseline max to total A    Time 8    Period Weeks    Status On-going    Target Date 12/25/19       PT LONG TERM GOAL #3   Title Patient will be able to walk into walk-in shower and sit on shower chair with CGA to improve independence.    Baseline bed sponge bath only    Time 8    Period Weeks    Status On-going    Target Date 12/25/19      PT LONG TERM GOAL #4   Title Pt will demo >10/56 on BBS to improve static balance and reduce fall risk    Baseline BBS 4/56 (eval)    Time 8    Period Weeks    Status On-going    Target Date 12/25/19                 Plan - 01/08/20 1850    Clinical Impression Statement Today's skilled session was focused on continued improvement in gait training and improving weight shifting with static standing. More of patient weight was taken of with supported body weight walking today which helpd to improve patient's ability to swing L leg on his more. Patient still requires max A with weight shifting and stabilizing of knees with walking. Patient requires tactile cues to shift weight with standing and kicking ball in body weight supported harness.    Personal Factors and Comorbidities Age;Comorbidity 3+    Comorbidities hx of stroke, hx of colorectal cancer    Examination-Activity Limitations Bathing;Bed Mobility;Caring for Others;Carry;Locomotion Level;Hygiene/Grooming;Dressing;Continence;Self Feeding;Reach Overhead;Sit;Lift;Other;Transfers;Toileting;Stand;Stairs    Rehab Potential Good    PT Frequency 2x / week    PT Duration 8 weeks    PT Treatment/Interventions ADLs/Self Care Home Management;Electrical Stimulation;Gait training;Stair training;Functional mobility training;Therapeutic exercise;Balance training;Neuromuscular re-education;Therapeutic activities;Patient/family education;Wheelchair mobility training;Manual techniques;Passive range of motion    PT Next Visit Plan recertification next session           Patient will benefit from skilled therapeutic intervention in order to improve the following deficits and impairments:  Abnormal  gait, Decreased activity tolerance,  Decreased coordination, Decreased safety awareness, Decreased strength, Impaired flexibility, Impaired UE functional use, Postural dysfunction, Impaired tone, Increased edema, Decreased range of motion, Decreased knowledge of precautions, Impaired sensation, Difficulty walking, Decreased mobility, Decreased endurance, Decreased balance  Visit Diagnosis: Hemiplegia and hemiparesis following cerebral infarction affecting right dominant side (HCC)  Muscle weakness (generalized)  Unsteadiness on feet     Problem List Patient Active Problem List   Diagnosis Date Noted   Chronic respiratory failure with hypoxia ([place]) 11/07/2019   Pressure injury of skin 11/05/2019   Acute renal failure superimposed on stage 4 chronic kidney disease ([place]) 11/04/2019   Right hemiparesis ([place]) 11/04/2019   Atrial fibrillation, chronic ([place]) 11/04/2019   Severe sepsis ([place]) 11/04/2019   CAP (community acquired pneumonia) 11/04/2019   Dysphagia 11/04/2019   Sepsis secondary to UTI ([place]) 11/03/2019   Dysphagia as late effect of cerebrovascular accident (CVA) 11/03/2019   CKD (chronic kidney disease) stage 4, GFR 15-29 ml/min (HCC) 09/29/2019   Thrombocytopenia ([place]) 09/29/2019   Personal history of DVT (deep vein thrombosis) 09/09/2019   History of ischemic stroke 09/09/2019   CAD (coronary artery disease) 09/09/2019   Chronic constipation 09/09/2019   History of colorectal cancer 09/09/2019   Facial paralysis on right side 09/09/2019   Right arm weakness 09/09/2019   Right leg weakness 09/09/2019   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[place]) 09/09/2019    Kerrie Pleasure 01/08/2020, 6:53 PM  [facility] [address] [place], [place], [address] Phone: [phone]   Fax:  [phone]  Name: Zackerie Sara MRN: [id number] Date of Birth: 1921-09-26  [facility] [address] [place], [place], [address] Phone: [phone]   Fax:  [phone]  Physical Therapy Treatment  Patient Details  Name: Truth Barot MRN: [id number] Date of Birth: 1922-01-26 Referring Provider (PT): Arnell Sieving   Encounter Date: 01/08/2020   PT End of Session - 01/08/20 1852    Visit Number 30    Number of Visits 32    Date for PT Re-Evaluation 12/25/19    Authorization Type [facility]    Authorization Time Period 15 visits from 7/8-11/5    Authorization - Visit Number 14    Authorization - Number of Visits 15    Progress Note Due on Visit 15    PT Start Time 1700    PT Stop Time 1745    PT Time Calculation (min) 45 min    Equipment Utilized During Treatment Gait belt    Activity Tolerance Patient tolerated treatment well    Behavior During Therapy WFL for tasks assessed/performed           Past Medical History:  Diagnosis Date   Atrial fibrillation (HCC)    BPH (benign prostatic hyperplasia)    CKD (chronic kidney disease)    History of coronary angioplasty with insertion of stent    Hypertension    Severe sepsis ([place]) 11/04/2019   Stroke [facility])     Past Surgical History:  Procedure Laterality Date   COLECTOMY  2006   cancer    There were no vitals filed for this visit.   Subjective Assessment - 01/08/20 1850    Subjective no new complaints    Patient is accompained by: Family member    Pertinent History CVA    Limitations Standing;Walking;House hold activities;Sitting                  Gait training: wit biodex harness with correct patient  weight entered and pt was 80-100lbs unweighted:, 1 x 230' Max A x 2 (one person shifting weight and moving LE during swing phase), blocking bil knees, tactile cues to weight shift other person guiding the harness system  In body weight supported harness: Kicking the soccer ball: 20x L leg, 20x R leg, max A to support R knee when kicking with L  leg                        PT Short Term Goals - 12/01/19 1839      PT SHORT TERM GOAL #1   Title Patient will be able to perform sit to stand with mod A with or without AD to improve independence and reduce care giver burden    Baseline max A (eval); mod to max A (10/30/19), mox to max A (12/01/19)    Time 4    Period Weeks    Status On-going    Target Date 11/27/19      PT SHORT TERM GOAL #2   Title pt will be able to stand with mod A for 5 min to perform small reaching with L UE to improve standing balance.    Baseline able to stand with holding on to parallel bars with L UE but needs stabilization at knees to improve standing tolerance (max A required overall) is able to reach with max A, requires mod to max A but unable to hold for 5 min while performing reaching    Time 4    Period Weeks    Status On-going      PT SHORT TERM GOAL #3   Title Pt will be able to propel chair with SBA  for 30 feet to improve household navigation    Baseline Able to propel chair with use of L UE and L LE with mod A for 30 feet (10/30/19); patient has power chair now (12/01/19)    Time 4    Period Weeks    Status Not Met    Target Date 10/06/19             PT Long Term Goals - 10/30/19 1812      PT LONG TERM GOAL #1   Title Patient will be able to stand for 5 min with CGA to improve standing endurance with or without AD    Baseline requires max A    Time 8    Period Weeks    Status On-going    Target Date 12/25/19      PT LONG TERM GOAL #2   Title Pt will require CGA with sit to stand and chair to chair transfers to improve indepdnence and reduce caregiver burden.    Baseline max to total A    Time 8    Period Weeks    Status On-going    Target Date 12/25/19      PT LONG TERM GOAL #3   Title Patient will be able to walk into walk-in shower and sit on shower chair with CGA to improve independence.    Baseline bed sponge bath only    Time 8    Period Weeks     Status On-going    Target Date 12/25/19      PT LONG TERM GOAL #4   Title Pt will demo >10/56 on BBS to improve static balance and reduce fall risk    Baseline BBS 4/56 (eval)    Time 8    Period  Weeks    Status On-going    Target Date 12/25/19                 Plan - 01/08/20 1850    Clinical Impression Statement Today's skilled session was focused on continued improvement in gait training and improving weight shifting with static standing. More of patient weight was taken of with supported body weight walking today which helpd to improve patient's ability to swing L leg on his more. Patient still requires max A with weight shifting and stabilizing of knees with walking. Patient requires tactile cues to shift weight with standing and kicking ball in body weight supported harness.    Personal Factors and Comorbidities Age;Comorbidity 3+    Comorbidities hx of stroke, hx of colorectal cancer    Examination-Activity Limitations Bathing;Bed Mobility;Caring for Others;Carry;Locomotion Level;Hygiene/Grooming;Dressing;Continence;Self Feeding;Reach Overhead;Sit;Lift;Other;Transfers;Toileting;Stand;Stairs    Rehab Potential Good    PT Frequency 2x / week    PT Duration 8 weeks    PT Treatment/Interventions ADLs/Self Care Home Management;Electrical Stimulation;Gait training;Stair training;Functional mobility training;Therapeutic exercise;Balance training;Neuromuscular re-education;Therapeutic activities;Patient/family education;Wheelchair mobility training;Manual techniques;Passive range of motion    PT Next Visit Plan recertification next session           Patient will benefit from skilled therapeutic intervention in order to improve the following deficits and impairments:  Abnormal gait, Decreased activity tolerance, Decreased coordination, Decreased safety awareness, Decreased strength, Impaired flexibility, Impaired UE functional use, Postural dysfunction, Impaired tone, Increased edema,  Decreased range of motion, Decreased knowledge of precautions, Impaired sensation, Difficulty walking, Decreased mobility, Decreased endurance, Decreased balance  Visit Diagnosis: Hemiplegia and hemiparesis following cerebral infarction affecting right dominant side (HCC)  Muscle weakness (generalized)  Unsteadiness on feet     Problem List Patient Active Problem List   Diagnosis Date Noted   Chronic respiratory failure with hypoxia ([place]) 11/07/2019   Pressure injury of skin 11/05/2019   Acute renal failure superimposed on stage 4 chronic kidney disease ([place]) 11/04/2019   Right hemiparesis ([place]) 11/04/2019   Atrial fibrillation, chronic ([place]) 11/04/2019   Severe sepsis ([place]) 11/04/2019   CAP (community acquired pneumonia) 11/04/2019   Dysphagia 11/04/2019   Sepsis secondary to UTI ([place]) 11/03/2019   Dysphagia as late effect of cerebrovascular accident (CVA) 11/03/2019   CKD (chronic kidney disease) stage 4, GFR 15-29 ml/min ([place]) 09/29/2019   Thrombocytopenia ([place]) 09/29/2019   Personal history of DVT (deep vein thrombosis) 09/09/2019   History of ischemic stroke 09/09/2019   CAD (coronary artery disease) 09/09/2019   Chronic constipation 09/09/2019   History of colorectal cancer 09/09/2019   Facial paralysis on right side 09/09/2019   Right arm weakness 09/09/2019   Right leg weakness 09/09/2019   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

## 2020-01-09 ENCOUNTER — Ambulatory Visit (INDEPENDENT_AMBULATORY_CARE_PROVIDER_SITE_OTHER): Payer: Medicare Other

## 2020-01-09 ENCOUNTER — Ambulatory Visit (INDEPENDENT_AMBULATORY_CARE_PROVIDER_SITE_OTHER): Payer: Medicare Other | Admitting: Family Medicine

## 2020-01-09 ENCOUNTER — Encounter: Payer: Self-pay | Admitting: Family Medicine

## 2020-01-09 VITALS — BP 146/69 | HR 42 | Temp 97.7°F

## 2020-01-09 DIAGNOSIS — I2510 Atherosclerotic heart disease of native coronary artery without angina pectoris: Secondary | ICD-10-CM | POA: Diagnosis not present

## 2020-01-09 DIAGNOSIS — Z23 Encounter for immunization: Secondary | ICD-10-CM

## 2020-01-09 DIAGNOSIS — N390 Urinary tract infection, site not specified: Secondary | ICD-10-CM | POA: Diagnosis not present

## 2020-01-09 DIAGNOSIS — Z85828 Personal history of other malignant neoplasm of skin: Secondary | ICD-10-CM

## 2020-01-09 DIAGNOSIS — F482 Pseudobulbar affect: Secondary | ICD-10-CM

## 2020-01-09 DIAGNOSIS — N184 Chronic kidney disease, stage 4 (severe): Secondary | ICD-10-CM

## 2020-01-09 MED ORDER — DEXTROMETHORPHAN-QUINIDINE 20-10 MG PO CAPS: ORAL_CAPSULE | ORAL | 0 refills | Status: DC

## 2020-01-09 NOTE — Progress Notes (Signed)
Pt's daughter reports that he was tested about 4 days ago at the [facility] and was told that his flora was over 100,000. He was started on Doxy x 10 days. He is on his second day of the ABX.   She is concerned that they ([facility] just keeps giving [facility]. She believes that he may be retaining urine and they have an appointment scheduled with Urology in December. She would like to discuss getting a referral for another urologist for him to see hopefully sooner.   She would like to discuss his crying spells also. She said that he can talk to his friends and will be ok, laughing and suddenly switch to crying. When asked if he is sad he says that he is not.

## 2020-01-09 NOTE — Progress Notes (Addendum)
Established Patient Office Visit  Subjective:  Patient ID: Robert Mcconnell, male    DOB: 12-Nov-1921  Age: 86 y.o. MRN: [id number]  CC:  Chief Complaint  Patient presents with   Urinary Tract Infection   mood    ? PBA    HPI Robert Mcconnell presents for Pt's daughter reports that he was tested about 4 days ago at the [place] for a urinary tract infection.  He was told that his flora was over 100,000. He was started on Doxy x 10 days. He is on his second day of the ABX in the last 2 months for a urinary tract infection.  In fact in the end of June he was diagnosed with acute cystitis and severe sepsis.  His daughter who is with him today, Robert Mcconnell, is concerned about his potential to get very sick very quickly again and that the fact that he is having recurrence.  She wonders if he may not be emptying his bladder completely.  She says that he does hydrate during the day and does urinate about 3-4 times during the day.  Have not noticed any abnormalities in the urine.  She is concerned that [facility] ([facility] just keeps [facility]. She believes that he may be retaining urine and they have an appointment scheduled with Urology in December.  That was the first available appointment.  She would like to discuss getting a referral for another urologist for him to see hopefully sooner.   She would like to discuss his crying spells also. She said that he can talk to his friends and will be ok, laughing and suddenly switch to crying. When asked if he is sad he says that he is not.  He would really like to consider treatment.  He has seen dermatology and they did remove several basal cell skin cancers.  Past Medical History:  Diagnosis Date   Atrial fibrillation (HCC)    BPH (benign prostatic hyperplasia)    CKD (chronic kidney disease)    History of coronary angioplasty with insertion of stent    Hypertension    Severe sepsis ([place]) 11/04/2019   Stroke [facility])     Past Surgical History:  Procedure  Laterality Date   COLECTOMY  2006   cancer    History reviewed. No pertinent family history.  Social History   Socioeconomic History   Marital status: Married    Spouse name: Not on file   Number of children: Not on file   Years of education: Not on file   Highest education level: Not on file  Occupational History   Occupation: retired  Tobacco Use   Smoking status: Never Smoker   Smokeless tobacco: Never Used  Substance and Sexual Activity   Alcohol use: Yes    Comment: occasionally   Drug use: Never   Sexual activity: Not Currently    Partners: Female  Other Topics Concern   Not on file  Social History Narrative   Not on file   Social Determinants of Health   Financial Resource Strain:    Difficulty of Paying Living Expenses: Not on file  Food Insecurity:    Worried About Charity fundraiser in the Last Year: Not on file   [place] in the Last Year: Not on file  Transportation Needs:    Lack of Transportation (Medical): Not on file   Lack of Transportation (Non-Medical): Not on file  Physical Activity:    Days of Exercise per Week: Not  on file   Minutes of Exercise per Session: Not on file  Stress:    Feeling of Stress : Not on file  Social Connections:    Frequency of Communication with Friends and Family: Not on file   Frequency of Social Gatherings with Friends and Family: Not on file   Attends Religious Services: Not on file   Active Member of Clubs or Organizations: Not on file   Attends Archivist Meetings: Not on file   Marital Status: Not on file  Intimate Partner Violence:    Fear of Current or Ex-Partner: Not on file   Emotionally Abused: Not on file   Physically Abused: Not on file   Sexually Abused: Not on file    Outpatient Medications Prior to Visit  Medication Sig Dispense Refill   apixaban (ELIQUIS) 2.5 MG TABS tablet Take 2.5 mg by mouth 2 (two) times daily.      atorvastatin (LIPITOR)  40 MG tablet Take 40 mg by mouth at bedtime.      calcitRIOL (ROCALTROL) 1 MCG/ML solution Take 0.25 mcg by mouth every Monday, Wednesday, and Friday.      Docusate Sodium 150 MG/15ML syrup Take 150 mg by mouth daily.      famotidine (PEPCID) 20 MG tablet Take 20 mg by mouth daily.      finasteride (PROSCAR) 5 MG tablet Take 5 mg by mouth daily.     levothyroxine (SYNTHROID) 25 MCG tablet Take 25 mcg by mouth daily before breakfast.     melatonin 3 MG TABS tablet Take 9 mg by mouth at bedtime.     metoprolol succinate (TOPROL-XL) 25 MG 24 hr tablet Take 0.5 tablets (12.5 mg total) by mouth daily. 30 tablet 3   mirtazapine (REMERON) 15 MG tablet Take 15 mg by mouth at bedtime.     senna (SENOKOT) 8.6 MG TABS tablet Take 1 tablet by mouth daily.     terazosin (HYTRIN) 5 MG capsule Take 5 mg by mouth at bedtime.     Zinc Sulfate (ZINC 15 PO) Take 15 mg by mouth 2 (two) times daily.     No facility-administered medications prior to visit.    No Known Allergies  ROS Review of Systems    Objective:    Physical Exam Vitals reviewed.  Constitutional:      Appearance: He is well-developed.  HENT:     Head: Normocephalic and atraumatic.  Eyes:     Conjunctiva/sclera: Conjunctivae normal.  Pulmonary:     Effort: Pulmonary effort is normal.  Skin:    General: Skin is dry.     Coloration: Skin is not pale.  Neurological:     Mental Status: He is alert and oriented to person, place, and time.  Psychiatric:        Behavior: Behavior normal.     BP (!) 146/69    Pulse (!) 42    Temp 97.7 F (36.5 C)    SpO2 96%  Wt Readings from Last 3 Encounters:  11/25/19 170 lb (77.1 kg)  11/03/19 164 lb 6.4 oz (74.6 kg)  09/09/19 176 lb (79.8 kg)     There are no preventive care reminders to display for this patient.  There are no preventive care reminders to display for this patient.  Lab Results  Component Value Date   TSH 3.379 11/03/2019   Lab Results  Component Value  Date   WBC 4.3 11/06/2019   HGB 10.5 (L) 11/06/2019   HCT 34.0 (L)  11/06/2019   MCV 98.8 11/06/2019   PLT 80 (L) 11/06/2019   Lab Results  Component Value Date   NA 144 11/07/2019   K 3.7 11/07/2019   CO2 25 11/07/2019   GLUCOSE 114 (H) 11/07/2019   BUN 29 (H) 11/07/2019   CREATININE 1.67 (H) 11/07/2019   BILITOT 0.7 11/06/2019   ALKPHOS 66 11/06/2019   AST 24 11/06/2019   ALT 19 11/06/2019   PROT 5.6 (L) 11/06/2019   ALBUMIN 2.3 (L) 11/06/2019   CALCIUM 8.1 (L) 11/07/2019   ANIONGAP 7 11/07/2019   Lab Results  Component Value Date   CHOL 98 11/05/2019   Lab Results  Component Value Date   HDL 28 (L) 11/05/2019   Lab Results  Component Value Date   LDLCALC 54 11/05/2019   Lab Results  Component Value Date   TRIG 79 11/05/2019   Lab Results  Component Value Date   CHOLHDL 3.5 11/05/2019   No results found for: HGBA1C    Assessment & Plan:   Problem List Items Addressed This Visit      Musculoskeletal and Integument   History of basal cell cancer     Genitourinary   Recurrent UTI    Is like he is at least on appropriate treatment currently for his active UTI based on a culture that was done at the [place].  We will try to get him in with urology here locally so that he does not have to wait until December his daughter is worried that he will end up with more infections between now and then.  He did actually urinate while he was here in the office so we did order a post void residual just to see how much urine might be remaining.  There was about a 30 to 40-minute wait after urination before we could get him scanned.      Relevant Orders   Ambulatory referral to Urology   US PELVIS LIMITED (TRANSABDOMINAL ONLY)   CKD (chronic kidney disease) stage 4, GFR 15-29 ml/min (HCC)    Reports that they have been following with a nephrologist and fortunately the last 2 times that he had his renal function checked his GFR has actually increased into the 30s which is  fantastic.        Other   Pseudobulbar affect    No underlying depression.  Discussed options.  We will try Nuedexta and see if this is helpful.  It is becoming disruptive to his conversations with friends and so would like to do a trial of treatment.  Did discuss that this was not a cure.      Relevant Medications   Dextromethorphan-quiNIDine (NUEDEXTA) 20-10 MG capsule    Other Visit Diagnoses    Need for immunization against influenza    -  Primary   Relevant Orders   Flu Vaccine QUAD High Dose(Fluad) (Completed)     Flu Vaccine given today.  Meds ordered this encounter  Medications   Dextromethorphan-quiNIDine (NUEDEXTA) 20-10 MG capsule    Sig: Take 1 capsule by mouth daily for 7 days, THEN 1 capsule 2 (two) times daily for 23 days.    Dispense:  53 capsule    Refill:  0    Follow-up: Return if symptoms worsen or fail to improve.    Beatrice Lecher, MD

## 2020-01-09 NOTE — Assessment & Plan Note (Signed)
Is like he is at least on appropriate treatment currently for his active UTI based on a culture that was done at the [place].  We will try to get him in with urology here locally so that he does not have to wait until December his daughter is worried that he will end up with more infections between now and then.  He did actually urinate while he was here in the office so we did order a post void residual just to see how much urine might be remaining.  There was about a 30 to 40-minute wait after urination before we could get him scanned.

## 2020-01-09 NOTE — Assessment & Plan Note (Addendum)
No underlying depression.  Discussed options.  We will try Nuedexta and see if this is helpful.  It is becoming disruptive to his conversations with friends and so would like to do a trial of treatment.  Did discuss that this was not a cure.

## 2020-01-09 NOTE — Assessment & Plan Note (Signed)
Reports that they have been following with a nephrologist and fortunately the last 2 times that he had his renal function checked his GFR has actually increased into the 30s which is fantastic.

## 2020-01-13 ENCOUNTER — Encounter: Payer: Self-pay | Admitting: Family Medicine

## 2020-01-13 ENCOUNTER — Encounter: Payer: Self-pay | Admitting: Occupational Therapy

## 2020-01-13 ENCOUNTER — Ambulatory Visit: Payer: No Typology Code available for payment source | Admitting: Occupational Therapy

## 2020-01-13 ENCOUNTER — Other Ambulatory Visit: Payer: Self-pay

## 2020-01-13 ENCOUNTER — Ambulatory Visit: Payer: No Typology Code available for payment source

## 2020-01-13 DIAGNOSIS — R2681 Unsteadiness on feet: Secondary | ICD-10-CM

## 2020-01-13 DIAGNOSIS — I69351 Hemiplegia and hemiparesis following cerebral infarction affecting right dominant side: Secondary | ICD-10-CM

## 2020-01-13 DIAGNOSIS — R414 Neurologic neglect syndrome: Secondary | ICD-10-CM

## 2020-01-13 DIAGNOSIS — M25611 Stiffness of right shoulder, not elsewhere classified: Secondary | ICD-10-CM

## 2020-01-13 DIAGNOSIS — R41840 Attention and concentration deficit: Secondary | ICD-10-CM

## 2020-01-13 DIAGNOSIS — M6281 Muscle weakness (generalized): Secondary | ICD-10-CM

## 2020-01-13 DIAGNOSIS — R600 Localized edema: Secondary | ICD-10-CM

## 2020-01-13 NOTE — Therapy (Addendum)
[facility] [address] [place], [place], [address] Phone: [phone]   Fax:  [phone]  Physical Therapy Therapy Re-certification  Patient Details  Name: Robert Mcconnell MRN: [id number] Date of Birth: 01/28/1922 Referring Provider (PT): Arnell Sieving   Encounter Date: 01/13/2020   PT End of Session - 01/13/20 1820    Visit Number 31    Number of Visits 32    Date for PT Re-Evaluation 12/25/19    [facility] Type [facility], eval 01/06/62, recert 12/09/64    Authorization Time Period 15 visits from 7/8-11/5    Authorization - Visit Number 15    Authorization - Number of Visits 15    Progress Note Due on Visit 15    Equipment Utilized During Treatment Gait belt    Activity Tolerance Patient tolerated treatment well    Behavior During Therapy [facility] for tasks assessed/performed           Past Medical History:  Diagnosis Date  . Atrial fibrillation ([place])   . BPH (benign prostatic hyperplasia)   . CKD (chronic kidney disease)   . History of coronary angioplasty with insertion of stent   . Hypertension   . Severe sepsis ([place]) 11/04/2019  . Stroke [facility])     Past Surgical History:  Procedure Laterality Date  . COLECTOMY  2006   cancer    There were no vitals filed for this visit.   Subjective Assessment - 01/13/20 1814    Subjective daughte rreports that he took shower in the shower for the first time. He was able to trasnfer from power chair to shower chair with help    Patient is accompained by: Family member    Pertinent History CVA    Limitations Standing;Walking;House hold activities;Sitting                 Gait training: wit biodex harness with correct patient weight entered and pt was 80-100lbs unweighted:, 1 x 115' Max A x 2 (one person shifting weight and moving LE during swing phase), max A to shift weight to L side during L stance phase, pt was able to advance R LE with keeping trunk leaned  towards L by PT during L stance phase.  Partial sit to stand: pt uses L arm rest: 2 x 10, chair + 2 airex cushions, mod A  Sit to stand with holding hemi walker: 3x mod to max A Standing with hemi walker with weight shifting to L side and holding for 5 sec: 10x  Gait training: with hemi walker: max A 3 steps                          PT Short Term Goals - 01/13/20 1836      PT SHORT TERM GOAL #1   Title Patient will be able to perform sit to stand with mod A with or without AD to improve independence and reduce care giver burden    Baseline max A (eval); mod to max A (10/30/19), mox to max A (12/01/19); mod A with 1 UE support (01/13/20)    Time 4    Period Weeks    Status Achieved    Target Date 11/27/19      PT SHORT TERM GOAL #2   Title pt will be able to stand with mod A for 5 min to perform small reaching with L UE to improve standing balance.    Baseline able to stand  with holding on to parallel bars with L UE but needs stabilization at knees to improve standing tolerance (max A required overall) is able to reach with max A, requires mod to max A but unable to hold for 5 min while performing reaching. Requires mod to max A due to inconsistency (01/13/20)    Time 4    Period Weeks    Status On-going    Target Date 02/10/20      PT SHORT TERM GOAL #3   Title Pt will be able to propel chair with SBA  for 30 feet to improve household navigation    Baseline Able to propel chair with use of L UE and L LE with mod A for 30 feet (10/30/19); patient has power chair now (12/01/19)    Time 4    Period Weeks    Status Not Met    Target Date 10/06/19             PT Long Term Goals - 01/13/20 1838      PT LONG TERM GOAL #1   Title Patient will be able to stand for 5 min with CGA to improve standing endurance with or without AD    Baseline requires max A    Time 8    Period Weeks    Status On-going    Target Date 03/09/20      PT LONG TERM GOAL #2   Title  Patient will be able to perform chair to bed transfer using hemi walker and mod A to reduce caregiver burden    Baseline max A with stand pivot transfers    Time 8    Period Weeks    Status On-going    Target Date 03/09/20      PT LONG TERM GOAL #3   Title Pt will be able to ambulate 10 feet with hemi walker and mod to max A to improve in home mobility    Baseline total A with body weight support system    Time 8    Period Weeks    Status Revised      PT LONG TERM GOAL #4   Title Pt will demo >10/56 on BBS to improve static balance and reduce fall risk    Baseline BBS 4/56 (eval)    Time 8    Period Weeks    Status On-going    Target Date 03/09/20                 Plan - 01/13/20 1814    Clinical Impression Statement Patient has been seen for total of 31 sessions from 09/08/19 to 01/13/20. Patient is making slow progress towards his short term and long term goals. Pt met 1/3 short term goals. Most of the short term and long term goals were revised due to slower than expected progress. Overall patient still requires mod A with sit to stand transfers but he is able to stand for 5-10 min with 1 UE support. Patient still requires max to total A with amulation in body weight supported harness. patient still has difficulty with shifting weight to left side consistently with standing or walking due to R sided neglect. Pt will continue to benefit from skilled PT to improve transfers and short distance ambulation to reduce caregiver burden and improve independence.    Personal Factors and Comorbidities Age;Comorbidity 3+    Comorbidities hx of stroke, hx of colorectal cancer    Examination-Activity Limitations Bathing;Bed Mobility;Caring for Others;Carry;Locomotion Level;Hygiene/Grooming;Dressing;Continence;Self Feeding;Reach  Overhead;Sit;Lift;Other;Transfers;Toileting;Stand;Stairs    Rehab Potential Good    PT Frequency 2x / week    PT Duration 8 weeks    PT Treatment/Interventions  ADLs/Self Care Home Management;Electrical Stimulation;Gait training;Stair training;Functional mobility training;Therapeutic exercise;Balance training;Neuromuscular re-education;Therapeutic activities;Patient/family education;Wheelchair mobility training;Manual techniques;Passive range of motion    PT Next Visit Plan recertification next session    Consulted and Agree with Plan of Care Patient;Family member/caregiver    Family Member Consulted daughter           Patient will benefit from skilled therapeutic intervention in order to improve the following deficits and impairments:  Abnormal gait, Decreased activity tolerance, Decreased coordination, Decreased safety awareness, Decreased strength, Impaired flexibility, Impaired UE functional use, Postural dysfunction, Impaired tone, Increased edema, Decreased range of motion, Decreased knowledge of precautions, Impaired sensation, Difficulty walking, Decreased mobility, Decreased endurance, Decreased balance  Visit Diagnosis: Hemiplegia and hemiparesis following cerebral infarction affecting right dominant side (HCC)  Unsteadiness on feet  Muscle weakness (generalized)  Neurologic neglect syndrome     Problem List Patient Active Problem List   Diagnosis Date Noted  . Recurrent UTI 01/09/2020  . History of basal cell cancer 01/09/2020  . Chronic respiratory failure with hypoxia ([place]) 11/07/2019  . Pressure injury of skin 11/05/2019  . Acute renal failure superimposed on stage 4 chronic kidney disease ([place]) 11/04/2019  . Right hemiparesis ([place]) 11/04/2019  . Atrial fibrillation, chronic ([place]) 11/04/2019  . Dysphagia 11/04/2019  . 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 CKD (chronic kidney disease) stage 4, GFR 15-29 ml/min (HCC) 09/29/2019  . Thrombocytopenia ([place]) 09/29/2019  . Personal history of DVT (deep vein thrombosis) 09/09/2019  . History of ischemic stroke 09/09/2019  . CAD (coronary artery  disease) 09/09/2019  . Chronic constipation 09/09/2019  . History of colorectal cancer 09/09/2019  . 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 Right arm weakness 09/09/2019  . Right leg weakness 09/09/2019  . Pseudobulbar affect 09/09/2019    Kerrie Pleasure 01/13/2020, 6:46 PM  [facility] [address] [place], [place], [address] Phone: [phone]   Fax:  [phone]  Name: Robert Mcconnell MRN: [id number] Date of Birth: [birth year]

## 2020-01-13 NOTE — Telephone Encounter (Signed)
Sent referral with ov notes and insurance to [facility] at [phone] and [phone]. They will call and schedule with patient for good appointment time

## 2020-01-13 NOTE — Therapy (Signed)
[facility] [address] [employer] [place], [place], [address] Phone: [phone]   Fax:  [phone]  Occupational Therapy Treatment  Patient Details  Name: Robert Mcconnell MRN: [id number] Date of Birth: [birth year] Referring Provider (OT): Eliezer Lofts   Encounter Date: 01/13/2020   OT End of Session - 01/13/20 1811    Visit Number 21    Number of Visits 26    Date for OT Re-Evaluation 01/30/20    Authorization Type [facility]    Authorization - Visit Number 21    Authorization - Number of Visits 30    Progress Note Due on Visit 30    OT Start Time 4008    OT Stop Time 1745    OT Time Calculation (min) 40 min    Activity Tolerance Patient tolerated treatment well    Behavior During Therapy [facility] for tasks assessed/performed           Past Medical History:  Diagnosis Date  . Atrial fibrillation ([place])   . BPH (benign prostatic hyperplasia)   . CKD (chronic kidney disease)   . History of coronary angioplasty with insertion of stent   . Hypertension   . Severe sepsis ([place]) 11/04/2019  . Stroke [facility])     Past Surgical History:  Procedure Laterality Date  . COLECTOMY  2006   cancer    There were no vitals filed for this visit.   Subjective Assessment - 01/13/20 1805    Subjective  Patient's daughter indicates that he has a bladder obstruction    Patient is accompanied by: Family member    Currently in Pain? No/denies    Pain Score 0-No pain                        OT Treatments/Exercises (OP) - 01/13/20 0001      ADLs   Toileting Worked on toilet transfer with attempts to closely simulate patient's bathroom at home.  Patient able to use grab bar in front of wheelchair to pull to stand, then with mod assist, able to step around to sit on commode.  Diffiuclt then to pull to stand to return to wheelchait - moving toward right side.  Able to complete with max assist and slide board.  With second attempt used drop  arm commode to transfer  to increase height of sitting surface. Daughter present to help with simulation to make as close as possible to home environment,  Patient able to transfer self toward left with intermittent min assist and grab bar, and to right (closer to level surface) and mod assist.      Bathing Patient's two daughters were able to shower him this weekend.  Discussed challenges - but overall was successful!        Neurological Re-education Exercises   Other Exercises 1 Neuromuscular reeducation to address sit to stand with decreased reliance on pulling self to stand.  Stood from wheelchair to high mat table.  Able to lean forward and push down into arm, but not pull self to standing.                    OT Education - 01/13/20 1811    Education Details toilet transfers    Person(s) Educated Patient;Child(ren)    Methods Explanation;Demonstration;Tactile cues;Verbal cues    Comprehension Need further instruction;Verbal cues required;Tactile cues required            OT Short Term Goals - 01/13/20 1812  OT SHORT TERM GOAL #1   Title Patient will complete lower body bathing with max assist due 6/19      OT SHORT TERM GOAL #2   Title Patient will bend forward while seated to pull pants up thighs    Status Achieved      OT SHORT TERM GOAL #3   Title Patient will transition from sit to stand with mod assist in prep for tolieting on commode    Status Achieved      OT SHORT TERM GOAL #4   Title Patient/caregiver will don/doff custom RUE splint with min cueing    Status Deferred             OT Long Term Goals - 01/13/20 1813      OT LONG TERM GOAL #1   Title Patient will transiton from sit to stand with mod assist to allow toileting, clothing management, and hygiene    Time 6    Period Weeks    Status Achieved      OT LONG TERM GOAL #2   Title Patient/caregiver will complete a home exercise program to prevent pain, and improve passive motion in RUE to  aide with hygiene and dressing tasks    Baseline Patient with pain throughout RUE    Time 6    Period Weeks    Status Achieved      OT LONG TERM GOAL #3   Title Patient / caregiver will demonstrate effective edema management techniques for right hand, and support for right arm to prevent further subluxation    Time 6    Period Weeks    Status Achieved      OT LONG TERM GOAL #4   Title Patient will attend to right arm, and manage right arm during transitional movements and in wheelchair with min cueing    Time 6    Period Weeks    Status On-going      OT LONG TERM GOAL #5   Title Patient will stand with min assist for sufficient time that caregiver can complete toilet hygiene and clothing management due 9/24    Status On-going      OT LONG TERM GOAL #6   Title Patient will complete shower transfer into and out of tub with moderate assistance    Time 8    Period Weeks    Status On-going                 Plan - 01/13/20 1812    Clinical Impression Statement Patient is showing steady progress with functional transfers and is working toward ADL goals of toileting and showering    OT Occupational Profile and History Detailed Assessment- Review of Records and additional review of physical, cognitive, psychosocial history related to current functional performance    Occupational performance deficits (Please refer to evaluation for details): ADL's;Rest and Sleep    Body Structure / Function / Physical Skills ADL;Coordination;Endurance;GMC;UE functional use;Balance;Decreased knowledge of precautions;Sensation;Pain;Flexibility;Body mechanics;Decreased knowledge of use of DME;FMC;Proprioception;Strength;Tone;ROM;Edema;Continence;Mobility    Cognitive Skills Attention;Emotional;Energy/Drive;Memory;Orientation;Perception;Problem Solve;Safety Awareness;Sequencing    Rehab Potential Fair    Clinical Decision Making Several treatment options, min-mod task modification necessary     Comorbidities Affecting Occupational Performance: May have comorbidities impacting occupational performance    Modification or Assistance to Complete Evaluation  Min-Moderate modification of tasks or assist with assess necessary to complete eval    OT Frequency 2x / week    OT Duration 6 weeks    OT Treatment/Interventions Self-care/ADL  training;Electrical Stimulation;Therapeutic exercise;Visual/perceptual remediation/compensation;Patient/family education;Splinting;Neuromuscular education;Therapeutic activities;Balance training;Cognitive remediation/compensation;Passive range of motion;Manual Therapy;DME and/or AE instruction    Plan Postural control, sitting balance, sit to lift off, weight shift right, lower body ADL weight shifts, NMR RUE    OT Home Exercise Plan Sliding right hand on thigh, tilted stool    Consulted and Agree with Plan of Care Patient;Family member/caregiver    Family Member Consulted Daughter [name]           Patient will benefit from skilled therapeutic intervention in order to improve the following deficits and impairments:   Body Structure / Function / Physical Skills: ADL, Coordination, Endurance, GMC, UE functional use, Balance, Decreased knowledge of precautions, Sensation, Pain, Flexibility, Body mechanics, Decreased knowledge of use of DME, FMC, Proprioception, Strength, Tone, ROM, Edema, Continence, Mobility Cognitive Skills: Attention, Emotional, Energy/Drive, Memory, Orientation, Perception, Problem Solve, Safety Awareness, Sequencing     Visit Diagnosis: Hemiplegia and hemiparesis following cerebral infarction affecting right dominant side (HCC)  Unsteadiness on feet  Muscle weakness (generalized)  Neurologic neglect syndrome  Attention and concentration deficit  Stiffness of right shoulder, not elsewhere classified  Localized edema    Problem List Patient Active Problem List   Diagnosis Date Noted  . Recurrent UTI 01/09/2020  . History of  basal cell cancer 01/09/2020  . Chronic respiratory failure with hypoxia ([place]) 11/07/2019  . Pressure injury of skin 11/05/2019  . Acute renal failure superimposed on stage 4 chronic kidney disease ([place]) 11/04/2019  . Right hemiparesis ([place]) 11/04/2019  . Atrial fibrillation, chronic ([place]) 11/04/2019  . Dysphagia 11/04/2019  . 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 CKD (chronic kidney disease) stage 4, GFR 15-29 ml/min (HCC) 09/29/2019  . Thrombocytopenia ([place]) 09/29/2019  . Personal history of DVT (deep vein thrombosis) 09/09/2019  . History of ischemic stroke 09/09/2019  . CAD (coronary artery disease) 09/09/2019  . Chronic constipation 09/09/2019  . History of colorectal cancer 09/09/2019  . 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 Right arm weakness 09/09/2019  . Right leg weakness 09/09/2019  . Pseudobulbar affect 09/09/2019    Mariah Milling, OTR/L 01/13/2020, 6:14 PM  [facility] [address] [place], [place], [address] Phone: [phone]   Fax:  [phone]  Name: Robert Mcconnell MRN: [id number] Date of Birth: May 01, 1922

## 2020-01-13 NOTE — Addendum Note (Signed)
Addended by: Kerrie Pleasure on: 01/13/2020 06:51 PM   Modules accepted: Orders

## 2020-01-15 ENCOUNTER — Encounter: Payer: Self-pay | Admitting: Occupational Therapy

## 2020-01-15 ENCOUNTER — Encounter: Payer: Self-pay | Admitting: Family Medicine

## 2020-01-15 ENCOUNTER — Other Ambulatory Visit: Payer: Self-pay

## 2020-01-15 ENCOUNTER — Ambulatory Visit: Payer: No Typology Code available for payment source

## 2020-01-15 ENCOUNTER — Ambulatory Visit: Payer: No Typology Code available for payment source | Admitting: Occupational Therapy

## 2020-01-15 DIAGNOSIS — I69351 Hemiplegia and hemiparesis following cerebral infarction affecting right dominant side: Secondary | ICD-10-CM | POA: Diagnosis not present

## 2020-01-15 DIAGNOSIS — R600 Localized edema: Secondary | ICD-10-CM

## 2020-01-15 DIAGNOSIS — R414 Neurologic neglect syndrome: Secondary | ICD-10-CM

## 2020-01-15 DIAGNOSIS — R2681 Unsteadiness on feet: Secondary | ICD-10-CM

## 2020-01-15 DIAGNOSIS — M6281 Muscle weakness (generalized): Secondary | ICD-10-CM

## 2020-01-15 DIAGNOSIS — M25611 Stiffness of right shoulder, not elsewhere classified: Secondary | ICD-10-CM

## 2020-01-15 DIAGNOSIS — R41840 Attention and concentration deficit: Secondary | ICD-10-CM

## 2020-01-15 NOTE — Therapy (Signed)
[facility] [address] [place], [place], [address] Phone: [phone]   Fax:  [phone]  Occupational Therapy Treatment  Patient Details  Name: Newton Frutiger MRN: [id number] Date of Birth: 20-Aug-1921 Referring Provider (OT): Eliezer Lofts   Encounter Date: 01/15/2020   OT End of Session - 01/15/20 1744    Visit Number 22    Number of Visits 26    Date for OT Re-Evaluation 01/30/20    Authorization Type VA    Authorization - Visit Number 7    Authorization - Number of Visits 30    Progress Note Due on Visit 30    OT Start Time 1700    OT Stop Time 1742    OT Time Calculation (min) 42 min    Activity Tolerance Patient tolerated treatment well    Behavior During Therapy [facility] for tasks assessed/performed           Past Medical History:  Diagnosis Date  . Atrial fibrillation ([place])   . BPH (benign prostatic hyperplasia)   . CKD (chronic kidney disease)   . History of coronary angioplasty with insertion of stent   . Hypertension   . Severe sepsis ([place]) 11/04/2019  . Stroke [facility])     Past Surgical History:  Procedure Laterality Date  . COLECTOMY  2006   cancer    There were no vitals filed for this visit.   Subjective Assessment - 01/15/20 1707    Patient is accompanied by: Family member    Currently in Pain? No/denies    Pain Score 0-No pain                        OT Treatments/Exercises (OP) - 01/15/20 0001      Neurological Re-education Exercises   Other Exercises 1 Neuromuscular reeducation to address postural control in sitting, standing, and transition from sit to stand, stand to sit.  Patient able to stand for more than one minute to complete box and blocks assessment with left hand.  Left hand not in support.  Patient needing frequent cues to lift chest and maintain weight in midline.  Worked on standing on right leg with support at right knee and advancing left leg.                       OT Short Term Goals - 01/13/20 1812      OT SHORT TERM GOAL #1   Title Patient will complete lower body bathing with max assist due 6/19      OT SHORT TERM GOAL #2   Title Patient will bend forward while seated to pull pants up thighs    Status Achieved      OT SHORT TERM GOAL #3   Title Patient will transition from sit to stand with mod assist in prep for tolieting on commode    Status Achieved      OT SHORT TERM GOAL #4   Title Patient/caregiver will don/doff custom RUE splint with min cueing    Status Deferred             OT Long Term Goals - 01/13/20 1813      OT LONG TERM GOAL #1   Title Patient will transiton from sit to stand with mod assist to allow toileting, clothing management, and hygiene    Time 6    Period Weeks    Status Achieved      OT  LONG TERM GOAL #2   Title Patient/caregiver will complete a home exercise program to prevent pain, and improve passive motion in RUE to aide with hygiene and dressing tasks    Baseline Patient with pain throughout RUE    Time 6    Period Weeks    Status Achieved      OT LONG TERM GOAL #3   Title Patient / caregiver will demonstrate effective edema management techniques for right hand, and support for right arm to prevent further subluxation    Time 6    Period Weeks    Status Achieved      OT LONG TERM GOAL #4   Title Patient will attend to right arm, and manage right arm during transitional movements and in wheelchair with min cueing    Time 6    Period Weeks    Status On-going      OT LONG TERM GOAL #5   Title Patient will stand with min assist for sufficient time that caregiver can complete toilet hygiene and clothing management due 9/24    Status On-going      OT LONG TERM GOAL #6   Title Patient will complete shower transfer into and out of tub with moderate assistance    Time 8    Period Weeks    Status On-going                 Plan - 01/15/20 1745    Clinical Impression  Statement Patient is showing steady progress with transitionalmovments and is working toward ADL goals of toileting and showering    OT Occupational Profile and History Detailed Assessment- Review of Records and additional review of physical, cognitive, psychosocial history related to current functional performance    Occupational performance deficits (Please refer to evaluation for details): ADL's;Rest and Sleep    Body Structure / Function / Physical Skills ADL;Coordination;Endurance;GMC;UE functional use;Balance;Decreased knowledge of precautions;Sensation;Pain;Flexibility;Body mechanics;Decreased knowledge of use of DME;FMC;Proprioception;Strength;Tone;ROM;Edema;Continence;Mobility    Cognitive Skills Attention;Emotional;Energy/Drive;Memory;Orientation;Perception;Problem Solve;Safety Awareness;Sequencing    Rehab Potential Fair    Clinical Decision Making Several treatment options, min-mod task modification necessary    Comorbidities Affecting Occupational Performance: May have comorbidities impacting occupational performance    Modification or Assistance to Complete Evaluation  Min-Moderate modification of tasks or assist with assess necessary to complete eval    OT Frequency 2x / week    OT Duration 6 weeks    OT Treatment/Interventions Self-care/ADL training;Electrical Stimulation;Therapeutic exercise;Visual/perceptual remediation/compensation;Patient/family education;Splinting;Neuromuscular education;Therapeutic activities;Balance training;Cognitive remediation/compensation;Passive range of motion;Manual Therapy;DME and/or AE instruction    Plan Postural control, sitting balance, sit to lift off, weight shift right, lower body ADL weight shifts, NMR RUE    OT Home Exercise Plan Sliding right hand on thigh, tilted stool    Consulted and Agree with Plan of Care Patient;Family member/caregiver    Family Member Consulted Daughter [name]           Patient will benefit from skilled therapeutic  intervention in order to improve the following deficits and impairments:   Body Structure / Function / Physical Skills: ADL, Coordination, Endurance, GMC, UE functional use, Balance, Decreased knowledge of precautions, Sensation, Pain, Flexibility, Body mechanics, Decreased knowledge of use of DME, FMC, Proprioception, Strength, Tone, ROM, Edema, Continence, Mobility Cognitive Skills: Attention, Emotional, Energy/Drive, Memory, Orientation, Perception, Problem Solve, Safety Awareness, Sequencing     Visit Diagnosis: Hemiplegia and hemiparesis following cerebral infarction affecting right dominant side (HCC)  Unsteadiness on feet  Muscle weakness (generalized)  Neurologic neglect syndrome  Attention and concentration deficit  Stiffness of right shoulder, not elsewhere classified  Localized edema    Problem List Patient Active Problem List   Diagnosis Date Noted  . Recurrent UTI 01/09/2020  . History of basal cell cancer 01/09/2020  . Chronic respiratory failure with hypoxia ([place]) 11/07/2019  . Pressure injury of skin 11/05/2019  . Acute renal failure superimposed on stage 4 chronic kidney disease ([place]) 11/04/2019  . Right hemiparesis ([place]) 11/04/2019  . Atrial fibrillation, chronic ([place]) 11/04/2019  . Dysphagia 11/04/2019  . 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 CKD (chronic kidney disease) stage 4, GFR 15-29 ml/min (HCC) 09/29/2019  . Thrombocytopenia ([place]) 09/29/2019  . Personal history of DVT (deep vein thrombosis) 09/09/2019  . History of ischemic stroke 09/09/2019  . CAD (coronary artery disease) 09/09/2019  . Chronic constipation 09/09/2019  . History of colorectal cancer 09/09/2019  . 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 Right arm weakness 09/09/2019  . Right leg weakness 09/09/2019  . Pseudobulbar affect 09/09/2019    Mariah Milling, OTR/L 01/15/2020, 5:46 PM  [facility] [address] [place], [place], [address] Phone: [phone]   Fax:  [phone]  Name: Jamelle Goldston MRN: [id number] Date of Birth: 1921-11-29

## 2020-01-15 NOTE — Therapy (Addendum)
[facility] [address] [place], [place], [address] Phone: [phone]   Fax:  [phone]  Physical Therapy Therapy Recertification  Patient Details  Name: Robert Mcconnell MRN: [id number] Date of Birth: 12-30-1921 Referring Provider (PT): Arnell Sieving   Encounter Date: 01/15/2020   PT End of Session - 01/15/20 1904    Visit Number 32    Number of Visits 32    Date for PT Re-Evaluation 12/25/19    Authorization Type VA, eval 0/3/54, recert 10/11/66    Authorization Time Period 15 visits from 7/8-11/5    Authorization - Visit Number 16    Authorization - Number of Visits 15    Progress Note Due on Visit 15    PT Start Time 1615    PT Stop Time 1700    PT Time Calculation (min) 45 min    Equipment Utilized During Treatment Gait belt    Activity Tolerance Patient tolerated treatment well    Behavior During Therapy [facility] for tasks assessed/performed           Past Medical History:  Diagnosis Date  . Atrial fibrillation ([place])   . BPH (benign prostatic hyperplasia)   . CKD (chronic kidney disease)   . History of coronary angioplasty with insertion of stent   . Hypertension   . Severe sepsis ([place]) 11/04/2019  . Stroke [facility])     Past Surgical History:  Procedure Laterality Date  . COLECTOMY  2006   cancer    There were no vitals filed for this visit.   Subjective Assessment - 01/15/20 1902    Subjective no new complaints    Patient is accompained by: Family member    Pertinent History CVA    Limitations Standing;Walking;House hold activities;Sitting                        Gait training: wit biodex harness with correct patient weight entered and pt was 80-100lbs unweighted:, 1 x 115' Max A x 2 (one person shifting weight and moving LE during swing phase), max A to shift weight to L side during L stance phase, pt was able to advance R LE with keeping trunk leaned towards L by PT during L  stance phase.  Partial sit to stand: pt uses L arm rest x 10, chair + 2 airex cushions, mod A  Sit to stand with holding hemi walker: 3x 3 steps mod to max A Standing with hemi walker with weight shifting to L side and holding for 5 sec: 10x                      PT Short Term Goals - 01/13/20 1836      PT SHORT TERM GOAL #1   Title Patient will be able to perform sit to stand with mod A with or without AD to improve independence and reduce care giver burden    Baseline max A (eval); mod to max A (10/30/19), mox to max A (12/01/19); mod A with 1 UE support (01/13/20)    Time 4    Period Weeks    Status Achieved    Target Date 11/27/19      PT SHORT TERM GOAL #2   Title pt will be able to stand with mod A for 5 min to perform small reaching with L UE to improve standing balance.    Baseline able to stand with holding on to parallel bars  with L UE but needs stabilization at knees to improve standing tolerance (max A required overall) is able to reach with max A, requires mod to max A but unable to hold for 5 min while performing reaching. Requires mod to max A due to inconsistency (01/13/20)    Time 4    Period Weeks    Status On-going    Target Date 02/10/20      PT SHORT TERM GOAL #3   Title Pt will be able to propel chair with SBA  for 30 feet to improve household navigation    Baseline Able to propel chair with use of L UE and L LE with mod A for 30 feet (10/30/19); patient has power chair now (12/01/19)    Time 4    Period Weeks    Status Not Met    Target Date 10/06/19             PT Long Term Goals - 01/13/20 1838      PT LONG TERM GOAL #1   Title Patient will be able to stand for 5 min with CGA to improve standing endurance with or without AD    Baseline requires max A    Time 8    Period Weeks    Status On-going    Target Date 03/09/20      PT LONG TERM GOAL #2   Title Patient will be able to perform chair to bed transfer using hemi walker and mod A  to reduce caregiver burden    Baseline max A with stand pivot transfers    Time 8    Period Weeks    Status On-going    Target Date 03/09/20      PT LONG TERM GOAL #3   Title Pt will be able to ambulate 10 feet with hemi walker and mod to max A to improve in home mobility    Baseline total A with body weight support system    Time 8    Period Weeks    Status Revised      PT LONG TERM GOAL #4   Title Pt will demo >10/56 on BBS to improve static balance and reduce fall risk    Baseline BBS 4/56 (eval)    Time 8    Period Weeks    Status On-going    Target Date 03/09/20                 Plan - 01/15/20 1904    Clinical Impression Statement Pt demonstrated improved ability to shift weight onto his hemi walker with standing and 2-3 steps compared to previous session.    Personal Factors and Comorbidities Age;Comorbidity 3+    Comorbidities hx of stroke, hx of colorectal cancer    Examination-Activity Limitations Bathing;Bed Mobility;Caring for Others;Carry;Locomotion Level;Hygiene/Grooming;Dressing;Continence;Self Feeding;Reach Overhead;Sit;Lift;Other;Transfers;Toileting;Stand;Stairs    Rehab Potential Good    PT Frequency 2x / week    PT Duration 8 weeks    PT Treatment/Interventions ADLs/Self Care Home Management;Electrical Stimulation;Gait training;Stair training;Functional mobility training;Therapeutic exercise;Balance training;Neuromuscular re-education;Therapeutic activities;Patient/family education;Wheelchair mobility training;Manual techniques;Passive range of motion    PT Next Visit Plan recertification next session    Consulted and Agree with Plan of Care Patient;Family member/caregiver    Family Member Consulted daughter           Patient will benefit from skilled therapeutic intervention in order to improve the following deficits and impairments:  Abnormal gait, Decreased activity tolerance, Decreased coordination, Decreased safety awareness, Decreased strength,  Impaired flexibility, Impaired UE functional use, Postural dysfunction, Impaired tone, Increased edema, Decreased range of motion, Decreased knowledge of precautions, Impaired sensation, Difficulty walking, Decreased mobility, Decreased endurance, Decreased balance  Visit Diagnosis: Hemiplegia and hemiparesis following cerebral infarction affecting right dominant side (HCC)  Unsteadiness on feet  Muscle weakness (generalized)  Neurologic neglect syndrome     Problem List Patient Active Problem List   Diagnosis Date Noted  . Recurrent UTI 01/09/2020  . History of basal cell cancer 01/09/2020  . Chronic respiratory failure with hypoxia ([place]) 11/07/2019  . Pressure injury of skin 11/05/2019  . Acute renal failure superimposed on stage 4 chronic kidney disease ([place]) 11/04/2019  . Right hemiparesis ([place]) 11/04/2019  . Atrial fibrillation, chronic ([place]) 11/04/2019  . Dysphagia 11/04/2019  . 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 CKD (chronic kidney disease) stage 4, GFR 15-29 ml/min (HCC) 09/29/2019  . Thrombocytopenia ([place]) 09/29/2019  . Personal history of DVT (deep vein thrombosis) 09/09/2019  . History of ischemic stroke 09/09/2019  . CAD (coronary artery disease) 09/09/2019  . Chronic constipation 09/09/2019  . History of colorectal cancer 09/09/2019  . 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 Right arm weakness 09/09/2019  . Right leg weakness 09/09/2019  . Pseudobulbar affect 09/09/2019    Kerrie Pleasure 01/15/2020, 7:08 PM  [facility] [address] [place], [place], [address] Phone: [phone]   Fax:  [phone]  Name: Robert Mcconnell MRN: [id number] Date of Birth: 04-18-1922

## 2020-01-16 ENCOUNTER — Telehealth: Payer: Self-pay | Admitting: *Deleted

## 2020-01-16 NOTE — Telephone Encounter (Signed)
Spoke w/Jessi with [employer] and she will fax over a form to be completed to help get the assistance that he is needing to obtain this medication.

## 2020-01-20 ENCOUNTER — Ambulatory Visit: Payer: No Typology Code available for payment source

## 2020-01-20 ENCOUNTER — Encounter: Payer: Self-pay | Admitting: Occupational Therapy

## 2020-01-20 ENCOUNTER — Other Ambulatory Visit: Payer: Self-pay

## 2020-01-20 ENCOUNTER — Ambulatory Visit: Payer: No Typology Code available for payment source | Admitting: Occupational Therapy

## 2020-01-20 DIAGNOSIS — R414 Neurologic neglect syndrome: Secondary | ICD-10-CM

## 2020-01-20 DIAGNOSIS — M6281 Muscle weakness (generalized): Secondary | ICD-10-CM

## 2020-01-20 DIAGNOSIS — R600 Localized edema: Secondary | ICD-10-CM

## 2020-01-20 DIAGNOSIS — R2681 Unsteadiness on feet: Secondary | ICD-10-CM

## 2020-01-20 DIAGNOSIS — M25611 Stiffness of right shoulder, not elsewhere classified: Secondary | ICD-10-CM

## 2020-01-20 DIAGNOSIS — R41840 Attention and concentration deficit: Secondary | ICD-10-CM

## 2020-01-20 DIAGNOSIS — I69351 Hemiplegia and hemiparesis following cerebral infarction affecting right dominant side: Secondary | ICD-10-CM

## 2020-01-20 NOTE — Therapy (Signed)
[facility] [address] [place], [place], [address] Phone: [phone]   Fax:  [phone]  Physical Therapy Treatment  Patient Details  Name: Robert Mcconnell MRN: [id number] Date of Birth: February 18, 1922 Referring Provider (PT): Arnell Sieving   Encounter Date: 01/20/2020   PT End of Session - 01/20/20 1912    Visit Number 33    Number of Visits 32    Date for PT Re-Evaluation 12/25/19    Authorization Type VA, eval 0/9/23, recert 3/0/07    Authorization Time Period 15 visits from 7/8-11/5    Authorization - Visit Number 16    Authorization - Number of Visits 15    Progress Note Due on Visit 15    PT Start Time 1615    PT Stop Time 1700    PT Time Calculation (min) 45 min    Equipment Utilized During Treatment Gait belt    Activity Tolerance Patient tolerated treatment well    Behavior During Therapy [facility] for tasks assessed/performed           Past Medical History:  Diagnosis Date  . Atrial fibrillation ([place])   . BPH (benign prostatic hyperplasia)   . CKD (chronic kidney disease)   . History of coronary angioplasty with insertion of stent   . Hypertension   . Severe sepsis ([place]) 11/04/2019  . Stroke [facility])     Past Surgical History:  Procedure Laterality Date  . COLECTOMY  2006   cancer    There were no vitals filed for this visit.   Subjective Assessment - 01/20/20 1910    Subjective Daughter reports this past Sunday, he did not do too good  at home with transfers and standing.    Patient is accompained by: Family member    Pertinent History CVA    Limitations Standing;Walking;House hold activities;Sitting              Gait training> max A x 2 with hemi walker, tactile and verbal cues to prevent excessive lean to R, move R foot forward, sequencing of cane/foot, weight shifting: 3 steps, 6  Steps, 2 steps  Sit to stand from power chair: 3x with mod to max A and using hemi walker to  stabilize  Sit to stand from standard chair + 2 airex: 5x mod to max A and using hemi wlaker to stabilize  Pt needs max cueing for hand placement when standing up and requires tactile and verbal cueing to shift weight to Left side when performing trnasfers  Standing balance: with hemi walker: 1', moderate to max A require and verbal and tactile cueing to shift weigtht to Left side and push down on walker. Pt needs cueing not to take step or to move hemi walker  Seated leaning forward and laterally:: 15x in multiple directions  Seated passive and active lateral trunk lean to L: tactile cues to shift weight and facilitate abdominal contracction  Seated toe touches: 10x L only                        PT Short Term Goals - 01/13/20 1836      PT SHORT TERM GOAL #1   Title Patient will be able to perform sit to stand with mod A with or without AD to improve independence and reduce care giver burden    Baseline max A (eval); mod to max A (10/30/19), mox to max A (12/01/19); mod A with 1 UE support (01/13/20)  Time 4    Period Weeks    Status Achieved    Target Date 11/27/19      PT SHORT TERM GOAL #2   Title pt will be able to stand with mod A for 5 min to perform small reaching with L UE to improve standing balance.    Baseline able to stand with holding on to parallel bars with L UE but needs stabilization at knees to improve standing tolerance (max A required overall) is able to reach with max A, requires mod to max A but unable to hold for 5 min while performing reaching. Requires mod to max A due to inconsistency (01/13/20)    Time 4    Period Weeks    Status On-going    Target Date 02/10/20      PT SHORT TERM GOAL #3   Title Pt will be able to propel chair with SBA  for 30 feet to improve household navigation    Baseline Able to propel chair with use of L UE and L LE with mod A for 30 feet (10/30/19); patient has power chair now (12/01/19)    Time 4    Period Weeks     Status Not Met    Target Date 10/06/19             PT Long Term Goals - 01/13/20 1838      PT LONG TERM GOAL #1   Title Patient will be able to stand for 5 min with CGA to improve standing endurance with or without AD    Baseline requires max A    Time 8    Period Weeks    Status On-going    Target Date 03/09/20      PT LONG TERM GOAL #2   Title Patient will be able to perform chair to bed transfer using hemi walker and mod A to reduce caregiver burden    Baseline max A with stand pivot transfers    Time 8    Period Weeks    Status On-going    Target Date 03/09/20      PT LONG TERM GOAL #3   Title Pt will be able to ambulate 10 feet with hemi walker and mod to max A to improve in home mobility    Baseline total A with body weight support system    Time 8    Period Weeks    Status Revised      PT LONG TERM GOAL #4   Title Pt will demo >10/56 on BBS to improve static balance and reduce fall risk    Baseline BBS 4/56 (eval)    Time 8    Period Weeks    Status On-going    Target Date 03/09/20                 Plan - 01/20/20 1911    Clinical Impression Statement Patient continues to demonstrate difficulty with shifting body weight to his left side when standing or walking which causes significant lean towards the affected, weaker side and requires mod to max A. Pt able to advane 6 steps with hemi walker with max A to prevent excessive lean to R side and to advance R LE>    Personal Factors and Comorbidities Age;Comorbidity 3+    Comorbidities hx of stroke, hx of colorectal cancer    Examination-Activity Limitations Bathing;Bed Mobility;Caring for Others;Carry;Locomotion Level;Hygiene/Grooming;Dressing;Continence;Self Feeding;Reach Overhead;Sit;Lift;Other;Transfers;Toileting;Stand;Stairs    Rehab Potential Good    PT Frequency  2x / week    PT Duration 8 weeks    PT Treatment/Interventions ADLs/Self Care Home Management;Electrical Stimulation;Gait training;Stair  training;Functional mobility training;Therapeutic exercise;Balance training;Neuromuscular re-education;Therapeutic activities;Patient/family education;Wheelchair mobility training;Manual techniques;Passive range of motion    PT Next Visit Plan recertification next session    Consulted and Agree with Plan of Care Patient;Family member/caregiver    Family Member Consulted daughter           Patient will benefit from skilled therapeutic intervention in order to improve the following deficits and impairments:  Abnormal gait, Decreased activity tolerance, Decreased coordination, Decreased safety awareness, Decreased strength, Impaired flexibility, Impaired UE functional use, Postural dysfunction, Impaired tone, Increased edema, Decreased range of motion, Decreased knowledge of precautions, Impaired sensation, Difficulty walking, Decreased mobility, Decreased endurance, Decreased balance  Visit Diagnosis: Hemiplegia and hemiparesis following cerebral infarction affecting right dominant side (HCC)  Unsteadiness on feet  Muscle weakness (generalized)  Neurologic neglect syndrome     Problem List Patient Active Problem List   Diagnosis Date Noted  . Recurrent UTI 01/09/2020  . History of basal cell cancer 01/09/2020  . Chronic respiratory failure with hypoxia ([place]) 11/07/2019  . Pressure injury of skin 11/05/2019  . Acute renal failure superimposed on stage 4 chronic kidney disease ([place]) 11/04/2019  . Right hemiparesis ([place]) 11/04/2019  . Atrial fibrillation, chronic ([place]) 11/04/2019  . Dysphagia 11/04/2019  . 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 CKD (chronic kidney disease) stage 4, GFR 15-29 ml/min (HCC) 09/29/2019  . Thrombocytopenia ([place]) 09/29/2019  . Personal history of DVT (deep vein thrombosis) 09/09/2019  . History of ischemic stroke 09/09/2019  . CAD (coronary artery disease) 09/09/2019  . Chronic constipation 09/09/2019  . History of  colorectal cancer 09/09/2019  . 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 Right arm weakness 09/09/2019  . Right leg weakness 09/09/2019  . Pseudobulbar affect 09/09/2019    Kerrie Pleasure 01/20/2020, 7:18 PM  [facility] [address] [place], [place], [address] Phone: [phone]   Fax:  [phone]  Name: Robert Mcconnell MRN: [id number] Date of Birth: [birth year]

## 2020-01-20 NOTE — Therapy (Signed)
[facility] [address] [place], [place], [address] Phone: [phone]   Fax:  [phone]  Occupational Therapy Treatment  Patient Details  Name: Robert Mcconnell MRN: [id number] Date of Birth: 31-Oct-1921 Referring Provider (OT): Eliezer Lofts   Encounter Date: 01/20/2020   OT End of Session - 01/20/20 1849    Visit Number 23    Number of Visits 26    Date for OT Re-Evaluation 01/30/20    Authorization Type VA    Authorization - Visit Number 23    Authorization - Number of Visits 30    Progress Note Due on Visit 30    OT Start Time 1703    OT Stop Time 1745    OT Time Calculation (min) 42 min    Activity Tolerance Patient tolerated treatment well    Behavior During Therapy [facility] for tasks assessed/performed           Past Medical History:  Diagnosis Date  . Atrial fibrillation ([place])   . BPH (benign prostatic hyperplasia)   . CKD (chronic kidney disease)   . History of coronary angioplasty with insertion of stent   . Hypertension   . Severe sepsis ([place]) 11/04/2019  . Stroke [facility])     Past Surgical History:  Procedure Laterality Date  . COLECTOMY  2006   cancer    There were no vitals filed for this visit.   Subjective Assessment - 01/20/20 1845    Subjective  Same    Patient is accompanied by: Family member    Currently in Pain? No/denies    Pain Score 0-No pain                        OT Treatments/Exercises (OP) - 01/20/20 0001      ADLs   Toileting Sit to stand and stand balance using bar to pull to stand.  Used bar facing patient as at home with bedrail.  Patient able to pull to stand, but today when attempting to use left hand, losing balance, falling into flexed posture.  Used overt cues initially about standing first then achieving baalnce before attemoting to participate in activity, Working toward increased independence with standing to allow toileting in bathroom or on bedside  commode.                      OT Short Term Goals - 01/20/20 1850      OT SHORT TERM GOAL #1   Title Patient will complete lower body bathing with max assist due 6/19      OT SHORT TERM GOAL #2   Title Patient will bend forward while seated to pull pants up thighs    Status Achieved      OT SHORT TERM GOAL #3   Title Patient will transition from sit to stand with mod assist in prep for tolieting on commode    Status Achieved      OT SHORT TERM GOAL #4   Title Patient/caregiver will don/doff custom RUE splint with min cueing    Status Deferred             OT Long Term Goals - 01/20/20 1850      OT LONG TERM GOAL #1   Title Patient will transiton from sit to stand with mod assist to allow toileting, clothing management, and hygiene    Time 6    Period Weeks    Status Achieved  OT LONG TERM GOAL #2   Title Patient/caregiver will complete a home exercise program to prevent pain, and improve passive motion in RUE to aide with hygiene and dressing tasks    Baseline Patient with pain throughout RUE    Time 6    Period Weeks    Status Achieved      OT LONG TERM GOAL #3   Title Patient / caregiver will demonstrate effective edema management techniques for right hand, and support for right arm to prevent further subluxation    Time 6    Period Weeks    Status Achieved      OT LONG TERM GOAL #4   Title Patient will attend to right arm, and manage right arm during transitional movements and in wheelchair with min cueing    Time 6    Period Weeks    Status On-going      OT LONG TERM GOAL #5   Title Patient will stand with min assist for sufficient time that caregiver can complete toilet hygiene and clothing management due 9/24    Status On-going      OT LONG TERM GOAL #6   Title Patient will complete shower transfer into and out of tub with moderate assistance    Time 8    Period Weeks    Status On-going                 Plan - 01/20/20 1849     Clinical Impression Statement Patient showing consistent ability to pull to stand which will hopefully translate to improved functional transfers to shower and toilet.    OT Occupational Profile and History Detailed Assessment- Review of Records and additional review of physical, cognitive, psychosocial history related to current functional performance    Occupational performance deficits (Please refer to evaluation for details): ADL's;Rest and Sleep    Body Structure / Function / Physical Skills ADL;Coordination;Endurance;GMC;UE functional use;Balance;Decreased knowledge of precautions;Sensation;Pain;Flexibility;Body mechanics;Decreased knowledge of use of DME;FMC;Proprioception;Strength;Tone;ROM;Edema;Continence;Mobility    Cognitive Skills Attention;Emotional;Energy/Drive;Memory;Orientation;Perception;Problem Solve;Safety Awareness;Sequencing    Rehab Potential Fair    Clinical Decision Making Several treatment options, min-mod task modification necessary    Comorbidities Affecting Occupational Performance: May have comorbidities impacting occupational performance    Modification or Assistance to Complete Evaluation  Min-Moderate modification of tasks or assist with assess necessary to complete eval    OT Frequency 2x / week    OT Duration 6 weeks    OT Treatment/Interventions Self-care/ADL training;Electrical Stimulation;Therapeutic exercise;Visual/perceptual remediation/compensation;Patient/family education;Splinting;Neuromuscular education;Therapeutic activities;Balance training;Cognitive remediation/compensation;Passive range of motion;Manual Therapy;DME and/or AE instruction    Plan Postural control, sitting balance, sit to lift off, weight shift right, lower body ADL weight shifts, NMR RUE    OT Home Exercise Plan Sliding right hand on thigh, tilted stool    Consulted and Agree with Plan of Care Patient;Family member/caregiver    Family Member Consulted Daughter [name]           Patient  will benefit from skilled therapeutic intervention in order to improve the following deficits and impairments:   Body Structure / Function / Physical Skills: ADL, Coordination, Endurance, GMC, UE functional use, Balance, Decreased knowledge of precautions, Sensation, Pain, Flexibility, Body mechanics, Decreased knowledge of use of DME, FMC, Proprioception, Strength, Tone, ROM, Edema, Continence, Mobility Cognitive Skills: Attention, Emotional, Energy/Drive, Memory, Orientation, Perception, Problem Solve, Safety Awareness, Sequencing     Visit Diagnosis: Hemiplegia and hemiparesis following cerebral infarction affecting right dominant side (HCC)  Unsteadiness on feet  Muscle weakness (generalized)  Neurologic neglect syndrome  Attention and concentration deficit  Stiffness of right shoulder, not elsewhere classified  Localized edema    Problem List Patient Active Problem List   Diagnosis Date Noted  . Recurrent UTI 01/09/2020  . History of basal cell cancer 01/09/2020  . Chronic respiratory failure with hypoxia ([place]) 11/07/2019  . Pressure injury of skin 11/05/2019  . Acute renal failure superimposed on stage 4 chronic kidney disease ([place]) 11/04/2019  . Right hemiparesis ([place]) 11/04/2019  . Atrial fibrillation, chronic ([place]) 11/04/2019  . Dysphagia 11/04/2019  . 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 CKD (chronic kidney disease) stage 4, GFR 15-29 ml/min (HCC) 09/29/2019  . Thrombocytopenia ([place]) 09/29/2019  . Personal history of DVT (deep vein thrombosis) 09/09/2019  . History of ischemic stroke 09/09/2019  . CAD (coronary artery disease) 09/09/2019  . Chronic constipation 09/09/2019  . History of colorectal cancer 09/09/2019  . 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 Right arm weakness 09/09/2019  . Right leg weakness 09/09/2019  . Pseudobulbar affect 09/09/2019    Mariah Milling, OTR/L 01/20/2020, 6:51 PM  [facility] [address] [place], [place], [address] Phone: [phone]   Fax:  [phone]  Name: Robert Mcconnell MRN: [id number] Date of Birth: May 11, 1921

## 2020-01-22 ENCOUNTER — Other Ambulatory Visit: Payer: Self-pay

## 2020-01-22 ENCOUNTER — Ambulatory Visit: Payer: No Typology Code available for payment source

## 2020-01-22 ENCOUNTER — Encounter: Payer: Self-pay | Admitting: Occupational Therapy

## 2020-01-22 ENCOUNTER — Ambulatory Visit: Payer: No Typology Code available for payment source | Admitting: Occupational Therapy

## 2020-01-22 DIAGNOSIS — R414 Neurologic neglect syndrome: Secondary | ICD-10-CM

## 2020-01-22 DIAGNOSIS — R600 Localized edema: Secondary | ICD-10-CM

## 2020-01-22 DIAGNOSIS — R2681 Unsteadiness on feet: Secondary | ICD-10-CM

## 2020-01-22 DIAGNOSIS — I69351 Hemiplegia and hemiparesis following cerebral infarction affecting right dominant side: Secondary | ICD-10-CM | POA: Diagnosis not present

## 2020-01-22 DIAGNOSIS — M25611 Stiffness of right shoulder, not elsewhere classified: Secondary | ICD-10-CM

## 2020-01-22 DIAGNOSIS — M6281 Muscle weakness (generalized): Secondary | ICD-10-CM

## 2020-01-22 DIAGNOSIS — R41840 Attention and concentration deficit: Secondary | ICD-10-CM

## 2020-01-22 MED FILL — TAMSULOSIN HCL 0.4 MG CAP: 0.4 | 90 days supply | Qty: 90 | Fill #0

## 2020-01-22 NOTE — Therapy (Signed)
[facility] [address] [place], [place], [address] Phone: [phone]   Fax:  [phone]  Physical Therapy Treatment  Patient Details  Name: Robert Mcconnell MRN: [id number] Date of Birth: Sep 12, 1921 Referring Provider (PT): Arnell Sieving   Encounter Date: 01/22/2020   PT End of Session - 01/22/20 1844    Visit Number 34    Number of Visits 32    Date for PT Re-Evaluation 12/25/19    Authorization Type VA, eval 08/14/68, recert 06/14/35    Authorization Time Period 15 visits from 7/8-11/5    Authorization - Visit Number 17    Authorization - Number of Visits 15    Progress Note Due on Visit 15    PT Start Time 1615    PT Stop Time 1700    PT Time Calculation (min) 45 min    Equipment Utilized During Treatment Gait belt    Activity Tolerance Patient tolerated treatment well    Behavior During Therapy [facility] for tasks assessed/performed           Past Medical History:  Diagnosis Date  . Atrial fibrillation ([place])   . BPH (benign prostatic hyperplasia)   . CKD (chronic kidney disease)   . History of coronary angioplasty with insertion of stent   . Hypertension   . Severe sepsis ([place]) 11/04/2019  . Stroke [facility])     Past Surgical History:  Procedure Laterality Date  . COLECTOMY  2006   cancer    There were no vitals filed for this visit.   Subjective Assessment - 01/22/20 1631    Subjective Daughter reports they have been practicing reaching down and to the left side.    Patient is accompained by: Family member    Pertinent History CVA    Limitations Standing;Walking;House hold activities;Sitting              Gait training: 3 x 10 ffeet with hemi walker and max A to advance R LE and To stabilize trunk  Sit to stand: mod to max A  Seated left lateral trunk flexion with Car jack: with 10lbs 20x Seated A/AAROM of R hip into abduction and adduction: 30x Seated AAROM of LAQ:  20x                         PT Short Term Goals - 01/13/20 1836      PT SHORT TERM GOAL #1   Title Patient will be able to perform sit to stand with mod A with or without AD to improve independence and reduce care giver burden    Baseline max A (eval); mod to max A (10/30/19), mox to max A (12/01/19); mod A with 1 UE support (01/13/20)    Time 4    Period Weeks    Status Achieved    Target Date 11/27/19      PT SHORT TERM GOAL #2   Title pt will be able to stand with mod A for 5 min to perform small reaching with L UE to improve standing balance.    Baseline able to stand with holding on to parallel bars with L UE but needs stabilization at knees to improve standing tolerance (max A required overall) is able to reach with max A, requires mod to max A but unable to hold for 5 min while performing reaching. Requires mod to max A due to inconsistency (01/13/20)    Time 4    Period  Weeks    Status On-going    Target Date 02/10/20      PT SHORT TERM GOAL #3   Title Pt will be able to propel chair with SBA  for 30 feet to improve household navigation    Baseline Able to propel chair with use of L UE and L LE with mod A for 30 feet (10/30/19); patient has power chair now (12/01/19)    Time 4    Period Weeks    Status Not Met    Target Date 10/06/19             PT Long Term Goals - 01/13/20 1838      PT LONG TERM GOAL #1   Title Patient will be able to stand for 5 min with CGA to improve standing endurance with or without AD    Baseline requires max A    Time 8    Period Weeks    Status On-going    Target Date 03/09/20      PT LONG TERM GOAL #2   Title Patient will be able to perform chair to bed transfer using hemi walker and mod A to reduce caregiver burden    Baseline max A with stand pivot transfers    Time 8    Period Weeks    Status On-going    Target Date 03/09/20      PT LONG TERM GOAL #3   Title Pt will be able to ambulate 10 feet with hemi walker  and mod to max A to improve in home mobility    Baseline total A with body weight support system    Time 8    Period Weeks    Status Revised      PT LONG TERM GOAL #4   Title Pt will demo >10/56 on BBS to improve static balance and reduce fall risk    Baseline BBS 4/56 (eval)    Time 8    Period Weeks    Status On-going    Target Date 03/09/20                 Plan - 01/22/20 1632    Clinical Impression Statement Patient demonstrated improved ability to push down on hemi walker with L UE and weight shift to L. Patient still required max A to advance R LE and due to buckling of R LE. Pt able to ambulate 30 feet total today. Proximal R hip control is still very poor.    Personal Factors and Comorbidities Age;Comorbidity 3+    Comorbidities hx of stroke, hx of colorectal cancer    Examination-Activity Limitations Bathing;Bed Mobility;Caring for Others;Carry;Locomotion Level;Hygiene/Grooming;Dressing;Continence;Self Feeding;Reach Overhead;Sit;Lift;Other;Transfers;Toileting;Stand;Stairs    Rehab Potential Good    PT Frequency 2x / week    PT Duration 8 weeks    PT Treatment/Interventions ADLs/Self Care Home Management;Electrical Stimulation;Gait training;Stair training;Functional mobility training;Therapeutic exercise;Balance training;Neuromuscular re-education;Therapeutic activities;Patient/family education;Wheelchair mobility training;Manual techniques;Passive range of motion    PT Next Visit Plan recertification next session    Consulted and Agree with Plan of Care Patient;Family member/caregiver    Family Member Consulted daughter           Patient will benefit from skilled therapeutic intervention in order to improve the following deficits and impairments:  Abnormal gait, Decreased activity tolerance, Decreased coordination, Decreased safety awareness, Decreased strength, Impaired flexibility, Impaired UE functional use, Postural dysfunction, Impaired tone, Increased edema,  Decreased range of motion, Decreased knowledge of precautions, Impaired sensation, Difficulty walking, Decreased  mobility, Decreased endurance, Decreased balance  Visit Diagnosis: Hemiplegia and hemiparesis following cerebral infarction affecting right dominant side (HCC)  Unsteadiness on feet  Muscle weakness (generalized)  Neurologic neglect syndrome     Problem List Patient Active Problem List   Diagnosis Date Noted  . Recurrent UTI 01/09/2020  . History of basal cell cancer 01/09/2020  . Chronic respiratory failure with hypoxia ([place]) 11/07/2019  . Pressure injury of skin 11/05/2019  . Acute renal failure superimposed on stage 4 chronic kidney disease ([place]) 11/04/2019  . Right hemiparesis ([place]) 11/04/2019  . Atrial fibrillation, chronic ([place]) 11/04/2019  . Dysphagia 11/04/2019  . 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 CKD (chronic kidney disease) stage 4, GFR 15-29 ml/min (HCC) 09/29/2019  . Thrombocytopenia ([place]) 09/29/2019  . Personal history of DVT (deep vein thrombosis) 09/09/2019  . History of ischemic stroke 09/09/2019  . CAD (coronary artery disease) 09/09/2019  . Chronic constipation 09/09/2019  . History of colorectal cancer 09/09/2019  . 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 Right arm weakness 09/09/2019  . Right leg weakness 09/09/2019  . Pseudobulbar affect 09/09/2019    Kerrie Pleasure 01/22/2020, 6:47 PM  [facility] [address] [place], [place], [address] Phone: [phone]   Fax:  [phone]  Name: Robert Mcconnell MRN: [id number] Date of Birth: August 27, 1921

## 2020-01-22 NOTE — Therapy (Signed)
[facility] [address] [place], [place], [address] Phone: [phone]   Fax:  [phone]  Occupational Therapy Treatment  Patient Details  Name: Robert Mcconnell MRN: [id number] Date of Birth: 07/26/1921 Referring Provider (OT): Eliezer Lofts   Encounter Date: 01/22/2020   OT End of Session - 01/22/20 1750    Visit Number 24    Number of Visits 26    Date for OT Re-Evaluation 01/30/20    Authorization Type VA    Authorization - Visit Number 24    Authorization - Number of Visits 30    Progress Note Due on Visit 30    OT Start Time 1700    OT Stop Time 1745    OT Time Calculation (min) 45 min    Activity Tolerance Patient tolerated treatment well    Behavior During Therapy [facility] for tasks assessed/performed           Past Medical History:  Diagnosis Date  . Atrial fibrillation ([place])   . BPH (benign prostatic hyperplasia)   . CKD (chronic kidney disease)   . History of coronary angioplasty with insertion of stent   . Hypertension   . Severe sepsis ([place]) 11/04/2019  . Stroke [facility])     Past Surgical History:  Procedure Laterality Date  . COLECTOMY  2006   cancer    There were no vitals filed for this visit.   Subjective Assessment - 01/22/20 1748    Subjective  I feel better    Patient is accompanied by: Family member    Currently in Pain? No/denies    Pain Score 0-No pain                        OT Treatments/Exercises (OP) - 01/22/20 0001      Neurological Re-education Exercises   Other Exercises 1 Neuromuscular reeducation to address postural control in sitting and standing.   Continuuing to work toward decrease assistance required for sit to stand and stand balance.  In standing worked on weight shifting left toward target.  Patient with improved trunk control in standing, allowing him to reach at chest height with left UE                    OT Short Term Goals -  01/20/20 1850      OT SHORT TERM GOAL #1   Title Patient will complete lower body bathing with max assist due 6/19      OT SHORT TERM GOAL #2   Title Patient will bend forward while seated to pull pants up thighs    Status Achieved      OT SHORT TERM GOAL #3   Title Patient will transition from sit to stand with mod assist in prep for tolieting on commode    Status Achieved      OT SHORT TERM GOAL #4   Title Patient/caregiver will don/doff custom RUE splint with min cueing    Status Deferred             OT Long Term Goals - 01/20/20 1850      OT LONG TERM GOAL #1   Title Patient will transiton from sit to stand with mod assist to allow toileting, clothing management, and hygiene    Time 6    Period Weeks    Status Achieved      OT LONG TERM GOAL #2   Title Patient/caregiver will complete a home  exercise program to prevent pain, and improve passive motion in RUE to aide with hygiene and dressing tasks    Baseline Patient with pain throughout RUE    Time 6    Period Weeks    Status Achieved      OT LONG TERM GOAL #3   Title Patient / caregiver will demonstrate effective edema management techniques for right hand, and support for right arm to prevent further subluxation    Time 6    Period Weeks    Status Achieved      OT LONG TERM GOAL #4   Title Patient will attend to right arm, and manage right arm during transitional movements and in wheelchair with min cueing    Time 6    Period Weeks    Status On-going      OT LONG TERM GOAL #5   Title Patient will stand with min assist for sufficient time that caregiver can complete toilet hygiene and clothing management due 9/24    Status On-going      OT LONG TERM GOAL #6   Title Patient will complete shower transfer into and out of tub with moderate assistance    Time 8    Period Weeks    Status On-going                 Plan - 01/22/20 1751    Clinical Impression Statement Patient showing consistent ability  to improvement in active standing which will translate to improved functional transfers to shower and toilet.    OT Occupational Profile and History Detailed Assessment- Review of Records and additional review of physical, cognitive, psychosocial history related to current functional performance    Occupational performance deficits (Please refer to evaluation for details): ADL's;Rest and Sleep    Body Structure / Function / Physical Skills ADL;Coordination;Endurance;GMC;UE functional use;Balance;Decreased knowledge of precautions;Sensation;Pain;Flexibility;Body mechanics;Decreased knowledge of use of DME;FMC;Proprioception;Strength;Tone;ROM;Edema;Continence;Mobility    Cognitive Skills Attention;Emotional;Energy/Drive;Memory;Orientation;Perception;Problem Solve;Safety Awareness;Sequencing    Rehab Potential Fair    Clinical Decision Making Several treatment options, min-mod task modification necessary    Comorbidities Affecting Occupational Performance: May have comorbidities impacting occupational performance    Modification or Assistance to Complete Evaluation  Min-Moderate modification of tasks or assist with assess necessary to complete eval    OT Frequency 2x / week    OT Duration 6 weeks    OT Treatment/Interventions Self-care/ADL training;Electrical Stimulation;Therapeutic exercise;Visual/perceptual remediation/compensation;Patient/family education;Splinting;Neuromuscular education;Therapeutic activities;Balance training;Cognitive remediation/compensation;Passive range of motion;Manual Therapy;DME and/or AE instruction    Plan Postural control, sitting balance, sit to lift off, weight shift right, lower body ADL weight shifts, NMR RUE    OT Home Exercise Plan Sliding right hand on thigh, tilted stool    Consulted and Agree with Plan of Care Patient;Family member/caregiver    Family Member Consulted Daughter [name]           Patient will benefit from skilled therapeutic intervention in  order to improve the following deficits and impairments:   Body Structure / Function / Physical Skills: ADL, Coordination, Endurance, GMC, UE functional use, Balance, Decreased knowledge of precautions, Sensation, Pain, Flexibility, Body mechanics, Decreased knowledge of use of DME, FMC, Proprioception, Strength, Tone, ROM, Edema, Continence, Mobility Cognitive Skills: Attention, Emotional, Energy/Drive, Memory, Orientation, Perception, Problem Solve, Safety Awareness, Sequencing     Visit Diagnosis: Hemiplegia and hemiparesis following cerebral infarction affecting right dominant side (HCC)  Unsteadiness on feet  Muscle weakness (generalized)  Neurologic neglect syndrome  Attention and concentration deficit  Stiffness of right  shoulder, not elsewhere classified  Localized edema    Problem List Patient Active Problem List   Diagnosis Date Noted  . Recurrent UTI 01/09/2020  . History of basal cell cancer 01/09/2020  . Chronic respiratory failure with hypoxia ([place]) 11/07/2019  . Pressure injury of skin 11/05/2019  . Acute renal failure superimposed on stage 4 chronic kidney disease ([place]) 11/04/2019  . Right hemiparesis ([place]) 11/04/2019  . Atrial fibrillation, chronic ([place]) 11/04/2019  . Dysphagia 11/04/2019  . 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 CKD (chronic kidney disease) stage 4, GFR 15-29 ml/min (HCC) 09/29/2019  . Thrombocytopenia ([place]) 09/29/2019  . Personal history of DVT (deep vein thrombosis) 09/09/2019  . History of ischemic stroke 09/09/2019  . CAD (coronary artery disease) 09/09/2019  . Chronic constipation 09/09/2019  . History of colorectal cancer 09/09/2019  . 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 Right arm weakness 09/09/2019  . Right leg weakness 09/09/2019  . Pseudobulbar affect 09/09/2019    Mariah Milling, OTR/L 01/22/2020, 5:52 PM  [facility] [address] [place], [place], [address] Phone: [phone]   Fax:  [phone]  Name: Robert Mcconnell MRN: [id number] Date of Birth: 1921-08-28

## 2020-01-26 MED FILL — CIPROFLOXACIN HCL 250 MG TA: 250 | 7 days supply | Qty: 15 | Fill #0

## 2020-01-27 ENCOUNTER — Ambulatory Visit: Payer: No Typology Code available for payment source

## 2020-01-27 ENCOUNTER — Other Ambulatory Visit: Payer: Self-pay

## 2020-01-27 DIAGNOSIS — R2681 Unsteadiness on feet: Secondary | ICD-10-CM

## 2020-01-27 DIAGNOSIS — I69351 Hemiplegia and hemiparesis following cerebral infarction affecting right dominant side: Secondary | ICD-10-CM

## 2020-01-27 DIAGNOSIS — M6281 Muscle weakness (generalized): Secondary | ICD-10-CM

## 2020-01-27 NOTE — Therapy (Signed)
[facility] [address] [place], [place], [address] Phone: [phone]   Fax:  [phone]  Physical Therapy Treatment  Patient Details  Name: Robert Mcconnell MRN: [id number] Date of Birth: 1921/10/08 Referring Provider (PT): Arnell Sieving   Encounter Date: 01/27/2020   PT End of Session - 01/27/20 1804    Visit Number 35    Number of Visits 32    Date for PT Re-Evaluation 12/25/19    Authorization Type VA, eval 10/10/01, recert 09/09/63    Authorization Time Period 15 visits from 7/8-11/5    Authorization - Visit Number 4    Authorization - Number of Visits 15    Progress Note Due on Visit 15    PT Start Time 1615    PT Stop Time 1700    PT Time Calculation (min) 45 min    Equipment Utilized During Treatment Gait belt    Activity Tolerance Patient tolerated treatment well    Behavior During Therapy [facility] for tasks assessed/performed           Past Medical History:  Diagnosis Date  . Atrial fibrillation ([place])   . BPH (benign prostatic hyperplasia)   . CKD (chronic kidney disease)   . History of coronary angioplasty with insertion of stent   . Hypertension   . Severe sepsis ([place]) 11/04/2019  . Stroke [facility])     Past Surgical History:  Procedure Laterality Date  . COLECTOMY  2006   cancer    There were no vitals filed for this visit.   Subjective Assessment - 01/27/20 1804    Subjective no new complaints    Patient is accompained by: Family member    Pertinent History CVA    Limitations Standing;Walking;House hold activities;Sitting                   Gait training: 15 ffeet, 15 feet, 20 feet with hemi walker and max A to advance R LE and To stabilize trunk  Sit to stand: mod to max A x 10 during the session  Standing punching punching bag with L UE: max A to maintain standing balance due to patient's excessive lean to R side and buckling of knees: 3 x 10 punches with rest break in  between  Side stepping at countertop: max A 3 steps each way                       PT Short Term Goals - 01/13/20 1836      PT SHORT TERM GOAL #1   Title Patient will be able to perform sit to stand with mod A with or without AD to improve independence and reduce care giver burden    Baseline max A (eval); mod to max A (10/30/19), mox to max A (12/01/19); mod A with 1 UE support (01/13/20)    Time 4    Period Weeks    Status Achieved    Target Date 11/27/19      PT SHORT TERM GOAL #2   Title pt will be able to stand with mod A for 5 min to perform small reaching with L UE to improve standing balance.    Baseline able to stand with holding on to parallel bars with L UE but needs stabilization at knees to improve standing tolerance (max A required overall) is able to reach with max A, requires mod to max A but unable to hold for 5 min while performing  reaching. Requires mod to max A due to inconsistency (01/13/20)    Time 4    Period Weeks    Status On-going    Target Date 02/10/20      PT SHORT TERM GOAL #3   Title Pt will be able to propel chair with SBA  for 30 feet to improve household navigation    Baseline Able to propel chair with use of L UE and L LE with mod A for 30 feet (10/30/19); patient has power chair now (12/01/19)    Time 4    Period Weeks    Status Not Met    Target Date 10/06/19             PT Long Term Goals - 01/13/20 1838      PT LONG TERM GOAL #1   Title Patient will be able to stand for 5 min with CGA to improve standing endurance with or without AD    Baseline requires max A    Time 8    Period Weeks    Status On-going    Target Date 03/09/20      PT LONG TERM GOAL #2   Title Patient will be able to perform chair to bed transfer using hemi walker and mod A to reduce caregiver burden    Baseline max A with stand pivot transfers    Time 8    Period Weeks    Status On-going    Target Date 03/09/20      PT LONG TERM GOAL #3   Title  Pt will be able to ambulate 10 feet with hemi walker and mod to max A to improve in home mobility    Baseline total A with body weight support system    Time 8    Period Weeks    Status Revised      PT LONG TERM GOAL #4   Title Pt will demo >10/56 on BBS to improve static balance and reduce fall risk    Baseline BBS 4/56 (eval)    Time 8    Period Weeks    Status On-going    Target Date 03/09/20                 Plan - 01/27/20 1804    Clinical Impression Statement PT still requires max A with ambulation but was able to ambulate longer distance compare to last session. Patient ambulated 15 feet, 15 feet and 20 feet. Patient unable to stand without max A without UE support.    Personal Factors and Comorbidities Age;Comorbidity 3+    Comorbidities hx of stroke, hx of colorectal cancer    Examination-Activity Limitations Bathing;Bed Mobility;Caring for Others;Carry;Locomotion Level;Hygiene/Grooming;Dressing;Continence;Self Feeding;Reach Overhead;Sit;Lift;Other;Transfers;Toileting;Stand;Stairs    Rehab Potential Good    PT Frequency 2x / week    PT Duration 8 weeks    PT Treatment/Interventions ADLs/Self Care Home Management;Electrical Stimulation;Gait training;Stair training;Functional mobility training;Therapeutic exercise;Balance training;Neuromuscular re-education;Therapeutic activities;Patient/family education;Wheelchair mobility training;Manual techniques;Passive range of motion    PT Next Visit Plan recertification next session    Consulted and Agree with Plan of Care Patient;Family member/caregiver    Family Member Consulted daughter           Patient will benefit from skilled therapeutic intervention in order to improve the following deficits and impairments:  Abnormal gait, Decreased activity tolerance, Decreased coordination, Decreased safety awareness, Decreased strength, Impaired flexibility, Impaired UE functional use, Postural dysfunction, Impaired tone, Increased  edema, Decreased range of motion, Decreased knowledge of  precautions, Impaired sensation, Difficulty walking, Decreased mobility, Decreased endurance, Decreased balance  Visit Diagnosis: Hemiplegia and hemiparesis following cerebral infarction affecting right dominant side (HCC)  Unsteadiness on feet  Muscle weakness (generalized)     Problem List Patient Active Problem List   Diagnosis Date Noted  . Recurrent UTI 01/09/2020  . History of basal cell cancer 01/09/2020  . Chronic respiratory failure with hypoxia ([place]) 11/07/2019  . Pressure injury of skin 11/05/2019  . Acute renal failure superimposed on stage 4 chronic kidney disease ([place]) 11/04/2019  . Right hemiparesis ([place]) 11/04/2019  . Atrial fibrillation, chronic ([place]) 11/04/2019  . Dysphagia 11/04/2019  . 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 CKD (chronic kidney disease) stage 4, GFR 15-29 ml/min (HCC) 09/29/2019  . Thrombocytopenia ([place]) 09/29/2019  . Personal history of DVT (deep vein thrombosis) 09/09/2019  . History of ischemic stroke 09/09/2019  . CAD (coronary artery disease) 09/09/2019  . Chronic constipation 09/09/2019  . History of colorectal cancer 09/09/2019  . 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 Right arm weakness 09/09/2019  . Right leg weakness 09/09/2019  . Pseudobulbar affect 09/09/2019    Kerrie Pleasure, PT 01/27/2020, 6:05 PM  [facility] [address] [place], [place], [address] Phone: [phone]   Fax:  [phone]  Name: Robert Mcconnell MRN: [id number] Date of Birth: 07-Mar-1922

## 2020-02-03 ENCOUNTER — Other Ambulatory Visit: Payer: Self-pay

## 2020-02-03 ENCOUNTER — Ambulatory Visit: Payer: No Typology Code available for payment source

## 2020-02-03 DIAGNOSIS — R414 Neurologic neglect syndrome: Secondary | ICD-10-CM

## 2020-02-03 DIAGNOSIS — M6281 Muscle weakness (generalized): Secondary | ICD-10-CM

## 2020-02-03 DIAGNOSIS — I69351 Hemiplegia and hemiparesis following cerebral infarction affecting right dominant side: Secondary | ICD-10-CM

## 2020-02-03 DIAGNOSIS — R2681 Unsteadiness on feet: Secondary | ICD-10-CM

## 2020-02-03 NOTE — Therapy (Signed)
[facility] [address] [place], [place], [address] Phone: [phone]   Fax:  [phone]  Physical Therapy Treatment  Patient Details  Name: Robert Mcconnell MRN: [id number] Date of Birth: 29-Mar-1922 Referring Provider (PT): Arnell Sieving   Encounter Date: 02/03/2020   PT End of Session - 02/03/20 1901    Visit Number 36    Number of Visits 32    Date for PT Re-Evaluation 12/25/19    Authorization Type VA, eval 4/0/81, recert 08/10/79    Authorization Time Period 15 visits from 7/8-11/5    Authorization - Visit Number 54    Authorization - Number of Visits 15    Progress Note Due on Visit 15    PT Start Time 1615    PT Stop Time 1700    PT Time Calculation (min) 45 min    Equipment Utilized During Treatment Gait belt    Activity Tolerance Patient tolerated treatment well    Behavior During Therapy [facility] for tasks assessed/performed           Past Medical History:  Diagnosis Date  . Atrial fibrillation ([place])   . BPH (benign prostatic hyperplasia)   . CKD (chronic kidney disease)   . History of coronary angioplasty with insertion of stent   . Hypertension   . Severe sepsis ([place]) 11/04/2019  . Stroke [facility])     Past Surgical History:  Procedure Laterality Date  . COLECTOMY  2006   cancer    There were no vitals filed for this visit.   Subjective Assessment - 02/03/20 1856    Subjective no new complaints    Patient is accompained by: Family member    Pertinent History CVA    Limitations Standing;Walking;House hold activities;Sitting    Currently in Pain? No/denies                  Gait training: 25 ffeet, 50 feet, 60 feet with hemi walker and max A to advance R LE and To stabilize trunk  Sit to stand: mod to max A x 7 during the session                         PT Short Term Goals - 01/13/20 1836      PT SHORT TERM GOAL #1   Title Patient will be able to  perform sit to stand with mod A with or without AD to improve independence and reduce care giver burden    Baseline max A (eval); mod to max A (10/30/19), mox to max A (12/01/19); mod A with 1 UE support (01/13/20)    Time 4    Period Weeks    Status Achieved    Target Date 11/27/19      PT SHORT TERM GOAL #2   Title pt will be able to stand with mod A for 5 min to perform small reaching with L UE to improve standing balance.    Baseline able to stand with holding on to parallel bars with L UE but needs stabilization at knees to improve standing tolerance (max A required overall) is able to reach with max A, requires mod to max A but unable to hold for 5 min while performing reaching. Requires mod to max A due to inconsistency (01/13/20)    Time 4    Period Weeks    Status On-going    Target Date 02/10/20      PT SHORT  TERM GOAL #3   Title Pt will be able to propel chair with SBA  for 30 feet to improve household navigation    Baseline Able to propel chair with use of L UE and L LE with mod A for 30 feet (10/30/19); patient has power chair now (12/01/19)    Time 4    Period Weeks    Status Not Met    Target Date 10/06/19             PT Long Term Goals - 01/13/20 1838      PT LONG TERM GOAL #1   Title Patient will be able to stand for 5 min with CGA to improve standing endurance with or without AD    Baseline requires max A    Time 8    Period Weeks    Status On-going    Target Date 03/09/20      PT LONG TERM GOAL #2   Title Patient will be able to perform chair to bed transfer using hemi walker and mod A to reduce caregiver burden    Baseline max A with stand pivot transfers    Time 8    Period Weeks    Status On-going    Target Date 03/09/20      PT LONG TERM GOAL #3   Title Pt will be able to ambulate 10 feet with hemi walker and mod to max A to improve in home mobility    Baseline total A with body weight support system    Time 8    Period Weeks    Status Revised       PT LONG TERM GOAL #4   Title Pt will demo >10/56 on BBS to improve static balance and reduce fall risk    Baseline BBS 4/56 (eval)    Time 8    Period Weeks    Status On-going    Target Date 03/09/20                 Plan - 02/03/20 1857    Clinical Impression Statement Pt demonstrated improved walking endurance today. He was able to ambulate 60 feet with max A most without rest. He ambulated total of 135 feet with hemi walker and max A. Patient is able to consistently use hemi walker to stabilize and advance it forward with cueing needed to not put it too far forward in front him. Patient requires max A due to excessive lean to his invovled R side. Patient is overall progressing.    Personal Factors and Comorbidities Age;Comorbidity 3+    Comorbidities hx of stroke, hx of colorectal cancer    Examination-Activity Limitations Bathing;Bed Mobility;Caring for Others;Carry;Locomotion Level;Hygiene/Grooming;Dressing;Continence;Self Feeding;Reach Overhead;Sit;Lift;Other;Transfers;Toileting;Stand;Stairs    Rehab Potential Good    PT Frequency 2x / week    PT Duration 8 weeks    PT Treatment/Interventions ADLs/Self Care Home Management;Electrical Stimulation;Gait training;Stair training;Functional mobility training;Therapeutic exercise;Balance training;Neuromuscular re-education;Therapeutic activities;Patient/family education;Wheelchair mobility training;Manual techniques;Passive range of motion    PT Next Visit Plan recertification next session    Consulted and Agree with Plan of Care Patient;Family member/caregiver    Family Member Consulted daughter           Patient will benefit from skilled therapeutic intervention in order to improve the following deficits and impairments:  Abnormal gait, Decreased activity tolerance, Decreased coordination, Decreased safety awareness, Decreased strength, Impaired flexibility, Impaired UE functional use, Postural dysfunction, Impaired tone, Increased  edema, Decreased range of motion, Decreased knowledge of  precautions, Impaired sensation, Difficulty walking, Decreased mobility, Decreased endurance, Decreased balance  Visit Diagnosis: Hemiplegia and hemiparesis following cerebral infarction affecting right dominant side (HCC)  Unsteadiness on feet  Muscle weakness (generalized)  Neurologic neglect syndrome     Problem List Patient Active Problem List   Diagnosis Date Noted  . Recurrent UTI 01/09/2020  . History of basal cell cancer 01/09/2020  . Chronic respiratory failure with hypoxia ([place]) 11/07/2019  . Pressure injury of skin 11/05/2019  . Acute renal failure superimposed on stage 4 chronic kidney disease ([place]) 11/04/2019  . Right hemiparesis ([place]) 11/04/2019  . Atrial fibrillation, chronic ([place]) 11/04/2019  . Dysphagia 11/04/2019  . 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 CKD (chronic kidney disease) stage 4, GFR 15-29 ml/min (HCC) 09/29/2019  . Thrombocytopenia ([place]) 09/29/2019  . Personal history of DVT (deep vein thrombosis) 09/09/2019  . History of ischemic stroke 09/09/2019  . CAD (coronary artery disease) 09/09/2019  . Chronic constipation 09/09/2019  . History of colorectal cancer 09/09/2019  . 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 Right arm weakness 09/09/2019  . Right leg weakness 09/09/2019  . Pseudobulbar affect 09/09/2019    Kerrie Pleasure, PT 02/03/2020, 7:02 PM  [facility] [address] [place], [place], [address] Phone: [phone]   Fax:  [phone]  Name: Robert Mcconnell MRN: [id number] Date of Birth: 1921/06/29

## 2020-02-05 ENCOUNTER — Ambulatory Visit: Payer: No Typology Code available for payment source | Admitting: Occupational Therapy

## 2020-02-05 ENCOUNTER — Encounter: Payer: Self-pay | Admitting: Occupational Therapy

## 2020-02-05 ENCOUNTER — Other Ambulatory Visit: Payer: Self-pay

## 2020-02-05 DIAGNOSIS — M6281 Muscle weakness (generalized): Secondary | ICD-10-CM

## 2020-02-05 DIAGNOSIS — R600 Localized edema: Secondary | ICD-10-CM

## 2020-02-05 DIAGNOSIS — R414 Neurologic neglect syndrome: Secondary | ICD-10-CM

## 2020-02-05 DIAGNOSIS — R2681 Unsteadiness on feet: Secondary | ICD-10-CM

## 2020-02-05 DIAGNOSIS — R41840 Attention and concentration deficit: Secondary | ICD-10-CM

## 2020-02-05 DIAGNOSIS — I69351 Hemiplegia and hemiparesis following cerebral infarction affecting right dominant side: Secondary | ICD-10-CM

## 2020-02-05 DIAGNOSIS — M25611 Stiffness of right shoulder, not elsewhere classified: Secondary | ICD-10-CM

## 2020-02-05 NOTE — Therapy (Signed)
[facility] [address] [place], [place], [address] Phone: [phone]   Fax:  [phone]  Occupational Therapy Treatment  Patient Details  Name: Robert Mcconnell MRN: [id number] Date of Birth: 1922-04-22 Referring Provider (OT): Robert Mcconnell   Encounter Date: 02/05/2020   OT End of Session - 02/05/20 1906    Visit Number 25    Number of Visits 26    Authorization Type VA    Authorization - Visit Number 25    Authorization - Number of Visits 30    Progress Note Due on Visit 23    OT Start Time 1705    OT Stop Time 1745    OT Time Calculation (min) 40 min    Activity Tolerance Patient tolerated treatment well           Past Medical History:  Diagnosis Date  . Atrial fibrillation ([place])   . BPH (benign prostatic hyperplasia)   . CKD (chronic kidney disease)   . History of coronary angioplasty with insertion of stent   . Hypertension   . Severe sepsis ([place]) 11/04/2019  . Stroke [facility])     Past Surgical History:  Procedure Laterality Date  . COLECTOMY  2006   cancer    There were no vitals filed for this visit.   Subjective Assessment - 02/05/20 1901    Subjective  Thank you for helping    Patient is accompanied by: Family member    Pain Score 0-No pain                        OT Treatments/Exercises (OP) - 02/05/20 0001      ADLs   ADL Comments Reviewed with daughter plan to see for approved 30 visits.  Do not anticpaite extending OT beyond that point at this time.        Neurological Re-education Exercises   Other Exercises 1 Neuromuscular reeducation to address postural control in sitting, and functional mobility in sitting - squatting and scooting as needed for transfers.  Patient able to sit upright and flex forward at hips to lift off surface.  He needed facilitation and guidance to weight shift left or right.  Worked on alignment in sitting to decrease excessive flexion with  forward head and right weight shift.  Patient able to demonstrate slightly improved active thoracic extension with head positioned upright.      Other Exercises 2 Worked on sitting posture than isolating movement in right UE.  On supported surface - able to flex/ext shoulder and elbow with mod facilitation and cueing                    OT Short Term Goals - 02/05/20 1908      OT SHORT TERM GOAL #1   Title Patient will complete lower body bathing with max assist due 6/19      OT SHORT TERM GOAL #2   Title Patient will bend forward while seated to pull pants up thighs    Status Achieved      OT SHORT TERM GOAL #3   Title Patient will transition from sit to stand with mod assist in prep for tolieting on commode    Status Achieved      OT SHORT TERM GOAL #4   Title Patient/caregiver will don/doff custom RUE splint with min cueing    Status Deferred             OT  Long Term Goals - 02/05/20 1908      OT LONG TERM GOAL #1   Title Patient will transiton from sit to stand with mod assist to allow toileting, clothing management, and hygiene    Time 6    Period Weeks    Status Achieved      OT LONG TERM GOAL #2   Title Patient/caregiver will complete a home exercise program to prevent pain, and improve passive motion in RUE to aide with hygiene and dressing tasks    Baseline Patient with pain throughout RUE    Time 6    Period Weeks    Status Achieved      OT LONG TERM GOAL #3   Title Patient / caregiver will demonstrate effective edema management techniques for right hand, and support for right arm to prevent further subluxation    Time 6    Period Weeks    Status Achieved      OT LONG TERM GOAL #4   Title Patient will attend to right arm, and manage right arm during transitional movements and in wheelchair with min cueing    Time 6    Period Weeks    Status On-going      OT LONG TERM GOAL #5   Title Patient will stand with min assist for sufficient time that  caregiver can complete toilet hygiene and clothing management due 9/24    Status On-going      OT LONG TERM GOAL #6   Title Patient will complete shower transfer into and out of tub with moderate assistance    Time 8    Period Weeks    Status On-going                 Plan - 02/05/20 1907    Clinical Impression Statement Patient with improved postural control in sitting which allows more active control and less assistance for transfers and ADL    OT Occupational Profile and History Detailed Assessment- Review of Records and additional review of physical, cognitive, psychosocial history related to current functional performance    Occupational performance deficits (Please refer to evaluation for details): ADL's;Rest and Sleep    Body Structure / Function / Physical Skills ADL;Coordination;Endurance;GMC;UE functional use;Balance;Decreased knowledge of precautions;Sensation;Pain;Flexibility;Body mechanics;Decreased knowledge of use of DME;FMC;Proprioception;Strength;Tone;ROM;Edema;Continence;Mobility    Cognitive Skills Attention;Emotional;Energy/Drive;Memory;Orientation;Perception;Problem Solve;Safety Awareness;Sequencing    Rehab Potential Fair    Clinical Decision Making Several treatment options, min-mod task modification necessary    Comorbidities Affecting Occupational Performance: May have comorbidities impacting occupational performance    Modification or Assistance to Complete Evaluation  Min-Moderate modification of tasks or assist with assess necessary to complete eval    OT Frequency 2x / week    OT Duration 6 weeks    OT Treatment/Interventions Self-care/ADL training;Electrical Stimulation;Therapeutic exercise;Visual/perceptual remediation/compensation;Patient/family education;Splinting;Neuromuscular education;Therapeutic activities;Balance training;Cognitive remediation/compensation;Passive range of motion;Manual Therapy;DME and/or AE instruction    Plan Recert for up to 30  visits - Postural control, sitting balance, sit to lift off, weight shift right, lower body ADL weight shifts, NMR RUE    OT Home Exercise Plan Sliding right hand on thigh, tilted stool    Consulted and Agree with Plan of Care Patient;Family member/caregiver    Family Member Consulted Daughter [name]           Patient will benefit from skilled therapeutic intervention in order to improve the following deficits and impairments:   Body Structure / Function / Physical Skills: ADL, Coordination, Endurance, GMC, UE functional use,  Balance, Decreased knowledge of precautions, Sensation, Pain, Flexibility, Body mechanics, Decreased knowledge of use of DME, FMC, Proprioception, Strength, Tone, ROM, Edema, Continence, Mobility Cognitive Skills: Attention, Emotional, Energy/Drive, Memory, Orientation, Perception, Problem Solve, Safety Awareness, Sequencing     Visit Diagnosis: Hemiplegia and hemiparesis following cerebral infarction affecting right dominant side (HCC)  Unsteadiness on feet  Neurologic neglect syndrome  Muscle weakness (generalized)  Attention and concentration deficit  Stiffness of right shoulder, not elsewhere classified  Localized edema    Problem List Patient Active Problem List   Diagnosis Date Noted  . Recurrent UTI 01/09/2020  . History of basal cell cancer 01/09/2020  . Chronic respiratory failure with hypoxia ([place]) 11/07/2019  . Pressure injury of skin 11/05/2019  . Acute renal failure superimposed on stage 4 chronic kidney disease ([place]) 11/04/2019  . Right hemiparesis ([place]) 11/04/2019  . Atrial fibrillation, chronic ([place]) 11/04/2019  . Dysphagia 11/04/2019  . 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 CKD (chronic kidney disease) stage 4, GFR 15-29 ml/min (HCC) 09/29/2019  . Thrombocytopenia ([place]) 09/29/2019  . Personal history of DVT (deep vein thrombosis) 09/09/2019  . History of ischemic stroke 09/09/2019  . CAD (coronary  artery disease) 09/09/2019  . Chronic constipation 09/09/2019  . History of colorectal cancer 09/09/2019  . 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 Right arm weakness 09/09/2019  . Right leg weakness 09/09/2019  . Pseudobulbar affect 09/09/2019    Mariah Milling, OTR/L 02/05/2020, 7:09 PM  [facility] [address] [place], [place], [address] Phone: [phone]   Fax:  [phone]  Name: Robert Mcconnell MRN: [id number] Date of Birth: 10-17-1921

## 2020-02-10 ENCOUNTER — Ambulatory Visit: Payer: No Typology Code available for payment source | Admitting: Occupational Therapy

## 2020-02-10 ENCOUNTER — Encounter: Payer: Self-pay | Admitting: Occupational Therapy

## 2020-02-10 ENCOUNTER — Other Ambulatory Visit: Payer: Self-pay

## 2020-02-10 ENCOUNTER — Ambulatory Visit: Payer: No Typology Code available for payment source | Attending: Physician Assistant

## 2020-02-10 DIAGNOSIS — I69351 Hemiplegia and hemiparesis following cerebral infarction affecting right dominant side: Secondary | ICD-10-CM | POA: Insufficient documentation

## 2020-02-10 DIAGNOSIS — R41840 Attention and concentration deficit: Secondary | ICD-10-CM | POA: Diagnosis present

## 2020-02-10 DIAGNOSIS — R2681 Unsteadiness on feet: Secondary | ICD-10-CM

## 2020-02-10 DIAGNOSIS — R600 Localized edema: Secondary | ICD-10-CM

## 2020-02-10 DIAGNOSIS — M25611 Stiffness of right shoulder, not elsewhere classified: Secondary | ICD-10-CM | POA: Diagnosis present

## 2020-02-10 DIAGNOSIS — M6281 Muscle weakness (generalized): Secondary | ICD-10-CM | POA: Diagnosis present

## 2020-02-10 DIAGNOSIS — R2689 Other abnormalities of gait and mobility: Secondary | ICD-10-CM | POA: Insufficient documentation

## 2020-02-10 DIAGNOSIS — R414 Neurologic neglect syndrome: Secondary | ICD-10-CM | POA: Diagnosis present

## 2020-02-10 NOTE — Therapy (Signed)
[facility] [address] [place], [place], [address] Phone: [phone]   Fax:  [phone]  Physical Therapy Treatment  Patient Details  Name: Robert Mcconnell MRN: [id number] Date of Birth: December 26, 1921 Referring Provider (PT): Arnell Sieving   Encounter Date: 02/10/2020   PT End of Session - 02/10/20 2241    Visit Number 37    Number of Visits 32    Date for PT Re-Evaluation 12/25/19    Authorization Type VA, eval 08/14/80, recert 10/09/13    Authorization Time Period 15 visits from 7/8-11/5    Authorization - Visit Number 14    Authorization - Number of Visits 15    Progress Note Due on Visit 15    PT Start Time 1615    PT Stop Time 1700    PT Time Calculation (min) 45 min    Equipment Utilized During Treatment Gait belt    Activity Tolerance Patient tolerated treatment well    Behavior During Therapy [facility] for tasks assessed/performed           Past Medical History:  Diagnosis Date  . Atrial fibrillation ([place])   . BPH (benign prostatic hyperplasia)   . CKD (chronic kidney disease)   . History of coronary angioplasty with insertion of stent   . Hypertension   . Severe sepsis ([place]) 11/04/2019  . Stroke [facility])     Past Surgical History:  Procedure Laterality Date  . COLECTOMY  2006   cancer    There were no vitals filed for this visit.   Subjective Assessment - 02/10/20 2228    Subjective Daughter reports that she was able to give him shower over the weekend by herself. They have the new hemi walker and they have not used it yet.    Patient is accompained by: Family member    Pertinent History CVA    Limitations Standing;Walking;House hold activities;Sitting                Gait training: 4 x 15 feet with hemi walker and max A to prevent adduction of R LE, advance R LE, and provide trunk support  Chair to mat transfer: max A with hemi walker, poor eccentric control noted with stand to  sit                       PT Short Term Goals - 01/13/20 1836      PT SHORT TERM GOAL #1   Title Patient will be able to perform sit to stand with mod A with or without AD to improve independence and reduce care giver burden    Baseline max A (eval); mod to max A (10/30/19), mox to max A (12/01/19); mod A with 1 UE support (01/13/20)    Time 4    Period Weeks    Status Achieved    Target Date 11/27/19      PT SHORT TERM GOAL #2   Title pt will be able to stand with mod A for 5 min to perform small reaching with L UE to improve standing balance.    Baseline able to stand with holding on to parallel bars with L UE but needs stabilization at knees to improve standing tolerance (max A required overall) is able to reach with max A, requires mod to max A but unable to hold for 5 min while performing reaching. Requires mod to max A due to inconsistency (01/13/20)    Time 4  Period Weeks    Status On-going    Target Date 02/10/20      PT SHORT TERM GOAL #3   Title Pt will be able to propel chair with SBA  for 30 feet to improve household navigation    Baseline Able to propel chair with use of L UE and L LE with mod A for 30 feet (10/30/19); patient has power chair now (12/01/19)    Time 4    Period Weeks    Status Not Met    Target Date 10/06/19             PT Long Term Goals - 01/13/20 1838      PT LONG TERM GOAL #1   Title Patient will be able to stand for 5 min with CGA to improve standing endurance with or without AD    Baseline requires max A    Time 8    Period Weeks    Status On-going    Target Date 03/09/20      PT LONG TERM GOAL #2   Title Patient will be able to perform chair to bed transfer using hemi walker and mod A to reduce caregiver burden    Baseline max A with stand pivot transfers    Time 8    Period Weeks    Status On-going    Target Date 03/09/20      PT LONG TERM GOAL #3   Title Pt will be able to ambulate 10 feet with hemi walker and  mod to max A to improve in home mobility    Baseline total A with body weight support system    Time 8    Period Weeks    Status Revised      PT LONG TERM GOAL #4   Title Pt will demo >10/56 on BBS to improve static balance and reduce fall risk    Baseline BBS 4/56 (eval)    Time 8    Period Weeks    Status On-going    Target Date 03/09/20                 Plan - 02/10/20 2240    Clinical Impression Statement Pt demonstrated decreased walking distance and still required max A with ambulation due to excessive R lean. Pt deonstrated 4 x 15 feet of gait with hemi walker and max A today. Pt required max A from chair to bed with 90 deg pivot transfer.    Personal Factors and Comorbidities Age;Comorbidity 3+    Comorbidities hx of stroke, hx of colorectal cancer    Examination-Activity Limitations Bathing;Bed Mobility;Caring for Others;Carry;Locomotion Level;Hygiene/Grooming;Dressing;Continence;Self Feeding;Reach Overhead;Sit;Lift;Other;Transfers;Toileting;Stand;Stairs    Rehab Potential Good    PT Frequency 2x / week    PT Duration 8 weeks    PT Treatment/Interventions ADLs/Self Care Home Management;Electrical Stimulation;Gait training;Stair training;Functional mobility training;Therapeutic exercise;Balance training;Neuromuscular re-education;Therapeutic activities;Patient/family education;Wheelchair mobility training;Manual techniques;Passive range of motion    PT Next Visit Plan recertification next session    Consulted and Agree with Plan of Care Patient;Family member/caregiver    Family Member Consulted daughter           Patient will benefit from skilled therapeutic intervention in order to improve the following deficits and impairments:  Abnormal gait, Decreased activity tolerance, Decreased coordination, Decreased safety awareness, Decreased strength, Impaired flexibility, Impaired UE functional use, Postural dysfunction, Impaired tone, Increased edema, Decreased range of  motion, Decreased knowledge of precautions, Impaired sensation, Difficulty walking, Decreased mobility, Decreased endurance, Decreased  balance  Visit Diagnosis: Hemiplegia and hemiparesis following cerebral infarction affecting right dominant side (HCC)  Unsteadiness on feet  Muscle weakness (generalized)     Problem List Patient Active Problem List   Diagnosis Date Noted  . Recurrent UTI 01/09/2020  . History of basal cell cancer 01/09/2020  . Chronic respiratory failure with hypoxia ([place]) 11/07/2019  . Pressure injury of skin 11/05/2019  . Acute renal failure superimposed on stage 4 chronic kidney disease ([place]) 11/04/2019  . Right hemiparesis ([place]) 11/04/2019  . Atrial fibrillation, chronic ([place]) 11/04/2019  . Dysphagia 11/04/2019  . 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 CKD (chronic kidney disease) stage 4, GFR 15-29 ml/min (HCC) 09/29/2019  . Thrombocytopenia ([place]) 09/29/2019  . Personal history of DVT (deep vein thrombosis) 09/09/2019  . History of ischemic stroke 09/09/2019  . CAD (coronary artery disease) 09/09/2019  . Chronic constipation 09/09/2019  . History of colorectal cancer 09/09/2019  . 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 Right arm weakness 09/09/2019  . Right leg weakness 09/09/2019  . Pseudobulbar affect 09/09/2019    Kerrie Pleasure, PT 02/10/2020, 10:44 PM  [facility] [address] [place], [place], [address] Phone: [phone]   Fax:  [phone]  Name: Hridaan Bouse MRN: [id number] Date of Birth: 10/25/1921

## 2020-02-10 NOTE — Therapy (Signed)
[facility] [address] [place], [place], [address] Phone: [phone]   Fax:  [phone]  Occupational Therapy Treatment  Patient Details  Name: Robert Mcconnell MRN: [id number] Date of Birth: [birth year] Referring Provider (OT): Eliezer Lofts   Encounter Date: 02/10/2020   OT End of Session - 02/10/20 1917    Visit Number 26    Number of Visits 30    Date for OT Re-Evaluation 04/06/20    Authorization Type VA    Authorization - Visit Number 26    Authorization - Number of Visits 30    Progress Note Due on Visit 30    OT Start Time 1700    OT Stop Time 1745    OT Time Calculation (min) 45 min    Activity Tolerance Patient tolerated treatment well    Behavior During Therapy [facility] for tasks assessed/performed           Past Medical History:  Diagnosis Date  . Atrial fibrillation ([place])   . BPH (benign prostatic hyperplasia)   . CKD (chronic kidney disease)   . History of coronary angioplasty with insertion of stent   . Hypertension   . Severe sepsis ([place]) 11/04/2019  . Stroke [facility])     Past Surgical History:  Procedure Laterality Date  . COLECTOMY  2006   cancer    There were no vitals filed for this visit.   Subjective Assessment - 02/10/20 1656    Subjective  He took a shower on Saturday!  I didn't have a helper.    Patient is accompanied by: Family member    Currently in Pain? No/denies    Pain Score 0-No pain                        OT Treatments/Exercises (OP) - 02/10/20 0001      Neurological Re-education Exercises   Other Exercises 1 Neuromuscular reeducation to address postural control during simple transitional movements - sit to stand, squat, scooting.  Patient continues to resist movement toward "stronger" left side, and continues to push self toward right side.  On one occasion in an attempt to scoot himself - he fell over to right in sitting and was unable to catch himself.   Patient fearful of weight shift to left - but does well when he can weight shift toward a target.      Other Exercises 2 Continue to work on stand balance and decreased relaince on UEs for standing to allow patient to be able to toilet upright.                      OT Short Term Goals - 02/10/20 1918      OT SHORT TERM GOAL #1   Title Patient will complete lower body bathing with max assist due 6/19    Baseline total assist    Time 4    Period Weeks    Status Achieved    Target Date 10/25/19      OT SHORT TERM GOAL #2   Title Patient will bend forward while seated to pull pants up thighs    Baseline not able    Time 4    Period Weeks    Status Achieved      OT SHORT TERM GOAL #3   Title Patient will transition from sit to stand with mod assist in prep for tolieting on commode    Baseline total assist  Time 4    Period Weeks    Status Achieved      OT SHORT TERM GOAL #4   Title Patient/caregiver will don/doff custom RUE splint with min cueing    Time 4    Period Weeks    Status Deferred             OT Long Term Goals - 02/10/20 1919      OT LONG TERM GOAL #1   Title Patient will transiton from sit to stand with mod assist to allow toileting, clothing management, and hygiene    Time 6    Period Weeks    Status Achieved      OT LONG TERM GOAL #2   Title Patient/caregiver will complete a home exercise program to prevent pain, and improve passive motion in RUE to aide with hygiene and dressing tasks    Baseline Patient with pain throughout RUE    Time 6    Period Weeks    Status Achieved      OT LONG TERM GOAL #3   Title Patient / caregiver will demonstrate effective edema management techniques for right hand, and support for right arm to prevent further subluxation    Time 6    Period Weeks    Status Achieved      OT LONG TERM GOAL #4   Title Patient will attend to right arm, and manage right arm during transitional movements and in wheelchair  with min cueing    Time 6    Period Weeks    Status On-going    Target Date 04/06/20      OT LONG TERM GOAL #5   Title Patient will stand with min assist for sufficient time that caregiver can complete toilet hygiene and clothing management due 9/24    Status On-going    Target Date 04/06/20      OT LONG TERM GOAL #6   Title Patient will complete shower transfer into and out of tub with moderate assistance    Time 8    Period Weeks    Status On-going                 Plan - 02/10/20 1917    Clinical Impression Statement Patient with inconsistent performance of standing balance due to severity of perceptual deficits    OT Occupational Profile and History Detailed Assessment- Review of Records and additional review of physical, cognitive, psychosocial history related to current functional performance    Occupational performance deficits (Please refer to evaluation for details): ADL's;Rest and Sleep    Body Structure / Function / Physical Skills ADL;Coordination;Endurance;GMC;UE functional use;Balance;Decreased knowledge of precautions;Sensation;Pain;Flexibility;Body mechanics;Decreased knowledge of use of DME;FMC;Proprioception;Strength;Tone;ROM;Edema;Continence;Mobility    Cognitive Skills Attention;Emotional;Energy/Drive;Memory;Orientation;Perception;Problem Solve;Safety Awareness;Sequencing    Rehab Potential Fair    Clinical Decision Making Several treatment options, min-mod task modification necessary    Comorbidities Affecting Occupational Performance: May have comorbidities impacting occupational performance    Modification or Assistance to Complete Evaluation  Min-Moderate modification of tasks or assist with assess necessary to complete eval    OT Frequency 2x / week    OT Duration 6 weeks    OT Treatment/Interventions Self-care/ADL training;Electrical Stimulation;Therapeutic exercise;Visual/perceptual remediation/compensation;Patient/family  education;Splinting;Neuromuscular education;Therapeutic activities;Balance training;Cognitive remediation/compensation;Passive range of motion;Manual Therapy;DME and/or AE instruction    Plan Postural control, sitting balance, sit to lift off, weight shift right, lower body ADL weight shifts, NMR RUE    OT Home Exercise Plan Sliding right hand on thigh, tilted stool  Consulted and Agree with Plan of Care Patient;Family member/caregiver    Family Member Consulted Daughter [name]           Patient will benefit from skilled therapeutic intervention in order to improve the following deficits and impairments:   Body Structure / Function / Physical Skills: ADL, Coordination, Endurance, GMC, UE functional use, Balance, Decreased knowledge of precautions, Sensation, Pain, Flexibility, Body mechanics, Decreased knowledge of use of DME, FMC, Proprioception, Strength, Tone, ROM, Edema, Continence, Mobility Cognitive Skills: Attention, Emotional, Energy/Drive, Memory, Orientation, Perception, Problem Solve, Safety Awareness, Sequencing     Visit Diagnosis: Hemiplegia and hemiparesis following cerebral infarction affecting right dominant side ([place]) - Plan: Ot plan of care cert/re-cert  Neurologic neglect syndrome - Plan: Ot plan of care cert/re-cert  Unsteadiness on feet - Plan: Ot plan of care cert/re-cert  Muscle weakness (generalized) - Plan: Ot plan of care cert/re-cert  Attention and concentration deficit - Plan: Ot plan of care cert/re-cert  Stiffness of right shoulder, not elsewhere classified - Plan: Ot plan of care cert/re-cert  Localized edema - Plan: Ot plan of care cert/re-cert    Problem List Patient Active Problem List   Diagnosis Date Noted  . Recurrent UTI 01/09/2020  . History of basal cell cancer 01/09/2020  . Chronic respiratory failure with hypoxia ([place]) 11/07/2019  . Pressure injury of skin 11/05/2019  . Acute renal failure superimposed on stage 4 chronic kidney  disease ([place]) 11/04/2019  . Right hemiparesis ([place]) 11/04/2019  . Atrial fibrillation, chronic ([place]) 11/04/2019  . Dysphagia 11/04/2019  . 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 CKD (chronic kidney disease) stage 4, GFR 15-29 ml/min (HCC) 09/29/2019  . Thrombocytopenia ([place]) 09/29/2019  . Personal history of DVT (deep vein thrombosis) 09/09/2019  . History of ischemic stroke 09/09/2019  . CAD (coronary artery disease) 09/09/2019  . Chronic constipation 09/09/2019  . History of colorectal cancer 09/09/2019  . 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 Right arm weakness 09/09/2019  . Right leg weakness 09/09/2019  . Pseudobulbar affect 09/09/2019    Mariah Milling, OTR/L 02/10/2020, 7:23 PM  [facility] [address] [place], [place], [address] Phone: [phone]   Fax:  [phone]  Name: Josey Forcier MRN: [id number] Date of Birth: 1921/06/16

## 2020-02-12 ENCOUNTER — Encounter: Payer: Self-pay | Admitting: Occupational Therapy

## 2020-02-12 ENCOUNTER — Ambulatory Visit: Payer: No Typology Code available for payment source | Admitting: Occupational Therapy

## 2020-02-12 ENCOUNTER — Other Ambulatory Visit: Payer: Self-pay

## 2020-02-12 ENCOUNTER — Ambulatory Visit: Payer: No Typology Code available for payment source

## 2020-02-12 DIAGNOSIS — M25611 Stiffness of right shoulder, not elsewhere classified: Secondary | ICD-10-CM

## 2020-02-12 DIAGNOSIS — R414 Neurologic neglect syndrome: Secondary | ICD-10-CM

## 2020-02-12 DIAGNOSIS — R41840 Attention and concentration deficit: Secondary | ICD-10-CM

## 2020-02-12 DIAGNOSIS — R2681 Unsteadiness on feet: Secondary | ICD-10-CM

## 2020-02-12 DIAGNOSIS — M6281 Muscle weakness (generalized): Secondary | ICD-10-CM

## 2020-02-12 DIAGNOSIS — I69351 Hemiplegia and hemiparesis following cerebral infarction affecting right dominant side: Secondary | ICD-10-CM

## 2020-02-12 DIAGNOSIS — R600 Localized edema: Secondary | ICD-10-CM

## 2020-02-12 DIAGNOSIS — R2689 Other abnormalities of gait and mobility: Secondary | ICD-10-CM

## 2020-02-12 NOTE — Therapy (Signed)
[facility] [address] [place], [place], [address] Phone: [phone]   Fax:  [phone]  Physical Therapy Treatment  Patient Details  Name: Robert Mcconnell MRN: [id number] Date of Birth: [birth year] Referring Provider (PT): Arnell Sieving   Encounter Date: 02/12/2020   PT End of Session - 02/12/20 1805    Visit Number 38    Number of Visits 32    Date for PT Re-Evaluation 12/25/19    Authorization Type VA, eval 12/08/78, recert 0/3/49    Authorization Time Period 15 visits from 7/8-11/5    Authorization - Visit Number 21    Authorization - Number of Visits 15    Progress Note Due on Visit 15    PT Start Time 1615    PT Stop Time 1700    PT Time Calculation (min) 45 min    Equipment Utilized During Treatment Gait belt    Activity Tolerance Patient tolerated treatment well    Behavior During Therapy [facility] for tasks assessed/performed           Past Medical History:  Diagnosis Date  . Atrial fibrillation ([place])   . BPH (benign prostatic hyperplasia)   . CKD (chronic kidney disease)   . History of coronary angioplasty with insertion of stent   . Hypertension   . Severe sepsis ([place]) 11/04/2019  . Stroke [facility])     Past Surgical History:  Procedure Laterality Date  . COLECTOMY  2006   cancer    There were no vitals filed for this visit.   Subjective Assessment - 02/12/20 1804    Subjective Daughter reports that they trying to use hemi walker with trasnfers at home.    Patient is accompained by: Family member    Pertinent History CVA    Limitations Standing;Walking;House hold activities;Sitting                  Gait training: With knee immobilizer on R knee, boot slider on R foot 1 x 60 feet, 1 x 70 feet, 1 x 110 feet, 1 x 20 feet with mod to max A, tactile and verbal cues to lean to left side and encourage R LE swing. Sit to stand: mod to max A 5x                     PT  Short Term Goals - 01/13/20 1836      PT SHORT TERM GOAL #1   Title Patient will be able to perform sit to stand with mod A with or without AD to improve independence and reduce care giver burden    Baseline max A (eval); mod to max A (10/30/19), mox to max A (12/01/19); mod A with 1 UE support (01/13/20)    Time 4    Period Weeks    Status Achieved    Target Date 11/27/19      PT SHORT TERM GOAL #2   Title pt will be able to stand with mod A for 5 min to perform small reaching with L UE to improve standing balance.    Baseline able to stand with holding on to parallel bars with L UE but needs stabilization at knees to improve standing tolerance (max A required overall) is able to reach with max A, requires mod to max A but unable to hold for 5 min while performing reaching. Requires mod to max A due to inconsistency (01/13/20)    Time 4  Period Weeks    Status On-going    Target Date 02/10/20      PT SHORT TERM GOAL #3   Title Pt will be able to propel chair with SBA  for 30 feet to improve household navigation    Baseline Able to propel chair with use of L UE and L LE with mod A for 30 feet (10/30/19); patient has power chair now (12/01/19)    Time 4    Period Weeks    Status Not Met    Target Date 10/06/19             PT Long Term Goals - 01/13/20 1838      PT LONG TERM GOAL #1   Title Patient will be able to stand for 5 min with CGA to improve standing endurance with or without AD    Baseline requires max A    Time 8    Period Weeks    Status On-going    Target Date 03/09/20      PT LONG TERM GOAL #2   Title Patient will be able to perform chair to bed transfer using hemi walker and mod A to reduce caregiver burden    Baseline max A with stand pivot transfers    Time 8    Period Weeks    Status On-going    Target Date 03/09/20      PT LONG TERM GOAL #3   Title Pt will be able to ambulate 10 feet with hemi walker and mod to max A to improve in home mobility    Baseline  total A with body weight support system    Time 8    Period Weeks    Status Revised      PT LONG TERM GOAL #4   Title Pt will demo >10/56 on BBS to improve static balance and reduce fall risk    Baseline BBS 4/56 (eval)    Time 8    Period Weeks    Status On-going    Target Date 03/09/20                 Plan - 02/12/20 1804    Clinical Impression Statement With knee immobilizer brace on R knee, patient demonstrated significanly less lean towards his affected side with gait training and required mod to max A (instead of max A)previously and was able to walk 110' max without rest break today.    Personal Factors and Comorbidities Age;Comorbidity 3+    Comorbidities hx of stroke, hx of colorectal cancer    Examination-Activity Limitations Bathing;Bed Mobility;Caring for Others;Carry;Locomotion Level;Hygiene/Grooming;Dressing;Continence;Self Feeding;Reach Overhead;Sit;Lift;Other;Transfers;Toileting;Stand;Stairs    Rehab Potential Good    PT Frequency 2x / week    PT Duration 8 weeks    PT Treatment/Interventions ADLs/Self Care Home Management;Electrical Stimulation;Gait training;Stair training;Functional mobility training;Therapeutic exercise;Balance training;Neuromuscular re-education;Therapeutic activities;Patient/family education;Wheelchair mobility training;Manual techniques;Passive range of motion    PT Next Visit Plan Continue trial of knee immobilizer on R LE    Consulted and Agree with Plan of Care Patient;Family member/caregiver    Family Member Consulted daughter           Patient will benefit from skilled therapeutic intervention in order to improve the following deficits and impairments:  Abnormal gait, Decreased activity tolerance, Decreased coordination, Decreased safety awareness, Decreased strength, Impaired flexibility, Impaired UE functional use, Postural dysfunction, Impaired tone, Increased edema, Decreased range of motion, Decreased knowledge of precautions,  Impaired sensation, Difficulty walking, Decreased mobility, Decreased endurance, Decreased  balance  Visit Diagnosis: Hemiplegia and hemiparesis following cerebral infarction affecting right dominant side (HCC)  Muscle weakness (generalized)  Unsteadiness on feet  Other abnormalities of gait and mobility     Problem List Patient Active Problem List   Diagnosis Date Noted  . Recurrent UTI 01/09/2020  . History of basal cell cancer 01/09/2020  . Chronic respiratory failure with hypoxia ([place]) 11/07/2019  . Pressure injury of skin 11/05/2019  . Acute renal failure superimposed on stage 4 chronic kidney disease ([place]) 11/04/2019  . Right hemiparesis ([place]) 11/04/2019  . Atrial fibrillation, chronic ([place]) 11/04/2019  . Dysphagia 11/04/2019  . 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 CKD (chronic kidney disease) stage 4, GFR 15-29 ml/min (HCC) 09/29/2019  . Thrombocytopenia ([place]) 09/29/2019  . Personal history of DVT (deep vein thrombosis) 09/09/2019  . History of ischemic stroke 09/09/2019  . CAD (coronary artery disease) 09/09/2019  . Chronic constipation 09/09/2019  . History of colorectal cancer 09/09/2019  . 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 Right arm weakness 09/09/2019  . Right leg weakness 09/09/2019  . Pseudobulbar affect 09/09/2019    Kerrie Pleasure, PT 02/12/2020, 6:07 PM  [facility] [address] [place], [place], [address] Phone: [phone]   Fax:  [phone]  Name: Daequan Kozma MRN: [id number] Date of Birth: 08-Nov-1921

## 2020-02-12 NOTE — Therapy (Signed)
[facility] [address] [place], [place], [address] Phone: [phone]   Fax:  [phone]  Occupational Therapy Treatment  Patient Details  Name: Robert Mcconnell MRN: [id number] Date of Birth: [birth year] Referring Provider (OT): Robert Mcconnell   Encounter Date: 02/12/2020   OT End of Session - 02/12/20 1800    Visit Number 27    Number of Visits 30    Date for OT Re-Evaluation 04/06/20    Authorization Type VA    Authorization - Visit Number 70    Authorization - Number of Visits 30    Progress Note Due on Visit 108    OT Start Time 1703    OT Stop Time 3664    OT Time Calculation (min) 42 min    Activity Tolerance Patient tolerated treatment well    Behavior During Therapy [facility] for tasks assessed/performed           Past Medical History:  Diagnosis Date   Atrial fibrillation ([place])    BPH (benign prostatic hyperplasia)    CKD (chronic kidney disease)    History of coronary angioplasty with insertion of stent    Hypertension    Severe sepsis ([place]) 11/04/2019   Stroke [facility])     Past Surgical History:  Procedure Laterality Date   COLECTOMY  2006   cancer    There were no vitals filed for this visit.   Subjective Assessment - 02/12/20 1754    Patient is accompanied by: Family member    Currently in Pain? No/denies    Pain Score 0-No pain                        OT Treatments/Exercises (OP) - 02/12/20 0001      ADLs   Functional Mobility Worked on sit to stand and stand tolerance with decreased reliance on UE's.  Patient able to pull self to standing with little to no physical assistance, but unable to maintain weight through RLE.  If patient challenged to use left hand for sorting task, e.g out of support, quickly lost active extension in LE's and trunk.  Haf patient take seated rest breaks on high stool.  From this high perch - able to transition to stand without help.                       OT Short Term Goals - 02/12/20 1801      OT SHORT TERM GOAL #1   Title Patient will complete lower body bathing with max assist due 6/19    Baseline total assist    Time 4    Period Weeks    Status Achieved    Target Date 10/25/19      OT SHORT TERM GOAL #2   Title Patient will bend forward while seated to pull pants up thighs    Baseline not able    Time 4    Period Weeks    Status Achieved      OT SHORT TERM GOAL #3   Title Patient will transition from sit to stand with mod assist in prep for tolieting on commode    Baseline total assist    Time 4    Period Weeks    Status Achieved      OT SHORT TERM GOAL #4   Title Patient/caregiver will don/doff custom RUE splint with min cueing    Time 4    Period Weeks  Status Deferred             OT Long Term Goals - 02/12/20 1801      OT LONG TERM GOAL #1   Title Patient will transiton from sit to stand with mod assist to allow toileting, clothing management, and hygiene    Time 6    Period Weeks    Status Achieved      OT LONG TERM GOAL #2   Title Patient/caregiver will complete a home exercise program to prevent pain, and improve passive motion in RUE to aide with hygiene and dressing tasks    Baseline Patient with pain throughout RUE    Time 6    Period Weeks    Status Achieved      OT LONG TERM GOAL #3   Title Patient / caregiver will demonstrate effective edema management techniques for right hand, and support for right arm to prevent further subluxation    Time 6    Period Weeks    Status Achieved      OT LONG TERM GOAL #4   Title Patient will attend to right arm, and manage right arm during transitional movements and in wheelchair with min cueing    Time 6    Period Weeks    Status On-going      OT LONG TERM GOAL #5   Title Patient will stand with min assist for sufficient time that caregiver can complete toilet hygiene and clothing management due 9/24    Status On-going      OT  LONG TERM GOAL #6   Title Patient will complete shower transfer into and out of tub with moderate assistance    Time 8    Period Weeks    Status On-going                 Plan - 02/12/20 1800    Clinical Impression Statement Patient with inconsistent performance of standing balance due to severity of perceptual deficits    OT Occupational Profile and History Detailed Assessment- Review of Records and additional review of physical, cognitive, psychosocial history related to current functional performance    Occupational performance deficits (Please refer to evaluation for details): ADL's;Rest and Sleep    Body Structure / Function / Physical Skills ADL;Coordination;Endurance;GMC;UE functional use;Balance;Decreased knowledge of precautions;Sensation;Pain;Flexibility;Body mechanics;Decreased knowledge of use of DME;FMC;Proprioception;Strength;Tone;ROM;Edema;Continence;Mobility    Cognitive Skills Attention;Emotional;Energy/Drive;Memory;Orientation;Perception;Problem Solve;Safety Awareness;Sequencing    Rehab Potential Fair    Clinical Decision Making Several treatment options, min-mod task modification necessary    Comorbidities Affecting Occupational Performance: May have comorbidities impacting occupational performance    Modification or Assistance to Complete Evaluation  Min-Moderate modification of tasks or assist with assess necessary to complete eval    OT Frequency 2x / week    OT Duration 6 weeks    OT Treatment/Interventions Self-care/ADL training;Electrical Stimulation;Therapeutic exercise;Visual/perceptual remediation/compensation;Patient/family education;Splinting;Neuromuscular education;Therapeutic activities;Balance training;Cognitive remediation/compensation;Passive range of motion;Manual Therapy;DME and/or AE instruction    Plan Postural control, sitting balance, sit to lift off, weight shift right, lower body ADL weight shifts, NMR RUE    OT Home Exercise Plan Sliding right  hand on thigh, tilted stool    Consulted and Agree with Plan of Care Patient;Family member/caregiver    Family Member Consulted Daughter Robert Mcconnell           Patient will benefit from skilled therapeutic intervention in order to improve the following deficits and impairments:   Body Structure / Function / Physical Skills: ADL, Coordination, Endurance, GMC, UE  functional use, Balance, Decreased knowledge of precautions, Sensation, Pain, Flexibility, Body mechanics, Decreased knowledge of use of DME, FMC, Proprioception, Strength, Tone, ROM, Edema, Continence, Mobility Cognitive Skills: Attention, Emotional, Energy/Drive, Memory, Orientation, Perception, Problem Solve, Safety Awareness, Sequencing     Visit Diagnosis: Hemiplegia and hemiparesis following cerebral infarction affecting right dominant side (HCC)  Neurologic neglect syndrome  Unsteadiness on feet  Muscle weakness (generalized)  Attention and concentration deficit  Stiffness of right shoulder, not elsewhere classified  Localized edema    Problem List Patient Active Problem List   Diagnosis Date Noted   Recurrent UTI 01/09/2020   History of basal cell cancer 01/09/2020   Chronic respiratory failure with hypoxia ([place]) 11/07/2019   Pressure injury of skin 11/05/2019   Acute renal failure superimposed on stage 4 chronic kidney disease ([place]) 11/04/2019   Right hemiparesis ([place]) 11/04/2019   Atrial fibrillation, chronic ([place]) 11/04/2019   Dysphagia 11/04/2019   Dysphagia as late effect of cerebrovascular accident (CVA) 11/03/2019   CKD (chronic kidney disease) stage 4, GFR 15-29 ml/min ([place]) 09/29/2019   Thrombocytopenia ([place]) 09/29/2019   Personal history of DVT (deep vein thrombosis) 09/09/2019   History of ischemic stroke 09/09/2019   CAD (coronary artery disease) 09/09/2019   Chronic constipation 09/09/2019   History of colorectal cancer 09/09/2019   Facial paralysis on right side 09/09/2019    Right arm weakness 09/09/2019   Right leg weakness 09/09/2019   Pseudobulbar affect 09/09/2019    Mariah Milling, OTR/L 02/12/2020, 6:01 PM  [facility] [address] [place], [place], [address] Phone: [phone]   Fax:  [phone]  Name: Robert Mcconnell MRN: [id number] Date of Birth: 10/24/1921

## 2020-02-17 ENCOUNTER — Other Ambulatory Visit: Payer: Self-pay

## 2020-02-17 ENCOUNTER — Ambulatory Visit: Payer: No Typology Code available for payment source

## 2020-02-17 DIAGNOSIS — I69351 Hemiplegia and hemiparesis following cerebral infarction affecting right dominant side: Secondary | ICD-10-CM

## 2020-02-17 DIAGNOSIS — M6281 Muscle weakness (generalized): Secondary | ICD-10-CM

## 2020-02-17 DIAGNOSIS — R2681 Unsteadiness on feet: Secondary | ICD-10-CM

## 2020-02-17 DIAGNOSIS — R414 Neurologic neglect syndrome: Secondary | ICD-10-CM

## 2020-02-17 DIAGNOSIS — R2689 Other abnormalities of gait and mobility: Secondary | ICD-10-CM

## 2020-02-17 NOTE — Therapy (Signed)
[facility] [address] [place], [place], [address] Phone: [phone]   Fax:  [phone]  Physical Therapy Treatment  Patient Details  Name: Robert Mcconnell MRN: [id number] Date of Birth: [birth year] Referring Provider (PT): Arnell Sieving   Encounter Date: 02/17/2020   PT End of Session - 02/17/20 1849    Visit Number 39    Number of Visits 32    Date for PT Re-Evaluation 12/25/19    Authorization Type VA, eval 10/14/10, recert 11/10/15    Authorization Time Period 15 visits from 7/8-11/5    Authorization - Visit Number 89    Authorization - Number of Visits 15    Progress Note Due on Visit 15    PT Start Time 1745    PT Stop Time 0017    PT Time Calculation (min) 45 min    Equipment Utilized During Treatment Gait belt    Activity Tolerance Patient tolerated treatment well    Behavior During Therapy WFL for tasks assessed/performed           Past Medical History:  Diagnosis Date   Atrial fibrillation ([place])    BPH (benign prostatic hyperplasia)    CKD (chronic kidney disease)    History of coronary angioplasty with insertion of stent    Hypertension    Severe sepsis ([place]) 11/04/2019   Stroke [facility])     Past Surgical History:  Procedure Laterality Date   COLECTOMY  2006   cancer    There were no vitals filed for this visit.   Subjective Assessment - 02/17/20 1821    Subjective Daughter reports that they trying to use hemi walker with trasnfers at home.    Patient is accompained by: Family member    Pertinent History CVA    Limitations Standing;Walking;House hold activities;Sitting                  Sit to stand: max A due to knee immboilizer on R knee: 5x during the session Gait training: mod to max A with hemi walker, foot up brace on R foot and knee immboilizer on R knee, mod to max A to advance R LE forward, stabilize trunk, cues to improve weight shifting to L side Standing  box kicks: R leg 2 x 5, L leg 2 x 5 with hemi walker and mod to max A Standing head turns: left and right: 2 x 10 Standing lateral weight shifts: 15x R and L Standing partial tandem anterior and posteriro weight shifts: 10x R and L                       PT Short Term Goals - 01/13/20 1836      PT SHORT TERM GOAL #1   Title Patient will be able to perform sit to stand with mod A with or without AD to improve independence and reduce care giver burden    Baseline max A (eval); mod to max A (10/30/19), mox to max A (12/01/19); mod A with 1 UE support (01/13/20)    Time 4    Period Weeks    Status Achieved    Target Date 11/27/19      PT SHORT TERM GOAL #2   Title pt will be able to stand with mod A for 5 min to perform small reaching with L UE to improve standing balance.    Baseline able to stand with holding on to parallel bars with L UE  but needs stabilization at knees to improve standing tolerance (max A required overall) is able to reach with max A, requires mod to max A but unable to hold for 5 min while performing reaching. Requires mod to max A due to inconsistency (01/13/20)    Time 4    Period Weeks    Status On-going    Target Date 02/10/20      PT SHORT TERM GOAL #3   Title Pt will be able to propel chair with SBA  for 30 feet to improve household navigation    Baseline Able to propel chair with use of L UE and L LE with mod A for 30 feet (10/30/19); patient has power chair now (12/01/19)    Time 4    Period Weeks    Status Not Met    Target Date 10/06/19             PT Long Term Goals - 01/13/20 1838      PT LONG TERM GOAL #1   Title Patient will be able to stand for 5 min with CGA to improve standing endurance with or without AD    Baseline requires max A    Time 8    Period Weeks    Status On-going    Target Date 03/09/20      PT LONG TERM GOAL #2   Title Patient will be able to perform chair to bed transfer using hemi walker and mod A to reduce  caregiver burden    Baseline max A with stand pivot transfers    Time 8    Period Weeks    Status On-going    Target Date 03/09/20      PT LONG TERM GOAL #3   Title Pt will be able to ambulate 10 feet with hemi walker and mod to max A to improve in home mobility    Baseline total A with body weight support system    Time 8    Period Weeks    Status Revised      PT LONG TERM GOAL #4   Title Pt will demo >10/56 on BBS to improve static balance and reduce fall risk    Baseline BBS 4/56 (eval)    Time 8    Period Weeks    Status On-going    Target Date 03/09/20                 Plan - 02/17/20 1847    Clinical Impression Statement today's session was focused on continuation of gait training with hemi walker with emphasis on improving weight shift to L side during R and L swing phase. Knee immboiler, and foot up brace was utilized. Today, pt was having difficulty with R swing phase and needed assistance with R LE to adavnce it forward compared to last session. Pt able to ambulate 15' and 30' with hemi walker and mod to max A. We also worked on lateralweight shifts and partial tandem anterior and posterior weight shifts. Pt required mod A for balance.    Personal Factors and Comorbidities Age;Comorbidity 3+    Comorbidities hx of stroke, hx of colorectal cancer    Examination-Activity Limitations Bathing;Bed Mobility;Caring for Others;Carry;Locomotion Level;Hygiene/Grooming;Dressing;Continence;Self Feeding;Reach Overhead;Sit;Lift;Other;Transfers;Toileting;Stand;Stairs    Rehab Potential Good    PT Frequency 2x / week    PT Duration 8 weeks    PT Treatment/Interventions ADLs/Self Care Home Management;Electrical Stimulation;Gait training;Stair training;Functional mobility training;Therapeutic exercise;Balance training;Neuromuscular re-education;Therapeutic activities;Patient/family education;Wheelchair mobility training;Manual techniques;Passive range  of motion    PT Next Visit Plan  Continue trial of knee immobilizer on R LE    Consulted and Agree with Plan of Care Patient;Family member/caregiver    Family Member Consulted daughter           Patient will benefit from skilled therapeutic intervention in order to improve the following deficits and impairments:  Abnormal gait, Decreased activity tolerance, Decreased coordination, Decreased safety awareness, Decreased strength, Impaired flexibility, Impaired UE functional use, Postural dysfunction, Impaired tone, Increased edema, Decreased range of motion, Decreased knowledge of precautions, Impaired sensation, Difficulty walking, Decreased mobility, Decreased endurance, Decreased balance  Visit Diagnosis: Hemiplegia and hemiparesis following cerebral infarction affecting right dominant side (HCC)  Neurologic neglect syndrome  Unsteadiness on feet  Muscle weakness (generalized)  Other abnormalities of gait and mobility     Problem List Patient Active Problem List   Diagnosis Date Noted   Recurrent UTI 01/09/2020   History of basal cell cancer 01/09/2020   Chronic respiratory failure with hypoxia ([place]) 11/07/2019   Pressure injury of skin 11/05/2019   Acute renal failure superimposed on stage 4 chronic kidney disease ([place]) 11/04/2019   Right hemiparesis ([place]) 11/04/2019   Atrial fibrillation, chronic ([place]) 11/04/2019   Dysphagia 11/04/2019   Dysphagia as late effect of cerebrovascular accident (CVA) 11/03/2019   CKD (chronic kidney disease) stage 4, GFR 15-29 ml/min ([place]) 09/29/2019   Thrombocytopenia ([place]) 09/29/2019   Personal history of DVT (deep vein thrombosis) 09/09/2019   History of ischemic stroke 09/09/2019   CAD (coronary artery disease) 09/09/2019   Chronic constipation 09/09/2019   History of colorectal cancer 09/09/2019   Facial paralysis on right side 09/09/2019   Right arm weakness 09/09/2019   Right leg weakness 09/09/2019   Pseudobulbar affect 09/09/2019    Kerrie Pleasure, PT 02/17/2020, 6:51 PM  [facility] [address] [place], [place], [address] Phone: [phone]   Fax:  [phone]  Name: Robert Mcconnell MRN: [id number] Date of Birth: 10-28-1921

## 2020-02-19 ENCOUNTER — Other Ambulatory Visit: Payer: Self-pay

## 2020-02-19 ENCOUNTER — Ambulatory Visit: Payer: No Typology Code available for payment source

## 2020-02-19 DIAGNOSIS — I69351 Hemiplegia and hemiparesis following cerebral infarction affecting right dominant side: Secondary | ICD-10-CM

## 2020-02-19 DIAGNOSIS — M6281 Muscle weakness (generalized): Secondary | ICD-10-CM

## 2020-02-19 DIAGNOSIS — R2681 Unsteadiness on feet: Secondary | ICD-10-CM

## 2020-02-19 DIAGNOSIS — R2689 Other abnormalities of gait and mobility: Secondary | ICD-10-CM

## 2020-02-19 NOTE — Therapy (Signed)
[facility] [address] [place], [place], [address] Phone: [phone]   Fax:  [phone]  Physical Therapy Treatment  Patient Details  Name: Robert Mcconnell MRN: [id number] Date of Birth: Oct 27, 1921 Referring Provider (PT): Arnell Sieving   Encounter Date: 02/19/2020   PT End of Session - 02/19/20 1823    Visit Number 40    Number of Visits 32    Date for PT Re-Evaluation 12/25/19    Authorization Type VA, eval 07/15/66, recert 12/12/46    Authorization Time Period 15 visits from 7/8-11/5    Authorization - Visit Number 23    Authorization - Number of Visits 15    Progress Note Due on Visit 15    PT Start Time 1615    PT Stop Time 1700    PT Time Calculation (min) 45 min    Equipment Utilized During Treatment Gait belt    Activity Tolerance Patient tolerated treatment well    Behavior During Therapy [facility] for tasks assessed/performed           Past Medical History:  Diagnosis Date  . Atrial fibrillation ([place])   . BPH (benign prostatic hyperplasia)   . CKD (chronic kidney disease)   . History of coronary angioplasty with insertion of stent   . Hypertension   . Severe sepsis ([place]) 11/04/2019  . Stroke [facility])     Past Surgical History:  Procedure Laterality Date  . COLECTOMY  2006   cancer    There were no vitals filed for this visit.   Subjective Assessment - 02/19/20 1819    Subjective Daughter reports that they have tried walking in their kitchen once since last visit.    Patient is accompained by: Family member    Pertinent History CVA    Limitations Standing;Walking;House hold activities;Sitting                Gait training: knee immobilizer on R knee, blue boot slider on R foot  -Hemi walker: mod to max A to advance hemi walker, advance R LE, cueing for sequencing: 1 x 15 feet, 1 x 20 feet - Tall life walker: mod A to advance walker, advance R LE,cueing for sequencing and to keep R  arm on Walker: 2 x 20 feet, required max A for last trial of 1 x 20 feet due to fatigue                       PT Short Term Goals - 01/13/20 1836      PT SHORT TERM GOAL #1   Title Patient will be able to perform sit to stand with mod A with or without AD to improve independence and reduce care giver burden    Baseline max A (eval); mod to max A (10/30/19), mox to max A (12/01/19); mod A with 1 UE support (01/13/20)    Time 4    Period Weeks    Status Achieved    Target Date 11/27/19      PT SHORT TERM GOAL #2   Title pt will be able to stand with mod A for 5 min to perform small reaching with L UE to improve standing balance.    Baseline able to stand with holding on to parallel bars with L UE but needs stabilization at knees to improve standing tolerance (max A required overall) is able to reach with max A, requires mod to max A but unable to hold for  5 min while performing reaching. Requires mod to max A due to inconsistency (01/13/20)    Time 4    Period Weeks    Status On-going    Target Date 02/10/20      PT SHORT TERM GOAL #3   Title Pt will be able to propel chair with SBA  for 30 feet to improve household navigation    Baseline Able to propel chair with use of L UE and L LE with mod A for 30 feet (10/30/19); patient has power chair now (12/01/19)    Time 4    Period Weeks    Status Not Met    Target Date 10/06/19             PT Long Term Goals - 01/13/20 1838      PT LONG TERM GOAL #1   Title Patient will be able to stand for 5 min with CGA to improve standing endurance with or without AD    Baseline requires max A    Time 8    Period Weeks    Status On-going    Target Date 03/09/20      PT LONG TERM GOAL #2   Title Patient will be able to perform chair to bed transfer using hemi walker and mod A to reduce caregiver burden    Baseline max A with stand pivot transfers    Time 8    Period Weeks    Status On-going    Target Date 03/09/20      PT  LONG TERM GOAL #3   Title Pt will be able to ambulate 10 feet with hemi walker and mod to max A to improve in home mobility    Baseline total A with body weight support system    Time 8    Period Weeks    Status Revised      PT LONG TERM GOAL #4   Title Pt will demo >10/56 on BBS to improve static balance and reduce fall risk    Baseline BBS 4/56 (eval)    Time 8    Period Weeks    Status On-going    Target Date 03/09/20                 Plan - 02/19/20 1820    Clinical Impression Statement Today's skilled session was focused on continued progressin of patient's gait with hemi walker. Pt continues to required mod to max A to adavance R LE with hemi walker with moderate lean to R and anterior. Pt also requires moderate cueing with tactile input to adavance walker with L hand. We performed trial of Life walker, standing walker with hemi arm supports, Pt was required mod A with tallstanding walker and was able to adavance R LE on his own for 75% of the time for first 2 trials of 20 feet but required >80% A during last trial of 20 feet due to fatigue.    Personal Factors and Comorbidities Age;Comorbidity 3+    Comorbidities hx of stroke, hx of colorectal cancer    Examination-Activity Limitations Bathing;Bed Mobility;Caring for Others;Carry;Locomotion Level;Hygiene/Grooming;Dressing;Continence;Self Feeding;Reach Overhead;Sit;Lift;Other;Transfers;Toileting;Stand;Stairs    Rehab Potential Good    PT Frequency 2x / week    PT Duration 8 weeks    PT Treatment/Interventions ADLs/Self Care Home Management;Electrical Stimulation;Gait training;Stair training;Functional mobility training;Therapeutic exercise;Balance training;Neuromuscular re-education;Therapeutic activities;Patient/family education;Wheelchair mobility training;Manual techniques;Passive range of motion    PT Next Visit Plan Continue trial of knee immobilizer on R LE  Consulted and Agree with Plan of Care Patient;Family  member/caregiver    Family Member Consulted daughter           Patient will benefit from skilled therapeutic intervention in order to improve the following deficits and impairments:  Abnormal gait, Decreased activity tolerance, Decreased coordination, Decreased safety awareness, Decreased strength, Impaired flexibility, Impaired UE functional use, Postural dysfunction, Impaired tone, Increased edema, Decreased range of motion, Decreased knowledge of precautions, Impaired sensation, Difficulty walking, Decreased mobility, Decreased endurance, Decreased balance  Visit Diagnosis: Hemiplegia and hemiparesis following cerebral infarction affecting right dominant side (HCC)  Muscle weakness (generalized)  Other abnormalities of gait and mobility  Unsteadiness on feet     Problem List Patient Active Problem List   Diagnosis Date Noted  . Recurrent UTI 01/09/2020  . History of basal cell cancer 01/09/2020  . Chronic respiratory failure with hypoxia ([place]) 11/07/2019  . Pressure injury of skin 11/05/2019  . Acute renal failure superimposed on stage 4 chronic kidney disease ([place]) 11/04/2019  . Right hemiparesis ([place]) 11/04/2019  . Atrial fibrillation, chronic ([place]) 11/04/2019  . Dysphagia 11/04/2019  . 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 CKD (chronic kidney disease) stage 4, GFR 15-29 ml/min (HCC) 09/29/2019  . Thrombocytopenia ([place]) 09/29/2019  . Personal history of DVT (deep vein thrombosis) 09/09/2019  . History of ischemic stroke 09/09/2019  . CAD (coronary artery disease) 09/09/2019  . Chronic constipation 09/09/2019  . History of colorectal cancer 09/09/2019  . 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 Right arm weakness 09/09/2019  . Right leg weakness 09/09/2019  . Pseudobulbar affect 09/09/2019    Kerrie Pleasure, PT 02/19/2020, 6:24 PM  [facility] [address] [place], [place], [address] Phone: [phone]   Fax:  [phone]  Name: Robert Mcconnell MRN: [id number] Date of Birth: 08/31/1921

## 2020-02-24 ENCOUNTER — Other Ambulatory Visit: Payer: Self-pay

## 2020-02-24 ENCOUNTER — Ambulatory Visit: Payer: No Typology Code available for payment source

## 2020-02-24 DIAGNOSIS — R414 Neurologic neglect syndrome: Secondary | ICD-10-CM

## 2020-02-24 DIAGNOSIS — I69351 Hemiplegia and hemiparesis following cerebral infarction affecting right dominant side: Secondary | ICD-10-CM

## 2020-02-24 DIAGNOSIS — R2689 Other abnormalities of gait and mobility: Secondary | ICD-10-CM

## 2020-02-24 DIAGNOSIS — M6281 Muscle weakness (generalized): Secondary | ICD-10-CM

## 2020-02-24 DIAGNOSIS — R2681 Unsteadiness on feet: Secondary | ICD-10-CM

## 2020-02-24 NOTE — Therapy (Signed)
[facility] [address] [place], [place], [address] Phone: [phone]   Fax:  [phone]  Physical Therapy Treatment  Patient Details  Name: Robert Mcconnell MRN: [id number] Date of Birth: 1922/03/28 Referring Provider (PT): Arnell Sieving   Encounter Date: 02/24/2020   PT End of Session - 02/24/20 1932    Visit Number 41    Number of Visits 45    Date for PT Re-Evaluation 04/29/20    Authorization Type VA, eval 10/07/92, recert 11/10/52; VA auth 15 visits from 01/08/20 to 05/07/20    Authorization Time Period 15 visits from 9/2 to 05/07/20    Authorization - Visit Number 11    Authorization - Number of Visits 15    Progress Note Due on Visit 15    PT Start Time 1745    PT Stop Time 1830    PT Time Calculation (min) 45 min    Equipment Utilized During Treatment Gait belt    Activity Tolerance Patient tolerated treatment well    Behavior During Therapy [facility] for tasks assessed/performed           Past Medical History:  Diagnosis Date  . Atrial fibrillation ([place])   . BPH (benign prostatic hyperplasia)   . CKD (chronic kidney disease)   . History of coronary angioplasty with insertion of stent   . Hypertension   . Severe sepsis ([place]) 11/04/2019  . Stroke [facility])     Past Surgical History:  Procedure Laterality Date  . COLECTOMY  2006   cancer    There were no vitals filed for this visit.   Subjective Assessment - 02/24/20 1930    Subjective Daughter reports no new changes    Patient is accompained by: Family member    Pertinent History CVA    Limitations Standing;Walking;House hold activities;Sitting                 Gait training: knee immobilizer on R knee, blue boot slider on R foot - Parallel bar with board to prevent hyperadduction of foot: walking 3 ft fwd and 3 ft bwd (total 3x with knee immboilizer and 1x with GRAFO): mod A for fwd walking, max A for bwd walking   -Hemi walker: mod  to max A to advance hemi walker, advance R LE, cueing for sequencing: 1 x 42fet, 1 x 15 feet - Tall life walker: max A due to fatigue towards end of the session to advance walker, advance R LE,cueing for sequencing and to keep R arm on Walker:f 1 x 20 feet                         PT Short Term Goals - 01/13/20 1836      PT SHORT TERM GOAL #1   Title Patient will be able to perform sit to stand with mod A with or without AD to improve independence and reduce care giver burden    Baseline max A (eval); mod to max A (10/30/19), mox to max A (12/01/19); mod A with 1 UE support (01/13/20)    Time 4    Period Weeks    Status Achieved    Target Date 11/27/19      PT SHORT TERM GOAL #2   Title pt will be able to stand with mod A for 5 min to perform small reaching with L UE to improve standing balance.    Baseline able to stand with holding on to  parallel bars with L UE but needs stabilization at knees to improve standing tolerance (max A required overall) is able to reach with max A, requires mod to max A but unable to hold for 5 min while performing reaching. Requires mod to max A due to inconsistency (01/13/20)    Time 4    Period Weeks    Status On-going    Target Date 02/10/20      PT SHORT TERM GOAL #3   Title Pt will be able to propel chair with SBA  for 30 feet to improve household navigation    Baseline Able to propel chair with use of L UE and L LE with mod A for 30 feet (10/30/19); patient has power chair now (12/01/19)    Time 4    Period Weeks    Status Not Met    Target Date 10/06/19             PT Long Term Goals - 01/13/20 1838      PT LONG TERM GOAL #1   Title Patient will be able to stand for 5 min with CGA to improve standing endurance with or without AD    Baseline requires max A    Time 8    Period Weeks    Status On-going    Target Date 03/09/20      PT LONG TERM GOAL #2   Title Patient will be able to perform chair to bed transfer using hemi  walker and mod A to reduce caregiver burden    Baseline max A with stand pivot transfers    Time 8    Period Weeks    Status On-going    Target Date 03/09/20      PT LONG TERM GOAL #3   Title Pt will be able to ambulate 10 feet with hemi walker and mod to max A to improve in home mobility    Baseline total A with body weight support system    Time 8    Period Weeks    Status Revised      PT LONG TERM GOAL #4   Title Pt will demo >10/56 on BBS to improve static balance and reduce fall risk    Baseline BBS 4/56 (eval)    Time 8    Period Weeks    Status On-going    Target Date 03/09/20                 Plan - 02/24/20 1931    Clinical Impression Statement Today's session was focused on continued progression of gait training. We also tried trial of Ground reaction AFO but due to lack of heel strike, pt unable to prevent buckling and still required max A with ambulation with hemi walker and mod to max A with tall walker with hemi supports bil.    Personal Factors and Comorbidities Age;Comorbidity 3+    Comorbidities hx of stroke, hx of colorectal cancer    Examination-Activity Limitations Bathing;Bed Mobility;Caring for Others;Carry;Locomotion Level;Hygiene/Grooming;Dressing;Continence;Self Feeding;Reach Overhead;Sit;Lift;Other;Transfers;Toileting;Stand;Stairs    Rehab Potential Good    PT Frequency 2x / week    PT Duration 8 weeks    PT Treatment/Interventions ADLs/Self Care Home Management;Electrical Stimulation;Gait training;Stair training;Functional mobility training;Therapeutic exercise;Balance training;Neuromuscular re-education;Therapeutic activities;Patient/family education;Wheelchair mobility training;Manual techniques;Passive range of motion    PT Next Visit Plan Continue trial of knee immobilizer on R LE    Consulted and Agree with Plan of Care Patient;Family member/caregiver    Family Member Consulted daughter  Patient will benefit from skilled  therapeutic intervention in order to improve the following deficits and impairments:  Abnormal gait, Decreased activity tolerance, Decreased coordination, Decreased safety awareness, Decreased strength, Impaired flexibility, Impaired UE functional use, Postural dysfunction, Impaired tone, Increased edema, Decreased range of motion, Decreased knowledge of precautions, Impaired sensation, Difficulty walking, Decreased mobility, Decreased endurance, Decreased balance  Visit Diagnosis: Hemiplegia and hemiparesis following cerebral infarction affecting right dominant side (HCC)  Muscle weakness (generalized)  Other abnormalities of gait and mobility  Unsteadiness on feet  Neurologic neglect syndrome     Problem List Patient Active Problem List   Diagnosis Date Noted  . Recurrent UTI 01/09/2020  . History of basal cell cancer 01/09/2020  . Chronic respiratory failure with hypoxia ([place]) 11/07/2019  . Pressure injury of skin 11/05/2019  . Acute renal failure superimposed on stage 4 chronic kidney disease ([place]) 11/04/2019  . Right hemiparesis ([place]) 11/04/2019  . Atrial fibrillation, chronic ([place]) 11/04/2019  . Dysphagia 11/04/2019  . 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 CKD (chronic kidney disease) stage 4, GFR 15-29 ml/min (HCC) 09/29/2019  . Thrombocytopenia ([place]) 09/29/2019  . Personal history of DVT (deep vein thrombosis) 09/09/2019  . History of ischemic stroke 09/09/2019  . CAD (coronary artery disease) 09/09/2019  . Chronic constipation 09/09/2019  . History of colorectal cancer 09/09/2019  . 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 Right arm weakness 09/09/2019  . Right leg weakness 09/09/2019  . Pseudobulbar affect 09/09/2019    Kerrie Pleasure 02/24/2020, 7:37 PM  [facility] [address] [place], [place], [address] Phone: [phone]   Fax:  [phone]  Name:  Valente Fosberg MRN: [id number] Date of Birth: [birth year]

## 2020-02-26 ENCOUNTER — Other Ambulatory Visit: Payer: Self-pay

## 2020-02-26 ENCOUNTER — Ambulatory Visit: Payer: No Typology Code available for payment source

## 2020-02-26 DIAGNOSIS — R2681 Unsteadiness on feet: Secondary | ICD-10-CM

## 2020-02-26 DIAGNOSIS — I69351 Hemiplegia and hemiparesis following cerebral infarction affecting right dominant side: Secondary | ICD-10-CM | POA: Diagnosis not present

## 2020-02-26 DIAGNOSIS — R414 Neurologic neglect syndrome: Secondary | ICD-10-CM

## 2020-02-26 DIAGNOSIS — M6281 Muscle weakness (generalized): Secondary | ICD-10-CM

## 2020-02-26 DIAGNOSIS — R2689 Other abnormalities of gait and mobility: Secondary | ICD-10-CM

## 2020-02-27 ENCOUNTER — Ambulatory Visit (INDEPENDENT_AMBULATORY_CARE_PROVIDER_SITE_OTHER): Payer: Medicare Other | Admitting: Cardiology

## 2020-02-27 ENCOUNTER — Encounter: Payer: Self-pay | Admitting: Cardiology

## 2020-02-27 VITALS — BP 142/73 | HR 89 | Ht 67.0 in | Wt 170.0 lb

## 2020-02-27 DIAGNOSIS — I420 Dilated cardiomyopathy: Secondary | ICD-10-CM

## 2020-02-27 DIAGNOSIS — I482 Chronic atrial fibrillation, unspecified: Secondary | ICD-10-CM | POA: Diagnosis not present

## 2020-02-27 DIAGNOSIS — N1832 Chronic kidney disease, stage 3b: Secondary | ICD-10-CM

## 2020-02-27 DIAGNOSIS — I2510 Atherosclerotic heart disease of native coronary artery without angina pectoris: Secondary | ICD-10-CM

## 2020-02-27 DIAGNOSIS — Z8673 Personal history of transient ischemic attack (TIA), and cerebral infarction without residual deficits: Secondary | ICD-10-CM | POA: Diagnosis not present

## 2020-02-27 NOTE — Progress Notes (Signed)
Cardiology Office Note:    Date:  02/27/2020   ID:  Robert Mcconnell, DOB Aug 27, 1921, MRN [id number]  PCP:  [name], MD  Cardiologist:  Buford Dresser, MD  Referring MD: [name], *   CC: post hospital follow up  History of Present Illness:    Robert Mcconnell is a 86 y.o. male with a PMH of CAD s/p remote PCI to unknown vessel 10-15 years ago, HTN, HLD, CVA with residual right sided hemiparesis and dysarthria c/b pseudobulbar symptoms, BPH, and CKD stage 4. I met him during his hospitalization in 10/2019 for heart failure.  Today: Here with his daughter. Overall doing well from a volume perspective. We reviewed his medication list. Not taking nuedexta, as it was 1700/month. Was for crying inappropriately, which daughter states they can deal with.  Has had 2 more UTIs since hospitalization, seeing ID at the [place], had a CT earlier today. Awaiting discussion of next steps.  Overall no significant bleeding on apixaban. Occasional hemorrhoid flare, just spotting on toilet paper.  Denies chest pain, shortness of breath at rest or with normal exertion. No PND, orthopnea, or unexpected weight gain. No syncope or palpitations.  Past Medical History:  Diagnosis Date  . Atrial fibrillation ([place])   . BPH (benign prostatic hyperplasia)   . CKD (chronic kidney disease)   . History of coronary angioplasty with insertion of stent   . Hypertension   . Severe sepsis ([place]) 11/04/2019  . Stroke [facility])     Past Surgical History:  Procedure Laterality Date  . COLECTOMY  2006   cancer    Current Medications: Current Outpatient Medications on File Prior to Visit  Medication Sig  . apixaban (ELIQUIS) 2.5 MG TABS tablet Take 2.5 mg by mouth 2 (two) times daily.   [name] Kitchen atorvastatin (LIPITOR) 40 MG tablet Take 40 mg by mouth at bedtime.   . calcitRIOL (ROCALTROL) 1 MCG/ML solution Take 0.25 mcg by mouth every Monday, Wednesday, and Friday.   [name] Sodium 150 MG/15ML  syrup Take 150 mg by mouth daily.   . famotidine (PEPCID) 20 MG tablet Take 20 mg by mouth daily.   . finasteride (PROSCAR) 5 MG tablet Take 5 mg by mouth daily.  [name] Kitchen levothyroxine (SYNTHROID) 25 MCG tablet Take 25 mcg by mouth daily before breakfast.  . melatonin 3 MG TABS tablet Take 9 mg by mouth at bedtime.  . metoprolol succinate (TOPROL-XL) 25 MG 24 hr tablet Take 25 mg by mouth daily.  . mirtazapine (REMERON) 15 MG tablet Take 15 mg by mouth at bedtime.  . senna (SENOKOT) 8.6 MG TABS tablet Take 1 tablet by mouth daily.  [name] Kitchen terazosin (HYTRIN) 5 MG capsule Take 5 mg by mouth 2 (two) times daily.   . Zinc Sulfate (ZINC 15 PO) Take 15 mg by mouth 2 (two) times daily.   No current facility-administered medications on file prior to visit.     Allergies:   Patient has no known allergies.   Social History   Tobacco Use  . Smoking status: Never Smoker  . Smokeless tobacco: Never Used  Substance Use Topics  . Alcohol use: Yes    Comment: occasionally  . Drug use: Never    Family History: No history of premature heart disease  ROS:   Please see the history of present illness.  Additional pertinent ROS otherwise unremarkable.   EKGs/Labs/Other Studies Reviewed:    The following studies were reviewed today: Echo 11/04/19 1. Left ventricular ejection fraction, by  estimation, is 30 to 35%. The  left ventricle has moderate to severely decreased function. The left  ventricle demonstrates regional wall motion abnormalities (see scoring  diagram/findings for description). The  left ventricular internal cavity size was mildly dilated. Left ventricular  diastolic function could not be evaluated. There is severe hypokinesis of  the left ventricular, entire inferior wall, inferolateral wall and  inferoseptal wall.  2. Right ventricular systolic function is mildly reduced. The right  ventricular size is normal. There is moderately elevated pulmonary artery  systolic pressure.  3. Left  atrial size was severely dilated.  4. Right atrial size was severely dilated.  5. The mitral valve is normal in structure. Mild to moderate mitral valve  regurgitation.  6. Tricuspid valve regurgitation is mild to moderate.  7. The aortic valve is tricuspid. Aortic valve regurgitation is trivial.  Mild to moderate aortic valve sclerosis/calcification is present, without  any evidence of aortic stenosis.  8. Aortic dilatation noted. There is mild dilatation of the ascending  aorta measuring 44 mm.  9. The inferior vena cava is dilated in size with <50% respiratory  variability, suggesting right atrial pressure of 15 mmHg.   EKG:  EKG is personally reviewed.  The ekg ordered 11/25/19 demonstrates atrial fibrillation with slow ventricular response  Recent Labs: 11/03/2019: TSH 3.379 11/06/2019: ALT 19; Hemoglobin 10.5; Magnesium 1.9; Platelets 80 11/07/2019: BUN 29; Creatinine, Ser 1.67; Potassium 3.7; Sodium 144  Recent Lipid Panel    Component Value Date/Time   CHOL 98 11/05/2019 0303   TRIG 79 11/05/2019 0303   HDL 28 (L) 11/05/2019 0303   CHOLHDL 3.5 11/05/2019 0303   VLDL 16 11/05/2019 0303   LDLCALC 54 11/05/2019 0303    Physical Exam:    VS:  BP (!) 142/73   Pulse 89   Ht _0  (1.702 m)   Wt 170 lb (77.1 kg)   SpO2 96%   BMI 26.63 kg/m     Wt Readings from Last 3 Encounters:  02/27/20 170 lb (77.1 kg)  11/25/19 170 lb (77.1 kg)  11/03/19 164 lb 6.4 oz (74.6 kg)    GEN: frail gentleman, chronic speech difficulty. HEENT: Normal, moist mucous membranes NECK: No JVD CARDIAC: irregularly irregular rhythm, normal S1 and S2, no rubs or gallops. 1/6 systolic murmur. VASCULAR: Radial and DP pulses 2+ bilaterally. No carotid bruits RESPIRATORY:  Clear to auscultation without rales, wheezing or rhonchi  ABDOMEN: Soft, non-tender, non-distended MUSCULOSKELETAL:  Can move left arm independently. In wheelchair. SKIN: Warm and dry, chronic venous stasis changes with only  trivial edema NEUROLOGIC:  Right facial droop, dysarthria PSYCHIATRIC:  Improved pseudobulbar symptoms compared to my interaction with him at the hospital.  ASSESSMENT:    1. Dilated cardiomyopathy ([place])   2. Atrial fibrillation, chronic ([place])   3. Coronary artery disease involving native coronary artery of native heart without angina pectoris   4. History of CVA (cerebrovascular accident)   5. Stage 3b chronic kidney disease ([place])    PLAN:    Cardiomyopathy: appears euvolemic today -see my initial note from 11/05/19. We had a long conversation at that time regarding typical evaluation and management.  -family wishes for no invasive procedures -kidney disease precludes ACEI/ARB/ARNI/MRA. CKD stage 3b based on most recent labs -bradycardia prevents uptitration of beta blocker, continue metoprolol succinate 25 mg daily. Watch for syncope/hypotension/worsening bradycardia  Atrial fibrillation:  -CHA2DS2/VAS Stroke Risk Points= 6 -rate control, likely planned for permanent afib given no plans for rhythm control -tolerating apixaban  2.5 mg BID given age, renal function    CAD with prior stent: -no aspirin as he is on apixaban -continue statin  CVA: -no aspirin as he is on apixaban -statin as above  Cardiac risk counseling and prevention recommendations: -recommend heart healthy/Mediterranean diet, with whole grains, fruits, vegetable, fish, lean meats, nuts, and olive oil. Limit salt. -recommend moderate walking, 3-5 times/week for 30-50 minutes each session. Aim for at least 150 minutes.week. Goal should be pace of 3 miles/hours, or walking 1.5 miles in 30 minutes -recommend avoidance of tobacco products. Avoid excess alcohol.  Plan for follow up: 6 mos or sooner as needed  Buford Dresser, MD, PhD [place]  [facility]    Medication Adjustments/Labs and Tests Ordered: Current medicines are reviewed at length with the patient today.  Concerns regarding medicines  are outlined above.  No orders of the defined types were placed in this encounter.  No orders of the defined types were placed in this encounter.   Patient Instructions  Medication Instructions:  Your Physician recommend you continue on your current medication as directed.    *If you need a refill on your cardiac medications before your next appointment, please call your pharmacy*   Lab Work: None ordered   Testing/Procedures: None ordered    Follow-Up: At [facility], you and your health needs are our priority.  As part of our continuing mission to provide you with exceptional heart care, we have created designated Provider Care Teams.  These Care Teams include your primary Cardiologist (physician) and Advanced Practice Providers (APPs -  Physician Assistants and Nurse Practitioners) who all work together to provide you with the care you need, when you need it.  We recommend signing up for the patient portal called "MyChart".  Sign up information is provided on this After Visit Summary.  MyChart is used to connect with patients for Virtual Visits (Telemedicine).  Patients are able to view lab/test results, encounter notes, upcoming appointments, etc.  Non-urgent messages can be sent to your provider as well.   To learn more about what you can do with MyChart, go to [email or web address].    Your next appointment:   6 month(s)  The format for your next appointment:   In Person  Provider:   Buford Dresser, MD      Signed, Buford Dresser, MD PhD 02/27/2020  [place]

## 2020-02-27 NOTE — Patient Instructions (Signed)

## 2020-03-01 NOTE — Therapy (Signed)
[facility] [address] [place], [place], [address] Phone: [phone]   Fax:  [phone]  Physical Therapy Treatment  Patient Details  Name: Robert Mcconnell MRN: [id number] Date of Birth: 09-Jan-1922 Referring Provider (PT): Arnell Sieving   Encounter Date: 02/26/2020   PT End of Session - 03/01/20 1059    Visit Number 42    Number of Visits 45    Date for PT Re-Evaluation 04/29/20    Authorization Type VA, eval 01/10/84, recert 08/11/25; VA auth 15 visits from 01/08/20 to 05/07/20    Authorization Time Period 15 visits from 9/2 to 05/07/20    Authorization - Visit Number 11    Authorization - Number of Visits 15    Progress Note Due on Visit 15    Equipment Utilized During Treatment Gait belt    Activity Tolerance Patient tolerated treatment well    Behavior During Therapy [facility] for tasks assessed/performed           Past Medical History:  Diagnosis Date  . Atrial fibrillation ([place])   . BPH (benign prostatic hyperplasia)   . CKD (chronic kidney disease)   . History of coronary angioplasty with insertion of stent   . Hypertension   . Severe sepsis ([place]) 11/04/2019  . Stroke [facility])     Past Surgical History:  Procedure Laterality Date  . COLECTOMY  2006   cancer    There were no vitals filed for this visit.   Subjective Assessment - 03/01/20 1057    Subjective Trying to walk a little at home    Patient is accompained by: Family member    Pertinent History CVA    Limitations Standing;Walking;House hold activities;Sitting    How long can you sit comfortably? 1-2 hours    How long can you stand comfortably? <5 min    How long can you walk comfortably? unable    Patient Stated Goals walk    Currently in Pain? No/denies                     Gait training: knee immobilizer on R knee, blue boot slider on R foot - Parallel bar with board to prevent hyperadduction of foot: walking 10 feet x 63mx  A to advance R LE forward, max A to prevent R lean and needed max cueing to lean on L side during ambulation  -Hemi walker: mod to max A to advance hemi walker, advance R LE, cueing for sequencing: 3 x 10 feet- Tall life walker: max A due to fatigue towards end of the session to advance walker, advance R LE,cueing for sequencing and to keep R arm on Walker:f 1 x 20 feet                          PT Short Term Goals - 01/13/20 1836      PT SHORT TERM GOAL #1   Title Patient will be able to perform sit to stand with mod A with or without AD to improve independence and reduce care giver burden    Baseline max A (eval); mod to max A (10/30/19), mox to max A (12/01/19); mod A with 1 UE support (01/13/20)    Time 4    Period Weeks    Status Achieved    Target Date 11/27/19      PT SHORT TERM GOAL #2   Title pt will be able to stand with mod  A for 5 min to perform small reaching with L UE to improve standing balance.    Baseline able to stand with holding on to parallel bars with L UE but needs stabilization at knees to improve standing tolerance (max A required overall) is able to reach with max A, requires mod to max A but unable to hold for 5 min while performing reaching. Requires mod to max A due to inconsistency (01/13/20)    Time 4    Period Weeks    Status On-going    Target Date 02/10/20      PT SHORT TERM GOAL #3   Title Pt will be able to propel chair with SBA  for 30 feet to improve household navigation    Baseline Able to propel chair with use of L UE and L LE with mod A for 30 feet (10/30/19); patient has power chair now (12/01/19)    Time 4    Period Weeks    Status Not Met    Target Date 10/06/19             PT Long Term Goals - 01/13/20 1838      PT LONG TERM GOAL #1   Title Patient will be able to stand for 5 min with CGA to improve standing endurance with or without AD    Baseline requires max A    Time 8    Period Weeks    Status On-going     Target Date 03/09/20      PT LONG TERM GOAL #2   Title Patient will be able to perform chair to bed transfer using hemi walker and mod A to reduce caregiver burden    Baseline max A with stand pivot transfers    Time 8    Period Weeks    Status On-going    Target Date 03/09/20      PT LONG TERM GOAL #3   Title Pt will be able to ambulate 10 feet with hemi walker and mod to max A to improve in home mobility    Baseline total A with body weight support system    Time 8    Period Weeks    Status Revised      PT LONG TERM GOAL #4   Title Pt will demo >10/56 on BBS to improve static balance and reduce fall risk    Baseline BBS 4/56 (eval)    Time 8    Period Weeks    Status On-going    Target Date 03/09/20                 Plan - 03/01/20 1058    Clinical Impression Statement There was no change in patient's gait compared to last session. Pt still requires max A to ambulate with hemi walker. Pt able to advance R LE in parallel bar with mod A only but when walking with hemi walker, he requires max A due to excessive lean and difficulty advancing R LE.    Personal Factors and Comorbidities Age;Comorbidity 3+    Comorbidities hx of stroke, hx of colorectal cancer    Examination-Activity Limitations Bathing;Bed Mobility;Caring for Others;Carry;Locomotion Level;Hygiene/Grooming;Dressing;Continence;Self Feeding;Reach Overhead;Sit;Lift;Other;Transfers;Toileting;Stand;Stairs    Rehab Potential Good    PT Frequency 2x / week    PT Duration 8 weeks    PT Treatment/Interventions ADLs/Self Care Home Management;Electrical Stimulation;Gait training;Stair training;Functional mobility training;Therapeutic exercise;Balance training;Neuromuscular re-education;Therapeutic activities;Patient/family education;Wheelchair mobility training;Manual techniques;Passive range of motion    PT Next Visit Plan  Continue trial of knee immobilizer on R LE    Consulted and Agree with Plan of Care Patient;Family  member/caregiver    Family Member Consulted daughter           Patient will benefit from skilled therapeutic intervention in order to improve the following deficits and impairments:  Abnormal gait, Decreased activity tolerance, Decreased coordination, Decreased safety awareness, Decreased strength, Impaired flexibility, Impaired UE functional use, Postural dysfunction, Impaired tone, Increased edema, Decreased range of motion, Decreased knowledge of precautions, Impaired sensation, Difficulty walking, Decreased mobility, Decreased endurance, Decreased balance  Visit Diagnosis: Hemiplegia and hemiparesis following cerebral infarction affecting right dominant side (HCC)  Muscle weakness (generalized)  Other abnormalities of gait and mobility  Unsteadiness on feet  Neurologic neglect syndrome     Problem List Patient Active Problem List   Diagnosis Date Noted  . Recurrent UTI 01/09/2020  . History of basal cell cancer 01/09/2020  . Chronic respiratory failure with hypoxia ([place]) 11/07/2019  . Pressure injury of skin 11/05/2019  . Acute renal failure superimposed on stage 4 chronic kidney disease ([place]) 11/04/2019  . Right hemiparesis ([place]) 11/04/2019  . Atrial fibrillation, chronic ([place]) 11/04/2019  . Dysphagia 11/04/2019  . 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 CKD (chronic kidney disease) stage 4, GFR 15-29 ml/min (HCC) 09/29/2019  . Thrombocytopenia ([place]) 09/29/2019  . Personal history of DVT (deep vein thrombosis) 09/09/2019  . History of ischemic stroke 09/09/2019  . CAD (coronary artery disease) 09/09/2019  . Chronic constipation 09/09/2019  . History of colorectal cancer 09/09/2019  . 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 Right arm weakness 09/09/2019  . Right leg weakness 09/09/2019  . Pseudobulbar affect 09/09/2019    Kerrie Pleasure 03/01/2020, 11:01 AM  [facility] [address] [place], [place], [address] Phone: [phone]   Fax:  [phone]  Name: Robert Mcconnell MRN: [id number] Date of Birth: Apr 09, 1922

## 2020-03-02 ENCOUNTER — Ambulatory Visit: Payer: No Typology Code available for payment source

## 2020-03-02 ENCOUNTER — Ambulatory Visit: Payer: No Typology Code available for payment source | Admitting: Occupational Therapy

## 2020-03-02 ENCOUNTER — Other Ambulatory Visit: Payer: Self-pay

## 2020-03-02 DIAGNOSIS — I69351 Hemiplegia and hemiparesis following cerebral infarction affecting right dominant side: Secondary | ICD-10-CM | POA: Diagnosis not present

## 2020-03-02 DIAGNOSIS — R600 Localized edema: Secondary | ICD-10-CM

## 2020-03-02 DIAGNOSIS — M6281 Muscle weakness (generalized): Secondary | ICD-10-CM

## 2020-03-02 DIAGNOSIS — M25611 Stiffness of right shoulder, not elsewhere classified: Secondary | ICD-10-CM

## 2020-03-02 DIAGNOSIS — R2681 Unsteadiness on feet: Secondary | ICD-10-CM

## 2020-03-02 DIAGNOSIS — R414 Neurologic neglect syndrome: Secondary | ICD-10-CM

## 2020-03-02 DIAGNOSIS — R2689 Other abnormalities of gait and mobility: Secondary | ICD-10-CM

## 2020-03-02 DIAGNOSIS — R41840 Attention and concentration deficit: Secondary | ICD-10-CM

## 2020-03-02 NOTE — Therapy (Signed)
[facility] [address] [place], [place], [address] Phone: [phone]   Fax:  [phone]  Physical Therapy Treatment  Patient Details  Name: Robert Mcconnell MRN: [id number] Date of Birth: 1921-10-22 Referring Provider (PT): Arnell Sieving   Encounter Date: 03/02/2020   PT End of Session - 03/02/20 1708    Visit Number 43    Number of Visits 45    Date for PT Re-Evaluation 04/29/20    Authorization Type VA, eval 01/14/32, recert 12/07/48; VA auth 15 visits from 01/08/20 to 05/07/20    Authorization Time Period 15 visits from 9/2 to 05/07/20    Authorization - Visit Number 11    Authorization - Number of Visits 15    Progress Note Due on Visit 15    Equipment Utilized During Treatment Gait belt    Activity Tolerance Patient tolerated treatment well    Behavior During Therapy [facility] for tasks assessed/performed           Past Medical History:  Diagnosis Date  . Atrial fibrillation ([place])   . BPH (benign prostatic hyperplasia)   . CKD (chronic kidney disease)   . History of coronary angioplasty with insertion of stent   . Hypertension   . Severe sepsis ([place]) 11/04/2019  . Stroke [facility])     Past Surgical History:  Procedure Laterality Date  . COLECTOMY  2006   cancer    There were no vitals filed for this visit.   Subjective Assessment - 03/02/20 1634    Subjective No new changes    Patient is accompained by: Family member    Pertinent History CVA    Limitations Standing;Walking;House hold activities;Sitting    How long can you sit comfortably? 1-2 hours    How long can you stand comfortably? <5 min    How long can you walk comfortably? unable    Patient Stated Goals walk                   Nustep: UE and LE with R hand/wrist brace and strap around R foot to keep R UE and R LE on handle and pedal. Belt put around bil knees to keep R knee from falling out and to improve pushing through R heel:  level 1 for 3', leverl 4 for 3', level 6 for 5': total time11'  Gait training with boot slider on R foot, GRAFO on R Foot, 3 x 10 feet with standing walker with max A with moving R LE forward and shifting weigth to L LE when swinging R LE forward. During L stance phase, pt has excessive L knee flexion which is reducing patient's ability to adavnce R LE forward.  Sit to stand:3x during session with max a, from power chair, nustep  Sit to stand from ta;; stool: 10x with bil hands on tall walker- min A                     PT Short Term Goals - 01/13/20 1836      PT SHORT TERM GOAL #1   Title Patient will be able to perform sit to stand with mod A with or without AD to improve independence and reduce care giver burden    Baseline max A (eval); mod to max A (10/30/19), mox to max A (12/01/19); mod A with 1 UE support (01/13/20)    Time 4    Period Weeks    Status Achieved    Target  Date 11/27/19      PT SHORT TERM GOAL #2   Title pt will be able to stand with mod A for 5 min to perform small reaching with L UE to improve standing balance.    Baseline able to stand with holding on to parallel bars with L UE but needs stabilization at knees to improve standing tolerance (max A required overall) is able to reach with max A, requires mod to max A but unable to hold for 5 min while performing reaching. Requires mod to max A due to inconsistency (01/13/20)    Time 4    Period Weeks    Status On-going    Target Date 02/10/20      PT SHORT TERM GOAL #3   Title Pt will be able to propel chair with SBA  for 30 feet to improve household navigation    Baseline Able to propel chair with use of L UE and L LE with mod A for 30 feet (10/30/19); patient has power chair now (12/01/19)    Time 4    Period Weeks    Status Not Met    Target Date 10/06/19             PT Long Term Goals - 01/13/20 1838      PT LONG TERM GOAL #1   Title Patient will be able to stand for 5 min with CGA to  improve standing endurance with or without AD    Baseline requires max A    Time 8    Period Weeks    Status On-going    Target Date 03/09/20      PT LONG TERM GOAL #2   Title Patient will be able to perform chair to bed transfer using hemi walker and mod A to reduce caregiver burden    Baseline max A with stand pivot transfers    Time 8    Period Weeks    Status On-going    Target Date 03/09/20      PT LONG TERM GOAL #3   Title Pt will be able to ambulate 10 feet with hemi walker and mod to max A to improve in home mobility    Baseline total A with body weight support system    Time 8    Period Weeks    Status Revised      PT LONG TERM GOAL #4   Title Pt will demo >10/56 on BBS to improve static balance and reduce fall risk    Baseline BBS 4/56 (eval)    Time 8    Period Weeks    Status On-going    Target Date 03/09/20                 Plan - 03/02/20 1638    Clinical Impression Statement Pt demonstrated good improvement on being able to use Nustep at varying resistance. With R hand brace and R foot brace to keep R UE and LE secure on handle and foot pedal, he was able to perform reciprocating movement without being faitgued from 10 min. Pt still requires max A with sit to stand and with ambulation with tall walker.    Personal Factors and Comorbidities Age;Comorbidity 3+    Comorbidities hx of stroke, hx of colorectal cancer    Examination-Activity Limitations Bathing;Bed Mobility;Caring for Others;Carry;Locomotion Level;Hygiene/Grooming;Dressing;Continence;Self Feeding;Reach Overhead;Sit;Lift;Other;Transfers;Toileting;Stand;Stairs    Rehab Potential Good    PT Frequency 2x / week    PT Duration 8 weeks  PT Treatment/Interventions ADLs/Self Care Home Management;Electrical Stimulation;Gait training;Stair training;Functional mobility training;Therapeutic exercise;Balance training;Neuromuscular re-education;Therapeutic activities;Patient/family education;Wheelchair  mobility training;Manual techniques;Passive range of motion    PT Next Visit Plan Continue trial of knee immobilizer on R LE    Consulted and Agree with Plan of Care Patient;Family member/caregiver    Family Member Consulted daughter           Patient will benefit from skilled therapeutic intervention in order to improve the following deficits and impairments:  Abnormal gait, Decreased activity tolerance, Decreased coordination, Decreased safety awareness, Decreased strength, Impaired flexibility, Impaired UE functional use, Postural dysfunction, Impaired tone, Increased edema, Decreased range of motion, Decreased knowledge of precautions, Impaired sensation, Difficulty walking, Decreased mobility, Decreased endurance, Decreased balance  Visit Diagnosis: Hemiplegia and hemiparesis following cerebral infarction affecting right dominant side (HCC)  Muscle weakness (generalized)  Other abnormalities of gait and mobility  Unsteadiness on feet     Problem List Patient Active Problem List   Diagnosis Date Noted  . Recurrent UTI 01/09/2020  . History of basal cell cancer 01/09/2020  . Chronic respiratory failure with hypoxia ([place]) 11/07/2019  . Pressure injury of skin 11/05/2019  . Acute renal failure superimposed on stage 4 chronic kidney disease ([place]) 11/04/2019  . Right hemiparesis ([place]) 11/04/2019  . Atrial fibrillation, chronic ([place]) 11/04/2019  . Dysphagia 11/04/2019  . 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 CKD (chronic kidney disease) stage 4, GFR 15-29 ml/min (HCC) 09/29/2019  . Thrombocytopenia ([place]) 09/29/2019  . Personal history of DVT (deep vein thrombosis) 09/09/2019  . History of ischemic stroke 09/09/2019  . CAD (coronary artery disease) 09/09/2019  . Chronic constipation 09/09/2019  . History of colorectal cancer 09/09/2019  . 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 Right arm weakness 09/09/2019  . Right leg weakness  09/09/2019  . Pseudobulbar affect 09/09/2019    Kerrie Pleasure, PT 03/02/2020, 5:10 PM  [facility] [address] [place], [place], [address] Phone: [phone]   Fax:  [phone]  Name: Maddoxx Burkitt MRN: [id number] Date of Birth: Feb 04, 1922

## 2020-03-02 NOTE — Therapy (Signed)
[facility] [address] [place], [place], [address] Phone: [phone]   Fax:  [phone]  Occupational Therapy Treatment  Patient Details  Name: Robert Mcconnell MRN: [id number] Date of Birth: 24-Oct-1921 Referring Provider (OT): Eliezer Lofts   Encounter Date: 03/02/2020   OT End of Session - 03/02/20 1858    Visit Number 28    Number of Visits 30    Date for OT Re-Evaluation 04/06/20    Authorization Type VA    Authorization - Visit Number 28    Authorization - Number of Visits 30    Progress Note Due on Visit 30    OT Start Time 1700    OT Stop Time 1745    OT Time Calculation (min) 45 min    Activity Tolerance Patient tolerated treatment well    Behavior During Therapy [facility] for tasks assessed/performed           Past Medical History:  Diagnosis Date  . Atrial fibrillation ([place])   . BPH (benign prostatic hyperplasia)   . CKD (chronic kidney disease)   . History of coronary angioplasty with insertion of stent   . Hypertension   . Severe sepsis ([place]) 11/04/2019  . Stroke [facility])     Past Surgical History:  Procedure Laterality Date  . COLECTOMY  2006   cancer    There were no vitals filed for this visit.   Subjective Assessment - 03/02/20 1855    Subjective  Daughter reports that she can safely help him shower.    Patient is accompanied by: Family member    Pain Score 0-No pain                        OT Treatments/Exercises (OP) - 03/02/20 0001      ADLs   Functional Mobility Worked on scoot pivot transfers to level surface left and right this session with emphasis on forward weight shift and correct timing of push through legs.  Initially patient resistive of flexiong forward at hips, but with facilitation and repetition, able to follow.  Worked in upright sitting with RUE supported on table top to address body on arm weight shifts, and again forward flexion to aide with transitional  movements.                    OT Education - 03/02/20 1857    Education Details Reviewed paln for three remaining OT visits    Person(s) Educated Child(ren)    Methods Explanation    Comprehension Verbalized understanding            OT Short Term Goals - 02/12/20 1801      OT SHORT TERM GOAL #1   Title Patient will complete lower body bathing with max assist due 6/19    Baseline total assist    Time 4    Period Weeks    Status Achieved    Target Date 10/25/19      OT SHORT TERM GOAL #2   Title Patient will bend forward while seated to pull pants up thighs    Baseline not able    Time 4    Period Weeks    Status Achieved      OT SHORT TERM GOAL #3   Title Patient will transition from sit to stand with mod assist in prep for tolieting on commode    Baseline total assist    Time 4  Period Weeks    Status Achieved      OT SHORT TERM GOAL #4   Title Patient/caregiver will don/doff custom RUE splint with min cueing    Time 4    Period Weeks    Status Deferred             OT Long Term Goals - 03/02/20 1859      OT LONG TERM GOAL #1   Title Patient will transiton from sit to stand with mod assist to allow toileting, clothing management, and hygiene    Time 6    Period Weeks    Status Achieved      OT LONG TERM GOAL #2   Title Patient/caregiver will complete a home exercise program to prevent pain, and improve passive motion in RUE to aide with hygiene and dressing tasks    Baseline Patient with pain throughout RUE    Time 6    Period Weeks    Status Achieved      OT LONG TERM GOAL #3   Title Patient / caregiver will demonstrate effective edema management techniques for right hand, and support for right arm to prevent further subluxation    Time 6    Period Weeks    Status Achieved      OT LONG TERM GOAL #4   Title Patient will attend to right arm, and manage right arm during transitional movements and in wheelchair with min cueing    Time 6     Period Weeks    Status On-going      OT LONG TERM GOAL #5   Title Patient will stand with min assist for sufficient time that caregiver can complete toilet hygiene and clothing management due 9/24    Status On-going      OT LONG TERM GOAL #6   Title Patient will complete shower transfer into and out of tub with moderate assistance    Time 8    Period Weeks    Status On-going                 Plan - 03/02/20 1858    Clinical Impression Statement Patient with improved weight shift forward to assist with transfers    OT Occupational Profile and History Detailed Assessment- Review of Records and additional review of physical, cognitive, psychosocial history related to current functional performance    Occupational performance deficits (Please refer to evaluation for details): ADL's;Rest and Sleep    Body Structure / Function / Physical Skills ADL;Coordination;Endurance;GMC;UE functional use;Balance;Decreased knowledge of precautions;Sensation;Pain;Flexibility;Body mechanics;Decreased knowledge of use of DME;FMC;Proprioception;Strength;Tone;ROM;Edema;Continence;Mobility    Cognitive Skills Attention;Emotional;Energy/Drive;Memory;Orientation;Perception;Problem Solve;Safety Awareness;Sequencing    Rehab Potential Fair    Clinical Decision Making Several treatment options, min-mod task modification necessary    Comorbidities Affecting Occupational Performance: May have comorbidities impacting occupational performance    Modification or Assistance to Complete Evaluation  Min-Moderate modification of tasks or assist with assess necessary to complete eval    OT Frequency 2x / week    OT Duration 6 weeks    OT Treatment/Interventions Self-care/ADL training;Electrical Stimulation;Therapeutic exercise;Visual/perceptual remediation/compensation;Patient/family education;Splinting;Neuromuscular education;Therapeutic activities;Balance training;Cognitive remediation/compensation;Passive range of  motion;Manual Therapy;DME and/or AE instruction    Plan Postural control, sitting balance, sit to lift off, weight shift right, lower body ADL weight shifts, NMR RUE    OT Home Exercise Plan Sliding right hand on thigh, tilted stool    Consulted and Agree with Plan of Care Patient;Family member/caregiver    Family Member Consulted Daughter [name]  Patient will benefit from skilled therapeutic intervention in order to improve the following deficits and impairments:   Body Structure / Function / Physical Skills: ADL, Coordination, Endurance, GMC, UE functional use, Balance, Decreased knowledge of precautions, Sensation, Pain, Flexibility, Body mechanics, Decreased knowledge of use of DME, FMC, Proprioception, Strength, Tone, ROM, Edema, Continence, Mobility Cognitive Skills: Attention, Emotional, Energy/Drive, Memory, Orientation, Perception, Problem Solve, Safety Awareness, Sequencing     Visit Diagnosis: Hemiplegia and hemiparesis following cerebral infarction affecting right dominant side (HCC)  Muscle weakness (generalized)  Unsteadiness on feet  Neurologic neglect syndrome  Attention and concentration deficit  Stiffness of right shoulder, not elsewhere classified  Localized edema    Problem List Patient Active Problem List   Diagnosis Date Noted  . Recurrent UTI 01/09/2020  . History of basal cell cancer 01/09/2020  . Chronic respiratory failure with hypoxia ([place]) 11/07/2019  . Pressure injury of skin 11/05/2019  . Acute renal failure superimposed on stage 4 chronic kidney disease ([place]) 11/04/2019  . Right hemiparesis ([place]) 11/04/2019  . Atrial fibrillation, chronic ([place]) 11/04/2019  . Dysphagia 11/04/2019  . 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 CKD (chronic kidney disease) stage 4, GFR 15-29 ml/min (HCC) 09/29/2019  . Thrombocytopenia ([place]) 09/29/2019  . Personal history of DVT (deep vein thrombosis) 09/09/2019  . History  of ischemic stroke 09/09/2019  . CAD (coronary artery disease) 09/09/2019  . Chronic constipation 09/09/2019  . History of colorectal cancer 09/09/2019  . 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 Right arm weakness 09/09/2019  . Right leg weakness 09/09/2019  . Pseudobulbar affect 09/09/2019    Mariah Milling, OTR/L 03/02/2020, 7:00 PM  [facility] [address] [place], [place], [address] Phone: [phone]   Fax:  [phone]  Name: Robert Mcconnell MRN: [id number] Date of Birth: Apr 26, 1922

## 2020-03-04 ENCOUNTER — Ambulatory Visit: Payer: No Typology Code available for payment source

## 2020-03-04 ENCOUNTER — Other Ambulatory Visit: Payer: Self-pay

## 2020-03-04 DIAGNOSIS — R2689 Other abnormalities of gait and mobility: Secondary | ICD-10-CM

## 2020-03-04 DIAGNOSIS — R2681 Unsteadiness on feet: Secondary | ICD-10-CM

## 2020-03-04 DIAGNOSIS — I69351 Hemiplegia and hemiparesis following cerebral infarction affecting right dominant side: Secondary | ICD-10-CM | POA: Diagnosis not present

## 2020-03-04 DIAGNOSIS — M6281 Muscle weakness (generalized): Secondary | ICD-10-CM

## 2020-03-04 NOTE — Therapy (Signed)
[facility] [address] [place], [place], [address] Phone: [phone]   Fax:  [phone]  Physical Therapy Treatment  Patient Details  Name: Robert Mcconnell MRN: [id number] Date of Birth: 08-31-1921 Referring Provider (PT): Arnell Sieving   Encounter Date: 03/04/2020   PT End of Session - 03/04/20 1859    Visit Number 44    Number of Visits 45    Date for PT Re-Evaluation 04/29/20    Authorization Type VA, eval 10/10/77, recert 0/3/83; VA auth 15 visits from 01/08/20 to 05/07/20    Authorization Time Period 15 visits from 9/2 to 05/07/20    Authorization - Visit Number 12    Authorization - Number of Visits 15    Progress Note Due on Visit 15    PT Start Time 1615    PT Stop Time 1700    PT Time Calculation (min) 45 min    Equipment Utilized During Treatment Gait belt    Activity Tolerance Patient tolerated treatment well    Behavior During Therapy [facility] for tasks assessed/performed           Past Medical History:  Diagnosis Date  . Atrial fibrillation ([place])   . BPH (benign prostatic hyperplasia)   . CKD (chronic kidney disease)   . History of coronary angioplasty with insertion of stent   . Hypertension   . Severe sepsis ([place]) 11/04/2019  . Stroke [facility])     Past Surgical History:  Procedure Laterality Date  . COLECTOMY  2006   cancer    There were no vitals filed for this visit.   Subjective Assessment - 03/04/20 1858    Subjective No new changes    Patient is accompained by: Family member    Pertinent History CVA    Limitations Standing;Walking;House hold activities;Sitting    How long can you sit comfortably? 1-2 hours    How long can you stand comfortably? <5 min    How long can you walk comfortably? unable    Patient Stated Goals walk    Currently in Pain? No/denies                   Nustep: UE and LE with R hand/wrist brace and strap around R foot to keep R UE and R LE on  handle and pedal. Belt put around bil knees to keep R knee from falling out and to improve pushing through R heel: level 1 for 3', leverl 4 for 3', level 6 for 5': total time11'  Gait training with boot slider on R foot, GRAFO on bil Foot, 10 feet, 25 feet, 25 feet with standing walker with max A with moving R LE forward and shifting weigth to L LE when swinging R LE forward. During L stance phase, pt has excessive L knee flexion which is reducing patient's ability to adavnce R LE forward.  Sit to stand:3x during session with max a, from power chair, nustep                         PT Short Term Goals - 01/13/20 1836      PT SHORT TERM GOAL #1   Title Patient will be able to perform sit to stand with mod A with or without AD to improve independence and reduce care giver burden    Baseline max A (eval); mod to max A (10/30/19), mox to max A (12/01/19); mod A with 1 UE support (  01/13/20)    Time 4    Period Weeks    Status Achieved    Target Date 11/27/19      PT SHORT TERM GOAL #2   Title pt will be able to stand with mod A for 5 min to perform small reaching with L UE to improve standing balance.    Baseline able to stand with holding on to parallel bars with L UE but needs stabilization at knees to improve standing tolerance (max A required overall) is able to reach with max A, requires mod to max A but unable to hold for 5 min while performing reaching. Requires mod to max A due to inconsistency (01/13/20)    Time 4    Period Weeks    Status On-going    Target Date 02/10/20      PT SHORT TERM GOAL #3   Title Pt will be able to propel chair with SBA  for 30 feet to improve household navigation    Baseline Able to propel chair with use of L UE and L LE with mod A for 30 feet (10/30/19); patient has power chair now (12/01/19)    Time 4    Period Weeks    Status Not Met    Target Date 10/06/19             PT Long Term Goals - 01/13/20 1838      PT LONG TERM GOAL #1    Title Patient will be able to stand for 5 min with CGA to improve standing endurance with or without AD    Baseline requires max A    Time 8    Period Weeks    Status On-going    Target Date 03/09/20      PT LONG TERM GOAL #2   Title Patient will be able to perform chair to bed transfer using hemi walker and mod A to reduce caregiver burden    Baseline max A with stand pivot transfers    Time 8    Period Weeks    Status On-going    Target Date 03/09/20      PT LONG TERM GOAL #3   Title Pt will be able to ambulate 10 feet with hemi walker and mod to max A to improve in home mobility    Baseline total A with body weight support system    Time 8    Period Weeks    Status Revised      PT LONG TERM GOAL #4   Title Pt will demo >10/56 on BBS to improve static balance and reduce fall risk    Baseline BBS 4/56 (eval)    Time 8    Period Weeks    Status On-going    Target Date 03/09/20                 Plan - 03/04/20 1858    Clinical Impression Statement no signifiacnt change in amoutn of assistane required during trasnfers or ambulation. Pt did demo improved walking endurance and distance compared to last session. 10 feet, 25 feet, 25 feet. Having bil GRAFO on helped to improve keeping heel down on L foot and reducing L knee buckling mildly.    Personal Factors and Comorbidities Age;Comorbidity 3+    Comorbidities hx of stroke, hx of colorectal cancer    Examination-Activity Limitations Bathing;Bed Mobility;Caring for Others;Carry;Locomotion Level;Hygiene/Grooming;Dressing;Continence;Self Feeding;Reach Overhead;Sit;Lift;Other;Transfers;Toileting;Stand;Stairs    Rehab Potential Good    PT Frequency 2x / week  PT Duration 8 weeks    PT Treatment/Interventions ADLs/Self Care Home Management;Electrical Stimulation;Gait training;Stair training;Functional mobility training;Therapeutic exercise;Balance training;Neuromuscular re-education;Therapeutic activities;Patient/family  education;Wheelchair mobility training;Manual techniques;Passive range of motion    PT Next Visit Plan Continue trial of knee immobilizer on R LE    Consulted and Agree with Plan of Care Patient;Family member/caregiver    Family Member Consulted daughter           Patient will benefit from skilled therapeutic intervention in order to improve the following deficits and impairments:  Abnormal gait, Decreased activity tolerance, Decreased coordination, Decreased safety awareness, Decreased strength, Impaired flexibility, Impaired UE functional use, Postural dysfunction, Impaired tone, Increased edema, Decreased range of motion, Decreased knowledge of precautions, Impaired sensation, Difficulty walking, Decreased mobility, Decreased endurance, Decreased balance  Visit Diagnosis: Hemiplegia and hemiparesis following cerebral infarction affecting right dominant side (HCC)  Muscle weakness (generalized)  Unsteadiness on feet  Other abnormalities of gait and mobility     Problem List Patient Active Problem List   Diagnosis Date Noted  . Recurrent UTI 01/09/2020  . History of basal cell cancer 01/09/2020  . Chronic respiratory failure with hypoxia ([place]) 11/07/2019  . Pressure injury of skin 11/05/2019  . Acute renal failure superimposed on stage 4 chronic kidney disease ([place]) 11/04/2019  . Right hemiparesis ([place]) 11/04/2019  . Atrial fibrillation, chronic ([place]) 11/04/2019  . Dysphagia 11/04/2019  . 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 CKD (chronic kidney disease) stage 4, GFR 15-29 ml/min (HCC) 09/29/2019  . Thrombocytopenia ([place]) 09/29/2019  . Personal history of DVT (deep vein thrombosis) 09/09/2019  . History of ischemic stroke 09/09/2019  . CAD (coronary artery disease) 09/09/2019  . Chronic constipation 09/09/2019  . History of colorectal cancer 09/09/2019  . 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 Right arm weakness 09/09/2019  . Right  leg weakness 09/09/2019  . Pseudobulbar affect 09/09/2019    Kerrie Pleasure 03/04/2020, 7:01 PM  [facility] [address] [place], [place], [address] Phone: [phone]   Fax:  [phone]  Name: Erric Machnik MRN: [id number] Date of Birth: 09/09/1921

## 2020-03-05 ENCOUNTER — Encounter: Payer: Self-pay | Admitting: Cardiology

## 2020-03-05 DIAGNOSIS — I420 Dilated cardiomyopathy: Secondary | ICD-10-CM | POA: Insufficient documentation

## 2020-03-05 DIAGNOSIS — I6930 Unspecified sequelae of cerebral infarction: Secondary | ICD-10-CM | POA: Insufficient documentation

## 2020-03-05 DIAGNOSIS — Z8673 Personal history of transient ischemic attack (TIA), and cerebral infarction without residual deficits: Secondary | ICD-10-CM | POA: Insufficient documentation

## 2020-03-09 ENCOUNTER — Other Ambulatory Visit: Payer: Self-pay

## 2020-03-09 ENCOUNTER — Ambulatory Visit: Payer: No Typology Code available for payment source | Attending: Physician Assistant

## 2020-03-09 DIAGNOSIS — I69351 Hemiplegia and hemiparesis following cerebral infarction affecting right dominant side: Secondary | ICD-10-CM | POA: Insufficient documentation

## 2020-03-09 DIAGNOSIS — M6281 Muscle weakness (generalized): Secondary | ICD-10-CM | POA: Insufficient documentation

## 2020-03-09 DIAGNOSIS — M25611 Stiffness of right shoulder, not elsewhere classified: Secondary | ICD-10-CM | POA: Insufficient documentation

## 2020-03-09 DIAGNOSIS — R414 Neurologic neglect syndrome: Secondary | ICD-10-CM | POA: Insufficient documentation

## 2020-03-09 DIAGNOSIS — R41840 Attention and concentration deficit: Secondary | ICD-10-CM | POA: Insufficient documentation

## 2020-03-09 DIAGNOSIS — R2689 Other abnormalities of gait and mobility: Secondary | ICD-10-CM

## 2020-03-09 DIAGNOSIS — R2681 Unsteadiness on feet: Secondary | ICD-10-CM | POA: Insufficient documentation

## 2020-03-09 DIAGNOSIS — R600 Localized edema: Secondary | ICD-10-CM | POA: Insufficient documentation

## 2020-03-09 NOTE — Therapy (Signed)
[facility] [address] [place], [place], [address] Phone: [phone]   Fax:  [phone]  Physical Therapy Treatment  Patient Details  Name: Robert Mcconnell MRN: [id number] Date of Birth: [birth year] Referring Provider (PT): Arnell Sieving   Encounter Date: 03/09/2020   PT End of Session - 03/09/20 1848    Visit Number 45    Number of Visits 45    Date for PT Re-Evaluation 04/29/20    Authorization Type VA, eval 06/14/04, recert 06/10/74; VA auth 15 visits from 01/08/20 to 05/07/20    Authorization Time Period 15 visits from 9/2 to 05/07/20    Authorization - Visit Number 15    Authorization - Number of Visits 15    Progress Note Due on Visit 15    PT Start Time 1700    PT Stop Time 1745    PT Time Calculation (min) 45 min    Equipment Utilized During Treatment Gait belt    Activity Tolerance Patient tolerated treatment well    Behavior During Therapy [facility] for tasks assessed/performed           Past Medical History:  Diagnosis Date  . Atrial fibrillation ([place])   . BPH (benign prostatic hyperplasia)   . CKD (chronic kidney disease)   . History of coronary angioplasty with insertion of stent   . Hypertension   . Severe sepsis ([place]) 11/04/2019  . Stroke [facility])     Past Surgical History:  Procedure Laterality Date  . COLECTOMY  2006   cancer    There were no vitals filed for this visit.   Subjective Assessment - 03/09/20 1845    Subjective No new changes    Patient is accompained by: Family member    Pertinent History CVA    Limitations Standing;Walking;House hold activities;Sitting    How long can you sit comfortably? 1-2 hours    How long can you stand comfortably? <5 min    How long can you walk comfortably? unable    Patient Stated Goals walk    Currently in Pain? No/denies            Gait training: max A x 1-2 in parallel bars, max A to move R LE forward,intermittent tactile cues to shift  weight to L during L swing phase, step to pattern with L LE, max A to turn 180 deg in parall bars 1 x 10 feet, 1 x 40 feet, 3 x 30 feet, Sit to stand: 8 x during the session with max A with cues to reach forward, lean forward, trunk support and to lift up.                           PT Short Term Goals - 01/13/20 1836      PT SHORT TERM GOAL #1   Title Patient will be able to perform sit to stand with mod A with or without AD to improve independence and reduce care giver burden    Baseline max A (eval); mod to max A (10/30/19), mox to max A (12/01/19); mod A with 1 UE support (01/13/20)    Time 4    Period Weeks    Status Achieved    Target Date 11/27/19      PT SHORT TERM GOAL #2   Title pt will be able to stand with mod A for 5 min to perform small reaching with L UE to improve standing balance.  Baseline able to stand with holding on to parallel bars with L UE but needs stabilization at knees to improve standing tolerance (max A required overall) is able to reach with max A, requires mod to max A but unable to hold for 5 min while performing reaching. Requires mod to max A due to inconsistency (01/13/20)    Time 4    Period Weeks    Status On-going    Target Date 02/10/20      PT SHORT TERM GOAL #3   Title Pt will be able to propel chair with SBA  for 30 feet to improve household navigation    Baseline Able to propel chair with use of L UE and L LE with mod A for 30 feet (10/30/19); patient has power chair now (12/01/19)    Time 4    Period Weeks    Status Not Met    Target Date 10/06/19             PT Long Term Goals - 01/13/20 1838      PT LONG TERM GOAL #1   Title Patient will be able to stand for 5 min with CGA to improve standing endurance with or without AD    Baseline requires max A    Time 8    Period Weeks    Status On-going    Target Date 03/09/20      PT LONG TERM GOAL #2   Title Patient will be able to perform chair to bed transfer using  hemi walker and mod A to reduce caregiver burden    Baseline max A with stand pivot transfers    Time 8    Period Weeks    Status On-going    Target Date 03/09/20      PT LONG TERM GOAL #3   Title Pt will be able to ambulate 10 feet with hemi walker and mod to max A to improve in home mobility    Baseline total A with body weight support system    Time 8    Period Weeks    Status Revised      PT LONG TERM GOAL #4   Title Pt will demo >10/56 on BBS to improve static balance and reduce fall risk    Baseline BBS 4/56 (eval)    Time 8    Period Weeks    Status On-going    Target Date 03/09/20                 Plan - 03/09/20 1846    Clinical Impression Statement Despite patient still requirin max A for ambulation in parallel bars, patient demonstrated improved weight shift to his L side during L swing phase. Pt performed improved ambulation with step to pattern with L leg. Patient was able to turn 180 deg in parallel bars with max A x 2 today which he hasnot been able to perform before.    Personal Factors and Comorbidities Age;Comorbidity 3+    Comorbidities hx of stroke, hx of colorectal cancer    Examination-Activity Limitations Bathing;Bed Mobility;Caring for Others;Carry;Locomotion Level;Hygiene/Grooming;Dressing;Continence;Self Feeding;Reach Overhead;Sit;Lift;Other;Transfers;Toileting;Stand;Stairs    Rehab Potential Good    PT Frequency 2x / week    PT Duration 8 weeks    PT Treatment/Interventions ADLs/Self Care Home Management;Electrical Stimulation;Gait training;Stair training;Functional mobility training;Therapeutic exercise;Balance training;Neuromuscular re-education;Therapeutic activities;Patient/family education;Wheelchair mobility training;Manual techniques;Passive range of motion    PT Next Visit Plan Continue trial of knee immobilizer on R LE    Consulted  and Agree with Plan of Care Patient;Family member/caregiver    Family Member Consulted daughter            Patient will benefit from skilled therapeutic intervention in order to improve the following deficits and impairments:  Abnormal gait, Decreased activity tolerance, Decreased coordination, Decreased safety awareness, Decreased strength, Impaired flexibility, Impaired UE functional use, Postural dysfunction, Impaired tone, Increased edema, Decreased range of motion, Decreased knowledge of precautions, Impaired sensation, Difficulty walking, Decreased mobility, Decreased endurance, Decreased balance  Visit Diagnosis: Hemiplegia and hemiparesis following cerebral infarction affecting right dominant side (HCC)  Muscle weakness (generalized)  Unsteadiness on feet  Other abnormalities of gait and mobility     Problem List Patient Active Problem List   Diagnosis Date Noted  . Dilated cardiomyopathy ([place]) 03/05/2020  . History of CVA (cerebrovascular accident) 03/05/2020  . Recurrent UTI 01/09/2020  . History of basal cell cancer 01/09/2020  . Chronic respiratory failure with hypoxia ([place]) 11/07/2019  . Pressure injury of skin 11/05/2019  . Acute renal failure superimposed on stage 4 chronic kidney disease ([place]) 11/04/2019  . Right hemiparesis ([place]) 11/04/2019  . Atrial fibrillation, chronic ([place]) 11/04/2019  . Dysphagia 11/04/2019  . 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 CKD (chronic kidney disease) stage 4, GFR 15-29 ml/min (HCC) 09/29/2019  . Thrombocytopenia ([place]) 09/29/2019  . Personal history of DVT (deep vein thrombosis) 09/09/2019  . History of ischemic stroke 09/09/2019  . CAD (coronary artery disease) 09/09/2019  . Chronic constipation 09/09/2019  . History of colorectal cancer 09/09/2019  . 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 Right arm weakness 09/09/2019  . Right leg weakness 09/09/2019  . Pseudobulbar affect 09/09/2019    Kerrie Pleasure, PT 03/09/2020, 6:49 PM  [facility] [address] [place], [place], [address] Phone: [phone]   Fax:  [phone]  Name: Wisdom Rickey MRN: [id number] Date of Birth: 24-Aug-1921

## 2020-03-11 ENCOUNTER — Other Ambulatory Visit: Payer: Self-pay

## 2020-03-11 ENCOUNTER — Ambulatory Visit: Payer: No Typology Code available for payment source

## 2020-03-11 DIAGNOSIS — R2681 Unsteadiness on feet: Secondary | ICD-10-CM

## 2020-03-11 DIAGNOSIS — I69351 Hemiplegia and hemiparesis following cerebral infarction affecting right dominant side: Secondary | ICD-10-CM | POA: Diagnosis not present

## 2020-03-11 DIAGNOSIS — R2689 Other abnormalities of gait and mobility: Secondary | ICD-10-CM

## 2020-03-11 DIAGNOSIS — R600 Localized edema: Secondary | ICD-10-CM | POA: Diagnosis present

## 2020-03-11 DIAGNOSIS — M6281 Muscle weakness (generalized): Secondary | ICD-10-CM

## 2020-03-11 DIAGNOSIS — R41840 Attention and concentration deficit: Secondary | ICD-10-CM | POA: Diagnosis present

## 2020-03-11 DIAGNOSIS — M25611 Stiffness of right shoulder, not elsewhere classified: Secondary | ICD-10-CM | POA: Diagnosis present

## 2020-03-11 DIAGNOSIS — R414 Neurologic neglect syndrome: Secondary | ICD-10-CM | POA: Diagnosis present

## 2020-03-12 NOTE — Therapy (Signed)
[facility] [address] [place], [place], [address] Phone: [phone]   Fax:  [phone]  Physical Therapy Treatment  Patient Details  Name: Robert Mcconnell MRN: [id number] Date of Birth: 11-Sep-1921 Referring Provider (PT): Arnell Sieving   Encounter Date: 03/11/2020   PT End of Session - 03/12/20 1126    Visit Number 46    Number of Visits 45    Date for PT Re-Evaluation 04/29/20    Authorization Type VA, eval 09/10/41, recert 07/07/93; VA auth 15 visits from 01/08/20 to 05/07/20    Authorization Time Period 15 visits from 9/2 to 05/07/20    Authorization - Visit Number 15    Authorization - Number of Visits 15    Progress Note Due on Visit 15    Equipment Utilized During Treatment Gait belt    Activity Tolerance Patient tolerated treatment well    Behavior During Therapy [facility] for tasks assessed/performed           Past Medical History:  Diagnosis Date  . Atrial fibrillation ([place])   . BPH (benign prostatic hyperplasia)   . CKD (chronic kidney disease)   . History of coronary angioplasty with insertion of stent   . Hypertension   . Severe sepsis ([place]) 11/04/2019  . Stroke [facility])     Past Surgical History:  Procedure Laterality Date  . COLECTOMY  2006   cancer    There were no vitals filed for this visit.   Subjective Assessment - 03/12/20 1125    Subjective No new changes    Patient is accompained by: Family member    Pertinent History CVA    Limitations Standing;Walking;House hold activities;Sitting    How long can you sit comfortably? 1-2 hours    How long can you stand comfortably? <5 min    How long can you walk comfortably? unable    Patient Stated Goals walk                   Gait training: max A x 1-2 in parallel bars, max A to move R LE forward,intermittent tactile cues to shift weight to L during L swing phase, step to pattern with L LE, max A to turn 180 deg in parall bars 3  x 20 feet Sit to stand: 8 x during the session with max A with cues to reach forward, lean forward, trunk support and to lift up. Seated with board on rollers and pt asked to perform knee flexion and extension with isometric holds at 90 deg flexion of knee to improve hamstring activations: 20x AA, pt has mild activation of quads but no activation of hamstrings felt Standing kicking with R leg: 5x mod A with holding rail of parallel bars, kicking with L Leg max A due to buckling of R knee: 5x                            PT Short Term Goals - 01/13/20 1836      PT SHORT TERM GOAL #1   Title Patient will be able to perform sit to stand with mod A with or without AD to improve independence and reduce care giver burden    Baseline max A (eval); mod to max A (10/30/19), mox to max A (12/01/19); mod A with 1 UE support (01/13/20)    Time 4    Period Weeks    Status Achieved    Target  Date 11/27/19      PT SHORT TERM GOAL #2   Title pt will be able to stand with mod A for 5 min to perform small reaching with L UE to improve standing balance.    Baseline able to stand with holding on to parallel bars with L UE but needs stabilization at knees to improve standing tolerance (max A required overall) is able to reach with max A, requires mod to max A but unable to hold for 5 min while performing reaching. Requires mod to max A due to inconsistency (01/13/20)    Time 4    Period Weeks    Status On-going    Target Date 02/10/20      PT SHORT TERM GOAL #3   Title Pt will be able to propel chair with SBA  for 30 feet to improve household navigation    Baseline Able to propel chair with use of L UE and L LE with mod A for 30 feet (10/30/19); patient has power chair now (12/01/19)    Time 4    Period Weeks    Status Not Met    Target Date 10/06/19             PT Long Term Goals - 01/13/20 1838      PT LONG TERM GOAL #1   Title Patient will be able to stand for 5 min with CGA to  improve standing endurance with or without AD    Baseline requires max A    Time 8    Period Weeks    Status On-going    Target Date 03/09/20      PT LONG TERM GOAL #2   Title Patient will be able to perform chair to bed transfer using hemi walker and mod A to reduce caregiver burden    Baseline max A with stand pivot transfers    Time 8    Period Weeks    Status On-going    Target Date 03/09/20      PT LONG TERM GOAL #3   Title Pt will be able to ambulate 10 feet with hemi walker and mod to max A to improve in home mobility    Baseline total A with body weight support system    Time 8    Period Weeks    Status Revised      PT LONG TERM GOAL #4   Title Pt will demo >10/56 on BBS to improve static balance and reduce fall risk    Baseline BBS 4/56 (eval)    Time 8    Period Weeks    Status On-going    Target Date 03/09/20                 Plan - 03/12/20 1125    Clinical Impression Statement today's session was focused on continued progression of gait in parallel bars. Patient needs max A with ambulation. Able to turn little easier today. Needs max A with advancing L LE forward. Pt unable to transfer weight to left arm when stepping forward with L LE which results in buckling of R knee.    Personal Factors and Comorbidities Age;Comorbidity 3+    Comorbidities hx of stroke, hx of colorectal cancer    Examination-Activity Limitations Bathing;Bed Mobility;Caring for Others;Carry;Locomotion Level;Hygiene/Grooming;Dressing;Continence;Self Feeding;Reach Overhead;Sit;Lift;Other;Transfers;Toileting;Stand;Stairs    Rehab Potential Good    PT Frequency 2x / week    PT Duration 8 weeks    PT Treatment/Interventions ADLs/Self Care Home Management;Electrical  Stimulation;Gait training;Stair training;Functional mobility training;Therapeutic exercise;Balance training;Neuromuscular re-education;Therapeutic activities;Patient/family education;Wheelchair mobility training;Manual  techniques;Passive range of motion    PT Next Visit Plan Continue trial of knee immobilizer on R LE    Consulted and Agree with Plan of Care Patient;Family member/caregiver    Family Member Consulted daughter           Patient will benefit from skilled therapeutic intervention in order to improve the following deficits and impairments:  Abnormal gait, Decreased activity tolerance, Decreased coordination, Decreased safety awareness, Decreased strength, Impaired flexibility, Impaired UE functional use, Postural dysfunction, Impaired tone, Increased edema, Decreased range of motion, Decreased knowledge of precautions, Impaired sensation, Difficulty walking, Decreased mobility, Decreased endurance, Decreased balance  Visit Diagnosis: Hemiplegia and hemiparesis following cerebral infarction affecting right dominant side (HCC)  Muscle weakness (generalized)  Unsteadiness on feet  Other abnormalities of gait and mobility     Problem List Patient Active Problem List   Diagnosis Date Noted  . Dilated cardiomyopathy ([place]) 03/05/2020  . History of CVA (cerebrovascular accident) 03/05/2020  . Recurrent UTI 01/09/2020  . History of basal cell cancer 01/09/2020  . Chronic respiratory failure with hypoxia ([place]) 11/07/2019  . Pressure injury of skin 11/05/2019  . Acute renal failure superimposed on stage 4 chronic kidney disease ([place]) 11/04/2019  . Right hemiparesis ([place]) 11/04/2019  . Atrial fibrillation, chronic ([place]) 11/04/2019  . Dysphagia 11/04/2019  . 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 CKD (chronic kidney disease) stage 4, GFR 15-29 ml/min (HCC) 09/29/2019  . Thrombocytopenia ([place]) 09/29/2019  . Personal history of DVT (deep vein thrombosis) 09/09/2019  . History of ischemic stroke 09/09/2019  . CAD (coronary artery disease) 09/09/2019  . Chronic constipation 09/09/2019  . History of colorectal cancer 09/09/2019  . Facial paralysis on right side  09/09/2019  . Right arm weakness 09/09/2019  . Right leg weakness 09/09/2019  . Pseudobulbar affect 09/09/2019    Kerrie Pleasure 03/12/2020, 11:27 AM  [facility] [address] [place], [place], [address] Phone: [phone]   Fax:  [phone]  Name: Taj Nevins MRN: [id number] Date of Birth: 12/01/1921

## 2020-03-16 ENCOUNTER — Ambulatory Visit: Payer: No Typology Code available for payment source

## 2020-03-16 ENCOUNTER — Other Ambulatory Visit: Payer: Self-pay

## 2020-03-16 DIAGNOSIS — M6281 Muscle weakness (generalized): Secondary | ICD-10-CM

## 2020-03-16 DIAGNOSIS — R2681 Unsteadiness on feet: Secondary | ICD-10-CM

## 2020-03-16 DIAGNOSIS — I69351 Hemiplegia and hemiparesis following cerebral infarction affecting right dominant side: Secondary | ICD-10-CM | POA: Diagnosis not present

## 2020-03-16 DIAGNOSIS — R2689 Other abnormalities of gait and mobility: Secondary | ICD-10-CM

## 2020-03-16 NOTE — Therapy (Signed)
[facility] [address] [place], [place], [address] Phone: [phone]   Fax:  [phone]  Physical Therapy Treatment  Patient Details  Name: Robert Mcconnell MRN: [id number] Date of Birth: 1921-06-01 Referring Provider (PT): Arnell Sieving   Encounter Date: 03/16/2020   PT End of Session - 03/16/20 1733    Visit Number 47    Number of Visits 45    Date for PT Re-Evaluation 04/29/20    Authorization Type VA, eval 09/13/72, recert 05/13/36; VA auth 15 visits from 01/08/20 to 05/07/20    Authorization Time Period 15 visits from 9/2 to 05/07/20    Authorization - Visit Number 15    Authorization - Number of Visits 15    Progress Note Due on Visit 15    PT Start Time 1615    PT Stop Time 1700    PT Time Calculation (min) 45 min    Equipment Utilized During Treatment Gait belt    Activity Tolerance Patient tolerated treatment well    Behavior During Therapy [facility] for tasks assessed/performed           Past Medical History:  Diagnosis Date  . Atrial fibrillation ([place])   . BPH (benign prostatic hyperplasia)   . CKD (chronic kidney disease)   . History of coronary angioplasty with insertion of stent   . Hypertension   . Severe sepsis ([place]) 11/04/2019  . Stroke [facility])     Past Surgical History:  Procedure Laterality Date  . COLECTOMY  2006   cancer    There were no vitals filed for this visit.   Subjective Assessment - 03/16/20 1717    Subjective No new changes. They couldn't figure out a stretch of wall that they can install rail for them to practice walking at home.    Patient is accompained by: Family member    Pertinent History CVA    Limitations Standing;Walking;House hold activities;Sitting    How long can you sit comfortably? 1-2 hours    How long can you stand comfortably? <5 min    How long can you walk comfortably? unable    Patient Stated Goals walk                                        PT Short Term Goals - 01/13/20 1836      PT SHORT TERM GOAL #1   Title Patient will be able to perform sit to stand with mod A with or without AD to improve independence and reduce care giver burden    Baseline max A (eval); mod to max A (10/30/19), mox to max A (12/01/19); mod A with 1 UE support (01/13/20)    Time 4    Period Weeks    Status Achieved    Target Date 11/27/19      PT SHORT TERM GOAL #2   Title pt will be able to stand with mod A for 5 min to perform small reaching with L UE to improve standing balance.    Baseline able to stand with holding on to parallel bars with L UE but needs stabilization at knees to improve standing tolerance (max A required overall) is able to reach with max A, requires mod to max A but unable to hold for 5 min while performing reaching. Requires mod to max A due to inconsistency (01/13/20)    Time  4    Period Weeks    Status On-going    Target Date 02/10/20      PT SHORT TERM GOAL #3   Title Pt will be able to propel chair with SBA  for 30 feet to improve household navigation    Baseline Able to propel chair with use of L UE and L LE with mod A for 30 feet (10/30/19); patient has power chair now (12/01/19)    Time 4    Period Weeks    Status Not Met    Target Date 10/06/19             PT Long Term Goals - 01/13/20 1838      PT LONG TERM GOAL #1   Title Patient will be able to stand for 5 min with CGA to improve standing endurance with or without AD    Baseline requires max A    Time 8    Period Weeks    Status On-going    Target Date 03/09/20      PT LONG TERM GOAL #2   Title Patient will be able to perform chair to bed transfer using hemi walker and mod A to reduce caregiver burden    Baseline max A with stand pivot transfers    Time 8    Period Weeks    Status On-going    Target Date 03/09/20      PT LONG TERM GOAL #3   Title Pt will be able to ambulate 10 feet with hemi  walker and mod to max A to improve in home mobility    Baseline total A with body weight support system    Time 8    Period Weeks    Status Revised      PT LONG TERM GOAL #4   Title Pt will demo >10/56 on BBS to improve static balance and reduce fall risk    Baseline BBS 4/56 (eval)    Time 8    Period Weeks    Status On-going    Target Date 03/09/20                 Plan - 03/16/20 1718    Clinical Impression Statement Today's session was continued to be focused on gait training in parallel bars. We tried blue rocker reaction AFO on his R foot to reduce knee buckling. Pt demonstrated improved stability in R knee during R stance phase but still requires max A. Pt demo 5 x 20 feet with ambulation in parallel bar with 1 x 180 turn each time with max A    Personal Factors and Comorbidities Age;Comorbidity 3+    Comorbidities hx of stroke, hx of colorectal cancer    Examination-Activity Limitations Bathing;Bed Mobility;Caring for Others;Carry;Locomotion Level;Hygiene/Grooming;Dressing;Continence;Self Feeding;Reach Overhead;Sit;Lift;Other;Transfers;Toileting;Stand;Stairs    Rehab Potential Good    PT Frequency 2x / week    PT Duration 8 weeks    PT Treatment/Interventions ADLs/Self Care Home Management;Electrical Stimulation;Gait training;Stair training;Functional mobility training;Therapeutic exercise;Balance training;Neuromuscular re-education;Therapeutic activities;Patient/family education;Wheelchair mobility training;Manual techniques;Passive range of motion    PT Next Visit Plan Continue trial of knee immobilizer on R LE    Consulted and Agree with Plan of Care Patient;Family member/caregiver    Family Member Consulted daughter           Patient will benefit from skilled therapeutic intervention in order to improve the following deficits and impairments:  Abnormal gait, Decreased activity tolerance, Decreased coordination, Decreased safety awareness, Decreased strength,  Impaired  flexibility, Impaired UE functional use, Postural dysfunction, Impaired tone, Increased edema, Decreased range of motion, Decreased knowledge of precautions, Impaired sensation, Difficulty walking, Decreased mobility, Decreased endurance, Decreased balance  Visit Diagnosis: Hemiplegia and hemiparesis following cerebral infarction affecting right dominant side (HCC)  Muscle weakness (generalized)  Unsteadiness on feet  Other abnormalities of gait and mobility     Problem List Patient Active Problem List   Diagnosis Date Noted  . Dilated cardiomyopathy ([place]) 03/05/2020  . History of CVA (cerebrovascular accident) 03/05/2020  . Recurrent UTI 01/09/2020  . History of basal cell cancer 01/09/2020  . Chronic respiratory failure with hypoxia ([place]) 11/07/2019  . Pressure injury of skin 11/05/2019  . Acute renal failure superimposed on stage 4 chronic kidney disease ([place]) 11/04/2019  . Right hemiparesis ([place]) 11/04/2019  . Atrial fibrillation, chronic ([place]) 11/04/2019  . Dysphagia 11/04/2019  . 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 CKD (chronic kidney disease) stage 4, GFR 15-29 ml/min (HCC) 09/29/2019  . Thrombocytopenia ([place]) 09/29/2019  . Personal history of DVT (deep vein thrombosis) 09/09/2019  . History of ischemic stroke 09/09/2019  . CAD (coronary artery disease) 09/09/2019  . Chronic constipation 09/09/2019  . History of colorectal cancer 09/09/2019  . 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 Right arm weakness 09/09/2019  . Right leg weakness 09/09/2019  . Pseudobulbar affect 09/09/2019    Kerrie Pleasure 03/16/2020, 5:34 PM  [facility] [address] [place], [place], [address] Phone: [phone]   Fax:  [phone]  Name: Robert Mcconnell MRN: [id number] Date of Birth: 11/23/1921

## 2020-03-18 ENCOUNTER — Ambulatory Visit: Payer: No Typology Code available for payment source | Admitting: Occupational Therapy

## 2020-03-18 ENCOUNTER — Ambulatory Visit: Payer: No Typology Code available for payment source

## 2020-03-18 ENCOUNTER — Encounter: Payer: Self-pay | Admitting: Occupational Therapy

## 2020-03-18 ENCOUNTER — Other Ambulatory Visit: Payer: Self-pay

## 2020-03-18 DIAGNOSIS — M6281 Muscle weakness (generalized): Secondary | ICD-10-CM

## 2020-03-18 DIAGNOSIS — I69351 Hemiplegia and hemiparesis following cerebral infarction affecting right dominant side: Secondary | ICD-10-CM

## 2020-03-18 DIAGNOSIS — R600 Localized edema: Secondary | ICD-10-CM

## 2020-03-18 DIAGNOSIS — M25611 Stiffness of right shoulder, not elsewhere classified: Secondary | ICD-10-CM

## 2020-03-18 DIAGNOSIS — R2681 Unsteadiness on feet: Secondary | ICD-10-CM

## 2020-03-18 DIAGNOSIS — R41840 Attention and concentration deficit: Secondary | ICD-10-CM

## 2020-03-18 DIAGNOSIS — R414 Neurologic neglect syndrome: Secondary | ICD-10-CM

## 2020-03-18 DIAGNOSIS — R2689 Other abnormalities of gait and mobility: Secondary | ICD-10-CM

## 2020-03-18 NOTE — Therapy (Signed)
[facility] [address] [place], [place], [address] Phone: [phone]   Fax:  [phone]  Occupational Therapy Treatment  Patient Details  Name: Robert Mcconnell MRN: [id number] Date of Birth: 06-Nov-1921 Referring Provider (OT): Eliezer Lofts   Encounter Date: 03/18/2020   OT End of Session - 03/18/20 1844    Visit Number 29    Number of Visits 30    Date for OT Re-Evaluation 04/06/20    Authorization Type VA    Authorization - Visit Number 29    Progress Note Due on Visit 30    OT Start Time 1750    OT Stop Time 1828    OT Time Calculation (min) 38 min    Activity Tolerance Patient tolerated treatment well    Behavior During Therapy [facility] for tasks assessed/performed           Past Medical History:  Diagnosis Date  . Atrial fibrillation ([name])   . BPH (benign prostatic hyperplasia)   . CKD (chronic kidney disease)   . History of coronary angioplasty with insertion of stent   . Hypertension   . Severe sepsis ([name]) 11/04/2019  . Stroke [facility])     Past Surgical History:  Procedure Laterality Date  . COLECTOMY  2006   cancer    There were no vitals filed for this visit.   Subjective Assessment - 03/18/20 1838    Subjective  Patient reports being ready for therapy    Patient is accompanied by: Family member    Pain Score 0-No pain                        OT Treatments/Exercises (OP) - 03/18/20 0001      ADLs   Functional Mobility Functional mobility - worked on transition from sit to/from stand with use of front grab bar.  Patient on occasion able to stand with LUE assist, and maintain balance for several seconds while completing a familiar task.  Patient at times pulled to stand without activating RLE and then promptly lost balance - activity can be distracting.  Patient can become so engaged in task that he loses any sense of postural control.  Challenging as task gives patient a reason to  stand, and helps him to stand for greater periods of time - but not always effectively.                      OT Short Term Goals - 03/18/20 1847      OT SHORT TERM GOAL #1   Title Patient will complete lower body bathing with max assist due 6/19    Baseline total assist    Time 4    Period Weeks    Status Achieved    Target Date 10/25/19      OT SHORT TERM GOAL #2   Title Patient will bend forward while seated to pull pants up thighs    Baseline not able    Time 4    Period Weeks    Status Achieved      OT SHORT TERM GOAL #3   Title Patient will transition from sit to stand with mod assist in prep for tolieting on commode    Baseline total assist    Time 4    Period Weeks    Status Achieved      OT SHORT TERM GOAL #4   Title Patient/caregiver will don/doff custom RUE splint with  min cueing    Time 4    Period Weeks    Status Deferred             OT Long Term Goals - 03/18/20 1847      OT LONG TERM GOAL #1   Title Patient will transiton from sit to stand with mod assist to allow toileting, clothing management, and hygiene    Time 6    Period Weeks    Status Achieved      OT LONG TERM GOAL #2   Title Patient/caregiver will complete a home exercise program to prevent pain, and improve passive motion in RUE to aide with hygiene and dressing tasks    Baseline Patient with pain throughout RUE    Time 6    Period Weeks    Status Achieved      OT LONG TERM GOAL #3   Title Patient / caregiver will demonstrate effective edema management techniques for right hand, and support for right arm to prevent further subluxation    Time 6    Period Weeks    Status Achieved      OT LONG TERM GOAL #4   Title Patient will attend to right arm, and manage right arm during transitional movements and in wheelchair with min cueing    Time 6    Period Weeks    Status On-going      OT LONG TERM GOAL #5   Title Patient will stand with min assist for sufficient time that  caregiver can complete toilet hygiene and clothing management due 9/24    Status Achieved      OT LONG TERM GOAL #6   Title Patient will complete shower transfer into and out of tub with moderate assistance    Time 8    Period Weeks    Status Achieved                 Plan - 03/18/20 1845    Clinical Impression Statement Patient demonstrates improved standing balance, and stand tolerance as needed for ADL - however, due to severity of his impairments, he is not able to show consistent progress from one session to the next.  Significant progress has been evident over the course of his rehab. but progress tends to be slow.    OT Occupational Profile and History Detailed Assessment- Review of Records and additional review of physical, cognitive, psychosocial history related to current functional performance    Occupational performance deficits (Please refer to evaluation for details): ADL's;Rest and Sleep    Body Structure / Function / Physical Skills ADL;Coordination;Endurance;GMC;UE functional use;Balance;Decreased knowledge of precautions;Sensation;Pain;Flexibility;Body mechanics;Decreased knowledge of use of DME;FMC;Proprioception;Strength;Tone;ROM;Edema;Continence;Mobility    Cognitive Skills Attention;Emotional;Energy/Drive;Memory;Orientation;Perception;Problem Solve;Safety Awareness;Sequencing    Rehab Potential Fair    Clinical Decision Making Several treatment options, min-mod task modification necessary    Comorbidities Affecting Occupational Performance: May have comorbidities impacting occupational performance    Modification or Assistance to Complete Evaluation  Min-Moderate modification of tasks or assist with assess necessary to complete eval    OT Frequency 2x / week    OT Duration 6 weeks    OT Treatment/Interventions Self-care/ADL training;Electrical Stimulation;Therapeutic exercise;Visual/perceptual remediation/compensation;Patient/family education;Splinting;Neuromuscular  education;Therapeutic activities;Balance training;Cognitive remediation/compensation;Passive range of motion;Manual Therapy;DME and/or AE instruction    Plan Postural control, sitting balance, sit to lift off, weight shift right, lower body ADL weight shifts, NMR RUE    OT Home Exercise Plan Sliding right hand on thigh, tilted stool    Consulted and Agree  with Plan of Care Patient;Family member/caregiver    Family Member Consulted Daughter [name]           Patient will benefit from skilled therapeutic intervention in order to improve the following deficits and impairments:   Body Structure / Function / Physical Skills: ADL, Coordination, Endurance, GMC, UE functional use, Balance, Decreased knowledge of precautions, Sensation, Pain, Flexibility, Body mechanics, Decreased knowledge of use of DME, FMC, Proprioception, Strength, Tone, ROM, Edema, Continence, Mobility Cognitive Skills: Attention, Emotional, Energy/Drive, Memory, Orientation, Perception, Problem Solve, Safety Awareness, Sequencing     Visit Diagnosis: Hemiplegia and hemiparesis following cerebral infarction affecting right dominant side (HCC)  Muscle weakness (generalized)  Unsteadiness on feet  Neurologic neglect syndrome  Attention and concentration deficit  Stiffness of right shoulder, not elsewhere classified  Localized edema    Problem List Patient Active Problem List   Diagnosis Date Noted  . Dilated cardiomyopathy ([place]) 03/05/2020  . History of CVA (cerebrovascular accident) 03/05/2020  . Recurrent UTI 01/09/2020  . History of basal cell cancer 01/09/2020  . Chronic respiratory failure with hypoxia ([place]) 11/07/2019  . Pressure injury of skin 11/05/2019  . Acute renal failure superimposed on stage 4 chronic kidney disease ([place]) 11/04/2019  . Right hemiparesis ([place]) 11/04/2019  . Atrial fibrillation, chronic ([place]) 11/04/2019  . Dysphagia 11/04/2019  . 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 CKD (chronic kidney disease) stage 4, GFR 15-29 ml/min (HCC) 09/29/2019  . Thrombocytopenia ([place]) 09/29/2019  . Personal history of DVT (deep vein thrombosis) 09/09/2019  . History of ischemic stroke 09/09/2019  . CAD (coronary artery disease) 09/09/2019  . Chronic constipation 09/09/2019  . History of colorectal cancer 09/09/2019  . 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 Right arm weakness 09/09/2019  . Right leg weakness 09/09/2019  . Pseudobulbar affect 09/09/2019    Mariah Milling, OTR/L 03/18/2020, 6:49 PM  [facility] [address] [place], [place], [address] Phone: [phone]   Fax:  [phone]  Name: Shin Lamour MRN: [id number] Date of Birth: 1922-03-20

## 2020-03-18 NOTE — Therapy (Signed)
[facility] [address] [place], [place], [address] Phone: [phone]   Fax:  [phone]  Physical Therapy Treatment  Patient Details  Name: Robert Mcconnell MRN: [id number] Date of Birth: 08/11/1921 Referring Provider (PT): Arnell Sieving   Encounter Date: 03/18/2020   PT End of Session - 03/18/20 1809    Visit Number 48    Number of Visits 45    Date for PT Re-Evaluation 04/29/20    Authorization Type VA, eval 0/7/37, recert 1/0/62; VA auth 15 visits from 01/08/20 to 05/07/20; 15 visits from 03/02/20 to 07/01/19    Authorization Time Period 15 visits from 9/2 to 05/07/20    Authorization - Visit Number 5    Authorization - Number of Visits 15    Progress Note Due on Visit 15    PT Start Time 1645    PT Stop Time 1730    PT Time Calculation (min) 45 min    Equipment Utilized During Treatment Gait belt    Activity Tolerance Patient tolerated treatment well    Behavior During Therapy [facility] for tasks assessed/performed           Past Medical History:  Diagnosis Date  . Atrial fibrillation ([place])   . BPH (benign prostatic hyperplasia)   . CKD (chronic kidney disease)   . History of coronary angioplasty with insertion of stent   . Hypertension   . Severe sepsis ([place]) 11/04/2019  . Stroke [facility])     Past Surgical History:  Procedure Laterality Date  . COLECTOMY  2006   cancer    There were no vitals filed for this visit.   Subjective Assessment - 03/18/20 1753    Subjective no new complaints    Patient is accompained by: Family member    Pertinent History CVA    Limitations Standing;Walking;House hold activities;Sitting    How long can you sit comfortably? 1-2 hours    How long can you stand comfortably? <5 min    How long can you walk comfortably? unable    Patient Stated Goals walk    Currently in Pain? No/denies                  Gait trainin: max A x 2 in parallel bars: 2 x 20 feet  with 180 deg turns Chair to leg press transfer: max A: 1 Leg press: 60lbs 10x bil; 30lbs 2 x 10 R only with max A Fwd step up: 4" box: 20x R only                     PT Short Term Goals - 01/13/20 1836      PT SHORT TERM GOAL #1   Title Patient will be able to perform sit to stand with mod A with or without AD to improve independence and reduce care giver burden    Baseline max A (eval); mod to max A (10/30/19), mox to max A (12/01/19); mod A with 1 UE support (01/13/20)    Time 4    Period Weeks    Status Achieved    Target Date 11/27/19      PT SHORT TERM GOAL #2   Title pt will be able to stand with mod A for 5 min to perform small reaching with L UE to improve standing balance.    Baseline able to stand with holding on to parallel bars with L UE but needs stabilization at knees to improve standing tolerance (  max A required overall) is able to reach with max A, requires mod to max A but unable to hold for 5 min while performing reaching. Requires mod to max A due to inconsistency (01/13/20)    Time 4    Period Weeks    Status On-going    Target Date 02/10/20      PT SHORT TERM GOAL #3   Title Pt will be able to propel chair with SBA  for 30 feet to improve household navigation    Baseline Able to propel chair with use of L UE and L LE with mod A for 30 feet (10/30/19); patient has power chair now (12/01/19)    Time 4    Period Weeks    Status Not Met    Target Date 10/06/19             PT Long Term Goals - 01/13/20 1838      PT LONG TERM GOAL #1   Title Patient will be able to stand for 5 min with CGA to improve standing endurance with or without AD    Baseline requires max A    Time 8    Period Weeks    Status On-going    Target Date 03/09/20      PT LONG TERM GOAL #2   Title Patient will be able to perform chair to bed transfer using hemi walker and mod A to reduce caregiver burden    Baseline max A with stand pivot transfers    Time 8    Period Weeks     Status On-going    Target Date 03/09/20      PT LONG TERM GOAL #3   Title Pt will be able to ambulate 10 feet with hemi walker and mod to max A to improve in home mobility    Baseline total A with body weight support system    Time 8    Period Weeks    Status Revised      PT LONG TERM GOAL #4   Title Pt will demo >10/56 on BBS to improve static balance and reduce fall risk    Baseline BBS 4/56 (eval)    Time 8    Period Weeks    Status On-going    Target Date 03/09/20                 Plan - 03/18/20 1754    Clinical Impression Statement Today's session was continued to focus on gait training in parallel bars with GRAFO on R leg and heel lift. Patient had difficult time with putting weight throught R leg today and required more than usual max A x 2 today. Patient kept his weight throught ball of R foot and increased buckling was noted. Pt only performed 2 x 20 feet of walking. Pt needed mox A with leg press.    Personal Factors and Comorbidities Age;Comorbidity 3+    Comorbidities hx of stroke, hx of colorectal cancer    Examination-Activity Limitations Bathing;Bed Mobility;Caring for Others;Carry;Locomotion Level;Hygiene/Grooming;Dressing;Continence;Self Feeding;Reach Overhead;Sit;Lift;Other;Transfers;Toileting;Stand;Stairs    Rehab Potential Good    PT Frequency 2x / week    PT Duration 8 weeks    PT Treatment/Interventions ADLs/Self Care Home Management;Electrical Stimulation;Gait training;Stair training;Functional mobility training;Therapeutic exercise;Balance training;Neuromuscular re-education;Therapeutic activities;Patient/family education;Wheelchair mobility training;Manual techniques;Passive range of motion    PT Next Visit Plan Continue trial of knee immobilizer on R LE    Consulted and Agree with Plan of Care Patient;Family member/caregiver  Family Member Consulted daughter           Patient will benefit from skilled therapeutic intervention in order to  improve the following deficits and impairments:  Abnormal gait, Decreased activity tolerance, Decreased coordination, Decreased safety awareness, Decreased strength, Impaired flexibility, Impaired UE functional use, Postural dysfunction, Impaired tone, Increased edema, Decreased range of motion, Decreased knowledge of precautions, Impaired sensation, Difficulty walking, Decreased mobility, Decreased endurance, Decreased balance  Visit Diagnosis: Hemiplegia and hemiparesis following cerebral infarction affecting right dominant side (HCC)  Muscle weakness (generalized)  Unsteadiness on feet  Other abnormalities of gait and mobility     Problem List Patient Active Problem List   Diagnosis Date Noted  . Dilated cardiomyopathy ([place]) 03/05/2020  . History of CVA (cerebrovascular accident) 03/05/2020  . Recurrent UTI 01/09/2020  . History of basal cell cancer 01/09/2020  . Chronic respiratory failure with hypoxia ([place]) 11/07/2019  . Pressure injury of skin 11/05/2019  . Acute renal failure superimposed on stage 4 chronic kidney disease ([place]) 11/04/2019  . Right hemiparesis ([place]) 11/04/2019  . Atrial fibrillation, chronic ([place]) 11/04/2019  . Dysphagia 11/04/2019  . 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 CKD (chronic kidney disease) stage 4, GFR 15-29 ml/min (HCC) 09/29/2019  . Thrombocytopenia ([place]) 09/29/2019  . Personal history of DVT (deep vein thrombosis) 09/09/2019  . History of ischemic stroke 09/09/2019  . CAD (coronary artery disease) 09/09/2019  . Chronic constipation 09/09/2019  . History of colorectal cancer 09/09/2019  . 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 Right arm weakness 09/09/2019  . Right leg weakness 09/09/2019  . Pseudobulbar affect 09/09/2019    Kerrie Pleasure, PT 03/18/2020, 6:11 PM  [facility] [address] [place], [place], [address] Phone:  [phone]   Fax:  [phone]  Name: Robert Mcconnell MRN: [id number] Date of Birth: Nov 28, 1921

## 2020-03-23 ENCOUNTER — Ambulatory Visit: Payer: No Typology Code available for payment source

## 2020-03-23 ENCOUNTER — Other Ambulatory Visit: Payer: Self-pay

## 2020-03-23 DIAGNOSIS — I69351 Hemiplegia and hemiparesis following cerebral infarction affecting right dominant side: Secondary | ICD-10-CM | POA: Diagnosis not present

## 2020-03-23 DIAGNOSIS — R2689 Other abnormalities of gait and mobility: Secondary | ICD-10-CM

## 2020-03-23 DIAGNOSIS — R2681 Unsteadiness on feet: Secondary | ICD-10-CM

## 2020-03-23 DIAGNOSIS — M6281 Muscle weakness (generalized): Secondary | ICD-10-CM

## 2020-03-23 NOTE — Therapy (Signed)
[facility] [address] [place], [place], [address] Phone: [phone]   Fax:  [phone]  Physical Therapy Treatment  Patient Details  Name: Robert Mcconnell MRN: [id number] Date of Birth: 1922-03-06 Referring Provider (PT): Arnell Sieving   Encounter Date: 03/23/2020   PT End of Session - 03/23/20 1903    Visit Number 49    Number of Visits 45    Date for PT Re-Evaluation 04/29/20    Authorization Type VA, eval 5/0/09, recert 07/13/16; VA auth 15 visits from 01/08/20 to 05/07/20; 15 visits from 03/02/20 to 07/01/19    Authorization Time Period 15 visits from 9/2 to 05/07/20    Authorization - Visit Number 5    Authorization - Number of Visits 15    Progress Note Due on Visit 15    PT Start Time 1615    PT Stop Time 1700    PT Time Calculation (min) 45 min    Equipment Utilized During Treatment Gait belt    Activity Tolerance Patient tolerated treatment well    Behavior During Therapy [facility] for tasks assessed/performed           Past Medical History:  Diagnosis Date  . Atrial fibrillation ([place])   . BPH (benign prostatic hyperplasia)   . CKD (chronic kidney disease)   . History of coronary angioplasty with insertion of stent   . Hypertension   . Severe sepsis ([place]) 11/04/2019  . Stroke [facility])     Past Surgical History:  Procedure Laterality Date  . COLECTOMY  2006   cancer    There were no vitals filed for this visit.   Subjective Assessment - 03/23/20 1852    Subjective no new complaints    Patient is accompained by: Family member    Pertinent History CVA    Limitations Standing;Walking;House hold activities;Sitting    How long can you sit comfortably? 1-2 hours    How long can you stand comfortably? <5 min    How long can you walk comfortably? unable    Patient Stated Goals walk    Currently in Pain? No/denies                     Gait trainin: max A x 2 in parallel bars: 7 x 10  feet with 180 deg turns in standing 4x and 3x while sitting on stook Sit to stand transfer to and from power chair: 1x each, max A Sit to stand fro stool: mod A to maintain balance while standing                      PT Short Term Goals - 01/13/20 1836      PT SHORT TERM GOAL #1   Title Patient will be able to perform sit to stand with mod A with or without AD to improve independence and reduce care giver burden    Baseline max A (eval); mod to max A (10/30/19), mox to max A (12/01/19); mod A with 1 UE support (01/13/20)    Time 4    Period Weeks    Status Achieved    Target Date 11/27/19      PT SHORT TERM GOAL #2   Title pt will be able to stand with mod A for 5 min to perform small reaching with L UE to improve standing balance.    Baseline able to stand with holding on to parallel bars with L UE but  needs stabilization at knees to improve standing tolerance (max A required overall) is able to reach with max A, requires mod to max A but unable to hold for 5 min while performing reaching. Requires mod to max A due to inconsistency (01/13/20)    Time 4    Period Weeks    Status On-going    Target Date 02/10/20      PT SHORT TERM GOAL #3   Title Pt will be able to propel chair with SBA  for 30 feet to improve household navigation    Baseline Able to propel chair with use of L UE and L LE with mod A for 30 feet (10/30/19); patient has power chair now (12/01/19)    Time 4    Period Weeks    Status Not Met    Target Date 10/06/19             PT Long Term Goals - 01/13/20 1838      PT LONG TERM GOAL #1   Title Patient will be able to stand for 5 min with CGA to improve standing endurance with or without AD    Baseline requires max A    Time 8    Period Weeks    Status On-going    Target Date 03/09/20      PT LONG TERM GOAL #2   Title Patient will be able to perform chair to bed transfer using hemi walker and mod A to reduce caregiver burden    Baseline max A with  stand pivot transfers    Time 8    Period Weeks    Status On-going    Target Date 03/09/20      PT LONG TERM GOAL #3   Title Pt will be able to ambulate 10 feet with hemi walker and mod to max A to improve in home mobility    Baseline total A with body weight support system    Time 8    Period Weeks    Status Revised      PT LONG TERM GOAL #4   Title Pt will demo >10/56 on BBS to improve static balance and reduce fall risk    Baseline BBS 4/56 (eval)    Time 8    Period Weeks    Status On-going    Target Date 03/09/20                 Plan - 03/23/20 1902    Clinical Impression Statement Pt continues to have excessivelean to his R side with walking and hebuckles on R knee even with GRAFO and requires max A x 1. Pt needed lot of encouragement today to improve WB through his R leg to improve control and decrease reliance on PT's support during walking.    Personal Factors and Comorbidities Age;Comorbidity 3+    Comorbidities hx of stroke, hx of colorectal cancer    Examination-Activity Limitations Bathing;Bed Mobility;Caring for Others;Carry;Locomotion Level;Hygiene/Grooming;Dressing;Continence;Self Feeding;Reach Overhead;Sit;Lift;Other;Transfers;Toileting;Stand;Stairs    Rehab Potential Good    PT Frequency 2x / week    PT Duration 8 weeks    PT Treatment/Interventions ADLs/Self Care Home Management;Electrical Stimulation;Gait training;Stair training;Functional mobility training;Therapeutic exercise;Balance training;Neuromuscular re-education;Therapeutic activities;Patient/family education;Wheelchair mobility training;Manual techniques;Passive range of motion    PT Next Visit Plan Continue trial of knee immobilizer on R LE    Consulted and Agree with Plan of Care Patient;Family member/caregiver    Family Member Consulted daughter  Patient will benefit from skilled therapeutic intervention in order to improve the following deficits and impairments:  Abnormal gait,  Decreased activity tolerance, Decreased coordination, Decreased safety awareness, Decreased strength, Impaired flexibility, Impaired UE functional use, Postural dysfunction, Impaired tone, Increased edema, Decreased range of motion, Decreased knowledge of precautions, Impaired sensation, Difficulty walking, Decreased mobility, Decreased endurance, Decreased balance  Visit Diagnosis: Hemiplegia and hemiparesis following cerebral infarction affecting right dominant side (HCC)  Muscle weakness (generalized)  Unsteadiness on feet  Other abnormalities of gait and mobility     Problem List Patient Active Problem List   Diagnosis Date Noted  . Dilated cardiomyopathy ([place]) 03/05/2020  . History of CVA (cerebrovascular accident) 03/05/2020  . Recurrent UTI 01/09/2020  . History of basal cell cancer 01/09/2020  . Chronic respiratory failure with hypoxia ([place]) 11/07/2019  . Pressure injury of skin 11/05/2019  . Acute renal failure superimposed on stage 4 chronic kidney disease ([place]) 11/04/2019  . Right hemiparesis ([place]) 11/04/2019  . Atrial fibrillation, chronic ([place]) 11/04/2019  . Dysphagia 11/04/2019  . 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 CKD (chronic kidney disease) stage 4, GFR 15-29 ml/min (HCC) 09/29/2019  . Thrombocytopenia ([place]) 09/29/2019  . Personal history of DVT (deep vein thrombosis) 09/09/2019  . History of ischemic stroke 09/09/2019  . CAD (coronary artery disease) 09/09/2019  . Chronic constipation 09/09/2019  . History of colorectal cancer 09/09/2019  . 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 Right arm weakness 09/09/2019  . Right leg weakness 09/09/2019  . Pseudobulbar affect 09/09/2019    Kerrie Pleasure, PT 03/23/2020, 7:05 PM  [facility] [address] [place], [place], [address] Phone: [phone]   Fax:  [phone]  Name: Robert Mcconnell MRN:  [id number] Date of Birth: 02-01-1922

## 2020-03-30 ENCOUNTER — Ambulatory Visit: Payer: No Typology Code available for payment source

## 2020-03-30 ENCOUNTER — Other Ambulatory Visit: Payer: Self-pay

## 2020-03-30 DIAGNOSIS — R2681 Unsteadiness on feet: Secondary | ICD-10-CM

## 2020-03-30 DIAGNOSIS — I69351 Hemiplegia and hemiparesis following cerebral infarction affecting right dominant side: Secondary | ICD-10-CM | POA: Diagnosis not present

## 2020-03-30 DIAGNOSIS — M6281 Muscle weakness (generalized): Secondary | ICD-10-CM

## 2020-03-30 DIAGNOSIS — R2689 Other abnormalities of gait and mobility: Secondary | ICD-10-CM

## 2020-03-30 NOTE — Therapy (Signed)
[facility] [address] [place], [place], [address] Phone: [phone]   Fax:  [phone]  Physical Therapy Treatment  Patient Details  Name: Robert Mcconnell MRN: [id number] Date of Birth: 11-17-1921 Referring Provider (PT): Arnell Sieving   Encounter Date: 03/30/2020   PT End of Session - 03/30/20 1908    Visit Number 50    Number of Visits 45    Date for PT Re-Evaluation 04/29/20    Authorization Type VA, eval 10/14/60, recert 01/11/27; VA auth 15 visits from 01/08/20 to 05/07/20; 15 visits from 03/02/20 to 07/01/19    Authorization Time Period 15 visits from 9/2 to 05/07/20    Authorization - Visit Number 6    Authorization - Number of Visits 15    Progress Note Due on Visit 15    PT Start Time 1615    PT Stop Time 1700    PT Time Calculation (min) 45 min    Equipment Utilized During Treatment Gait belt    Activity Tolerance Patient tolerated treatment well    Behavior During Therapy [facility] for tasks assessed/performed           Past Medical History:  Diagnosis Date  . Atrial fibrillation ([place])   . BPH (benign prostatic hyperplasia)   . CKD (chronic kidney disease)   . History of coronary angioplasty with insertion of stent   . Hypertension   . Severe sepsis ([place]) 11/04/2019  . Stroke [facility])     Past Surgical History:  Procedure Laterality Date  . COLECTOMY  2006   cancer    There were no vitals filed for this visit.   Subjective Assessment - 03/30/20 1825    Subjective daughter reports his aid didn't come for last 2 days so she had to care for him while working from home. She reports that he does better at home with standing compared to in PT    Patient is accompained by: Family member    Pertinent History CVA    Limitations Standing;Walking;House hold activities;Sitting    How long can you sit comfortably? 1-2 hours    How long can you stand comfortably? <5 min    How long can you walk  comfortably? unable    Patient Stated Goals walk                  Gait training: 1 x 10 feet, 4 x 5 feet in parallel bar with max A x 2 Turning 180 deg while sitting on stool, max A with R LE 3x Turning 180 standing with parallel bars and max A x 2 Sit to stand: 5x from stool with L hand on shoulder with tactile cues to lean forward and use LE to stand up rather than relying on pulling with L UE. Daughter educated on how to properly cue and practice this at home with him.                     PT Short Term Goals - 01/13/20 1836      PT SHORT TERM GOAL #1   Title Patient will be able to perform sit to stand with mod A with or without AD to improve independence and reduce care giver burden    Baseline max A (eval); mod to max A (10/30/19), mox to max A (12/01/19); mod A with 1 UE support (01/13/20)    Time 4    Period Weeks    Status Achieved  Target Date 11/27/19      PT SHORT TERM GOAL #2   Title pt will be able to stand with mod A for 5 min to perform small reaching with L UE to improve standing balance.    Baseline able to stand with holding on to parallel bars with L UE but needs stabilization at knees to improve standing tolerance (max A required overall) is able to reach with max A, requires mod to max A but unable to hold for 5 min while performing reaching. Requires mod to max A due to inconsistency (01/13/20)    Time 4    Period Weeks    Status On-going    Target Date 02/10/20      PT SHORT TERM GOAL #3   Title Pt will be able to propel chair with SBA  for 30 feet to improve household navigation    Baseline Able to propel chair with use of L UE and L LE with mod A for 30 feet (10/30/19); patient has power chair now (12/01/19)    Time 4    Period Weeks    Status Not Met    Target Date 10/06/19             PT Long Term Goals - 01/13/20 1838      PT LONG TERM GOAL #1   Title Patient will be able to stand for 5 min with CGA to improve standing  endurance with or without AD    Baseline requires max A    Time 8    Period Weeks    Status On-going    Target Date 03/09/20      PT LONG TERM GOAL #2   Title Patient will be able to perform chair to bed transfer using hemi walker and mod A to reduce caregiver burden    Baseline max A with stand pivot transfers    Time 8    Period Weeks    Status On-going    Target Date 03/09/20      PT LONG TERM GOAL #3   Title Pt will be able to ambulate 10 feet with hemi walker and mod to max A to improve in home mobility    Baseline total A with body weight support system    Time 8    Period Weeks    Status Revised      PT LONG TERM GOAL #4   Title Pt will demo >10/56 on BBS to improve static balance and reduce fall risk    Baseline BBS 4/56 (eval)    Time 8    Period Weeks    Status On-going    Target Date 03/09/20                 Plan - 03/30/20 1857    Clinical Impression Statement Pt demo decreased walking endurance compared to previous sessions. Pt demo 1 x 10 feet and4 x 5 feet of walking in paralel bar with max A x 2. Pt needed increased encouragement to stand up tallerand longer and decrease reliance on UE support and use LE more for support with standing.    Personal Factors and Comorbidities Age;Comorbidity 3+    Comorbidities hx of stroke, hx of colorectal cancer    Examination-Activity Limitations Bathing;Bed Mobility;Caring for Others;Carry;Locomotion Level;Hygiene/Grooming;Dressing;Continence;Self Feeding;Reach Overhead;Sit;Lift;Other;Transfers;Toileting;Stand;Stairs    Rehab Potential Good    PT Frequency 2x / week    PT Duration 8 weeks    PT Treatment/Interventions ADLs/Self Care Home Management;Electrical  Stimulation;Gait training;Stair training;Functional mobility training;Therapeutic exercise;Balance training;Neuromuscular re-education;Therapeutic activities;Patient/family education;Wheelchair mobility training;Manual techniques;Passive range of motion    PT  Next Visit Plan Continue trial of knee immobilizer on R LE    Consulted and Agree with Plan of Care Patient;Family member/caregiver    Family Member Consulted daughter           Patient will benefit from skilled therapeutic intervention in order to improve the following deficits and impairments:  Abnormal gait, Decreased activity tolerance, Decreased coordination, Decreased safety awareness, Decreased strength, Impaired flexibility, Impaired UE functional use, Postural dysfunction, Impaired tone, Increased edema, Decreased range of motion, Decreased knowledge of precautions, Impaired sensation, Difficulty walking, Decreased mobility, Decreased endurance, Decreased balance  Visit Diagnosis: Hemiplegia and hemiparesis following cerebral infarction affecting right dominant side (HCC)  Muscle weakness (generalized)  Unsteadiness on feet  Other abnormalities of gait and mobility     Problem List Patient Active Problem List   Diagnosis Date Noted  . Dilated cardiomyopathy ([place]) 03/05/2020  . History of CVA (cerebrovascular accident) 03/05/2020  . Recurrent UTI 01/09/2020  . History of basal cell cancer 01/09/2020  . Chronic respiratory failure with hypoxia ([place]) 11/07/2019  . Pressure injury of skin 11/05/2019  . Acute renal failure superimposed on stage 4 chronic kidney disease ([place]) 11/04/2019  . Right hemiparesis ([place]) 11/04/2019  . Atrial fibrillation, chronic ([place]) 11/04/2019  . Dysphagia 11/04/2019  . 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 CKD (chronic kidney disease) stage 4, GFR 15-29 ml/min (HCC) 09/29/2019  . Thrombocytopenia ([place]) 09/29/2019  . Personal history of DVT (deep vein thrombosis) 09/09/2019  . History of ischemic stroke 09/09/2019  . CAD (coronary artery disease) 09/09/2019  . Chronic constipation 09/09/2019  . History of colorectal cancer 09/09/2019  . 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 Right arm weakness  09/09/2019  . Right leg weakness 09/09/2019  . Pseudobulbar affect 09/09/2019    Kerrie Pleasure, PT 03/30/2020, 7:09 PM  [facility] [address] [place], [place], [address] Phone: [phone]   Fax:  [phone]  Name: Jackob Crookston MRN: [id number] Date of Birth: Jul 02, 1921

## 2020-04-06 ENCOUNTER — Ambulatory Visit: Payer: No Typology Code available for payment source | Admitting: Occupational Therapy

## 2020-04-06 ENCOUNTER — Other Ambulatory Visit: Payer: Self-pay

## 2020-04-06 ENCOUNTER — Ambulatory Visit: Payer: No Typology Code available for payment source

## 2020-04-06 DIAGNOSIS — R2681 Unsteadiness on feet: Secondary | ICD-10-CM

## 2020-04-06 DIAGNOSIS — M6281 Muscle weakness (generalized): Secondary | ICD-10-CM

## 2020-04-06 DIAGNOSIS — R414 Neurologic neglect syndrome: Secondary | ICD-10-CM

## 2020-04-06 DIAGNOSIS — M25611 Stiffness of right shoulder, not elsewhere classified: Secondary | ICD-10-CM

## 2020-04-06 DIAGNOSIS — R2689 Other abnormalities of gait and mobility: Secondary | ICD-10-CM

## 2020-04-06 DIAGNOSIS — R41840 Attention and concentration deficit: Secondary | ICD-10-CM

## 2020-04-06 DIAGNOSIS — R600 Localized edema: Secondary | ICD-10-CM

## 2020-04-06 DIAGNOSIS — I69351 Hemiplegia and hemiparesis following cerebral infarction affecting right dominant side: Secondary | ICD-10-CM

## 2020-04-06 NOTE — Therapy (Signed)
[facility] [address] [place], [place], [address] Phone: [phone]   Fax:  [phone]  Occupational Therapy Treatment and Discharge Summary  Patient Details  Name: Robert Mcconnell MRN: [id number] Date of Birth: 11-24-1921 Referring Provider (OT): Eliezer Lofts   Encounter Date: 04/06/2020   OT End of Session - 04/06/20 1853    Visit Number 30    Number of Visits 30    Date for OT Re-Evaluation 04/06/20    Authorization Type VA    Authorization - Visit Number 57    Authorization - Number of Visits 30    Progress Note Due on Visit 30    OT Start Time 1705    OT Stop Time 1745    OT Time Calculation (min) 40 min    Activity Tolerance Patient tolerated treatment well    Behavior During Therapy [facility] for tasks assessed/performed           Past Medical History:  Diagnosis Date  . Atrial fibrillation ([place])   . BPH (benign prostatic hyperplasia)   . CKD (chronic kidney disease)   . History of coronary angioplasty with insertion of stent   . Hypertension   . Severe sepsis ([place]) 11/04/2019  . Stroke [facility])     Past Surgical History:  Procedure Laterality Date  . COLECTOMY  2006   cancer    There were no vitals filed for this visit.   Subjective Assessment - 04/06/20 1848    Subjective  I am miserable    Patient is accompanied by: Family member    Currently in Pain? No/denies    Pain Score 0-No pain                        OT Treatments/Exercises (OP) - 04/06/20 0001      ADLs   Functional Mobility Worked on sit to stand, stand to sit, and stand balance - static.  Patient able to pull to stand with bar directly in front of him.  Patient needed cueing for upright posture.  If support given for upright posture - patient became less active.  Patient with best response with minimal facilitation and support.  Patient requires assist for alignment of base of support - left foot searches for wide  left base, and then right leg with minimal contact with floor.  Patient does best when he has reason to stand - e.g. task to complete with clear beginnign and end component.      ADL Comments Patient has met all but one goal.  Discussed with daughter and she is thrilled with level of progress with ADL - sepcifically transferring and showering.                      OT Short Term Goals - 04/06/20 1854      OT SHORT TERM GOAL #1   Title Patient will complete lower body bathing with max assist due 6/19    Baseline total assist    Time 4    Period Weeks    Status Achieved    Target Date 10/25/19      OT SHORT TERM GOAL #2   Title Patient will bend forward while seated to pull pants up thighs    Baseline not able    Time 4    Period Weeks    Status Achieved      OT SHORT TERM GOAL #3   Title Patient will  transition from sit to stand with mod assist in prep for tolieting on commode    Baseline total assist    Time 4    Period Weeks    Status Achieved      OT SHORT TERM GOAL #4   Title Patient/caregiver will don/doff custom RUE splint with min cueing    Time 4    Period Weeks    Status Deferred             OT Long Term Goals - 04/06/20 1855      OT LONG TERM GOAL #1   Title Patient will transiton from sit to stand with mod assist to allow toileting, clothing management, and hygiene    Time 6    Period Weeks    Status Achieved      OT LONG TERM GOAL #2   Title Patient/caregiver will complete a home exercise program to prevent pain, and improve passive motion in RUE to aide with hygiene and dressing tasks    Baseline Patient with pain throughout RUE    Time 6    Period Weeks    Status Achieved      OT LONG TERM GOAL #3   Title Patient / caregiver will demonstrate effective edema management techniques for right hand, and support for right arm to prevent further subluxation    Time 6    Period Weeks    Status Achieved      OT LONG TERM GOAL #4   Title  Patient will attend to right arm, and manage right arm during transitional movements and in wheelchair with min cueing    Time 6    Period Weeks    Status Not Met      OT LONG TERM GOAL #5   Title Patient will stand with min assist for sufficient time that caregiver can complete toilet hygiene and clothing management due 9/24    Status Achieved      OT LONG TERM GOAL #6   Title Patient will complete shower transfer into and out of tub with moderate assistance    Time 8    Period Weeks    Status Achieved                 Plan - 04/06/20 1853    Clinical Impression Statement Patient has made progress over time and is requires less assistance from caregiver for basic self care skills and transfers.    OT Occupational Profile and History Detailed Assessment- Review of Records and additional review of physical, cognitive, psychosocial history related to current functional performance    Occupational performance deficits (Please refer to evaluation for details): ADL's;Rest and Sleep    Body Structure / Function / Physical Skills ADL;Coordination;Endurance;GMC;UE functional use;Balance;Decreased knowledge of precautions;Sensation;Pain;Flexibility;Body mechanics;Decreased knowledge of use of DME;FMC;Proprioception;Strength;Tone;ROM;Edema;Continence;Mobility    Cognitive Skills Attention;Emotional;Energy/Drive;Memory;Orientation;Perception;Problem Solve;Safety Awareness;Sequencing    Rehab Potential Fair    Clinical Decision Making Several treatment options, min-mod task modification necessary    Comorbidities Affecting Occupational Performance: May have comorbidities impacting occupational performance    Modification or Assistance to Complete Evaluation  Min-Moderate modification of tasks or assist with assess necessary to complete eval    OT Frequency 2x / week    OT Duration 6 weeks    OT Treatment/Interventions Self-care/ADL training;Electrical Stimulation;Therapeutic  exercise;Visual/perceptual remediation/compensation;Patient/family education;Splinting;Neuromuscular education;Therapeutic activities;Balance training;Cognitive remediation/compensation;Passive range of motion;Manual Therapy;DME and/or AE instruction    Plan discharge    OT Home Exercise Plan Sliding right hand on thigh, tilted  stool    Consulted and Agree with Plan of Care Patient;Family member/caregiver    Family Member Consulted Daughter [name]           Patient will benefit from skilled therapeutic intervention in order to improve the following deficits and impairments:   Body Structure / Function / Physical Skills: ADL, Coordination, Endurance, GMC, UE functional use, Balance, Decreased knowledge of precautions, Sensation, Pain, Flexibility, Body mechanics, Decreased knowledge of use of DME, FMC, Proprioception, Strength, Tone, ROM, Edema, Continence, Mobility Cognitive Skills: Attention, Emotional, Energy/Drive, Memory, Orientation, Perception, Problem Solve, Safety Awareness, Sequencing     Visit Diagnosis: Hemiplegia and hemiparesis following cerebral infarction affecting right dominant side (HCC)  Muscle weakness (generalized)  Unsteadiness on feet  Neurologic neglect syndrome  Attention and concentration deficit  Stiffness of right shoulder, not elsewhere classified  Localized edema    Problem List Patient Active Problem List   Diagnosis Date Noted  . Dilated cardiomyopathy ([place]) 03/05/2020  . History of CVA (cerebrovascular accident) 03/05/2020  . Recurrent UTI 01/09/2020  . History of basal cell cancer 01/09/2020  . Chronic respiratory failure with hypoxia ([place]) 11/07/2019  . Pressure injury of skin 11/05/2019  . Acute renal failure superimposed on stage 4 chronic kidney disease ([place]) 11/04/2019  . Right hemiparesis ([place]) 11/04/2019  . Atrial fibrillation, chronic ([place]) 11/04/2019  . Dysphagia 11/04/2019  . 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 CKD (chronic kidney disease) stage 4, GFR 15-29 ml/min (HCC) 09/29/2019  . Thrombocytopenia ([place]) 09/29/2019  . Personal history of DVT (deep vein thrombosis) 09/09/2019  . History of ischemic stroke 09/09/2019  . CAD (coronary artery disease) 09/09/2019  . Chronic constipation 09/09/2019  . History of colorectal cancer 09/09/2019  . 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 Right arm weakness 09/09/2019  . Right leg weakness 09/09/2019  . Pseudobulbar affect 09/09/2019  OCCUPATIONAL THERAPY DISCHARGE SUMMARY  Visits from Start of Care: 30  Current functional level related to goals / functional outcomes: Now able to transfer bed to wheelchair with supervision, and into out of shower with mod assist of one caregiver   Remaining deficits: Dense right hemiplegia, right neglect, severe sensory perceptual deficits, significant attentional deficits  Education / Equipment: Intense caregiver training  Plan: Patient agrees to discharge.  Patient goals were met. Patient is being discharged due to meeting the stated rehab goals.  ?????       Mariah Milling, OTR/L 04/06/2020, 6:56 PM  [facility] [address] [place], [place], [address] Phone: [phone]   Fax:  [phone]  Name: Robert Mcconnell MRN: [id number] Date of Birth: [birth year]

## 2020-04-06 NOTE — Therapy (Signed)
[facility] [address] [place], [place], [address] Phone: [phone]   Fax:  [phone]  Physical Therapy Treatment  Patient Details  Name: Robert Mcconnell MRN: [id number] Date of Birth: [birth year] Referring Provider (PT): Arnell Sieving   Encounter Date: 04/06/2020   PT End of Session - 04/06/20 1904    Visit Number 51    Number of Visits 45    Date for PT Re-Evaluation 04/29/20    Authorization Type VA, eval 8/0/16, recert 09/10/35; VA auth 15 visits from 01/08/20 to 05/07/20; 15 visits from 03/02/20 to 07/01/19    Authorization Time Period 15 visits from 9/2 to 05/07/20    Authorization - Visit Number 7    Authorization - Number of Visits 15    Progress Note Due on Visit 15    PT Start Time 1615    PT Stop Time 1700    PT Time Calculation (min) 45 min    Equipment Utilized During Treatment Gait belt    Activity Tolerance Patient tolerated treatment well    Behavior During Therapy [facility] for tasks assessed/performed           Past Medical History:  Diagnosis Date  . Atrial fibrillation ([place])   . BPH (benign prostatic hyperplasia)   . CKD (chronic kidney disease)   . History of coronary angioplasty with insertion of stent   . Hypertension   . Severe sepsis ([place]) 11/04/2019  . Stroke [facility])     Past Surgical History:  Procedure Laterality Date  . COLECTOMY  2006   cancer    There were no vitals filed for this visit.   Subjective Assessment - 04/06/20 1902    Subjective no new changes    Patient is accompained by: Family member    Pertinent History CVA    Limitations Standing;Walking;House hold activities;Sitting    How long can you sit comfortably? 1-2 hours    How long can you stand comfortably? <5 min    How long can you walk comfortably? unable    Patient Stated Goals walk                Sit to stand with side of the steps with taller railing to improve more upright posture: 5 x  30" with min to mod A due to excessive leaning with one UE support   Sit to stand: 7x with use of one rail of parallel bars and min to mod A  Seated unilateral row: 15lbs AA: 10x Seated unilateral row with single handle: 10x Seated unilateral lat pull down: 15lbs 10x, AA                         PT Short Term Goals - 01/13/20 1836      PT SHORT TERM GOAL #1   Title Patient will be able to perform sit to stand with mod A with or without AD to improve independence and reduce care giver burden    Baseline max A (eval); mod to max A (10/30/19), mox to max A (12/01/19); mod A with 1 UE support (01/13/20)    Time 4    Period Weeks    Status Achieved    Target Date 11/27/19      PT SHORT TERM GOAL #2   Title pt will be able to stand with mod A for 5 min to perform small reaching with L UE to improve standing balance.    Baseline  able to stand with holding on to parallel bars with L UE but needs stabilization at knees to improve standing tolerance (max A required overall) is able to reach with max A, requires mod to max A but unable to hold for 5 min while performing reaching. Requires mod to max A due to inconsistency (01/13/20)    Time 4    Period Weeks    Status On-going    Target Date 02/10/20      PT SHORT TERM GOAL #3   Title Pt will be able to propel chair with SBA  for 30 feet to improve household navigation    Baseline Able to propel chair with use of L UE and L LE with mod A for 30 feet (10/30/19); patient has power chair now (12/01/19)    Time 4    Period Weeks    Status Not Met    Target Date 10/06/19             PT Long Term Goals - 01/13/20 1838      PT LONG TERM GOAL #1   Title Patient will be able to stand for 5 min with CGA to improve standing endurance with or without AD    Baseline requires max A    Time 8    Period Weeks    Status On-going    Target Date 03/09/20      PT LONG TERM GOAL #2   Title Patient will be able to perform chair to bed  transfer using hemi walker and mod A to reduce caregiver burden    Baseline max A with stand pivot transfers    Time 8    Period Weeks    Status On-going    Target Date 03/09/20      PT LONG TERM GOAL #3   Title Pt will be able to ambulate 10 feet with hemi walker and mod to max A to improve in home mobility    Baseline total A with body weight support system    Time 8    Period Weeks    Status Revised      PT LONG TERM GOAL #4   Title Pt will demo >10/56 on BBS to improve static balance and reduce fall risk    Baseline BBS 4/56 (eval)    Time 8    Period Weeks    Status On-going    Target Date 03/09/20                 Plan - 04/06/20 1903    Clinical Impression Statement Today's session was focused on performing sit to stand and standing and improving weight shift on L LE to improve stability and reduce UE support. Pt was little groggy during the session and BP was slightly lower at 95/60 which may be causing his grogginess.    Personal Factors and Comorbidities Age;Comorbidity 3+    Comorbidities hx of stroke, hx of colorectal cancer    Examination-Activity Limitations Bathing;Bed Mobility;Caring for Others;Carry;Locomotion Level;Hygiene/Grooming;Dressing;Continence;Self Feeding;Reach Overhead;Sit;Lift;Other;Transfers;Toileting;Stand;Stairs    Rehab Potential Good    PT Frequency 2x / week    PT Duration 8 weeks    PT Treatment/Interventions ADLs/Self Care Home Management;Electrical Stimulation;Gait training;Stair training;Functional mobility training;Therapeutic exercise;Balance training;Neuromuscular re-education;Therapeutic activities;Patient/family education;Wheelchair mobility training;Manual techniques;Passive range of motion    PT Next Visit Plan Continue trial of knee immobilizer on R LE    Consulted and Agree with Plan of Care Patient;Family member/caregiver    Family Member Consulted daughter  Patient will benefit from skilled therapeutic  intervention in order to improve the following deficits and impairments:  Abnormal gait, Decreased activity tolerance, Decreased coordination, Decreased safety awareness, Decreased strength, Impaired flexibility, Impaired UE functional use, Postural dysfunction, Impaired tone, Increased edema, Decreased range of motion, Decreased knowledge of precautions, Impaired sensation, Difficulty walking, Decreased mobility, Decreased endurance, Decreased balance  Visit Diagnosis: Unsteadiness on feet  Other abnormalities of gait and mobility  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 Dilated cardiomyopathy ([place]) 03/05/2020  . History of CVA (cerebrovascular accident) 03/05/2020  . Recurrent UTI 01/09/2020  . History of basal cell cancer 01/09/2020  . Chronic respiratory failure with hypoxia ([place]) 11/07/2019  . Pressure injury of skin 11/05/2019  . Acute renal failure superimposed on stage 4 chronic kidney disease ([place]) 11/04/2019  . Right hemiparesis ([place]) 11/04/2019  . Atrial fibrillation, chronic ([place]) 11/04/2019  . Dysphagia 11/04/2019  . 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 CKD (chronic kidney disease) stage 4, GFR 15-29 ml/min (HCC) 09/29/2019  . Thrombocytopenia ([place]) 09/29/2019  . Personal history of DVT (deep vein thrombosis) 09/09/2019  . History of ischemic stroke 09/09/2019  . CAD (coronary artery disease) 09/09/2019  . Chronic constipation 09/09/2019  . History of colorectal cancer 09/09/2019  . 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 Right arm weakness 09/09/2019  . Right leg weakness 09/09/2019  . Pseudobulbar affect 09/09/2019    Kerrie Pleasure 04/06/2020, 7:08 PM  [facility] [address] [place], [place],  [address] Phone: [phone]   Fax:  [phone]  Name: Robert Mcconnell MRN: [id number] Date of Birth: Jun 08, 1921

## 2020-04-08 ENCOUNTER — Ambulatory Visit: Payer: No Typology Code available for payment source | Attending: Physician Assistant

## 2020-04-08 ENCOUNTER — Other Ambulatory Visit: Payer: Self-pay

## 2020-04-08 DIAGNOSIS — R2681 Unsteadiness on feet: Secondary | ICD-10-CM | POA: Insufficient documentation

## 2020-04-08 DIAGNOSIS — R2689 Other abnormalities of gait and mobility: Secondary | ICD-10-CM | POA: Diagnosis present

## 2020-04-08 DIAGNOSIS — I69351 Hemiplegia and hemiparesis following cerebral infarction affecting right dominant side: Secondary | ICD-10-CM | POA: Diagnosis not present

## 2020-04-08 DIAGNOSIS — M6281 Muscle weakness (generalized): Secondary | ICD-10-CM | POA: Insufficient documentation

## 2020-04-08 NOTE — Therapy (Signed)
[facility] [address] [place], [place], [address] Phone: [phone]   Fax:  [phone]  Physical Therapy Treatment  Patient Details  Name: Robert Mcconnell MRN: [id number] Date of Birth: 07/01/1921 Referring Provider (PT): Arnell Sieving   Encounter Date: 04/08/2020   PT End of Session - 04/08/20 1910    Visit Number 52    Number of Visits 45    Date for PT Re-Evaluation 04/29/20    Authorization Type VA, eval 10/07/81, recert 10/12/27; VA auth 15 visits from 01/08/20 to 05/07/20; 15 visits from 03/02/20 to 07/01/19    Authorization Time Period 15 visits from 9/2 to 05/07/20    Authorization - Visit Number 8    Authorization - Number of Visits 15    Progress Note Due on Visit 15    PT Start Time 1615    PT Stop Time 1700    PT Time Calculation (min) 45 min    Equipment Utilized During Treatment Gait belt    Activity Tolerance Patient tolerated treatment well    Behavior During Therapy [facility] for tasks assessed/performed           Past Medical History:  Diagnosis Date  . Atrial fibrillation ([place])   . BPH (benign prostatic hyperplasia)   . CKD (chronic kidney disease)   . History of coronary angioplasty with insertion of stent   . Hypertension   . Severe sepsis ([place]) 11/04/2019  . Stroke [facility])     Past Surgical History:  Procedure Laterality Date  . COLECTOMY  2006   cancer    There were no vitals filed for this visit.   Subjective Assessment - 04/08/20 1908    Subjective daughter reports that he has been doing better in last couple of days and she has been working hard with him to stand and transfer at home.    Patient is accompained by: Family member    Pertinent History CVA    Limitations Standing;Walking;House hold activities;Sitting    How long can you sit comfortably? 1-2 hours    How long can you stand comfortably? <5 min    How long can you walk comfortably? unable    Patient Stated Goals  walk                  Gait training: 8 x 10' with bil hands on Parallel bar, required max A to maintain R hand on parallel bars, max  Ax 1 with ambulation to move R LE and to support body due to moderate buckling in R knee intermittently. Pt sat down at end of 10 feet walking on stool and turned 180 deg with max A with R LE and R UE.                     PT Short Term Goals - 01/13/20 1836      PT SHORT TERM GOAL #1   Title Patient will be able to perform sit to stand with mod A with or without AD to improve independence and reduce care giver burden    Baseline max A (eval); mod to max A (10/30/19), mox to max A (12/01/19); mod A with 1 UE support (01/13/20)    Time 4    Period Weeks    Status Achieved    Target Date 11/27/19      PT SHORT TERM GOAL #2   Title pt will be able to stand with mod A for 5  min to perform small reaching with L UE to improve standing balance.    Baseline able to stand with holding on to parallel bars with L UE but needs stabilization at knees to improve standing tolerance (max A required overall) is able to reach with max A, requires mod to max A but unable to hold for 5 min while performing reaching. Requires mod to max A due to inconsistency (01/13/20)    Time 4    Period Weeks    Status On-going    Target Date 02/10/20      PT SHORT TERM GOAL #3   Title Pt will be able to propel chair with SBA  for 30 feet to improve household navigation    Baseline Able to propel chair with use of L UE and L LE with mod A for 30 feet (10/30/19); patient has power chair now (12/01/19)    Time 4    Period Weeks    Status Not Met    Target Date 10/06/19             PT Long Term Goals - 01/13/20 1838      PT LONG TERM GOAL #1   Title Patient will be able to stand for 5 min with CGA to improve standing endurance with or without AD    Baseline requires max A    Time 8    Period Weeks    Status On-going    Target Date 03/09/20      PT LONG TERM  GOAL #2   Title Patient will be able to perform chair to bed transfer using hemi walker and mod A to reduce caregiver burden    Baseline max A with stand pivot transfers    Time 8    Period Weeks    Status On-going    Target Date 03/09/20      PT LONG TERM GOAL #3   Title Pt will be able to ambulate 10 feet with hemi walker and mod to max A to improve in home mobility    Baseline total A with body weight support system    Time 8    Period Weeks    Status Revised      PT LONG TERM GOAL #4   Title Pt will demo >10/56 on BBS to improve static balance and reduce fall risk    Baseline BBS 4/56 (eval)    Time 8    Period Weeks    Status On-going    Target Date 03/09/20                 Plan - 04/08/20 1909    Clinical Impression Statement Pt demo improved ability to swing R lEg forward today. He was able to slide his R foot about 1-2 inch 4-5x during the session. He still has moderate buckling on R LE and requires mod to max A. patient required max X 1 instead of max  A x 2 usually. Pt also demo improved walking endurance comapred t previous sessions .    Personal Factors and Comorbidities Age;Comorbidity 3+    Comorbidities hx of stroke, hx of colorectal cancer    Examination-Activity Limitations Bathing;Bed Mobility;Caring for Others;Carry;Locomotion Level;Hygiene/Grooming;Dressing;Continence;Self Feeding;Reach Overhead;Sit;Lift;Other;Transfers;Toileting;Stand;Stairs    Rehab Potential Good    PT Frequency 2x / week    PT Duration 8 weeks    PT Treatment/Interventions ADLs/Self Care Home Management;Electrical Stimulation;Gait training;Stair training;Functional mobility training;Therapeutic exercise;Balance training;Neuromuscular re-education;Therapeutic activities;Patient/family education;Wheelchair mobility training;Manual techniques;Passive range of motion  PT Next Visit Plan Continue trial of knee immobilizer on R LE    Consulted and Agree with Plan of Care Patient;Family  member/caregiver    Family Member Consulted daughter           Patient will benefit from skilled therapeutic intervention in order to improve the following deficits and impairments:  Abnormal gait, Decreased activity tolerance, Decreased coordination, Decreased safety awareness, Decreased strength, Impaired flexibility, Impaired UE functional use, Postural dysfunction, Impaired tone, Increased edema, Decreased range of motion, Decreased knowledge of precautions, Impaired sensation, Difficulty walking, Decreased mobility, Decreased endurance, Decreased balance  Visit Diagnosis: Hemiplegia and hemiparesis following cerebral infarction affecting right dominant side (HCC)  Muscle weakness (generalized)  Unsteadiness on feet  Other abnormalities of gait and mobility     Problem List Patient Active Problem List   Diagnosis Date Noted  . Dilated cardiomyopathy ([place]) 03/05/2020  . History of CVA (cerebrovascular accident) 03/05/2020  . Recurrent UTI 01/09/2020  . History of basal cell cancer 01/09/2020  . Chronic respiratory failure with hypoxia ([place]) 11/07/2019  . Pressure injury of skin 11/05/2019  . Acute renal failure superimposed on stage 4 chronic kidney disease ([place]) 11/04/2019  . Right hemiparesis ([place]) 11/04/2019  . Atrial fibrillation, chronic ([place]) 11/04/2019  . Dysphagia 11/04/2019  . 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 CKD (chronic kidney disease) stage 4, GFR 15-29 ml/min (HCC) 09/29/2019  . Thrombocytopenia ([place]) 09/29/2019  . Personal history of DVT (deep vein thrombosis) 09/09/2019  . History of ischemic stroke 09/09/2019  . CAD (coronary artery disease) 09/09/2019  . Chronic constipation 09/09/2019  . History of colorectal cancer 09/09/2019  . 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 Right arm weakness 09/09/2019  . Right leg weakness 09/09/2019  . Pseudobulbar affect 09/09/2019    Kerrie Pleasure, PT 04/08/2020, 7:11  PM  [facility] [address] [place], [place], [address] Phone: [phone]   Fax:  [phone]  Name: Robert Mcconnell MRN: [id number] Date of Birth: [birth year]

## 2020-04-13 ENCOUNTER — Ambulatory Visit: Payer: No Typology Code available for payment source

## 2020-04-13 ENCOUNTER — Other Ambulatory Visit: Payer: Self-pay

## 2020-04-13 DIAGNOSIS — R2681 Unsteadiness on feet: Secondary | ICD-10-CM

## 2020-04-13 DIAGNOSIS — M6281 Muscle weakness (generalized): Secondary | ICD-10-CM

## 2020-04-13 DIAGNOSIS — I69351 Hemiplegia and hemiparesis following cerebral infarction affecting right dominant side: Secondary | ICD-10-CM | POA: Diagnosis not present

## 2020-04-13 DIAGNOSIS — R2689 Other abnormalities of gait and mobility: Secondary | ICD-10-CM

## 2020-04-13 NOTE — Therapy (Signed)
[facility] [address] [place], [place], [address] Phone: [phone]   Fax:  [phone]  Physical Therapy Treatment  Patient Details  Name: Robert Mcconnell MRN: [id number] Date of Birth: 06/18/1921 Referring Provider (PT): Arnell Sieving   Encounter Date: 04/13/2020   PT End of Session - 04/13/20 1858    Visit Number 53    Number of Visits 45    Date for PT Re-Evaluation 04/29/20    Authorization Type VA, eval 9/0/30, recert 0/9/23; VA auth 15 visits from 01/08/20 to 05/07/20; 15 visits from 03/02/20 to 07/01/19    Authorization Time Period 15 visits from 9/2 to 05/07/20    Authorization - Visit Number 9    Authorization - Number of Visits 15    Progress Note Due on Visit 15    PT Start Time 1615    PT Stop Time 1700    PT Time Calculation (min) 45 min    Equipment Utilized During Treatment Gait belt    Activity Tolerance Patient tolerated treatment well    Behavior During Therapy [facility] for tasks assessed/performed           Past Medical History:  Diagnosis Date  . Atrial fibrillation ([place])   . BPH (benign prostatic hyperplasia)   . CKD (chronic kidney disease)   . History of coronary angioplasty with insertion of stent   . Hypertension   . Severe sepsis ([employer]) 11/04/2019  . Stroke [facility])     Past Surgical History:  Procedure Laterality Date  . COLECTOMY  2006   cancer    There were no vitals filed for this visit.   Subjective Assessment - 04/13/20 1855    Subjective Daughter reports they have been working on trasnfers and standing and more cognitive tasks. Daughter reports slowly improving cognition and speech.    Patient is accompained by: Family member    Pertinent History CVA    Limitations Standing;Walking;House hold activities;Sitting    How long can you sit comfortably? 1-2 hours    How long can you stand comfortably? <5 min    How long can you walk comfortably? unable    Patient Stated  Goals walk                     Gait training: 10 x 10' with bil hands on Parallel bar, required mox to max A to maintain R hand on parallel bars, mod to max  Ax 1 with ambulation to move R LE, plastic bag put on foot to ease sliding.                         PT Short Term Goals - 01/13/20 1836      PT SHORT TERM GOAL #1   Title Patient will be able to perform sit to stand with mod A with or without AD to improve independence and reduce care giver burden    Baseline max A (eval); mod to max A (10/30/19), mox to max A (12/01/19); mod A with 1 UE support (01/13/20)    Time 4    Period Weeks    Status Achieved    Target Date 11/27/19      PT SHORT TERM GOAL #2   Title pt will be able to stand with mod A for 5 min to perform small reaching with L UE to improve standing balance.    Baseline able to stand with holding on to  parallel bars with L UE but needs stabilization at knees to improve standing tolerance (max A required overall) is able to reach with max A, requires mod to max A but unable to hold for 5 min while performing reaching. Requires mod to max A due to inconsistency (01/13/20)    Time 4    Period Weeks    Status On-going    Target Date 02/10/20      PT SHORT TERM GOAL #3   Title Pt will be able to propel chair with SBA  for 30 feet to improve household navigation    Baseline Able to propel chair with use of L UE and L LE with mod A for 30 feet (10/30/19); patient has power chair now (12/01/19)    Time 4    Period Weeks    Status Not Met    Target Date 10/06/19             PT Long Term Goals - 01/13/20 1838      PT LONG TERM GOAL #1   Title Patient will be able to stand for 5 min with CGA to improve standing endurance with or without AD    Baseline requires max A    Time 8    Period Weeks    Status On-going    Target Date 03/09/20      PT LONG TERM GOAL #2   Title Patient will be able to perform chair to bed transfer using hemi walker and  mod A to reduce caregiver burden    Baseline max A with stand pivot transfers    Time 8    Period Weeks    Status On-going    Target Date 03/09/20      PT LONG TERM GOAL #3   Title Pt will be able to ambulate 10 feet with hemi walker and mod to max A to improve in home mobility    Baseline total A with body weight support system    Time 8    Period Weeks    Status Revised      PT LONG TERM GOAL #4   Title Pt will demo >10/56 on BBS to improve static balance and reduce fall risk    Baseline BBS 4/56 (eval)    Time 8    Period Weeks    Status On-going    Target Date 03/09/20                 Plan - 04/13/20 1856    Clinical Impression Statement With improving cognition, patient is starting demonstrate improved walking and trasnfer skills. Pt ambulated 10 x 10 feet with mod to max A still required to move R UE forward on parallel bars and advance R LE forward. We put on plastic bag on R foot to improve sliding and pt able to slide leg from extended position to neutral but unable to advance it in front of body and requires assistance.    Personal Factors and Comorbidities Age;Comorbidity 3+    Comorbidities hx of stroke, hx of colorectal cancer    Examination-Activity Limitations Bathing;Bed Mobility;Caring for Others;Carry;Locomotion Level;Hygiene/Grooming;Dressing;Continence;Self Feeding;Reach Overhead;Sit;Lift;Other;Transfers;Toileting;Stand;Stairs    Rehab Potential Good    PT Frequency 2x / week    PT Duration 8 weeks    PT Treatment/Interventions ADLs/Self Care Home Management;Electrical Stimulation;Gait training;Stair training;Functional mobility training;Therapeutic exercise;Balance training;Neuromuscular re-education;Therapeutic activities;Patient/family education;Wheelchair mobility training;Manual techniques;Passive range of motion    PT Next Visit Plan Continue trial of knee immobilizer on R  LE    Consulted and Agree with Plan of Care Patient;Family member/caregiver     Family Member Consulted daughter           Patient will benefit from skilled therapeutic intervention in order to improve the following deficits and impairments:  Abnormal gait, Decreased activity tolerance, Decreased coordination, Decreased safety awareness, Decreased strength, Impaired flexibility, Impaired UE functional use, Postural dysfunction, Impaired tone, Increased edema, Decreased range of motion, Decreased knowledge of precautions, Impaired sensation, Difficulty walking, Decreased mobility, Decreased endurance, Decreased balance  Visit Diagnosis: Hemiplegia and hemiparesis following cerebral infarction affecting right dominant side (HCC)  Muscle weakness (generalized)  Unsteadiness on feet  Other abnormalities of gait and mobility     Problem List Patient Active Problem List   Diagnosis Date Noted  . Dilated cardiomyopathy ([place]) 03/05/2020  . History of CVA (cerebrovascular accident) 03/05/2020  . Recurrent UTI 01/09/2020  . History of basal cell cancer 01/09/2020  . Chronic respiratory failure with hypoxia ([place]) 11/07/2019  . Pressure injury of skin 11/05/2019  . Acute renal failure superimposed on stage 4 chronic kidney disease ([place]) 11/04/2019  . Right hemiparesis ([place]) 11/04/2019  . Atrial fibrillation, chronic ([place]) 11/04/2019  . Dysphagia 11/04/2019  . 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 CKD (chronic kidney disease) stage 4, GFR 15-29 ml/min (HCC) 09/29/2019  . Thrombocytopenia ([place]) 09/29/2019  . Personal history of DVT (deep vein thrombosis) 09/09/2019  . History of ischemic stroke 09/09/2019  . CAD (coronary artery disease) 09/09/2019  . Chronic constipation 09/09/2019  . History of colorectal cancer 09/09/2019  . 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 Right arm weakness 09/09/2019  . Right leg weakness 09/09/2019  . Pseudobulbar affect 09/09/2019    Kerrie Pleasure, PT 04/13/2020, 7:00 PM  [facility] [address] [place], [place], [address] Phone: [phone]   Fax:  [phone]  Name: Robert Mcconnell MRN: [id number] Date of Birth: 02-01-1922

## 2020-04-15 ENCOUNTER — Other Ambulatory Visit: Payer: Self-pay

## 2020-04-15 ENCOUNTER — Ambulatory Visit: Payer: No Typology Code available for payment source

## 2020-04-15 DIAGNOSIS — I69351 Hemiplegia and hemiparesis following cerebral infarction affecting right dominant side: Secondary | ICD-10-CM

## 2020-04-15 DIAGNOSIS — R2689 Other abnormalities of gait and mobility: Secondary | ICD-10-CM

## 2020-04-15 DIAGNOSIS — R2681 Unsteadiness on feet: Secondary | ICD-10-CM

## 2020-04-15 DIAGNOSIS — M6281 Muscle weakness (generalized): Secondary | ICD-10-CM

## 2020-04-15 NOTE — Therapy (Signed)
[facility] [address] [place], [place], [address] Phone: [phone]   Fax:  [phone]  Physical Therapy Treatment  Patient Details  Name: Robert Mcconnell MRN: [id number] Date of Birth: [birth year] Referring Provider (PT): Arnell Sieving   Encounter Date: 04/15/2020   PT End of Session - 04/15/20 1911    Visit Number 75    Number of Visits 45    Date for PT Re-Evaluation 04/29/20    Authorization Type VA, eval 11/07/07, recert 08/13/07; VA auth 15 visits from 01/08/20 to 05/07/20; 15 visits from 03/02/20 to 07/01/19    Authorization Time Period 15 visits from 9/2 to 05/07/20    Authorization - Visit Number 10    Authorization - Number of Visits 15    Progress Note Due on Visit 15    PT Start Time 1615    PT Stop Time 1700    PT Time Calculation (min) 45 min    Equipment Utilized During Treatment Gait belt    Activity Tolerance Patient tolerated treatment well    Behavior During Therapy [facility] for tasks assessed/performed           Past Medical History:  Diagnosis Date  . Atrial fibrillation ([place])   . BPH (benign prostatic hyperplasia)   . CKD (chronic kidney disease)   . History of coronary angioplasty with insertion of stent   . Hypertension   . Severe sepsis ([place]) 11/04/2019  . Stroke [facility])     Past Surgical History:  Procedure Laterality Date  . COLECTOMY  2006   cancer    There were no vitals filed for this visit.   Subjective Assessment - 04/15/20 1908    Subjective Daughter reports that since yesterday, his voice has changed. Voince has gotten deeper and there is more clarity in his sounds.    Patient is accompained by: Family member    Pertinent History CVA    Limitations Standing;Walking;House hold activities;Sitting    How long can you sit comfortably? 1-2 hours    How long can you stand comfortably? <5 min    How long can you walk comfortably? unable    Patient Stated Goals walk                 Gait training: 1 x 20', 3 x 5' with RW and Max A x 1 with stabilizing R UE on walker handle and sliding R LE Forward, pt had increased lean towards his R side  Chair to mat transfer: max A with RW x 2  Lateral scooting at edge of mat: 3 feet to R and 3 feet to L, mod A with L scooting, max A with R scooting.                       PT Short Term Goals - 01/13/20 1836      PT SHORT TERM GOAL #1   Title Patient will be able to perform sit to stand with mod A with or without AD to improve independence and reduce care giver burden    Baseline max A (eval); mod to max A (10/30/19), mox to max A (12/01/19); mod A with 1 UE support (01/13/20)    Time 4    Period Weeks    Status Achieved    Target Date 11/27/19      PT SHORT TERM GOAL #2   Title pt will be able to stand with mod A for 5 min  to perform small reaching with L UE to improve standing balance.    Baseline able to stand with holding on to parallel bars with L UE but needs stabilization at knees to improve standing tolerance (max A required overall) is able to reach with max A, requires mod to max A but unable to hold for 5 min while performing reaching. Requires mod to max A due to inconsistency (01/13/20)    Time 4    Period Weeks    Status On-going    Target Date 02/10/20      PT SHORT TERM GOAL #3   Title Pt will be able to propel chair with SBA  for 30 feet to improve household navigation    Baseline Able to propel chair with use of L UE and L LE with mod A for 30 feet (10/30/19); patient has power chair now (12/01/19)    Time 4    Period Weeks    Status Not Met    Target Date 10/06/19             PT Long Term Goals - 01/13/20 1838      PT LONG TERM GOAL #1   Title Patient will be able to stand for 5 min with CGA to improve standing endurance with or without AD    Baseline requires max A    Time 8    Period Weeks    Status On-going    Target Date 03/09/20      PT LONG TERM GOAL #2    Title Patient will be able to perform chair to bed transfer using hemi walker and mod A to reduce caregiver burden    Baseline max A with stand pivot transfers    Time 8    Period Weeks    Status On-going    Target Date 03/09/20      PT LONG TERM GOAL #3   Title Pt will be able to ambulate 10 feet with hemi walker and mod to max A to improve in home mobility    Baseline total A with body weight support system    Time 8    Period Weeks    Status Revised      PT LONG TERM GOAL #4   Title Pt will demo >10/56 on BBS to improve static balance and reduce fall risk    Baseline BBS 4/56 (eval)    Time 8    Period Weeks    Status On-going    Target Date 03/09/20                 Plan - 04/15/20 1909    Clinical Impression Statement Pt had significant changes in his voice and speech today as it was more understabndable. Patient was progressed from parallel bars to using RW with hemi grip on R side. Patient did demonstrate icnreased lean to R with walker compared to walking in parallel bar but he was able to ambulate with max  Ax 1 with walker. Patient had easier time performing lateral seated scooting to his left compared to his R. We also worked on chair to chair transrer with walker and max A    Personal Factors and Comorbidities Age;Comorbidity 3+    Comorbidities hx of stroke, hx of colorectal cancer    Examination-Activity Limitations Bathing;Bed Mobility;Caring for Others;Carry;Locomotion Level;Hygiene/Grooming;Dressing;Continence;Self Feeding;Reach Overhead;Sit;Lift;Other;Transfers;Toileting;Stand;Stairs    Rehab Potential Good    PT Frequency 2x / week    PT Duration 8 weeks  PT Treatment/Interventions ADLs/Self Care Home Management;Electrical Stimulation;Gait training;Stair training;Functional mobility training;Therapeutic exercise;Balance training;Neuromuscular re-education;Therapeutic activities;Patient/family education;Wheelchair mobility training;Manual techniques;Passive  range of motion    PT Next Visit Plan Continue trial of knee immobilizer on R LE    Consulted and Agree with Plan of Care Patient;Family member/caregiver    Family Member Consulted daughter           Patient will benefit from skilled therapeutic intervention in order to improve the following deficits and impairments:  Abnormal gait,Decreased activity tolerance,Decreased coordination,Decreased safety awareness,Decreased strength,Impaired flexibility,Impaired UE functional use,Postural dysfunction,Impaired tone,Increased edema,Decreased range of motion,Decreased knowledge of precautions,Impaired sensation,Difficulty walking,Decreased mobility,Decreased endurance,Decreased balance  Visit Diagnosis: Hemiplegia and hemiparesis following cerebral infarction affecting right dominant side (HCC)  Muscle weakness (generalized)  Unsteadiness on feet  Other abnormalities of gait and mobility     Problem List Patient Active Problem List   Diagnosis Date Noted  . Dilated cardiomyopathy ([place]) 03/05/2020  . History of CVA (cerebrovascular accident) 03/05/2020  . Recurrent UTI 01/09/2020  . History of basal cell cancer 01/09/2020  . Chronic respiratory failure with hypoxia ([place]) 11/07/2019  . Pressure injury of skin 11/05/2019  . Acute renal failure superimposed on stage 4 chronic kidney disease ([place]) 11/04/2019  . Right hemiparesis ([place]) 11/04/2019  . Atrial fibrillation, chronic ([place]) 11/04/2019  . Dysphagia 11/04/2019  . 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 CKD (chronic kidney disease) stage 4, GFR 15-29 ml/min (HCC) 09/29/2019  . Thrombocytopenia ([place]) 09/29/2019  . Personal history of DVT (deep vein thrombosis) 09/09/2019  . History of ischemic stroke 09/09/2019  . CAD (coronary artery disease) 09/09/2019  . Chronic constipation 09/09/2019  . History of colorectal cancer 09/09/2019  . 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 Right arm weakness  09/09/2019  . Right leg weakness 09/09/2019  . Pseudobulbar affect 09/09/2019    Kerrie Pleasure, PT 04/15/2020, 7:15 PM  [facility] [address] [place], [place], [address] Phone: [phone]   Fax:  [phone]  Name: Jamaurion Slemmer MRN: [id number] Date of Birth: 02/09/1922

## 2020-04-20 ENCOUNTER — Ambulatory Visit: Payer: No Typology Code available for payment source

## 2020-04-20 ENCOUNTER — Other Ambulatory Visit: Payer: Self-pay

## 2020-04-20 DIAGNOSIS — M6281 Muscle weakness (generalized): Secondary | ICD-10-CM

## 2020-04-20 DIAGNOSIS — I69351 Hemiplegia and hemiparesis following cerebral infarction affecting right dominant side: Secondary | ICD-10-CM | POA: Diagnosis not present

## 2020-04-20 DIAGNOSIS — R2681 Unsteadiness on feet: Secondary | ICD-10-CM

## 2020-04-20 DIAGNOSIS — R2689 Other abnormalities of gait and mobility: Secondary | ICD-10-CM

## 2020-04-20 NOTE — Therapy (Signed)
[facility] [address] [place], [place], [address] Phone: [phone]   Fax:  [phone]  Physical Therapy Treatment  Patient Details  Name: Robert Mcconnell MRN: [id number] Date of Birth: 1921-09-20 Referring Provider (PT): Arnell Sieving   Encounter Date: 04/20/2020   PT End of Session - 04/20/20 1734    Visit Number 55    Number of Visits 44    Date for PT Re-Evaluation 04/29/20    Authorization Type VA, eval 11/12/91, recert 9/0/30; VA auth 15 visits from 01/08/20 to 05/07/20; 15 visits from 03/02/20 to 07/01/19    Authorization Time Period 15 visits from 9/2 to 05/07/20    Authorization - Visit Number 11    Authorization - Number of Visits 15    Progress Note Due on Visit 15    PT Start Time 1615    PT Stop Time 1700    PT Time Calculation (min) 45 min    Equipment Utilized During Treatment Gait belt    Activity Tolerance Patient tolerated treatment well    Behavior During Therapy [facility] for tasks assessed/performed           Past Medical History:  Diagnosis Date   Atrial fibrillation ([place])    BPH (benign prostatic hyperplasia)    CKD (chronic kidney disease)    History of coronary angioplasty with insertion of stent    Hypertension    Severe sepsis ([place]) 11/04/2019   Stroke [facility])     Past Surgical History:  Procedure Laterality Date   COLECTOMY  2006   cancer    There were no vitals filed for this visit.   Subjective Assessment - 04/20/20 1732    Subjective No new symptoms to report.    Patient is accompained by: Family member    Pertinent History CVA    Limitations Standing;Walking;House hold activities;Sitting    How long can you sit comfortably? 1-2 hours    How long can you stand comfortably? <5 min    How long can you walk comfortably? unable    Patient Stated Goals walk                Gait training:1 x 10' with RW and max A today to assist with R UE on walker and to  move R UE forward. Pt had excessive lean to R today  Sit to stand using flat surface of counter in front from tall stool: mod A required: 10x total thorughout the session  Practiced standing at counter and placing cones from counter to 1st shelf and bringing them down. Mod to max A due to excessive R lean, pt used L UE to transfer cones, pt given cues for specific colors and pt able to identify correct colors.  Pulling the cones off while sitting: 3x with R UE (seated)  Sliding R UE forward on countertop: mod  A: 10x (seated)                       PT Short Term Goals - 01/13/20 1836      PT SHORT TERM GOAL #1   Title Patient will be able to perform sit to stand with mod A with or without AD to improve independence and reduce care giver burden    Baseline max A (eval); mod to max A (10/30/19), mox to max A (12/01/19); mod A with 1 UE support (01/13/20)    Time 4    Period Weeks  Status Achieved    Target Date 11/27/19      PT SHORT TERM GOAL #2   Title pt will be able to stand with mod A for 5 min to perform small reaching with L UE to improve standing balance.    Baseline able to stand with holding on to parallel bars with L UE but needs stabilization at knees to improve standing tolerance (max A required overall) is able to reach with max A, requires mod to max A but unable to hold for 5 min while performing reaching. Requires mod to max A due to inconsistency (01/13/20)    Time 4    Period Weeks    Status On-going    Target Date 02/10/20      PT SHORT TERM GOAL #3   Title Pt will be able to propel chair with SBA  for 30 feet to improve household navigation    Baseline Able to propel chair with use of L UE and L LE with mod A for 30 feet (10/30/19); patient has power chair now (12/01/19)    Time 4    Period Weeks    Status Not Met    Target Date 10/06/19             PT Long Term Goals - 01/13/20 1838      PT LONG TERM GOAL #1   Title Patient will be able to  stand for 5 min with CGA to improve standing endurance with or without AD    Baseline requires max A    Time 8    Period Weeks    Status On-going    Target Date 03/09/20      PT LONG TERM GOAL #2   Title Patient will be able to perform chair to bed transfer using hemi walker and mod A to reduce caregiver burden    Baseline max A with stand pivot transfers    Time 8    Period Weeks    Status On-going    Target Date 03/09/20      PT LONG TERM GOAL #3   Title Pt will be able to ambulate 10 feet with hemi walker and mod to max A to improve in home mobility    Baseline total A with body weight support system    Time 8    Period Weeks    Status Revised      PT LONG TERM GOAL #4   Title Pt will demo >10/56 on BBS to improve static balance and reduce fall risk    Baseline BBS 4/56 (eval)    Time 8    Period Weeks    Status On-going    Target Date 03/09/20                 Plan - 04/20/20 1733    Clinical Impression Statement Pt did not do as well with walking today. We focused today's session on standing at countertop with flat surface where patient cann't hold and pull with Left arm to maintain balance. THis was also to promote pushing down with L UE to improve WB through L UE and L LE. Patient had excessive lean to R today and not reaction or sway to counter excessive lean.    Personal Factors and Comorbidities Age;Comorbidity 3+    Comorbidities hx of stroke, hx of colorectal cancer    Examination-Activity Limitations Bathing;Bed Mobility;Caring for Others;Carry;Locomotion Level;Hygiene/Grooming;Dressing;Continence;Self Feeding;Reach Overhead;Sit;Lift;Other;Transfers;Toileting;Stand;Stairs    Rehab Potential Good    PT  Frequency 2x / week    PT Duration 8 weeks    PT Treatment/Interventions ADLs/Self Care Home Management;Electrical Stimulation;Gait training;Stair training;Functional mobility training;Therapeutic exercise;Balance training;Neuromuscular re-education;Therapeutic  activities;Patient/family education;Wheelchair mobility training;Manual techniques;Passive range of motion    PT Next Visit Plan Continue trial of knee immobilizer on R LE    Consulted and Agree with Plan of Care Patient;Family member/caregiver    Family Member Consulted daughter           Patient will benefit from skilled therapeutic intervention in order to improve the following deficits and impairments:  Abnormal gait,Decreased activity tolerance,Decreased coordination,Decreased safety awareness,Decreased strength,Impaired flexibility,Impaired UE functional use,Postural dysfunction,Impaired tone,Increased edema,Decreased range of motion,Decreased knowledge of precautions,Impaired sensation,Difficulty walking,Decreased mobility,Decreased endurance,Decreased balance  Visit Diagnosis: Hemiplegia and hemiparesis following cerebral infarction affecting right dominant side (HCC)  Muscle weakness (generalized)  Unsteadiness on feet  Other abnormalities of gait and mobility     Problem List Patient Active Problem List   Diagnosis Date Noted   Dilated cardiomyopathy ([place]) 03/05/2020   History of CVA (cerebrovascular accident) 03/05/2020   Recurrent UTI 01/09/2020   History of basal cell cancer 01/09/2020   Chronic respiratory failure with hypoxia ([place]) 11/07/2019   Pressure injury of skin 11/05/2019   Acute renal failure superimposed on stage 4 chronic kidney disease ([place]) 11/04/2019   Right hemiparesis ([place]) 11/04/2019   Atrial fibrillation, chronic ([place]) 11/04/2019   Dysphagia 11/04/2019   Dysphagia as late effect of cerebrovascular accident (CVA) 11/03/2019   CKD (chronic kidney disease) stage 4, GFR 15-29 ml/min ([place]) 09/29/2019   Thrombocytopenia ([place]) 09/29/2019   Personal history of DVT (deep vein thrombosis) 09/09/2019   History of ischemic stroke 09/09/2019   CAD (coronary artery disease) 09/09/2019   Chronic constipation 09/09/2019   History of  colorectal cancer 09/09/2019   Facial paralysis on right side 09/09/2019   Right arm weakness 09/09/2019   Right leg weakness 09/09/2019   Pseudobulbar affect 09/09/2019    Kerrie Pleasure, PT 04/20/2020, 5:41 PM  [facility] [address] [place], [place], [address] Phone: [phone]   Fax:  [phone]  Name: Robert Mcconnell MRN: [id number] Date of Birth: 23-Aug-1921

## 2020-04-22 ENCOUNTER — Ambulatory Visit: Payer: No Typology Code available for payment source

## 2020-04-22 ENCOUNTER — Other Ambulatory Visit: Payer: Self-pay

## 2020-04-22 DIAGNOSIS — M6281 Muscle weakness (generalized): Secondary | ICD-10-CM

## 2020-04-22 DIAGNOSIS — I69351 Hemiplegia and hemiparesis following cerebral infarction affecting right dominant side: Secondary | ICD-10-CM

## 2020-04-22 DIAGNOSIS — R2689 Other abnormalities of gait and mobility: Secondary | ICD-10-CM

## 2020-04-22 DIAGNOSIS — R2681 Unsteadiness on feet: Secondary | ICD-10-CM

## 2020-04-22 NOTE — Therapy (Signed)
[facility] [address] [place], [place], [address] Phone: [phone]   Fax:  [phone]  Physical Therapy Treatment  Patient Details  Name: Robert Mcconnell MRN: [id number] Date of Birth: 1921-11-04 Referring Provider (PT): Arnell Sieving   Encounter Date: 04/22/2020   PT End of Session - 04/22/20 1858    Visit Number 57    Number of Visits 47    Date for PT Re-Evaluation 04/29/20    Authorization Type VA, eval 06/10/32, recert 07/11/66; VA auth 15 visits from 01/08/20 to 05/07/20; 15 visits from 03/02/20 to 07/01/19    Authorization Time Period 15 visits from 9/2 to 05/07/20    Authorization - Visit Number 12    Authorization - Number of Visits 15    Progress Note Due on Visit 15    PT Start Time 1615    PT Stop Time 1700    PT Time Calculation (min) 45 min    Equipment Utilized During Treatment Gait belt    Activity Tolerance Patient tolerated treatment well    Behavior During Therapy [facility] for tasks assessed/performed           Past Medical History:  Diagnosis Date   Atrial fibrillation ([place])    BPH (benign prostatic hyperplasia)    CKD (chronic kidney disease)    History of coronary angioplasty with insertion of stent    Hypertension    Severe sepsis ([place]) 11/04/2019   Stroke [facility])     Past Surgical History:  Procedure Laterality Date   COLECTOMY  2006   cancer    There were no vitals filed for this visit.   Subjective Assessment - 04/22/20 1854    Subjective They have been practicing with standing at countertop with flat surface and he was able to stand for up to 5 min at home with aide.    Patient is accompained by: Family member    Pertinent History CVA    Limitations Standing;Walking;House hold activities;Sitting    How long can you sit comfortably? 1-2 hours    How long can you stand comfortably? <5 min    How long can you walk comfortably? unable    Patient Stated Goals walk                 Sit to stand at countertop: mod A with cues to lean forward nose over toes: 7x during session Standing at countertop: 2' with bil UE support, mod to max A, PT keeping R UE open and secure on countertop passively Standing at coutnertop with putting pegs in board: 15 pegs, pt went from tall standing to R elbow on countertop with mod-max A for standing balance Standing at countertop: pulling and pushing large cones: 3x Seated: pulling the drawer out and pushing drawerin with R UE: 10x Seated pulling drawer out with R UE and putting cones insede with L UE: 3x, needed moderate cueing for directions Seated taking R UE off the handle of drawer with L hand: 3x, putting it back on : 2x Standing and taking a picture of sanitizer bottle in bottom shelve: to promote erect posture: 1'                       PT Short Term Goals - 01/13/20 1836      PT SHORT TERM GOAL #1   Title Patient will be able to perform sit to stand with mod A with or without AD to improve independence  and reduce care giver burden    Baseline max A (eval); mod to max A (10/30/19), mox to max A (12/01/19); mod A with 1 UE support (01/13/20)    Time 4    Period Weeks    Status Achieved    Target Date 11/27/19      PT SHORT TERM GOAL #2   Title pt will be able to stand with mod A for 5 min to perform small reaching with L UE to improve standing balance.    Baseline able to stand with holding on to parallel bars with L UE but needs stabilization at knees to improve standing tolerance (max A required overall) is able to reach with max A, requires mod to max A but unable to hold for 5 min while performing reaching. Requires mod to max A due to inconsistency (01/13/20)    Time 4    Period Weeks    Status On-going    Target Date 02/10/20      PT SHORT TERM GOAL #3   Title Pt will be able to propel chair with SBA  for 30 feet to improve household navigation    Baseline Able to propel chair with use of L  UE and L LE with mod A for 30 feet (10/30/19); patient has power chair now (12/01/19)    Time 4    Period Weeks    Status Not Met    Target Date 10/06/19             PT Long Term Goals - 01/13/20 1838      PT LONG TERM GOAL #1   Title Patient will be able to stand for 5 min with CGA to improve standing endurance with or without AD    Baseline requires max A    Time 8    Period Weeks    Status On-going    Target Date 03/09/20      PT LONG TERM GOAL #2   Title Patient will be able to perform chair to bed transfer using hemi walker and mod A to reduce caregiver burden    Baseline max A with stand pivot transfers    Time 8    Period Weeks    Status On-going    Target Date 03/09/20      PT LONG TERM GOAL #3   Title Pt will be able to ambulate 10 feet with hemi walker and mod to max A to improve in home mobility    Baseline total A with body weight support system    Time 8    Period Weeks    Status Revised      PT LONG TERM GOAL #4   Title Pt will demo >10/56 on BBS to improve static balance and reduce fall risk    Baseline BBS 4/56 (eval)    Time 8    Period Weeks    Status On-going    Target Date 03/09/20                 Plan - 04/22/20 1857    Clinical Impression Statement Pt demonstrated improved standing tolerance at countertop with flat surface today compared to previous sessions. We focused on manipulating objects while standing and side stepping. Patient needs mod to max A at countertop with standing. Pt is able to perform more simpler tasks with commands.    Personal Factors and Comorbidities Age;Comorbidity 3+    Comorbidities hx of stroke, hx of colorectal cancer  Examination-Activity Limitations Bathing;Bed Mobility;Caring for Others;Carry;Locomotion Level;Hygiene/Grooming;Dressing;Continence;Self Feeding;Reach Overhead;Sit;Lift;Other;Transfers;Toileting;Stand;Stairs    Rehab Potential Good    PT Frequency 2x / week    PT Duration 8 weeks    PT  Treatment/Interventions ADLs/Self Care Home Management;Electrical Stimulation;Gait training;Stair training;Functional mobility training;Therapeutic exercise;Balance training;Neuromuscular re-education;Therapeutic activities;Patient/family education;Wheelchair mobility training;Manual techniques;Passive range of motion    PT Next Visit Plan Continue trial of knee immobilizer on R LE    Consulted and Agree with Plan of Care Patient;Family member/caregiver    Family Member Consulted daughter           Patient will benefit from skilled therapeutic intervention in order to improve the following deficits and impairments:  Abnormal gait,Decreased activity tolerance,Decreased coordination,Decreased safety awareness,Decreased strength,Impaired flexibility,Impaired UE functional use,Postural dysfunction,Impaired tone,Increased edema,Decreased range of motion,Decreased knowledge of precautions,Impaired sensation,Difficulty walking,Decreased mobility,Decreased endurance,Decreased balance  Visit Diagnosis: Hemiplegia and hemiparesis following cerebral infarction affecting right dominant side (HCC)  Muscle weakness (generalized)  Unsteadiness on feet  Other abnormalities of gait and mobility     Problem List Patient Active Problem List   Diagnosis Date Noted   Dilated cardiomyopathy ([place]) 03/05/2020   History of CVA (cerebrovascular accident) 03/05/2020   Recurrent UTI 01/09/2020   History of basal cell cancer 01/09/2020   Chronic respiratory failure with hypoxia ([place]) 11/07/2019   Pressure injury of skin 11/05/2019   Acute renal failure superimposed on stage 4 chronic kidney disease ([place]) 11/04/2019   Right hemiparesis ([place]) 11/04/2019   Atrial fibrillation, chronic ([place]) 11/04/2019   Dysphagia 11/04/2019   Dysphagia as late effect of cerebrovascular accident (CVA) 11/03/2019   CKD (chronic kidney disease) stage 4, GFR 15-29 ml/min ([place]) 09/29/2019   Thrombocytopenia ([place])  09/29/2019   Personal history of DVT (deep vein thrombosis) 09/09/2019   History of ischemic stroke 09/09/2019   CAD (coronary artery disease) 09/09/2019   Chronic constipation 09/09/2019   History of colorectal cancer 09/09/2019   Facial paralysis on right side 09/09/2019   Right arm weakness 09/09/2019   Right leg weakness 09/09/2019   Pseudobulbar affect 09/09/2019    Kerrie Pleasure 04/22/2020, 7:03 PM  [facility] [address] [place], [place], [address] Phone: [phone]   Fax:  [phone]  Name: Robert Mcconnell MRN: [id number] Date of Birth: [birth year]

## 2020-04-27 ENCOUNTER — Ambulatory Visit: Payer: No Typology Code available for payment source

## 2020-04-27 ENCOUNTER — Other Ambulatory Visit: Payer: Self-pay

## 2020-04-27 DIAGNOSIS — M6281 Muscle weakness (generalized): Secondary | ICD-10-CM

## 2020-04-27 DIAGNOSIS — R2681 Unsteadiness on feet: Secondary | ICD-10-CM

## 2020-04-27 DIAGNOSIS — R2689 Other abnormalities of gait and mobility: Secondary | ICD-10-CM

## 2020-04-27 DIAGNOSIS — I69351 Hemiplegia and hemiparesis following cerebral infarction affecting right dominant side: Secondary | ICD-10-CM | POA: Diagnosis not present

## 2020-04-27 NOTE — Therapy (Signed)
[facility] [address] [place], [place], [address] Phone: [phone]   Fax:  [phone]  Physical Therapy Treatment  Patient Details  Name: Robert Mcconnell MRN: [id number] Date of Birth: [birth year] Referring Provider (PT): Len Childs Beers   Encounter Date: 04/27/2020   PT End of Session - 04/27/20 1727    Visit Number 76    Number of Visits 35    Date for PT Re-Evaluation 04/29/20    Authorization Type VA, eval 10/10/44, recert 5/0/35; VA auth 15 visits from 01/08/20 to 05/07/20; 15 visits from 03/02/20 to 07/01/19    Authorization Time Period 15 visits from 9/2 to 05/07/20    Authorization - Visit Number 9    Authorization - Number of Visits 15    Progress Note Due on Visit 15    PT Start Time 1615    PT Stop Time 1705    PT Time Calculation (min) 50 min    Equipment Utilized During Treatment Gait belt    Activity Tolerance Patient tolerated treatment well    Behavior During Therapy WFL for tasks assessed/performed           Past Medical History:  Diagnosis Date   Atrial fibrillation ([place])    BPH (benign prostatic hyperplasia)    CKD (chronic kidney disease)    History of coronary angioplasty with insertion of stent    Hypertension    Severe sepsis ([place]) 11/04/2019   Stroke [facility])     Past Surgical History:  Procedure Laterality Date   COLECTOMY  2006   cancer    There were no vitals filed for this visit.   Subjective Assessment - 04/27/20 1725    Subjective daughter reports that he is not doing that well in last couple of days. He is having hard time with standing and complaining about R leg hurting when aide tries to stretch it.    Patient is accompained by: Family member    Pertinent History CVA    Limitations Standing;Walking;House hold activities;Sitting    How long can you sit comfortably? 1-2 hours    How long can you stand comfortably? <5 min    How long can you walk  comfortably? unable    Patient Stated Goals walk              applied [place] E-stim to R quad: 85-95 Hz - Seated knee extensions: 10 sec on 20 sec off: verbal and tactile cues to improve mm firing, pt had decreased attention today and needed verbal cueing, only very mild active mm activation noted: limb was passively moved during E-stim activation: 25x  - Sit to stand: 10 sec on 50 sec off, cues to lean forward, mod to max A required with fatigue: 15x  Scooting in chair backwards from edge of the chair: max A required  Pulling and pushing with R arm with quick stretch at end range to faciliate mm activation: 10x R only                         PT Short Term Goals - 01/13/20 1836      PT SHORT TERM GOAL #1   Title Patient will be able to perform sit to stand with mod A with or without AD to improve independence and reduce care giver burden    Baseline max A (eval); mod to max A (10/30/19), mox to max A (12/01/19); mod A with 1 UE support (01/13/20)  Time 4    Period Weeks    Status Achieved    Target Date 11/27/19      PT SHORT TERM GOAL #2   Title pt will be able to stand with mod A for 5 min to perform small reaching with L UE to improve standing balance.    Baseline able to stand with holding on to parallel bars with L UE but needs stabilization at knees to improve standing tolerance (max A required overall) is able to reach with max A, requires mod to max A but unable to hold for 5 min while performing reaching. Requires mod to max A due to inconsistency (01/13/20)    Time 4    Period Weeks    Status On-going    Target Date 02/10/20      PT SHORT TERM GOAL #3   Title Pt will be able to propel chair with SBA  for 30 feet to improve household navigation    Baseline Able to propel chair with use of L UE and L LE with mod A for 30 feet (10/30/19); patient has power chair now (12/01/19)    Time 4    Period Weeks    Status Not Met    Target Date 10/06/19              PT Long Term Goals - 01/13/20 1838      PT LONG TERM GOAL #1   Title Patient will be able to stand for 5 min with CGA to improve standing endurance with or without AD    Baseline requires max A    Time 8    Period Weeks    Status On-going    Target Date 03/09/20      PT LONG TERM GOAL #2   Title Patient will be able to perform chair to bed transfer using hemi walker and mod A to reduce caregiver burden    Baseline max A with stand pivot transfers    Time 8    Period Weeks    Status On-going    Target Date 03/09/20      PT LONG TERM GOAL #3   Title Pt will be able to ambulate 10 feet with hemi walker and mod to max A to improve in home mobility    Baseline total A with body weight support system    Time 8    Period Weeks    Status Revised      PT LONG TERM GOAL #4   Title Pt will demo >10/56 on BBS to improve static balance and reduce fall risk    Baseline BBS 4/56 (eval)    Time 8    Period Weeks    Status On-going    Target Date 03/09/20                 Plan - 04/27/20 1726    Clinical Impression Statement Pt was compaining about R leg pain in upper thigh area with hip flexion above 90 deg and hip adduction which may be due to hip impingement. Today's session was focused on performing E-stim to R quad with open chain exercises and with functional sit to stand transfers. Pt had more dififculty following commands compared to previous 2 sessions.    Personal Factors and Comorbidities Age;Comorbidity 3+    Comorbidities hx of stroke, hx of colorectal cancer    Examination-Activity Limitations Bathing;Bed Mobility;Caring for Others;Carry;Locomotion Level;Hygiene/Grooming;Dressing;Continence;Self Feeding;Reach Overhead;Sit;Lift;Other;Transfers;Toileting;Stand;Stairs    Rehab Potential Good  PT Frequency 2x / week    PT Duration 8 weeks    PT Treatment/Interventions ADLs/Self Care Home Management;Electrical Stimulation;Gait training;Stair training;Functional  mobility training;Therapeutic exercise;Balance training;Neuromuscular re-education;Therapeutic activities;Patient/family education;Wheelchair mobility training;Manual techniques;Passive range of motion    PT Next Visit Plan Continue trial of knee immobilizer on R LE    Consulted and Agree with Plan of Care Patient;Family member/caregiver    Family Member Consulted daughter           Patient will benefit from skilled therapeutic intervention in order to improve the following deficits and impairments:  Abnormal gait,Decreased activity tolerance,Decreased coordination,Decreased safety awareness,Decreased strength,Impaired flexibility,Impaired UE functional use,Postural dysfunction,Impaired tone,Increased edema,Decreased range of motion,Decreased knowledge of precautions,Impaired sensation,Difficulty walking,Decreased mobility,Decreased endurance,Decreased balance  Visit Diagnosis: Hemiplegia and hemiparesis following cerebral infarction affecting right dominant side (HCC)  Muscle weakness (generalized)  Unsteadiness on feet  Other abnormalities of gait and mobility     Problem List Patient Active Problem List   Diagnosis Date Noted   Dilated cardiomyopathy ([place]) 03/05/2020   History of CVA (cerebrovascular accident) 03/05/2020   Recurrent UTI 01/09/2020   History of basal cell cancer 01/09/2020   Chronic respiratory failure with hypoxia ([place]) 11/07/2019   Pressure injury of skin 11/05/2019   Acute renal failure superimposed on stage 4 chronic kidney disease ([place]) 11/04/2019   Right hemiparesis ([place]) 11/04/2019   Atrial fibrillation, chronic ([place]) 11/04/2019   Dysphagia 11/04/2019   Dysphagia as late effect of cerebrovascular accident (CVA) 11/03/2019   CKD (chronic kidney disease) stage 4, GFR 15-29 ml/min ([place]) 09/29/2019   Thrombocytopenia ([place]) 09/29/2019   Personal history of DVT (deep vein thrombosis) 09/09/2019   History of ischemic stroke 09/09/2019   CAD  (coronary artery disease) 09/09/2019   Chronic constipation 09/09/2019   History of colorectal cancer 09/09/2019   Facial paralysis on right side 09/09/2019   Right arm weakness 09/09/2019   Right leg weakness 09/09/2019   Pseudobulbar affect 09/09/2019    Kerrie Pleasure, PT 04/27/2020, 5:28 PM  [facility] [address] [place], [place], [address] Phone: [phone]   Fax:  [phone]  Name: Robert Mcconnell MRN: [id number] Date of Birth: 1922/03/24

## 2020-04-29 ENCOUNTER — Ambulatory Visit: Payer: No Typology Code available for payment source

## 2020-05-04 ENCOUNTER — Ambulatory Visit: Payer: No Typology Code available for payment source

## 2020-05-04 ENCOUNTER — Other Ambulatory Visit: Payer: Self-pay

## 2020-05-04 DIAGNOSIS — I69351 Hemiplegia and hemiparesis following cerebral infarction affecting right dominant side: Secondary | ICD-10-CM

## 2020-05-04 DIAGNOSIS — R2681 Unsteadiness on feet: Secondary | ICD-10-CM

## 2020-05-04 DIAGNOSIS — M6281 Muscle weakness (generalized): Secondary | ICD-10-CM

## 2020-05-04 DIAGNOSIS — R2689 Other abnormalities of gait and mobility: Secondary | ICD-10-CM

## 2020-05-04 NOTE — Therapy (Signed)
[facility] [address] [place], [place], [address] Phone: [phone]   Fax:  [phone]  Physical Therapy Treatment  Patient Details  Name: Robert Mcconnell MRN: [id number] Date of Birth: [birth year] Referring Provider (PT): Arnell Sieving   Encounter Date: 05/04/2020   PT End of Session - 05/04/20 1806    Visit Number 102    Number of Visits 45    Date for PT Re-Evaluation 04/29/20    Authorization Type VA, eval 06/13/18, recert 07/10/57; VA auth 15 visits from 01/08/20 to 05/07/20; 15 visits from 03/02/20 to 07/01/19    Authorization Time Period 15 visits from 9/2 to 05/07/20    Authorization - Visit Number 14    Authorization - Number of Visits 15    Progress Note Due on Visit 15    PT Start Time 1620    PT Stop Time 1705    PT Time Calculation (min) 45 min    Equipment Utilized During Treatment Gait belt    Activity Tolerance Patient tolerated treatment well    Behavior During Therapy [facility] for tasks assessed/performed           Past Medical History:  Diagnosis Date  . Atrial fibrillation ([place])   . BPH (benign prostatic hyperplasia)   . CKD (chronic kidney disease)   . History of coronary angioplasty with insertion of stent   . Hypertension   . Severe sepsis ([place]) 11/04/2019  . Stroke [facility])     Past Surgical History:  Procedure Laterality Date  . COLECTOMY  2006   cancer    There were no vitals filed for this visit.   Subjective Assessment - 05/04/20 1804    Subjective Daughter reports his cognition is better compared to last session. No new falls.    Patient is accompained by: Family member    Pertinent History CVA    Limitations Standing;Walking;House hold activities;Sitting    How long can you sit comfortably? 1-2 hours    How long can you stand comfortably? <5 min    How long can you walk comfortably? unable    Patient Stated Goals walk                Sit to stand with mat table  to left side with 8" box on top for added height: 2x max A, required max A to stay standing due to excessive R lateral lean Sit to stand with mat table in front and 8" box to transfer hand to when standing: max A, pt continues to scoot forward and needs cueing to push down with his bil legs to assist him with standing. Standing with leaning on R elbow for support and mod to max A from PT: stacking and matching color cones Practiced cognitive tasks with sit to stands, sitting and standing: - Counting by 5s (unable to) - adding fingers: 2 +2, 2=+3, 3+4, 5+3: able to perform correctly - able to read words: "clean" and "dirty" from signs correctly - able to spell DOG, CAT, SKY, unable to spell GOD, LENS, CAMERA, HAT, CAP etc. - unable to say words that starts with letter "F"                       PT Short Term Goals - 01/13/20 1836      PT SHORT TERM GOAL #1   Title Patient will be able to perform sit to stand with mod A with or without AD to improve  independence and reduce care giver burden    Baseline max A (eval); mod to max A (10/30/19), mox to max A (12/01/19); mod A with 1 UE support (01/13/20)    Time 4    Period Weeks    Status Achieved    Target Date 11/27/19      PT SHORT TERM GOAL #2   Title pt will be able to stand with mod A for 5 min to perform small reaching with L UE to improve standing balance.    Baseline able to stand with holding on to parallel bars with L UE but needs stabilization at knees to improve standing tolerance (max A required overall) is able to reach with max A, requires mod to max A but unable to hold for 5 min while performing reaching. Requires mod to max A due to inconsistency (01/13/20)    Time 4    Period Weeks    Status On-going    Target Date 02/10/20      PT SHORT TERM GOAL #3   Title Pt will be able to propel chair with SBA  for 30 feet to improve household navigation    Baseline Able to propel chair with use of L UE and L LE with mod  A for 30 feet (10/30/19); patient has power chair now (12/01/19)    Time 4    Period Weeks    Status Not Met    Target Date 10/06/19             PT Long Term Goals - 01/13/20 1838      PT LONG TERM GOAL #1   Title Patient will be able to stand for 5 min with CGA to improve standing endurance with or without AD    Baseline requires max A    Time 8    Period Weeks    Status On-going    Target Date 03/09/20      PT LONG TERM GOAL #2   Title Patient will be able to perform chair to bed transfer using hemi walker and mod A to reduce caregiver burden    Baseline max A with stand pivot transfers    Time 8    Period Weeks    Status On-going    Target Date 03/09/20      PT LONG TERM GOAL #3   Title Pt will be able to ambulate 10 feet with hemi walker and mod to max A to improve in home mobility    Baseline total A with body weight support system    Time 8    Period Weeks    Status Revised      PT LONG TERM GOAL #4   Title Pt will demo >10/56 on BBS to improve static balance and reduce fall risk    Baseline BBS 4/56 (eval)    Time 8    Period Weeks    Status On-going    Target Date 03/09/20                 Plan - 05/04/20 1804    Clinical Impression Statement Today's skilled session was focused on continuation to improve sit to stand transfers with lesser assist. We worked on quick standing up to improve muscle recruitment and patient did slightly better. Daughter was educated on practicing quick stands ups to improve mm recruitment. patient continues to require max A with sit to stand when pushing down on arm rest or table in front. When he pulls  on parallel bar he requires mod A (but it is not functional). we also focused on performing cognitive tasks with standing and with sit to stand transfers.    Personal Factors and Comorbidities Age;Comorbidity 3+    Comorbidities hx of stroke, hx of colorectal cancer    Examination-Activity Limitations Bathing;Bed Mobility;Caring  for Others;Carry;Locomotion Level;Hygiene/Grooming;Dressing;Continence;Self Feeding;Reach Overhead;Sit;Lift;Other;Transfers;Toileting;Stand;Stairs    Rehab Potential Good    PT Frequency 2x / week    PT Duration 8 weeks    PT Treatment/Interventions ADLs/Self Care Home Management;Electrical Stimulation;Gait training;Stair training;Functional mobility training;Therapeutic exercise;Balance training;Neuromuscular re-education;Therapeutic activities;Patient/family education;Wheelchair mobility training;Manual techniques;Passive range of motion    PT Next Visit Plan Continue trial of knee immobilizer on R LE    Consulted and Agree with Plan of Care Patient;Family member/caregiver    Family Member Consulted daughter           Patient will benefit from skilled therapeutic intervention in order to improve the following deficits and impairments:  Abnormal gait,Decreased activity tolerance,Decreased coordination,Decreased safety awareness,Decreased strength,Impaired flexibility,Impaired UE functional use,Postural dysfunction,Impaired tone,Increased edema,Decreased range of motion,Decreased knowledge of precautions,Impaired sensation,Difficulty walking,Decreased mobility,Decreased endurance,Decreased balance  Visit Diagnosis: Hemiplegia and hemiparesis following cerebral infarction affecting right dominant side (HCC)  Muscle weakness (generalized)  Unsteadiness on feet  Other abnormalities of gait and mobility     Problem List Patient Active Problem List   Diagnosis Date Noted  . Dilated cardiomyopathy ([place]) 03/05/2020  . History of CVA (cerebrovascular accident) 03/05/2020  . Recurrent UTI 01/09/2020  . History of basal cell cancer 01/09/2020  . Chronic respiratory failure with hypoxia ([place]) 11/07/2019  . Pressure injury of skin 11/05/2019  . Acute renal failure superimposed on stage 4 chronic kidney disease ([place]) 11/04/2019  . Right hemiparesis ([place]) 11/04/2019  . Atrial fibrillation,  chronic ([place]) 11/04/2019  . Dysphagia 11/04/2019  . 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 CKD (chronic kidney disease) stage 4, GFR 15-29 ml/min (HCC) 09/29/2019  . Thrombocytopenia ([place]) 09/29/2019  . Personal history of DVT (deep vein thrombosis) 09/09/2019  . History of ischemic stroke 09/09/2019  . CAD (coronary artery disease) 09/09/2019  . Chronic constipation 09/09/2019  . History of colorectal cancer 09/09/2019  . 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 Right arm weakness 09/09/2019  . Right leg weakness 09/09/2019  . Pseudobulbar affect 09/09/2019    Kerrie Pleasure, PT 05/04/2020, 6:11 PM  [facility] [address] [place], [place], [address] Phone: [phone]   Fax:  [phone]  Name: Robert Mcconnell MRN: [id number] Date of Birth: 03-08-1922

## 2020-05-06 ENCOUNTER — Ambulatory Visit: Payer: No Typology Code available for payment source

## 2020-05-06 ENCOUNTER — Other Ambulatory Visit: Payer: Self-pay

## 2020-05-06 DIAGNOSIS — M6281 Muscle weakness (generalized): Secondary | ICD-10-CM

## 2020-05-06 DIAGNOSIS — I69351 Hemiplegia and hemiparesis following cerebral infarction affecting right dominant side: Secondary | ICD-10-CM

## 2020-05-06 DIAGNOSIS — R2681 Unsteadiness on feet: Secondary | ICD-10-CM

## 2020-05-06 DIAGNOSIS — R2689 Other abnormalities of gait and mobility: Secondary | ICD-10-CM

## 2020-05-11 NOTE — Therapy (Signed)
[facility] [address] [place], [place], [address] Phone: [phone]   Fax:  [phone]  Physical Therapy Discharge [place]  Patient Details  Name: Robert Mcconnell MRN: [id number] Date of Birth: [birth year] Referring Provider (PT): Arnell Sieving   Encounter Date: 05/06/2020   PT End of Session - 05/11/20 0902    Visit Number 59    Number of Visits 45    Date for PT Re-Evaluation 04/29/20    Authorization Type VA, eval 11/09/08, recert 06/13/83; VA auth 15 visits from 01/08/20 to 05/07/20; 15 visits from 03/02/20 to 07/01/19    Authorization Time Period 15 visits from 9/2 to 05/07/20    Authorization - Visit Number 15    Authorization - Number of Visits 15    Progress Note Due on Visit 15    PT Start Time 1615    PT Stop Time 1700    PT Time Calculation (min) 45 min    Equipment Utilized During Treatment Gait belt    Activity Tolerance Patient tolerated treatment well    Behavior During Therapy [facility] for tasks assessed/performed           Past Medical History:  Diagnosis Date  . Atrial fibrillation ([place])   . BPH (benign prostatic hyperplasia)   . CKD (chronic kidney disease)   . History of coronary angioplasty with insertion of stent   . Hypertension   . Severe sepsis ([place]) 11/04/2019  . Stroke [facility])     Past Surgical History:  Procedure Laterality Date  . COLECTOMY  2006   cancer    There were no vitals filed for this visit.   Subjective Assessment - 05/11/20 0858    Subjective Symptoms are stable    Patient is accompained by: Family member    Pertinent History CVA    Limitations Standing;Walking;House hold activities;Sitting    How long can you sit comfortably? 1-2 hours    How long can you stand comfortably? <5 min    How long can you walk comfortably? unable    Patient Stated Goals walk              [facility] PT Assessment - 05/11/20 0001      Berg Balance Test   Sit to Stand Needs  moderate or maximal assist to stand    Standing Unsupported Unable to stand 30 seconds unassisted    Sitting with Back Unsupported but Feet Supported on Floor or Stool Able to sit safely and securely 2 minutes    Stand to Sit Needs assistance to sit    Transfers Needs one person to assist    Standing Unsupported with Eyes Closed Needs help to keep from falling    Standing Unsupported with Feet Together Needs help to attain position and unable to hold for 15 seconds    From Standing, Reach Forward with Outstretched Arm Loses balance while trying/requires external support    From Standing Position, Pick up Object from Floor Unable to try/needs assist to keep balance    From Standing Position, Turn to Look Behind Over each Shoulder Needs assist to keep from losing balance and falling    Turn 360 Degrees Needs assistance while turning    Standing Unsupported, Alternately Place Feet on Step/Stool Needs assistance to keep from falling or unable to try    Standing Unsupported, One Foot in Front Loses balance while stepping or standing    Standing on One Leg Unable to try or needs assist to  prevent fall    Total Score 5                                   PT Short Term Goals - 05/11/20 0859      PT SHORT TERM GOAL #1   Title Patient will be able to perform sit to stand with mod A with or without AD to improve independence and reduce care giver burden    Baseline max A (eval); mod to max A (10/30/19), mox to max A (12/01/19); mod A with 1 UE support (01/13/20)    Time 4    Period Weeks    Status Achieved    Target Date 11/27/19      PT SHORT TERM GOAL #2   Title pt will be able to stand with mod A for 5 min to perform small reaching with L UE to improve standing balance.    Baseline able to stand with holding on to parallel bars with L UE but needs stabilization at knees to improve standing tolerance (max A required overall) is able to reach with max A, requires mod to max A  but unable to hold for 5 min while performing reaching. Requires mod to max A due to inconsistency (01/13/20)    Time 4    Period Weeks    Status Not Met    Target Date 02/10/20      PT SHORT TERM GOAL #3   Title Pt will be able to propel chair with SBA  for 30 feet to improve household navigation    Baseline Able to propel chair with use of L UE and L LE with mod A for 30 feet (10/30/19); patient has power chair now (12/01/19)    Time 4    Period Weeks    Status Not Met    Target Date 10/06/19             PT Long Term Goals - 05/11/20 0859      PT LONG TERM GOAL #1   Title Patient will be able to stand for 5 min with CGA to improve standing endurance with or without AD    Baseline requires max A    Time 8    Period Weeks    Status Not Met      PT LONG TERM GOAL #2   Title Patient will be able to perform chair to bed transfer using hemi walker and mod A to reduce caregiver burden    Baseline max A with stand pivot transfers    Time 8    Period Weeks    Status Not Met      PT LONG TERM GOAL #3   Title Pt will be able to ambulate 10 feet with hemi walker and mod to max A to improve in home mobility    Baseline total A with body weight support system    Time 8    Period Weeks    Status Achieved      PT LONG TERM GOAL #4   Title Pt will demo >10/56 on BBS to improve static balance and reduce fall risk    Baseline BBS 4/56 (eval); 5/56 discharge    Time 8    Period Weeks    Status Not Met                 Plan - 05/11/20 0904    Clinical  Impression Statement Patient has been seen for total of 59 sessions from 09/08/19 to 05/06/20. Patient has met his goals partially Patient still requires mod to max A with trasnfers. Patient is able to ambulate for 10 feet with walker and max A x 2. Due to lack in significant progress, patient will be discharged from skilled PT at this time.    Personal Factors and Comorbidities Age;Comorbidity 3+    Comorbidities hx of stroke, hx of  colorectal cancer    Examination-Activity Limitations Bathing;Bed Mobility;Caring for Others;Carry;Locomotion Level;Hygiene/Grooming;Dressing;Continence;Self Feeding;Reach Overhead;Sit;Lift;Other;Transfers;Toileting;Stand;Stairs    Rehab Potential Good    PT Frequency 2x / week    PT Duration 8 weeks    PT Treatment/Interventions ADLs/Self Care Home Management;Electrical Stimulation;Gait training;Stair training;Functional mobility training;Therapeutic exercise;Balance training;Neuromuscular re-education;Therapeutic activities;Patient/family education;Wheelchair mobility training;Manual techniques;Passive range of motion    PT Next Visit Plan discharge    Consulted and Agree with Plan of Care Patient;Family member/caregiver    Family Member Consulted daughter           Patient will benefit from skilled therapeutic intervention in order to improve the following deficits and impairments:  Abnormal gait,Decreased activity tolerance,Decreased coordination,Decreased safety awareness,Decreased strength,Impaired flexibility,Impaired UE functional use,Postural dysfunction,Impaired tone,Increased edema,Decreased range of motion,Decreased knowledge of precautions,Impaired sensation,Difficulty walking,Decreased mobility,Decreased endurance,Decreased balance  Visit Diagnosis: Hemiplegia and hemiparesis following cerebral infarction affecting right dominant side (HCC)  Muscle weakness (generalized)  Unsteadiness on feet  Other abnormalities of gait and mobility     Problem List Patient Active Problem List   Diagnosis Date Noted  . Dilated cardiomyopathy ([place]) 03/05/2020  . History of CVA (cerebrovascular accident) 03/05/2020  . Recurrent UTI 01/09/2020  . History of basal cell cancer 01/09/2020  . Chronic respiratory failure with hypoxia ([place]) 11/07/2019  . Pressure injury of skin 11/05/2019  . Acute renal failure superimposed on stage 4 chronic kidney disease ([place]) 11/04/2019  . Right  hemiparesis ([place]) 11/04/2019  . Atrial fibrillation, chronic ([place]) 11/04/2019  . Dysphagia 11/04/2019  . 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 CKD (chronic kidney disease) stage 4, GFR 15-29 ml/min (HCC) 09/29/2019  . Thrombocytopenia ([place]) 09/29/2019  . Personal history of DVT (deep vein thrombosis) 09/09/2019  . History of ischemic stroke 09/09/2019  . CAD (coronary artery disease) 09/09/2019  . Chronic constipation 09/09/2019  . History of colorectal cancer 09/09/2019  . 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 Right arm weakness 09/09/2019  . Right leg weakness 09/09/2019  . Pseudobulbar affect 09/09/2019    Kerrie Pleasure, PT 05/11/2020, 9:06 AM  [facility] [address] [place], [place], [address] Phone: [phone]   Fax:  [phone]  Name: Robert Mcconnell MRN: [id number] Date of Birth: Apr 01, 1922

## 2020-08-25 ENCOUNTER — Other Ambulatory Visit: Payer: Self-pay

## 2020-08-25 ENCOUNTER — Ambulatory Visit: Payer: Medicare Other | Attending: Family Medicine

## 2020-08-25 DIAGNOSIS — R2681 Unsteadiness on feet: Secondary | ICD-10-CM | POA: Insufficient documentation

## 2020-08-25 DIAGNOSIS — I69351 Hemiplegia and hemiparesis following cerebral infarction affecting right dominant side: Secondary | ICD-10-CM | POA: Insufficient documentation

## 2020-08-25 DIAGNOSIS — R2689 Other abnormalities of gait and mobility: Secondary | ICD-10-CM | POA: Diagnosis present

## 2020-08-25 DIAGNOSIS — M6281 Muscle weakness (generalized): Secondary | ICD-10-CM | POA: Insufficient documentation

## 2020-08-25 NOTE — Therapy (Signed)
OUTPATIENT PHYSICAL THERAPY NEURO EVALUATION   Patient Name: Trejan Scherr MRN: [id number] DOB:1921/10/02, 87 y.o., male Today's Date: 08/26/2020  PCP: [name], MD REFERRING PROVIDER: Eliezer Lofts, FNP    PT End of Session - 08/26/20 0942    Visit Number 1    Number of Visits 15    Date for PT Re-Evaluation 11/25/20    Authorization Type Eval 08/26/20    Authorization Time Period VA authorization (15 visits from 07/29/20 to 11/26/20)    Authorization - Visit Number 1    Authorization - Number of Visits 15    Progress Note Due on Visit 15    PT Start Time 1400    PT Stop Time 1440    PT Time Calculation (min) 40 min    Equipment Utilized During Treatment Gait belt    Activity Tolerance Patient tolerated treatment well    Behavior During Therapy WFL for tasks assessed/performed           Past Medical History:  Diagnosis Date  . Atrial fibrillation ([place])   . BPH (benign prostatic hyperplasia)   . CKD (chronic kidney disease)   . History of coronary angioplasty with insertion of stent   . Hypertension   . Severe sepsis ([place]) 11/04/2019  . Stroke [facility])    Past Surgical History:  Procedure Laterality Date  . COLECTOMY  2006   cancer   Patient Active Problem List   Diagnosis Date Noted  . Dilated cardiomyopathy ([place]) 03/05/2020  . History of CVA (cerebrovascular accident) 03/05/2020  . Recurrent UTI 01/09/2020  . History of basal cell cancer 01/09/2020  . Chronic respiratory failure with hypoxia ([place]) 11/07/2019  . Pressure injury of skin 11/05/2019  . Acute renal failure superimposed on stage 4 chronic kidney disease ([place]) 11/04/2019  . Right hemiparesis ([place]) 11/04/2019  . Atrial fibrillation, chronic ([place]) 11/04/2019  . Dysphagia 11/04/2019  . 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 CKD (chronic kidney disease) stage 4, GFR 15-29 ml/min (HCC) 09/29/2019  . Thrombocytopenia ([place]) 09/29/2019  . Personal history of DVT  (deep vein thrombosis) 09/09/2019  . History of ischemic stroke 09/09/2019  . CAD (coronary artery disease) 09/09/2019  . Chronic constipation 09/09/2019  . History of colorectal cancer 09/09/2019  . 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 Right arm weakness 09/09/2019  . Right leg weakness 09/09/2019  . Pseudobulbar affect 09/09/2019    Onset date: 07/30/20  REFERRING DIAG: I63.9 (ICD-10-CM) - Cerebral infarction, unspecified    THERAPY DIAG:  Muscle weakness (generalized)  Unsteadiness on feet  Hemiplegia and hemiparesis following cerebral infarction affecting right dominant side (HCC)  SUBJECTIVE:   PATIENT HISTORY: CVA 06/29/19 fell on floor while getting out of bed at 6 am in the morning. Called out for his wife. She called 911. In hospital for 9 days. He went to daughter to [facility]. Has aide 5 days a week for 8 hours. Change him, feed him, bed baths. Has shower chair, walk in shower, power chair, hemi walker. Had PT at neuro from 09/2019 to 04/2020. Daughter/aide is working on standing activities at [employer] some cognitive tasks. Daughter reports that his cognition is slightly better compared to last time and it is easier to understand him. He can follow one step commands.  PAIN:  Are you having pain? No  PRECAUTIONS: Fall  WEIGHT BEARING RESTRICTIONS No  FALLS: Has patient fallen in last 6 months? No, Number of falls: 00  LIVING ENVIRONMENT: Lives with: places; lives with: lives with their family, caregiver Lives in: House/apartment Stairs: No;  Has following equipment at home: Wheelchair (power) and Electronics engineer  PLOF: Needs assistance with ADLs, Needs assistance with homemaking, Needs assistance with gait and Needs assistance with transfers  PATIENT GOALS improve  independence  OBJECTIVE:   COGNITION: Overall cognitive status: Impaired/different from baseline Areas of impairment: Safety/judgement and Awareness Memory: Deficits        BED MOBILITY:  Supine to left side Mod A Supine to right side Max A  TRANSFERS: Assistive device utilized: Grab bars  Sit to stand: Mod A Stand to sit: Mod A Chair to chair: Max A Floor: not performed  GAIT: unable to assess   TODAY'S TREATMENT:  Sit to stand: 7x at parallel bars, mod A, pt needed lot of encouragement to stand up, decreased WB on R LE, excessive lean to R Attempted to walk with hemi bar but patient refused   PATIENT EDUCATION: Education details: Daughter and caregiver educated on working on sit to stand at home for 5 consecutive reps Person educated: Patient and Child(ren) and caregiver Education method: Explanation Education comprehension: verbalized understanding   HOME EXERCISE PROGRAM: Perform sit to stand (08/26/20)  ASSESSMENT:  CLINICAL IMPRESSION: Patient is a 87 y.o. male who was seen today for physical therapy evaluation and treatment for gait and mobility disorder from CVA in 2021. Objective impairments include Abnormal gait, decreased activity tolerance, decreased balance, decreased cognition, decreased coordination, decreased endurance, decreased mobility, difficulty walking, decreased ROM, decreased strength, decreased safety awareness, increased fascial restrictions, impaired flexibility, impaired sensation, impaired tone, impaired UE functional use and postural dysfunction. These impairments are limiting patient from cleaning, community activity and ADLs, transfers, bed mobility, functional ambulation. Personal factors including Age, Past/current experiences, Time since onset of injury/illness/exacerbation, Transportation and 1-2 comorbidities: CVA are also affecting patient's functional outcome. Patient will benefit from skilled PT to address above impairments and  improve overall function.  REHAB POTENTIAL: Fair    CLINICAL DECISION MAKING: Stable/uncomplicated  EVALUATION COMPLEXITY: Moderate   GOALS: Goals reviewed with patient? Yes  SHORT TERM GOALS:  STG Name Target Date Goal status  1 Pt will be able to perform 5x sit to stand with parallel bars/grab bars with min A from PT or caregiver to reduce caregiver burden Comments: Parallel bar + mod A from PT (08/26/20) 10/14/20 INITIAL  2 Pt will be able to ambulate 10 feet in parallel bars with max A from PT to reduce caregiver burden. Comments: not attempted (08/26/20) 10/14/20 INITIAL   LONG TERM GOALS:   LTG Name Target Date Goal status  1 Pt will be able to stand for 20 seconds with CGA and without UE support to improve standing balance Comments: cannot stand without support (08/26/20) 11/25/20 INITIAL  2 Pt will be able to ambulate 100 feet with max A from steady lift to improve WB through LE and improve gait pattern Comments: not attempted (08/26/20) 11/25/20 INITIAL  3 Pt will be able to perform 5x sit to stand with parallel bars/grab bars with min A from PT or caregiver to reduce caregiver burden. Comments: parallel bars + Mod A from PT (08/26/20) 11/25/20 INITIAL   PLAN: PT FREQUENCY: 1x/week  PT DURATION: 15 sessions  PLANNED INTERVENTIONS: Therapeutic exercises, Therapeutic activity, Neuro Muscular re-education, Balance  training, Gait training, Patient/Family education, Joint mobilization, Orthotic/Fit training, Electrical stimulation, Wheelchair mobility training, Cryotherapy and Moist heat  PLAN FOR NEXT SESSION: Clarise Cruz lift walking, work on sit to stand, work on standing balance   Kerrie Pleasure, PT 08/26/2020, 9:46 AM  [facility] [address] [place], [place], [address] Phone: [phone]   Fax:  [phone]

## 2020-09-01 ENCOUNTER — Ambulatory Visit: Payer: Medicare Other

## 2020-09-01 ENCOUNTER — Other Ambulatory Visit: Payer: Self-pay

## 2020-09-01 DIAGNOSIS — I69351 Hemiplegia and hemiparesis following cerebral infarction affecting right dominant side: Secondary | ICD-10-CM

## 2020-09-01 DIAGNOSIS — R2689 Other abnormalities of gait and mobility: Secondary | ICD-10-CM

## 2020-09-01 DIAGNOSIS — M6281 Muscle weakness (generalized): Secondary | ICD-10-CM

## 2020-09-01 DIAGNOSIS — R2681 Unsteadiness on feet: Secondary | ICD-10-CM

## 2020-09-02 NOTE — Therapy (Signed)
OUTPATIENT PHYSICAL THERAPY TREATMENT NOTE   Patient Name: Robert Mcconnell MRN: [id number] DOB:[birth year], 87 y.o., male Today's Date: 09/02/2020  PCP: [name], MD REFERRING PROVIDER: Eliezer Lofts, FNP   PT End of Session - 09/02/20 1400    Visit Number 2    Number of Visits 15    Date for PT Re-Evaluation 11/25/20    Authorization Type Eval 08/26/20    Authorization Time Period VA authorization (15 visits from 07/29/20 to 11/26/20)    Authorization - Visit Number 2    Authorization - Number of Visits 15    Progress Note Due on Visit 15    PT Start Time 1230    PT Stop Time 1315    PT Time Calculation (min) 45 min    Equipment Utilized During Treatment Gait belt    Activity Tolerance Patient tolerated treatment well    Behavior During Therapy WFL for tasks assessed/performed           Past Medical History:  Diagnosis Date  . Atrial fibrillation ([place])   . BPH (benign prostatic hyperplasia)   . CKD (chronic kidney disease)   . History of coronary angioplasty with insertion of stent   . Hypertension   . Severe sepsis ([place]) 11/04/2019  . Stroke [facility])    Past Surgical History:  Procedure Laterality Date  . COLECTOMY  2006   cancer   Patient Active Problem List   Diagnosis Date Noted  . Dilated cardiomyopathy ([place]) 03/05/2020  . History of CVA (cerebrovascular accident) 03/05/2020  . Recurrent UTI 01/09/2020  . History of basal cell cancer 01/09/2020  . Chronic respiratory failure with hypoxia ([place]) 11/07/2019  . Pressure injury of skin 11/05/2019  . Acute renal failure superimposed on stage 4 chronic kidney disease ([place]) 11/04/2019  . Right hemiparesis ([place]) 11/04/2019  . Atrial fibrillation, chronic ([place]) 11/04/2019  . Dysphagia 11/04/2019  . 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 CKD (chronic kidney disease) stage 4, GFR 15-29 ml/min (HCC) 09/29/2019  . Thrombocytopenia ([place]) 09/29/2019  . Personal history of DVT  (deep vein thrombosis) 09/09/2019  . History of ischemic stroke 09/09/2019  . CAD (coronary artery disease) 09/09/2019  . Chronic constipation 09/09/2019  . History of colorectal cancer 09/09/2019  . 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 Right arm weakness 09/09/2019  . Right leg weakness 09/09/2019  . Pseudobulbar affect 09/09/2019    REFERRING DIAG: I63.9 (ICD-10-CM) - Cerebral infarction, unspecified     THERAPY DIAG:  Muscle weakness (generalized)  Unsteadiness on feet  Hemiplegia and hemiparesis following cerebral infarction affecting right dominant side (HCC)  Other abnormalities of gait and mobility  SUBJECTIVE: Daughter and caregiver present during the session. Nothing new to report. Have not tried consecutive sit to stand at home.  PAIN:  Are you having pain? No     OBJECTIVE:   TODAY'S TREATMENT: Used sara lift for sit to stand and for standing tolerance today: Sit to stand using Saralift: max A 4x Standing with use of Saralift: max A, 60lbs unweighed using Sara lift, cues for locking knees, standing up straight, performed side to side weight shifts: 10x 4 x 3' Sit to stand using parallel bars: 4x, max A    ASSESSMENT:  CLINICAL IMPRESSION: Pt required max A with sit to stand transfers today. Pt required max cueing for upright posture even in Saralift. Pt didn't want to walk today so we focused on improving standing endurance. Standing endurance limited to 3-4  minutes at a time.  REHAB POTENTIAL: Fair    CLINICAL DECISION MAKING: Stable/uncomplicated  EVALUATION COMPLEXITY: Moderate   GOALS: Goals reviewed with patient? Yes  SHORT TERM GOALS:  STG Name Target Date Goal status  1 Pt will be able to perform 5x sit to stand with parallel bars/grab bars with min A from PT or caregiver to reduce caregiver burden Comments: Parallel bar + mod A from PT (08/26/20) 10/14/20 INITIAL  2 Pt will be able to ambulate 10 feet in parallel bars with  max A from PT to reduce caregiver burden. Comments: not attempted (08/26/20) 10/14/20 INITIAL   LONG TERM GOALS:   LTG Name Target Date Goal status  1 Pt will be able to stand for 20 seconds with CGA and without UE support to improve standing balance Comments: cannot stand without support (08/26/20) 11/25/20 INITIAL  2 Pt will be able to ambulate 100 feet with max A from steady lift to improve WB through LE and improve gait pattern Comments: not attempted (08/26/20) 11/25/20 INITIAL  3 Pt will be able to perform 5x sit to stand with parallel bars/grab bars with min A from PT or caregiver to reduce caregiver burden. Comments: parallel bars + Mod A from PT (08/26/20) 11/25/20 INITIAL   PLAN: PT FREQUENCY: 1x/week  PT DURATION: 15 sessions  PLANNED INTERVENTIONS: Therapeutic exercises, Therapeutic activity, Neuro Muscular re-education, Balance training, Gait training, Patient/Family education, Joint mobilization, Orthotic/Fit training, Electrical stimulation, Wheelchair mobility training, Cryotherapy and Moist heat  PLAN FOR NEXT SESSION: Clarise Cruz lift walking, work on sit to stand, work on standing balance      Kerrie Pleasure, PT 09/02/2020, 2:01 PM    [facility] [address] [place], [place], [address] Phone: [phone]   Fax:  [phone]  Patient name: Robert Mcconnell MRN: [id number] DOB: [birth year]

## 2020-09-03 ENCOUNTER — Ambulatory Visit: Payer: Non-veteran care

## 2020-09-08 ENCOUNTER — Other Ambulatory Visit: Payer: Self-pay

## 2020-09-08 ENCOUNTER — Ambulatory Visit: Payer: Medicare Other | Attending: Family Medicine

## 2020-09-08 DIAGNOSIS — R2689 Other abnormalities of gait and mobility: Secondary | ICD-10-CM | POA: Diagnosis present

## 2020-09-08 DIAGNOSIS — R2681 Unsteadiness on feet: Secondary | ICD-10-CM | POA: Diagnosis present

## 2020-09-08 DIAGNOSIS — M6281 Muscle weakness (generalized): Secondary | ICD-10-CM | POA: Diagnosis present

## 2020-09-08 DIAGNOSIS — I69351 Hemiplegia and hemiparesis following cerebral infarction affecting right dominant side: Secondary | ICD-10-CM | POA: Diagnosis present

## 2020-09-08 NOTE — Therapy (Signed)
OUTPATIENT PHYSICAL THERAPY TREATMENT NOTE   Patient Name: Robert Mcconnell MRN: [id number] DOB:Apr 10, 1922, 87 y.o., male 37 Date: 09/08/2020  PCP: [name], MD REFERRING PROVIDER: Eliezer Lofts, FNP   PT End of Session - 09/08/20 1319    Visit Number 3    Number of Visits 15    Date for PT Re-Evaluation 11/25/20    Authorization Type Eval 08/26/20    Authorization Time Period VA authorization (15 visits from 07/29/20 to 11/26/20)    Authorization - Visit Number 3    Authorization - Number of Visits 15    Progress Note Due on Visit 15    PT Start Time 1230    PT Stop Time 1315    PT Time Calculation (min) 45 min    Equipment Utilized During Treatment Gait belt    Activity Tolerance Patient tolerated treatment well    Behavior During Therapy WFL for tasks assessed/performed           Past Medical History:  Diagnosis Date  . Atrial fibrillation ([place])   . BPH (benign prostatic hyperplasia)   . CKD (chronic kidney disease)   . History of coronary angioplasty with insertion of stent   . Hypertension   . Severe sepsis ([place]) 11/04/2019  . Stroke [facility])    Past Surgical History:  Procedure Laterality Date  . COLECTOMY  2006   cancer   Patient Active Problem List   Diagnosis Date Noted  . Dilated cardiomyopathy ([place]) 03/05/2020  . History of CVA (cerebrovascular accident) 03/05/2020  . Recurrent UTI 01/09/2020  . History of basal cell cancer 01/09/2020  . Chronic respiratory failure with hypoxia ([place]) 11/07/2019  . Pressure injury of skin 11/05/2019  . Acute renal failure superimposed on stage 4 chronic kidney disease ([place]) 11/04/2019  . Right hemiparesis ([place]) 11/04/2019  . Atrial fibrillation, chronic ([place]) 11/04/2019  . Dysphagia 11/04/2019  . 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 CKD (chronic kidney disease) stage 4, GFR 15-29 ml/min (HCC) 09/29/2019  . Thrombocytopenia ([place]) 09/29/2019  . Personal history of DVT  (deep vein thrombosis) 09/09/2019  . History of ischemic stroke 09/09/2019  . CAD (coronary artery disease) 09/09/2019  . Chronic constipation 09/09/2019  . History of colorectal cancer 09/09/2019  . 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 Right arm weakness 09/09/2019  . Right leg weakness 09/09/2019  . Pseudobulbar affect 09/09/2019    REFERRING DIAG: I63.9 (ICD-10-CM) - Cerebral infarction, unspecified     THERAPY DIAG:  Unsteadiness on feet  Muscle weakness (generalized)  Hemiplegia and hemiparesis following cerebral infarction affecting right dominant side (HCC)  Other abnormalities of gait and mobility  SUBJECTIVE: Daughter and caregiver present during the session. Nothing new to report.   PAIN:  Are you having pain? No     OBJECTIVE:  09/08/20: Used sara lift for sit to stand and for standing tolerance today: Sit to stand using Saralift: max A 4x Gait training: max A  With Clarise Cruz lift and PT and rehab aide guiding sara lift: 1 x 3 steps, 1 x 2 steps, 1 x 2 steps, 1 x 10 feet with manually blocking R knee and tactile cues for stepping with L LE  09/01/20 Used sara lift for sit to stand and for standing tolerance today: Sit to stand using Saralift: max A 4x Standing with use of Saralift: max A, 60lbs unweighed using Sara lift, cues for locking knees, standing up straight, performed side to side weight shifts: 10x  4 x 3' Sit to stand using parallel bars: 4x, max A    ASSESSMENT:  CLINICAL IMPRESSION: Pt demonstrates decreased motivation and requires lot of encouragement to participate in therapy. Pt demonstrated improved walking today but requires max A with saralift and to advance R LE.  REHAB POTENTIAL: Fair    CLINICAL DECISION MAKING: Stable/uncomplicated  EVALUATION COMPLEXITY: Moderate   GOALS: Goals reviewed with patient? Yes  SHORT TERM GOALS:  STG Name Target Date Goal status  1 Pt will be able to perform 5x sit to stand with  parallel bars/grab bars with min A from PT or caregiver to reduce caregiver burden Comments: Parallel bar + mod A from PT (08/26/20) 10/14/20 INITIAL  2 Pt will be able to ambulate 10 feet in parallel bars with max A from PT to reduce caregiver burden. Comments: not attempted (08/26/20) 10/14/20 INITIAL   LONG TERM GOALS:   LTG Name Target Date Goal status  1 Pt will be able to stand for 20 seconds with CGA and without UE support to improve standing balance Comments: cannot stand without support (08/26/20) 11/25/20 INITIAL  2 Pt will be able to ambulate 100 feet with max A from steady lift to improve WB through LE and improve gait pattern Comments: not attempted (08/26/20) 11/25/20 INITIAL  3 Pt will be able to perform 5x sit to stand with parallel bars/grab bars with min A from PT or caregiver to reduce caregiver burden. Comments: parallel bars + Mod A from PT (08/26/20) 11/25/20 INITIAL   PLAN: PT FREQUENCY: 1x/week  PT DURATION: 15 sessions  PLANNED INTERVENTIONS: Therapeutic exercises, Therapeutic activity, Neuro Muscular re-education, Balance training, Gait training, Patient/Family education, Joint mobilization, Orthotic/Fit training, Electrical stimulation, Wheelchair mobility training, Cryotherapy and Moist heat  PLAN FOR NEXT SESSION: Clarise Cruz lift walking, work on sit to stand, work on standing balance      Kerrie Pleasure, PT 09/08/2020, 1:22 PM    [facility] [address] [place], [place], [address] Phone: [phone]   Fax:  [phone]  Patient name: Robert Mcconnell MRN: [id number] DOB: 1921-08-08

## 2020-09-10 ENCOUNTER — Ambulatory Visit: Payer: Non-veteran care

## 2020-09-15 ENCOUNTER — Ambulatory Visit: Payer: Medicare Other

## 2020-09-15 ENCOUNTER — Other Ambulatory Visit: Payer: Self-pay

## 2020-09-15 DIAGNOSIS — M6281 Muscle weakness (generalized): Secondary | ICD-10-CM

## 2020-09-15 DIAGNOSIS — I69351 Hemiplegia and hemiparesis following cerebral infarction affecting right dominant side: Secondary | ICD-10-CM

## 2020-09-15 DIAGNOSIS — R2689 Other abnormalities of gait and mobility: Secondary | ICD-10-CM

## 2020-09-15 DIAGNOSIS — R2681 Unsteadiness on feet: Secondary | ICD-10-CM

## 2020-09-15 NOTE — Therapy (Signed)
OUTPATIENT PHYSICAL THERAPY TREATMENT NOTE   Patient Name: Robert Mcconnell MRN: [id number] DOB:[birth year], 87 y.o., male Today's Date: 09/15/2020  PCP: [name], MD REFERRING PROVIDER: Eliezer Lofts, [place]   PT End of Session - 09/15/20 1537    Visit Number 4    Number of Visits 15    Date for PT Re-Evaluation 11/25/20    Authorization Type Eval 08/26/20    Authorization Time Period VA authorization (15 visits from 07/29/20 to 11/26/20)    Authorization - Visit Number 4    Authorization - Number of Visits 15    Progress Note Due on Visit 15    PT Start Time 1230    PT Stop Time 1315    PT Time Calculation (min) 45 min    Equipment Utilized During Treatment Gait belt    Activity Tolerance Patient tolerated treatment well    Behavior During Therapy WFL for tasks assessed/performed           Past Medical History:  Diagnosis Date  . Atrial fibrillation ([place])   . BPH (benign prostatic hyperplasia)   . CKD (chronic kidney disease)   . History of coronary angioplasty with insertion of stent   . Hypertension   . Severe sepsis ([place]) 11/04/2019  . Stroke [facility])    Past Surgical History:  Procedure Laterality Date  . COLECTOMY  2006   cancer   Patient Active Problem List   Diagnosis Date Noted  . Dilated cardiomyopathy ([place]) 03/05/2020  . History of CVA (cerebrovascular accident) 03/05/2020  . Recurrent UTI 01/09/2020  . History of basal cell cancer 01/09/2020  . Chronic respiratory failure with hypoxia ([place]) 11/07/2019  . Pressure injury of skin 11/05/2019  . Acute renal failure superimposed on stage 4 chronic kidney disease ([place]) 11/04/2019  . Right hemiparesis ([place]) 11/04/2019  . Atrial fibrillation, chronic ([place]) 11/04/2019  . Dysphagia 11/04/2019  . 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 CKD (chronic kidney disease) stage 4, GFR 15-29 ml/min (HCC) 09/29/2019  . Thrombocytopenia ([place]) 09/29/2019  . Personal history of DVT  (deep vein thrombosis) 09/09/2019  . History of ischemic stroke 09/09/2019  . CAD (coronary artery disease) 09/09/2019  . Chronic constipation 09/09/2019  . History of colorectal cancer 09/09/2019  . 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 Right arm weakness 09/09/2019  . Right leg weakness 09/09/2019  . Pseudobulbar affect 09/09/2019    REFERRING DIAG: I63.9 (ICD-10-CM) - Cerebral infarction, unspecified     THERAPY DIAG:  Unsteadiness on feet  Muscle weakness (generalized)  Hemiplegia and hemiparesis following cerebral infarction affecting right dominant side (HCC)  Other abnormalities of gait and mobility  SUBJECTIVE: Son in Sports coach and caregiver present during the session. Nothing new to report.   PAIN:  Are you having pain? No     OBJECTIVE:  09/15/20: Used sara lift for sit to stand and for standing tolerance today: Sit to stand from power chair: max cueing for hand plancement and encouragement: trials 4, pt able to perform one partial standing  Gait training: max A  With Clarise Cruz lift and PT and rehab aide guiding sara lift: 1 x 15 steps, 1 x 5 steps, 1 x 2 steps, feet with manually blocking R knee and tactile cues for stepping with L LE   09/08/20: Used sara lift for sit to stand and for standing tolerance today: Sit to stand using Saralift: max A 4x Gait training: max A  With [employer] and PT and  rehab aide guiding sara lift: 1 x 3 steps, 1 x 2 steps, 1 x 2 steps, 1 x 10 feet with manually blocking R knee and tactile cues for stepping with L LE  09/01/20 Used sara lift for sit to stand and for standing tolerance today: Sit to stand using Saralift: max A 4x Standing with use of Saralift: max A, 60lbs unweighed using Sara lift, cues for locking knees, standing up straight, performed side to side weight shifts: 10x 4 x 3' Sit to stand using parallel bars: 4x, max A    ASSESSMENT:  CLINICAL IMPRESSION: Pt demonstrates improved walking endurance with saral lift  but requires total A x 2 with sara plus. Pt requires max encouragment and cueing for safe trasnfers.  REHAB POTENTIAL: Fair    CLINICAL DECISION MAKING: Stable/uncomplicated  EVALUATION COMPLEXITY: Moderate   GOALS: Goals reviewed with patient? Yes  SHORT TERM GOALS:  STG Name Target Date Goal status  1 Pt will be able to perform 5x sit to stand with parallel bars/grab bars with min A from PT or caregiver to reduce caregiver burden Comments: Parallel bar + mod A from PT (08/26/20) 10/14/20 INITIAL  2 Pt will be able to ambulate 10 feet in parallel bars with max A from PT to reduce caregiver burden. Comments: not attempted (08/26/20) 10/14/20 INITIAL   LONG TERM GOALS:   LTG Name Target Date Goal status  1 Pt will be able to stand for 20 seconds with CGA and without UE support to improve standing balance Comments: cannot stand without support (08/26/20) 11/25/20 INITIAL  2 Pt will be able to ambulate 100 feet with max A from steady lift to improve WB through LE and improve gait pattern Comments: not attempted (08/26/20) 11/25/20 INITIAL  3 Pt will be able to perform 5x sit to stand with parallel bars/grab bars with min A from PT or caregiver to reduce caregiver burden. Comments: parallel bars + Mod A from PT (08/26/20) 11/25/20 INITIAL   PLAN: PT FREQUENCY: 1x/week  PT DURATION: 15 sessions  PLANNED INTERVENTIONS: Therapeutic exercises, Therapeutic activity, Neuro Muscular re-education, Balance training, Gait training, Patient/Family education, Joint mobilization, Orthotic/Fit training, Electrical stimulation, Wheelchair mobility training, Cryotherapy and Moist heat  PLAN FOR NEXT SESSION: Clarise Cruz lift walking, work on sit to stand, work on standing balance      Kerrie Pleasure, PT 09/15/2020, 3:41 PM    [facility] [address] [place], [place], [address] Phone: [phone]   Fax:  [phone]  Patient  name: Robert Mcconnell MRN: [id number] DOB: 1922-01-10

## 2020-09-17 ENCOUNTER — Ambulatory Visit: Payer: Non-veteran care

## 2020-09-22 ENCOUNTER — Other Ambulatory Visit: Payer: Self-pay

## 2020-09-22 ENCOUNTER — Ambulatory Visit: Payer: Medicare Other

## 2020-09-22 DIAGNOSIS — M6281 Muscle weakness (generalized): Secondary | ICD-10-CM

## 2020-09-22 DIAGNOSIS — R2681 Unsteadiness on feet: Secondary | ICD-10-CM

## 2020-09-22 DIAGNOSIS — I69351 Hemiplegia and hemiparesis following cerebral infarction affecting right dominant side: Secondary | ICD-10-CM

## 2020-09-22 DIAGNOSIS — R2689 Other abnormalities of gait and mobility: Secondary | ICD-10-CM

## 2020-09-22 NOTE — Therapy (Signed)
OUTPATIENT PHYSICAL THERAPY TREATMENT NOTE   Patient Name: Robert Mcconnell MRN: [id number] DOB:1922/03/27, 87 y.o., male Today's Date: 09/22/2020  PCP: [name], MD REFERRING PROVIDER: Eliezer Lofts, FNP   PT End of Session - 09/22/20 1301    Visit Number 5    Number of Visits 15    Date for PT Re-Evaluation 11/25/20    Authorization Type Eval 08/26/20    Authorization Time Period [facility] authorization (15 visits from 07/29/20 to 11/26/20)    Authorization - Visit Number 5    Authorization - Number of Visits 15    Progress Note Due on Visit 15    PT Start Time 1230    PT Stop Time 1315    PT Time Calculation (min) 45 min    Equipment Utilized During Treatment Gait belt    Activity Tolerance Patient tolerated treatment well    Behavior During Therapy WFL for tasks assessed/performed           Past Medical History:  Diagnosis Date  . Atrial fibrillation ([place])   . BPH (benign prostatic hyperplasia)   . CKD (chronic kidney disease)   . History of coronary angioplasty with insertion of stent   . Hypertension   . Severe sepsis ([place]) 11/04/2019  . Stroke [facility])    Past Surgical History:  Procedure Laterality Date  . COLECTOMY  2006   cancer   Patient Active Problem List   Diagnosis Date Noted  . Dilated cardiomyopathy ([place]) 03/05/2020  . History of CVA (cerebrovascular accident) 03/05/2020  . Recurrent UTI 01/09/2020  . History of basal cell cancer 01/09/2020  . Chronic respiratory failure with hypoxia ([place]) 11/07/2019  . Pressure injury of skin 11/05/2019  . Acute renal failure superimposed on stage 4 chronic kidney disease ([place]) 11/04/2019  . Right hemiparesis ([place]) 11/04/2019  . Atrial fibrillation, chronic ([place]) 11/04/2019  . Dysphagia 11/04/2019  . 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 CKD (chronic kidney disease) stage 4, GFR 15-29 ml/min (HCC) 09/29/2019  . Thrombocytopenia ([place]) 09/29/2019  . Personal history of DVT  (deep vein thrombosis) 09/09/2019  . History of ischemic stroke 09/09/2019  . CAD (coronary artery disease) 09/09/2019  . Chronic constipation 09/09/2019  . History of colorectal cancer 09/09/2019  . 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 Right arm weakness 09/09/2019  . Right leg weakness 09/09/2019  . Pseudobulbar affect 09/09/2019    REFERRING DIAG: I63.9 (ICD-10-CM) - Cerebral infarction, unspecified     THERAPY DIAG:  Unsteadiness on feet  Muscle weakness (generalized)  Hemiplegia and hemiparesis following cerebral infarction affecting right dominant side (HCC)  Other abnormalities of gait and mobility  SUBJECTIVE: No new symptoms  PAIN:  Are you having pain? No     OBJECTIVE:  09/22/20: Chair to bed transfer: 4x towards his left side with max A and lots of verbal and tactile cues; used Hemi walker but needed second person to stabilize as patient tends to pull on handle of walker compared to push down in. Standing with hemi walker: 4 attempts: longest time pt stood was 10 seconds, pt has excessive lean to R side so requires max A for balance and total A for eccentric sitting down. Scooting at edge of bed: 4 feet to R and L with min A to scoot but max A to move R LE left and right and max cueing.  09/15/20: Used sara lift for sit to stand and for standing tolerance today: Sit to stand from  power chair: max cueing for hand plancement and encouragement: trials 4, pt able to perform one partial standing  Gait training: max A  With Clarise Cruz lift and PT and rehab aide guiding sara lift: 1 x 15 steps, 1 x 5 steps, 1 x 2 steps, feet with manually blocking R knee and tactile cues for stepping with L LE   09/08/20: Used sara lift for sit to stand and for standing tolerance today: Sit to stand using Saralift: max A 4x Gait training: max A  With Clarise Cruz lift and PT and rehab aide guiding sara lift: 1 x 3 steps, 1 x 2 steps, 1 x 2 steps, 1 x 10 feet with manually blocking R knee and  tactile cues for stepping with L LE  09/01/20 Used sara lift for sit to stand and for standing tolerance today: Sit to stand using Saralift: max A 4x Standing with use of Saralift: max A, 60lbs unweighed using Sara lift, cues for locking knees, standing up straight, performed side to side weight shifts: 10x 4 x 3' Sit to stand using parallel bars: 4x, max A    ASSESSMENT:  CLINICAL IMPRESSION: We focused on scooting lateral and scooting back in chair today. We practiced chair to chair tranfsers with decreasing assistance to improve indpendence and reduce caregiver burden. We also practiced standing with hemi walker to improve WB to L LE and improve standing balance with lesser support.  REHAB POTENTIAL: Fair    CLINICAL DECISION MAKING: Stable/uncomplicated  EVALUATION COMPLEXITY: Moderate   GOALS: Goals reviewed with patient? Yes  SHORT TERM GOALS:  STG Name Target Date Goal status  1 Pt will be able to perform 5x sit to stand with parallel bars/grab bars with min A from PT or caregiver to reduce caregiver burden Comments: Parallel bar + mod A from PT (08/26/20) 10/14/20 INITIAL  2 Pt will be able to ambulate 10 feet in parallel bars with max A from PT to reduce caregiver burden. Comments: not attempted (08/26/20) 10/14/20 INITIAL   LONG TERM GOALS:   LTG Name Target Date Goal status  1 Pt will be able to stand for 20 seconds with CGA and without UE support to improve standing balance Comments: cannot stand without support (08/26/20) 11/25/20 INITIAL  2 Pt will be able to ambulate 100 feet with max A from steady lift to improve WB through LE and improve gait pattern Comments: not attempted (08/26/20) 11/25/20 INITIAL  3 Pt will be able to perform 5x sit to stand with parallel bars/grab bars with min A from PT or caregiver to reduce caregiver burden. Comments: parallel bars + Mod A from PT (08/26/20) 11/25/20 INITIAL   PLAN: PT FREQUENCY: 1x/week  PT DURATION: 15  sessions  PLANNED INTERVENTIONS: Therapeutic exercises, Therapeutic activity, Neuro Muscular re-education, Balance training, Gait training, Patient/Family education, Joint mobilization, Orthotic/Fit training, Electrical stimulation, Wheelchair mobility training, Cryotherapy and Moist heat  PLAN FOR NEXT SESSION: Clarise Cruz lift walking, work on sit to stand, work on standing balance      Kerrie Pleasure, PT 09/22/2020, 1:01 PM    [place] [address] [place], [place], [address] Phone: [phone]   Fax:  [phone]  Patient name: Robert Mcconnell MRN: [id number] DOB: January 24, 1922

## 2020-09-24 ENCOUNTER — Ambulatory Visit: Payer: Non-veteran care

## 2020-09-29 ENCOUNTER — Ambulatory Visit: Payer: Medicare Other

## 2020-09-29 ENCOUNTER — Other Ambulatory Visit: Payer: Self-pay

## 2020-09-29 DIAGNOSIS — R2681 Unsteadiness on feet: Secondary | ICD-10-CM | POA: Diagnosis not present

## 2020-09-29 DIAGNOSIS — M6281 Muscle weakness (generalized): Secondary | ICD-10-CM

## 2020-09-29 DIAGNOSIS — I69351 Hemiplegia and hemiparesis following cerebral infarction affecting right dominant side: Secondary | ICD-10-CM

## 2020-09-29 DIAGNOSIS — R2689 Other abnormalities of gait and mobility: Secondary | ICD-10-CM

## 2020-09-29 NOTE — Therapy (Signed)
OUTPATIENT PHYSICAL THERAPY TREATMENT NOTE   Patient Name: Robert Mcconnell MRN: [id number] DOB:03-06-1922, 87 y.o., male Today's Date: 09/29/2020  PCP: [name], MD REFERRING PROVIDER: Eliezer Lofts, FNP   PT End of Session - 09/29/20 1424    Visit Number 6    Number of Visits 15    Date for PT Re-Evaluation 11/25/20    Authorization Type Eval 08/26/20    Authorization Time Period [facility] authorization (15 visits from 07/29/20 to 11/26/20)    Authorization - Visit Number 6    Authorization - Number of Visits 15    Progress Note Due on Visit 15    PT Start Time 1230    PT Stop Time 1315    PT Time Calculation (min) 45 min    Equipment Utilized During Treatment Gait belt    Activity Tolerance Patient tolerated treatment well    Behavior During Therapy WFL for tasks assessed/performed           Past Medical History:  Diagnosis Date  . Atrial fibrillation ([place])   . BPH (benign prostatic hyperplasia)   . CKD (chronic kidney disease)   . History of coronary angioplasty with insertion of stent   . Hypertension   . Severe sepsis ([place]) 11/04/2019  . Stroke [facility])    Past Surgical History:  Procedure Laterality Date  . COLECTOMY  2006   cancer   Patient Active Problem List   Diagnosis Date Noted  . Dilated cardiomyopathy ([place]) 03/05/2020  . History of CVA (cerebrovascular accident) 03/05/2020  . Recurrent UTI 01/09/2020  . History of basal cell cancer 01/09/2020  . Chronic respiratory failure with hypoxia ([place]) 11/07/2019  . Pressure injury of skin 11/05/2019  . Acute renal failure superimposed on stage 4 chronic kidney disease ([place]) 11/04/2019  . Right hemiparesis ([place]) 11/04/2019  . Atrial fibrillation, chronic ([place]) 11/04/2019  . Dysphagia 11/04/2019  . 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 CKD (chronic kidney disease) stage 4, GFR 15-29 ml/min (HCC) 09/29/2019  . Thrombocytopenia ([place]) 09/29/2019  . Personal history of DVT  (deep vein thrombosis) 09/09/2019  . History of ischemic stroke 09/09/2019  . CAD (coronary artery disease) 09/09/2019  . Chronic constipation 09/09/2019  . History of colorectal cancer 09/09/2019  . 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 Right arm weakness 09/09/2019  . Right leg weakness 09/09/2019  . Pseudobulbar affect 09/09/2019    REFERRING DIAG: I63.9 (ICD-10-CM) - Cerebral infarction, unspecified     THERAPY DIAG:  Unsteadiness on feet  Muscle weakness (generalized)  Hemiplegia and hemiparesis following cerebral infarction affecting right dominant side (HCC)  Other abnormalities of gait and mobility  SUBJECTIVE: No new symptoms. When he goes upstairs, they transfer him from stair lift to rollator and push him in the rollator to his bed and transfer him back to bed.  PAIN:  Are you having pain? No     OBJECTIVE:  09/29/20: Power chair to mat transfer: Transfer surface height kept lower than sitting surface for ease of transfer. Pt asked to scoot towards his left using his L arm. Pt required max verbal cueing and min A to perform scoot transfer. Pt was also transferred back from mat to power chair with min to mod A due to softer power chair cushion and decreased place for patient to put his L UE on to push to scoot. Sit to stand: using a rollator. Used a rollator as patient is more familiar with this device as they  use it everyday at home. R UE stabilized on R handle bar by therapist. With elevated mat, pt asked to stand up: performed 5x with mod A, once standing, pt has excessive lean to R and requires mod to max A to stand. Pt able to stand for 2-3 seconds only before he starts to lean heavily towards his R side.  09/22/20: Chair to bed transfer: 4x towards his left side with max A and lots of verbal and tactile cues; used Hemi walker but needed second person to stabilize as patient tends to pull on handle of walker compared to push down in. Standing with hemi  walker: 4 attempts: longest time pt stood was 10 seconds, pt has excessive lean to R side so requires max A for balance and total A for eccentric sitting down. Scooting at edge of bed: 4 feet to R and L with min A to scoot but max A to move R LE left and right and max cueing.  09/15/20: Used sara lift for sit to stand and for standing tolerance today: Sit to stand from power chair: max cueing for hand plancement and encouragement: trials 4, pt able to perform one partial standing  Gait training: max A  With Clarise Cruz lift and PT and rehab aide guiding sara lift: 1 x 15 steps, 1 x 5 steps, 1 x 2 steps, feet with manually blocking R knee and tactile cues for stepping with L LE   09/08/20: Used sara lift for sit to stand and for standing tolerance today: Sit to stand using Saralift: max A 4x Gait training: max A  With Clarise Cruz lift and PT and rehab aide guiding sara lift: 1 x 3 steps, 1 x 2 steps, 1 x 2 steps, 1 x 10 feet with manually blocking R knee and tactile cues for stepping with L LE  09/01/20 Used sara lift for sit to stand and for standing tolerance today: Sit to stand using Saralift: max A 4x Standing with use of Saralift: max A, 60lbs unweighed using Sara lift, cues for locking knees, standing up straight, performed side to side weight shifts: 10x 4 x 3' Sit to stand using parallel bars: 4x, max A    ASSESSMENT:  CLINICAL IMPRESSION: We continued our focus on pushing down with L UE to improve WB through L UE and to promote leanding to Left to work against excessive R lean with transfers and standing.  REHAB POTENTIAL: Fair    CLINICAL DECISION MAKING: Stable/uncomplicated  EVALUATION COMPLEXITY: Moderate   GOALS: Goals reviewed with patient? Yes  SHORT TERM GOALS:  STG Name Target Date Goal status  1 Pt will be able to perform 5x sit to stand with parallel bars/grab bars with min A from PT or caregiver to reduce caregiver burden Comments: Parallel bar + mod A from PT  (08/26/20) 10/14/20 INITIAL  2 Pt will be able to ambulate 10 feet in parallel bars with max A from PT to reduce caregiver burden. Comments: not attempted (08/26/20) 10/14/20 INITIAL   LONG TERM GOALS:   LTG Name Target Date Goal status  1 Pt will be able to stand for 20 seconds with CGA and without UE support to improve standing balance Comments: cannot stand without support (08/26/20) 11/25/20 INITIAL  2 Pt will be able to ambulate 100 feet with max A from steady lift to improve WB through LE and improve gait pattern Comments: not attempted (08/26/20) 11/25/20 INITIAL  3 Pt will be able to perform 5x sit to stand  with parallel bars/grab bars with min A from PT or caregiver to reduce caregiver burden. Comments: parallel bars + Mod A from PT (08/26/20) 11/25/20 INITIAL   PLAN: PT FREQUENCY: 1x/week  PT DURATION: 15 sessions  PLANNED INTERVENTIONS: Therapeutic exercises, Therapeutic activity, Neuro Muscular re-education, Balance training, Gait training, Patient/Family education, Joint mobilization, Orthotic/Fit training, Electrical stimulation, Wheelchair mobility training, Cryotherapy and Moist heat  PLAN FOR NEXT SESSION: Clarise Cruz lift walking, work on sit to stand, work on standing balance      Kerrie Pleasure, PT 09/29/2020, 2:24 PM    [place] [address] [place], [place], [address] Phone: [phone]   Fax:  [phone]  Patient name: Erby Canseco MRN: [id number] DOB: 1921-06-16

## 2020-10-01 ENCOUNTER — Ambulatory Visit: Payer: Non-veteran care

## 2020-10-06 ENCOUNTER — Ambulatory Visit: Payer: Medicare Other

## 2020-10-06 ENCOUNTER — Inpatient Hospital Stay (HOSPITAL_COMMUNITY)
Admission: EM | Admit: 2020-10-06 | Discharge: 2020-11-05 | DRG: 871 | Disposition: E | Payer: No Typology Code available for payment source | Source: Ambulatory Visit | Attending: Internal Medicine | Admitting: Internal Medicine

## 2020-10-06 ENCOUNTER — Emergency Department (HOSPITAL_COMMUNITY): Payer: No Typology Code available for payment source

## 2020-10-06 DIAGNOSIS — Z79890 Hormone replacement therapy (postmenopausal): Secondary | ICD-10-CM

## 2020-10-06 DIAGNOSIS — Z955 Presence of coronary angioplasty implant and graft: Secondary | ICD-10-CM

## 2020-10-06 DIAGNOSIS — E039 Hypothyroidism, unspecified: Secondary | ICD-10-CM

## 2020-10-06 DIAGNOSIS — J189 Pneumonia, unspecified organism: Secondary | ICD-10-CM | POA: Diagnosis present

## 2020-10-06 DIAGNOSIS — N184 Chronic kidney disease, stage 4 (severe): Secondary | ICD-10-CM | POA: Diagnosis present

## 2020-10-06 DIAGNOSIS — J9621 Acute and chronic respiratory failure with hypoxia: Secondary | ICD-10-CM | POA: Diagnosis present

## 2020-10-06 DIAGNOSIS — A419 Sepsis, unspecified organism: Secondary | ICD-10-CM | POA: Diagnosis not present

## 2020-10-06 DIAGNOSIS — E43 Unspecified severe protein-calorie malnutrition: Secondary | ICD-10-CM | POA: Diagnosis present

## 2020-10-06 DIAGNOSIS — Z66 Do not resuscitate: Secondary | ICD-10-CM | POA: Diagnosis present

## 2020-10-06 DIAGNOSIS — R6520 Severe sepsis without septic shock: Secondary | ICD-10-CM | POA: Diagnosis present

## 2020-10-06 DIAGNOSIS — I69391 Dysphagia following cerebral infarction: Secondary | ICD-10-CM

## 2020-10-06 DIAGNOSIS — I420 Dilated cardiomyopathy: Secondary | ICD-10-CM | POA: Diagnosis present

## 2020-10-06 DIAGNOSIS — E872 Acidosis: Secondary | ICD-10-CM | POA: Diagnosis present

## 2020-10-06 DIAGNOSIS — Z20822 Contact with and (suspected) exposure to covid-19: Secondary | ICD-10-CM | POA: Diagnosis present

## 2020-10-06 DIAGNOSIS — I5023 Acute on chronic systolic (congestive) heart failure: Secondary | ICD-10-CM | POA: Diagnosis present

## 2020-10-06 DIAGNOSIS — Z7901 Long term (current) use of anticoagulants: Secondary | ICD-10-CM

## 2020-10-06 DIAGNOSIS — Z515 Encounter for palliative care: Secondary | ICD-10-CM

## 2020-10-06 DIAGNOSIS — D696 Thrombocytopenia, unspecified: Secondary | ICD-10-CM | POA: Diagnosis present

## 2020-10-06 DIAGNOSIS — R54 Age-related physical debility: Secondary | ICD-10-CM

## 2020-10-06 DIAGNOSIS — R1310 Dysphagia, unspecified: Secondary | ICD-10-CM

## 2020-10-06 DIAGNOSIS — G8191 Hemiplegia, unspecified affecting right dominant side: Secondary | ICD-10-CM

## 2020-10-06 DIAGNOSIS — J9611 Chronic respiratory failure with hypoxia: Secondary | ICD-10-CM | POA: Diagnosis present

## 2020-10-06 DIAGNOSIS — I6930 Unspecified sequelae of cerebral infarction: Secondary | ICD-10-CM

## 2020-10-06 DIAGNOSIS — R609 Edema, unspecified: Secondary | ICD-10-CM | POA: Diagnosis not present

## 2020-10-06 DIAGNOSIS — J690 Pneumonitis due to inhalation of food and vomit: Secondary | ICD-10-CM | POA: Diagnosis present

## 2020-10-06 DIAGNOSIS — Z4659 Encounter for fitting and adjustment of other gastrointestinal appliance and device: Secondary | ICD-10-CM

## 2020-10-06 DIAGNOSIS — N400 Enlarged prostate without lower urinary tract symptoms: Secondary | ICD-10-CM | POA: Diagnosis present

## 2020-10-06 DIAGNOSIS — E785 Hyperlipidemia, unspecified: Secondary | ICD-10-CM | POA: Diagnosis present

## 2020-10-06 DIAGNOSIS — I69351 Hemiplegia and hemiparesis following cerebral infarction affecting right dominant side: Secondary | ICD-10-CM | POA: Diagnosis not present

## 2020-10-06 DIAGNOSIS — I482 Chronic atrial fibrillation, unspecified: Secondary | ICD-10-CM | POA: Diagnosis present

## 2020-10-06 DIAGNOSIS — I130 Hypertensive heart and chronic kidney disease with heart failure and stage 1 through stage 4 chronic kidney disease, or unspecified chronic kidney disease: Secondary | ICD-10-CM | POA: Diagnosis present

## 2020-10-06 DIAGNOSIS — J9602 Acute respiratory failure with hypercapnia: Secondary | ICD-10-CM | POA: Diagnosis not present

## 2020-10-06 DIAGNOSIS — E860 Dehydration: Secondary | ICD-10-CM | POA: Diagnosis present

## 2020-10-06 DIAGNOSIS — R0902 Hypoxemia: Secondary | ICD-10-CM

## 2020-10-06 DIAGNOSIS — G9341 Metabolic encephalopathy: Secondary | ICD-10-CM | POA: Diagnosis present

## 2020-10-06 DIAGNOSIS — J9601 Acute respiratory failure with hypoxia: Secondary | ICD-10-CM | POA: Diagnosis not present

## 2020-10-06 DIAGNOSIS — E870 Hyperosmolality and hypernatremia: Secondary | ICD-10-CM | POA: Diagnosis not present

## 2020-10-06 DIAGNOSIS — Z6824 Body mass index (BMI) 24.0-24.9, adult: Secondary | ICD-10-CM | POA: Diagnosis not present

## 2020-10-06 DIAGNOSIS — L89152 Pressure ulcer of sacral region, stage 2: Secondary | ICD-10-CM | POA: Diagnosis present

## 2020-10-06 DIAGNOSIS — N179 Acute kidney failure, unspecified: Secondary | ICD-10-CM | POA: Diagnosis not present

## 2020-10-06 DIAGNOSIS — Z87440 Personal history of urinary (tract) infections: Secondary | ICD-10-CM

## 2020-10-06 DIAGNOSIS — Z7189 Other specified counseling: Secondary | ICD-10-CM | POA: Diagnosis not present

## 2020-10-06 DIAGNOSIS — J9622 Acute and chronic respiratory failure with hypercapnia: Secondary | ICD-10-CM | POA: Diagnosis present

## 2020-10-06 DIAGNOSIS — Z79899 Other long term (current) drug therapy: Secondary | ICD-10-CM

## 2020-10-06 DIAGNOSIS — F419 Anxiety disorder, unspecified: Secondary | ICD-10-CM | POA: Diagnosis present

## 2020-10-06 DIAGNOSIS — Z86718 Personal history of other venous thrombosis and embolism: Secondary | ICD-10-CM

## 2020-10-06 LAB — CBC WITH DIFFERENTIAL/PLATELET
Abs Immature Granulocytes: 0.02 10*3/uL (ref 0.00–0.07)
Basophils Absolute: 0 10*3/uL (ref 0.0–0.1)
Basophils Relative: 0 %
Eosinophils Absolute: 0 10*3/uL (ref 0.0–0.5)
Eosinophils Relative: 0 %
HCT: 44 % (ref 39.0–52.0)
Hemoglobin: 13 g/dL (ref 13.0–17.0)
Immature Granulocytes: 1 %
Lymphocytes Relative: 9 %
Lymphs Abs: 0.3 10*3/uL — ABNORMAL LOW (ref 0.7–4.0)
MCH: 30 pg (ref 26.0–34.0)
MCHC: 29.5 g/dL — ABNORMAL LOW (ref 30.0–36.0)
MCV: 101.4 fL — ABNORMAL HIGH (ref 80.0–100.0)
Monocytes Absolute: 0.3 10*3/uL (ref 0.1–1.0)
Monocytes Relative: 7 %
Neutro Abs: 3 10*3/uL (ref 1.7–7.7)
Neutrophils Relative %: 83 %
Platelets: 86 10*3/uL — ABNORMAL LOW (ref 150–400)
RBC: 4.34 MIL/uL (ref 4.22–5.81)
RDW: 16.8 % — ABNORMAL HIGH (ref 11.5–15.5)
WBC: 3.6 10*3/uL — ABNORMAL LOW (ref 4.0–10.5)
nRBC: 0 % (ref 0.0–0.2)

## 2020-10-06 LAB — COMPREHENSIVE METABOLIC PANEL
ALT: 13 U/L (ref 0–44)
AST: 22 U/L (ref 15–41)
Albumin: 2.7 g/dL — ABNORMAL LOW (ref 3.5–5.0)
Alkaline Phosphatase: 52 U/L (ref 38–126)
Anion gap: 5 (ref 5–15)
BUN: 32 mg/dL — ABNORMAL HIGH (ref 8–23)
CO2: 29 mmol/L (ref 22–32)
Calcium: 8.4 mg/dL — ABNORMAL LOW (ref 8.9–10.3)
Chloride: 109 mmol/L (ref 98–111)
Creatinine, Ser: 1.76 mg/dL — ABNORMAL HIGH (ref 0.61–1.24)
GFR, Estimated: 35 mL/min — ABNORMAL LOW (ref >60)
Glucose, Bld: 153 mg/dL — ABNORMAL HIGH (ref 70–99)
Potassium: 3.8 mmol/L (ref 3.5–5.1)
Sodium: 143 mmol/L (ref 135–145)
Total Bilirubin: 0.7 mg/dL (ref 0.3–1.2)
Total Protein: 6.6 g/dL (ref 6.5–8.1)

## 2020-10-06 LAB — I-STAT ARTERIAL BLOOD GAS, ED
Acid-Base Excess: 2 mmol/L (ref 0.0–2.0)
Bicarbonate: 29 mmol/L — ABNORMAL HIGH (ref 20.0–28.0)
Calcium, Ion: 1.22 mmol/L (ref 1.15–1.40)
HCT: 36 % — ABNORMAL LOW (ref 39.0–52.0)
Hemoglobin: 12.2 g/dL — ABNORMAL LOW (ref 13.0–17.0)
O2 Saturation: 99 %
Patient temperature: 97.8
Potassium: 3.6 mmol/L (ref 3.5–5.1)
Sodium: 146 mmol/L — ABNORMAL HIGH (ref 135–145)
TCO2: 31 mmol/L (ref 22–32)
pCO2 arterial: 55.6 mmHg — ABNORMAL HIGH (ref 32.0–48.0)
pH, Arterial: 7.323 — ABNORMAL LOW (ref 7.350–7.450)
pO2, Arterial: 143 mmHg — ABNORMAL HIGH (ref 83.0–108.0)

## 2020-10-06 LAB — TROPONIN I (HIGH SENSITIVITY)
Troponin I (High Sensitivity): 44 ng/L — ABNORMAL HIGH (ref <18)
Troponin I (High Sensitivity): 54 ng/L — ABNORMAL HIGH (ref <18)

## 2020-10-06 LAB — RESP PANEL BY RT-PCR (FLU A&B, COVID) ARPGX2
Influenza A by PCR: NEGATIVE
Influenza B by PCR: NEGATIVE
SARS Coronavirus 2 by RT PCR: NEGATIVE

## 2020-10-06 LAB — LACTIC ACID, PLASMA
Lactic Acid, Venous: 2 mmol/L (ref 0.5–1.9)
Lactic Acid, Venous: 1.1 mmol/L (ref 0.5–1.9)

## 2020-10-06 LAB — TYPE AND SCREEN
ABO/RH(D): O POS
Antibody Screen: NEGATIVE

## 2020-10-06 LAB — PROTIME-INR
INR: 1.3 — ABNORMAL HIGH (ref 0.8–1.2)
Prothrombin Time: 16.5 seconds — ABNORMAL HIGH (ref 11.4–15.2)

## 2020-10-06 LAB — PROCALCITONIN: Procalcitonin: 0.28 ng/mL

## 2020-10-06 LAB — TSH: TSH: 3.168 u[IU]/mL (ref 0.350–4.500)

## 2020-10-06 LAB — BRAIN NATRIURETIC PEPTIDE: B Natriuretic Peptide: 986.1 pg/mL — ABNORMAL HIGH (ref 0.0–100.0)

## 2020-10-06 MED ORDER — SENNA 8.6 MG PO TABS: 1.0000 | ORAL_TABLET | Freq: Every day | ORAL | Status: DC

## 2020-10-06 MED ORDER — MIRTAZAPINE 15 MG PO TABS: 15.0000 mg | ORAL_TABLET | Freq: Every day | ORAL | Status: DC

## 2020-10-06 MED ORDER — FINASTERIDE 5 MG PO TABS: 5.0000 mg | ORAL_TABLET | Freq: Every day | ORAL | Status: DC

## 2020-10-06 MED ORDER — ONDANSETRON HCL 4 MG/2ML IJ SOLN: 4.0000 mg | Freq: Four times a day (QID) | INTRAMUSCULAR | Status: DC | PRN

## 2020-10-06 MED ORDER — TERAZOSIN HCL 5 MG PO CAPS
5.0000 mg | ORAL_CAPSULE | Freq: Two times a day (BID) | ORAL | Status: DC
  Filled 2020-10-06 (×4): qty 1

## 2020-10-06 MED ORDER — SODIUM CHLORIDE 0.9% FLUSH
3.0000 mL | Freq: Two times a day (BID) | INTRAVENOUS | Status: DC
  Administered 2020-10-06 – 2020-10-13 (×11): 3 mL via INTRAVENOUS

## 2020-10-06 MED ORDER — VANCOMYCIN HCL 1250 MG/250ML IV SOLN: 1250.0000 mg | INTRAVENOUS | Status: DC

## 2020-10-06 MED ORDER — FAMOTIDINE 20 MG PO TABS: 20.0000 mg | ORAL_TABLET | Freq: Every day | ORAL | Status: DC

## 2020-10-06 MED ORDER — LEVOTHYROXINE SODIUM 25 MCG PO TABS: 25.0000 ug | ORAL_TABLET | Freq: Every day | ORAL | Status: DC

## 2020-10-06 MED ORDER — ACETAMINOPHEN 325 MG PO TABS: 650.0000 mg | ORAL_TABLET | ORAL | Status: DC | PRN

## 2020-10-06 MED ORDER — VANCOMYCIN HCL 1500 MG/300ML IV SOLN
1500.0000 mg | Freq: Once | INTRAVENOUS | Status: AC
  Administered 2020-10-06: 1500 mg via INTRAVENOUS
  Filled 2020-10-06: qty 300

## 2020-10-06 MED ORDER — SODIUM CHLORIDE 0.9 % IV SOLN
2.0000 g | INTRAVENOUS | Status: AC
  Administered 2020-10-07 – 2020-10-11 (×5): 2 g via INTRAVENOUS
  Filled 2020-10-06: qty 2
  Filled 2020-10-06: qty 20
  Filled 2020-10-06 (×3): qty 2

## 2020-10-06 MED ORDER — APIXABAN 2.5 MG PO TABS: 2.5000 mg | ORAL_TABLET | Freq: Two times a day (BID) | ORAL | Status: DC

## 2020-10-06 MED ORDER — CALCITRIOL 1 MCG/ML PO SOLN
0.2500 ug | ORAL | Status: DC
  Filled 2020-10-06: qty 0.25

## 2020-10-06 MED ORDER — SODIUM CHLORIDE 0.9 % IV SOLN
2.0000 g | Freq: Once | INTRAVENOUS | Status: AC
  Administered 2020-10-06: 2 g via INTRAVENOUS
  Filled 2020-10-06: qty 2

## 2020-10-06 MED ORDER — SODIUM CHLORIDE 0.9% FLUSH: 3.0000 mL | INTRAVENOUS | Status: DC | PRN

## 2020-10-06 MED ORDER — SODIUM CHLORIDE 0.9 % IV SOLN
250.0000 mL | INTRAVENOUS | Status: DC | PRN
  Administered 2020-10-07: 250 mL via INTRAVENOUS

## 2020-10-06 MED ORDER — METOPROLOL SUCCINATE ER 25 MG PO TB24: 25.0000 mg | ORAL_TABLET | Freq: Every day | ORAL | Status: DC

## 2020-10-06 MED ORDER — AZITHROMYCIN 500 MG IV SOLR
500.0000 mg | INTRAVENOUS | Status: AC
  Administered 2020-10-07 – 2020-10-10 (×4): 500 mg via INTRAVENOUS
  Filled 2020-10-06 (×4): qty 500

## 2020-10-06 MED ORDER — DOCUSATE SODIUM 50 MG/5ML PO LIQD: 150.0000 mg | Freq: Every day | ORAL | Status: DC

## 2020-10-06 MED ORDER — FUROSEMIDE 10 MG/ML IJ SOLN
20.0000 mg | Freq: Two times a day (BID) | INTRAMUSCULAR | Status: DC
  Administered 2020-10-06 – 2020-10-07 (×3): 20 mg via INTRAVENOUS
  Filled 2020-10-06 (×4): qty 2

## 2020-10-06 MED ORDER — ATORVASTATIN CALCIUM 40 MG PO TABS: 40.0000 mg | ORAL_TABLET | Freq: Every day | ORAL | Status: DC

## 2020-10-06 MED ORDER — SODIUM CHLORIDE 0.9 % IV SOLN: 2.0000 g | INTRAVENOUS | Status: DC

## 2020-10-06 NOTE — ED Provider Notes (Signed)
[facility] EMERGENCY DEPARTMENT Provider Note   CSN: [id number] Arrival date & time: 10/27/2020  1231     History No chief complaint on file.   Robert Mcconnell is a 87 y.o. male.  87 year old male with prior medical history as detailed below presents for evaluation.  Patient was taken to urgent care today by his daughter.  Patient with temperatures at home up to 99 degrees overnight.  Patient with increased upper respiratory noise.  Symptoms began over night as well.  Patient with evidence of mild respiratory distress per family.  Urgent care noted the patient was hypoxic into the 70's.  This was noted to be on room air.  EMS transfer the patient to the ED on the 100% O2 by facemask.  Level 5 caveat.  Patient is unable to provide significant history.  Patient's daughter is at bedside.  She confirms the patient is DNR/DNI.  She provides majority of history obtained.  The history is provided by the patient, the EMS personnel, a relative and medical records.  Illness Location:  Hypoxia, respiratory distress Severity:  Moderate Onset quality:  Gradual Duration:  2 days Timing:  Constant Progression:  Worsening Chronicity:  New      Past Medical History:  Diagnosis Date  . Atrial fibrillation ([place])   . BPH (benign prostatic hyperplasia)   . CKD (chronic kidney disease)   . History of coronary angioplasty with insertion of stent   . Hypertension   . Severe sepsis ([place]) 11/04/2019  . Stroke [facility])     Patient Active Problem List   Diagnosis Date Noted  . Dilated cardiomyopathy ([place]) 03/05/2020  . History of CVA (cerebrovascular accident) 03/05/2020  . Recurrent UTI 01/09/2020  . History of basal cell cancer 01/09/2020  . Chronic respiratory failure with hypoxia ([place]) 11/07/2019  . Pressure injury of skin 11/05/2019  . Acute renal failure superimposed on stage 4 chronic kidney disease ([place]) 11/04/2019  . Right hemiparesis ([place]) 11/04/2019  . Atrial  fibrillation, chronic ([place]) 11/04/2019  . Dysphagia 11/04/2019  . 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 CKD (chronic kidney disease) stage 4, GFR 15-29 ml/min (HCC) 09/29/2019  . Thrombocytopenia ([place]) 09/29/2019  . Personal history of DVT (deep vein thrombosis) 09/09/2019  . History of ischemic stroke 09/09/2019  . CAD (coronary artery disease) 09/09/2019  . Chronic constipation 09/09/2019  . History of colorectal cancer 09/09/2019  . 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 Right arm weakness 09/09/2019  . Right leg weakness 09/09/2019  . Pseudobulbar affect 09/09/2019    Past Surgical History:  Procedure Laterality Date  . COLECTOMY  2006   cancer       No family history on file.  Social History   Tobacco Use  . Smoking status: Never Smoker  . Smokeless tobacco: Never Used  Substance Use Topics  . Alcohol use: Yes    Comment: occasionally  . Drug use: Never    Home Medications Prior to Admission medications   Medication Sig Start Date End Date Taking? Authorizing Provider  apixaban (ELIQUIS) 2.5 MG TABS tablet Take 2.5 mg by mouth 2 (two) times daily.     [provider]  atorvastatin (LIPITOR) 40 MG tablet Take 40 mg by mouth at bedtime.     [provider]  calcitRIOL (ROCALTROL) 1 MCG/ML solution Take 0.25 mcg by mouth every Monday, Wednesday, and Friday.     [provider]  Docusate Sodium 150 MG/15ML syrup Take  150 mg by mouth daily.     [provider]  famotidine (PEPCID) 20 MG tablet Take 20 mg by mouth daily.     [provider]  finasteride (PROSCAR) 5 MG tablet Take 5 mg by mouth daily.    [provider]  levothyroxine (SYNTHROID) 25 MCG tablet Take 25 mcg by mouth daily before breakfast.    [provider]  melatonin 3 MG TABS tablet Take 9 mg by mouth at bedtime.    [provider]  metoprolol succinate (TOPROL-XL) 25 MG 24 hr tablet Take 25  mg by mouth daily.    [provider]  mirtazapine (REMERON) 15 MG tablet Take 15 mg by mouth at bedtime.    [provider]  senna (SENOKOT) 8.6 MG TABS tablet Take 1 tablet by mouth daily.    [provider]  terazosin (HYTRIN) 5 MG capsule Take 5 mg by mouth 2 (two) times daily.     [provider]  Zinc Sulfate (ZINC 15 PO) Take 15 mg by mouth 2 (two) times daily.    [provider]    Allergies    Patient has no known allergies.  Review of Systems   Review of Systems  All other systems reviewed and are negative.   Physical Exam Updated Vital Signs There were no vitals taken for this visit.  Physical Exam Vitals and nursing note reviewed.  Constitutional:      General: He is in acute distress.     Appearance: He is well-developed. He is ill-appearing.  HENT:     Head: Normocephalic and atraumatic.  Eyes:     Conjunctiva/sclera: Conjunctivae normal.     Pupils: Pupils are equal, round, and reactive to light.  Cardiovascular:     Rate and Rhythm: Normal rate and regular rhythm.     Heart sounds: Normal heart sounds.  Pulmonary:     Effort: Pulmonary effort is normal. No respiratory distress.     Breath sounds: Normal breath sounds.  Abdominal:     General: There is no distension.     Palpations: Abdomen is soft.     Tenderness: There is no abdominal tenderness.  Musculoskeletal:        General: No deformity. Normal range of motion.     Cervical back: Normal range of motion and neck supple.     Right lower leg: Edema present.     Left lower leg: Edema present.     Comments: 1-2 plus bilateral edema  Skin:    General: Skin is warm and dry.  Neurological:     Mental Status: He is alert and oriented to person, place, and time.     ED Results / Procedures / Treatments   Labs (all labs ordered are listed, but only abnormal results are displayed) Labs Reviewed  CBC WITH DIFFERENTIAL/PLATELET - Abnormal; Notable for the  following components:      Result Value   WBC 3.6 (*)    MCV 101.4 (*)    MCHC 29.5 (*)    RDW 16.8 (*)    Platelets 86 (*)    Lymphs Abs 0.3 (*)    All other components within normal limits  PROTIME-INR - Abnormal; Notable for the following components:   Prothrombin Time 16.5 (*)    INR 1.3 (*)    All other components within normal limits  LACTIC ACID, PLASMA - Abnormal; Notable for the following components:   Lactic Acid, Venous 2.0 (*)  All other components within normal limits  BRAIN NATRIURETIC PEPTIDE - Abnormal; Notable for the following components:   B Natriuretic Peptide 986.1 (*)    All other components within normal limits  COMPREHENSIVE METABOLIC PANEL - Abnormal; Notable for the following components:   Glucose, Bld 153 (*)    BUN 32 (*)    Creatinine, Ser 1.76 (*)    Calcium 8.4 (*)    Albumin 2.7 (*)    GFR, Estimated 35 (*)    All other components within normal limits  I-STAT ARTERIAL BLOOD GAS, ED - Abnormal; Notable for the following components:   pH, Arterial 7.323 (*)    pCO2 arterial 55.6 (*)    pO2, Arterial 143 (*)    Bicarbonate 29.0 (*)    Sodium 146 (*)    HCT 36.0 (*)    Hemoglobin 12.2 (*)    All other components within normal limits  TROPONIN I (HIGH SENSITIVITY) - Abnormal; Notable for the following components:   Troponin I (High Sensitivity) 44 (*)    All other components within normal limits  CULTURE, BLOOD (ROUTINE X 2)  CULTURE, BLOOD (ROUTINE X 2)  RESP PANEL BY RT-PCR (FLU A&B, COVID) ARPGX2  BLOOD GAS, ARTERIAL  LACTIC ACID, PLASMA  I-STAT CHEM 8, ED  TYPE AND SCREEN  TROPONIN I (HIGH SENSITIVITY)    EKG None  Radiology No results found.  Procedures Procedures  CRITICAL CARE Performed by: Valarie Merino   Total critical care time: 45 minutes  Critical care time was exclusive of separately billable procedures and treating other patients.  Critical care was necessary to treat or prevent imminent or life-threatening  deterioration.  Critical care was time spent personally by me on the following activities: development of treatment plan with patient and/or surrogate as well as nursing, discussions with consultants, evaluation of patient's response to treatment, examination of patient, obtaining history from patient or surrogate, ordering and performing treatments and interventions, ordering and review of laboratory studies, ordering and review of radiographic studies, pulse oximetry and re-evaluation of patient's condition.   Medications Ordered in ED Medications  vancomycin (VANCOREADY) IVPB 1500 mg/300 mL (has no administration in time range)  ceFEPIme (MAXIPIME) 2 g in sodium chloride 0.9 % 100 mL IVPB (has no administration in time range)    ED Course  I have reviewed the triage vital signs and the nursing notes.  Pertinent labs & imaging results that were available during my care of the patient were reviewed by me and considered in my medical decision making (see chart for details).    MDM Rules/Calculators/A&P                          MDM  MSE complete  Robert Mcconnell was evaluated in Emergency Department on 11/03/2020 for the symptoms described in the history of present illness. He was evaluated in the context of the global COVID-19 pandemic, which necessitated consideration that the patient might be at risk for infection with the SARS-CoV-2 virus that causes COVID-19. Institutional protocols and algorithms that pertain to the evaluation of patients at risk for COVID-19 are in a state of rapid change based on information released by regulatory bodies including the CDC and federal and state organizations. These policies and algorithms were followed during the patient's care in the ED.   Patient with new hypoxia and reported temperatures at home.  Patient's work-up is concerning for likely pneumonia.  With services aware case and will  evaluate for admission.   Final Clinical Impression(s) / ED  Diagnoses Final diagnoses:  Hypoxia  Community acquired pneumonia of right lung, unspecified part of lung    Rx / DC Orders ED Discharge Orders    None       Valarie Merino, MD 11/04/2020 1507

## 2020-10-06 NOTE — Progress Notes (Signed)
Lower extremity venous has been completed.   Preliminary results in CV Proc.   Abram Sander 10/29/2020 1:39 PM

## 2020-10-06 NOTE — Progress Notes (Signed)
HOSPITAL MEDICINE OVERNIGHT EVENT NOTE    Notified by nursing that he had to suction a good amount of food out of the patient's mouth and airway as the patient does not seem to be able to swallow food properly.  Chart reviewed, concern for aspiration and aspiration pneumonia.  Will make patient n.p.o. for now.  Evening medications reviewed including Eliquis, terazosin and Lipitor.  These medications can be skipped for tonight.  SLP evaluation ordered for the morning.  Vernelle Emerald  MD [facility]

## 2020-10-06 NOTE — ED Triage Notes (Signed)
Patient BIB [facility] for respiratory distress from PCP's office. Patient was taken to PCP office for wet-sounding respirations, PCP found patient room air SpO2 79% and sent to [facility]. Patient unable to tolerate CPAP enroute, arrives to ED on nonrebreather with SpO2 100%. Patient placed on venturi mask at 30% oxygen, maintaining SpO2 30%.

## 2020-10-06 NOTE — Progress Notes (Signed)
Pt daughter daughter insist of giving oral meds for the night before going home. Upon oral assessment, pt had some residual undigested food in the upper airway. Performed oral care and suction out thick food substances. Pt comfortable in bed with normal VS but not responding or alert. Paged Dr Cyd Silence MD via Shea Evans. Advised to hold all oral medication until SLP consult.

## 2020-10-06 NOTE — H&P (Addendum)
History and Physical    Robert Mcconnell [id number] DOB: 1921/05/19 DOA: 10/31/2020  Referring MD/NP/PA: Dene Gentry, MD PCP: [name], MD  Patient coming from: Urgent care via EMS  Chief Complaint: Shortness of breath  I have personally briefly reviewed patient's old medical records in [place]   HPI: Robert Mcconnell is a 87 y.o. male with medical history significant of hypertension, atrial fibrillation, CVA with residual right-sided hemiparesis, CKD stage III, and BPH presents with complaints of shortness of breath.  History is obtained from patient's daughter as patient is unable to provide his own history at this time due to his current condition.  He had been noted to have a change in his breathing yesterday and his daughter noted that he had a temperature up to 99.1 F at home.  Patient was noted to be more lethargic and wet sounding respirations and cough.  It is not unusual for him to have a intermittent cough from time to time.  To her knowledge he had not been getting choked up while eating and was on a regular diet.  He had been on thickened liquids prior after the stroke, but had been on a regular diet for approximately 16 months now.  Patient also an intermittent getting UTIs, but had not had one since being started on d-mannose.  His daughter had taken him to urgent care this morning as he seemed more lethargic and his breathing had worsened overnight.  At the urgent care patient was noted to have O2 saturations 79% on room air.  He had initially been placed on CPAP in route but was unable to tolerate this and was switched over to a nonrebreather mask.  In discussions with the patient's daughter at bedside she states that the patient has remained DNR and DNI.  She does not want aggressive measures such as any kind of surgery or invasive procedures.  She is okay with the patient receiving IV fluids, antibiotics, echocardiogram, and other medications as needed.  She  states she is okay with the patient receiving a speech therapy evaluation, but does not really want him to be placed back on thickened liquids.  Daughter acknowledges that patient is high risk of aspiration, but would want him to continue getting his medications in applesauce and being allowed to eat if he so chooses.  At home the patient had been doing well up until here recently participating in physical therapy.  He is under the [place] care and she declines any palliative care consult as he will no longer be able to receive those services.   ED Course: Upon admission into the emergency department patient was seen to be afebrile, pulse 73-1 15, respirations 2129, blood pressures 143/93-153/66, and O2 saturations currently maintained on 30% Venturi mask.  Labs labs significant for WBC 3.6, platelets 86, BUN 32, creatinine 1.76, glucose 153, BNP 986.1, troponin 44, and lactic acid 2.  Chest x-ray significant for right-sided pneumonia of the mid and lower lung with likely right-sided pleural effusion.  Patient had initially been given empiric antibiotics of vancomycin and cefepime.  Review of Systems  Unable to perform ROS: Acuity of condition  Respiratory: Positive for cough and shortness of breath.   Cardiovascular: Positive for leg swelling.    Past Medical History:  Diagnosis Date  . Atrial fibrillation ([place])   . BPH (benign prostatic hyperplasia)   . CKD (chronic kidney disease)   . History of coronary angioplasty with insertion of stent   . Hypertension   .  Severe sepsis ([place]) 11/04/2019  . Stroke [facility])     Past Surgical History:  Procedure Laterality Date  . COLECTOMY  2006   cancer     reports that he has never smoked. He has never used smokeless tobacco. He reports current alcohol use. He reports that he does not use drugs.  No Known Allergies  No pertinent family history  Prior to Admission medications   Medication Sig Start Date End Date Taking? Authorizing Provider  apixaban  (ELIQUIS) 2.5 MG TABS tablet Take 2.5 mg by mouth 2 (two) times daily.     [provider]  atorvastatin (LIPITOR) 40 MG tablet Take 40 mg by mouth at bedtime.     [provider]  calcitRIOL (ROCALTROL) 1 MCG/ML solution Take 0.25 mcg by mouth every Monday, Wednesday, and Friday.     [provider]  Docusate Sodium 150 MG/15ML syrup Take 150 mg by mouth daily.     [provider]  famotidine (PEPCID) 20 MG tablet Take 20 mg by mouth daily.     [provider]  finasteride (PROSCAR) 5 MG tablet Take 5 mg by mouth daily.    [provider]  levothyroxine (SYNTHROID) 25 MCG tablet Take 25 mcg by mouth daily before breakfast.    [provider]  melatonin 3 MG TABS tablet Take 9 mg by mouth at bedtime.    [provider]  metoprolol succinate (TOPROL-XL) 25 MG 24 hr tablet Take 25 mg by mouth daily.    [provider]  mirtazapine (REMERON) 15 MG tablet Take 15 mg by mouth at bedtime.    [provider]  senna (SENOKOT) 8.6 MG TABS tablet Take 1 tablet by mouth daily.    [provider]  terazosin (HYTRIN) 5 MG capsule Take 5 mg by mouth 2 (two) times daily.     [provider]  Zinc Sulfate (ZINC 15 PO) Take 15 mg by mouth 2 (two) times daily.    [provider]    Physical Exam:  Constitutional: Elderly male who appears ill, but able to awaken  by verbal stimuli Vitals:   11/04/2020 1308 10/11/2020 1315 10/22/2020 1415  BP: (!) 148/78 (!) 153/66 (!) 143/93  Pulse: 76 73 (!) 115  Resp: (!) 29 (!) 21 (!) 27  Temp: 97.8 F (36.6 C)    TempSrc: Oral    SpO2: 93% 94% 96%   Eyes: PERRL, lids and conjunctivae normal ENMT: Mucous membranes are moist.  Neck: Normal in appearance with no significant JVD appreciated. Respiratory: Tachypneic with rhonchi appreciated most notably on the right lung field.  Patient currently on Venturi facemask Cardiovascular: Regular rate and rhythm, no  murmurs / rubs / gallops. No extremity edema. 2+ pedal pulses. No carotid bruits.  Abdomen: no tenderness, no masses palpated. No hepatosplenomegaly. Bowel sounds positive.  Musculoskeletal: no clubbing / cyanosis.  Muscle wasting noted of the bilateral lower extremities Skin: no rashes, lesions, ulcers. No induration Neurologic: CN 2-12 grossly intact.  Right-sided hemiparesis Psychiatric: Lethargic unable to assess orientation at this time.    Labs on Admission: I have personally reviewed following labs and imaging studies  CBC: Recent Labs  Lab 10/31/2020 1250 10/08/2020 1255  WBC  --  3.6*  NEUTROABS  --  3.0  HGB 12.2* 13.0  HCT 36.0* 44.0  MCV  --  101.4*  PLT  --  86*   Basic Metabolic Panel: Recent Labs  Lab 10/16/2020 1250 11/04/2020 1255  NA 146*  143  K 3.6 3.8  CL  --  109  CO2  --  29  GLUCOSE  --  153*  BUN  --  32*  CREATININE  --  1.76*  CALCIUM  --  8.4*   GFR: CrCl cannot be calculated (Unknown ideal weight.). Liver Function Tests: Recent Labs  Lab 10/26/2020 1255  AST 22  ALT 13  ALKPHOS 52  BILITOT 0.7  PROT 6.6  ALBUMIN 2.7*   No results for input(s): LIPASE, AMYLASE in the last 168 hours. No results for input(s): AMMONIA in the last 168 hours. Coagulation Profile: Recent Labs  Lab 10/07/2020 1255  INR 1.3*   Cardiac Enzymes: No results for input(s): CKTOTAL, CKMB, CKMBINDEX, TROPONINI in the last 168 hours. BNP (last 3 results) No results for input(s): PROBNP in the last 8760 hours. HbA1C: No results for input(s): HGBA1C in the last 72 hours. CBG: No results for input(s): GLUCAP in the last 168 hours. Lipid Profile: No results for input(s): CHOL, HDL, LDLCALC, TRIG, CHOLHDL, LDLDIRECT in the last 72 hours. Thyroid Function Tests: No results for input(s): TSH, T4TOTAL, FREET4, T3FREE, THYROIDAB in the last 72 hours. Anemia Panel: No results for input(s): VITAMINB12, FOLATE, FERRITIN, TIBC, IRON, RETICCTPCT in the last 72 hours. Urine  analysis:    Component Value Date/Time   COLORURINE YELLOW 11/03/2019 0041   APPEARANCEUR TURBID (A) 11/03/2019 0041   LABSPEC 1.025 11/03/2019 0041   PHURINE 6.0 11/03/2019 0041   GLUCOSEU NEGATIVE 11/03/2019 0041   HGBUR SMALL (A) 11/03/2019 0041   BILIRUBINUR NEGATIVE 11/03/2019 0041   KETONESUR NEGATIVE 11/03/2019 0041   PROTEINUR 30 (A) 11/03/2019 0041   NITRITE NEGATIVE 11/03/2019 0041   LEUKOCYTESUR MODERATE (A) 11/03/2019 0041   Sepsis Labs: No results found for this or any previous visit (from the past 240 hour(s)).   Radiological Exams on Admission: DG Chest Port 1 View  Result Date: 10/21/2020 CLINICAL DATA:  Dyspnea.  Decreased oxygen saturation. EXAM: PORTABLE CHEST 1 VIEW COMPARISON:  Single-view of the chest 11/02/2019. FINDINGS: There is extensive airspace disease in the right mid and lower lung zones. Left lung appears clear. Cardiomegaly. No pneumothorax. There is likely a small to moderate right pleural effusion. No left effusion. Aortic atherosclerosis. IMPRESSION: Airspace disease in the right mid and lower lung zones could be due to pneumonia or aspiration. There is likely a right pleural effusion. Cardiomegaly. Aortic Atherosclerosis (ICD10-I70.0). Electronically Signed   By: Inge Rise M.D.   On: 10/29/2020 13:22   VAS [place] LOWER EXTREMITY VENOUS (DVT) (ONLY [facility] & [facility] 7a-7p)  Result Date: 10/26/2020  Lower Venous DVT Study Patient Name:  JEMELL SCHANTZ  Date of Exam:   10/15/2020 Medical Rec #: [id number]       Accession #:    [id number] Date of Birth: [birth year]       Patient Gender: M Patient Age:   89Y Exam Location:  [facility] Procedure:      VAS [place] LOWER EXTREMITY VENOUS (DVT) Referring Phys: [id number] [place] --------------------------------------------------------------------------------  Indications: Edema.  Comparison Study: no prior Performing Technologist: Archie Patten RVS  Examination Guidelines: A complete evaluation includes B-mode  imaging, spectral Doppler, color Doppler, and power Doppler as needed of all accessible portions of each vessel. Bilateral testing is considered an integral part of a complete examination. Limited examinations for reoccurring indications may be performed as noted. The reflux portion of the exam is performed with the patient in reverse Trendelenburg.  +---------+---------------+---------+-----------+----------+-------------------+ RIGHT  CompressibilityPhasicitySpontaneityPropertiesThrombus Aging      +---------+---------------+---------+-----------+----------+-------------------+ CFV      Full           Yes      Yes                                      +---------+---------------+---------+-----------+----------+-------------------+ SFJ      Full                                                             +---------+---------------+---------+-----------+----------+-------------------+ FV Prox  Full                                                             +---------+---------------+---------+-----------+----------+-------------------+ FV Mid   Full                                                             +---------+---------------+---------+-----------+----------+-------------------+ FV DistalFull                                                             +---------+---------------+---------+-----------+----------+-------------------+ PFV      Full                                                             +---------+---------------+---------+-----------+----------+-------------------+ POP      Full           Yes      Yes                                      +---------+---------------+---------+-----------+----------+-------------------+ PTV      Full                                                             +---------+---------------+---------+-----------+----------+-------------------+ PERO                                                   Not well visualized +---------+---------------+---------+-----------+----------+-------------------+   +---------+---------------+---------+-----------+----------+-------------------+ LEFT     CompressibilityPhasicitySpontaneityPropertiesThrombus Aging      +---------+---------------+---------+-----------+----------+-------------------+  CFV      Full           Yes      Yes                                      +---------+---------------+---------+-----------+----------+-------------------+ SFJ      Full                                                             +---------+---------------+---------+-----------+----------+-------------------+ FV Prox  Full                                                             +---------+---------------+---------+-----------+----------+-------------------+ FV Mid   Full                                                             +---------+---------------+---------+-----------+----------+-------------------+ FV DistalFull                                                             +---------+---------------+---------+-----------+----------+-------------------+ PFV      Full                                                             +---------+---------------+---------+-----------+----------+-------------------+ POP      Full           Yes      Yes                                      +---------+---------------+---------+-----------+----------+-------------------+ PTV      Full                                                             +---------+---------------+---------+-----------+----------+-------------------+ PERO                                                  Not well visualized +---------+---------------+---------+-----------+----------+-------------------+     Summary: BILATERAL: - No evidence of deep vein thrombosis seen in the lower extremities, bilaterally. -No evidence of popliteal cyst,  bilaterally.   *See table(s) above for measurements and observations.    Preliminary     EKG: Independently reviewed.  Atrial fibrillation at 75 bpm  Assessment/Plan Acute respiratory failure with hypoxia and hypercapnia  Sepsis secondary to pneumonia: Patient was initially noted to be tachycardic and tachypneic with O2 saturations reported to be 79% on room air.  White blood cell count 3.6 and initial lactic acid 2.  Chest x-ray significant for right sided pneumonia with right-sided pleural effusion.  Patient had initially been given empiric antibiotics of vancomycin and cefepime.  Arterial blood gas significant for pH 7.323, PCO2 55.6, PO2 143.  Respiratory acidosis secondary to hypercapnia appreciated, but patient is not a good candidate for BiPAP due to aspiration risk, and did not tolerate CPAP in transport. -Admit to a progressive bed  -Continuous pulse oximetry with oxygen to maintain O2 saturation greater than 92% -Follow-up blood cultures -Add on procalcitonin -Aspiration precautions with elevation of the head of the bed -Pulmonary toiletry every 4 hours -Suctioning as needed -Antibiotics to Rocephin and azithromycin -Speech therapy to evaluate and treat in a.m. -Repeat CBC in a.m. -Trend lactic acid  Systolic congestive heart failure exacerbation: Acute on chronic.  Patient has with +1 pitting edema in the lower extremities and wet sounding lungs.  BNP elevated at 982.  Last EF was noted to be around 30-35% in 2021. -I&O's and daily weights  -Lasix 20 mg IV x1  -Check echocardiogram -Reassess fluid status in a.m. and determine if further IV diuresis is warranted.    Chronic atrial fibrillation on anticoagulation: Patient appears currently rate controlled and has been compliant with anticoagulation. -Continue metoprolol and Eliquis  History of CVA with residual deficit dysphagia: Patient with prior history of stroke with residual right-sided hemiparesis.  Daughter reports that  patient had been able to be on a regular diet for the last 16 months and had no issues with getting choked up with food. -PT/OT/speech to evaluate and treat  CKD stage IV: Creatinine 1.76 which appears near patient's baseline. -Continue to monitor  Hypothyroidism: Last TSH was 3.379 on 11/03/2019\ -Check TSH -Continue levothyroxine  Thrombocytopenia: Chronic.  Platelet count 86 which appears near patient's baseline -Continue to monitor  History of recurrent UTI -Check urinalysis  BPH -Continue terazosin and Proscar  Hyperlipidemia -Continue atorvastatin  DNR: Present on admission.  DVT prophylaxis: Eliquis Code Status: DNR Family Communication: Daughter updated at bedside Disposition Plan: To be determined Consults called: PT/OT/speech Admission status: Inpatient, require more than 2 midnight stay  Norval Morton MD [facility]   If 7PM-7AM, please contact night-coverage   10/15/2020, 3:40 PM

## 2020-10-06 NOTE — Progress Notes (Signed)
Pharmacy Antibiotic Note  Robert Mcconnell is a 87 y.o. male admitted on 11/03/2020 with sepsis.  Pharmacy has been consulted for vancomycin and cefepime dosing.  Plan: Vancomycin '1500mg'$  x1 then '1250mg'$  IV q48h (estimated AUC of 479) -monitor clinical status, renal function, and vanc levels as needed  Cefepime 2g IV q24h  Temp (24hrs), Avg:97.8 F (36.6 C), Min:97.8 F (36.6 C), Max:97.8 F (36.6 C)  Recent Labs  Lab 10/24/2020 1255  WBC 3.6*  CREATININE 1.76*  LATICACIDVEN 2.0*    CrCl cannot be calculated (Unknown ideal weight.).    No Known Allergies  Antimicrobials this admission: Cefepime 6/1 >>   Vanc 6/1 >>  Dose adjustments this admission: n/a  Microbiology results: 6/1 BCx: pending 6/1 resp panel: pending  Thank you for allowing pharmacy to be a part of this patient's care.  Joetta Manners, PharmD, [facility] Clinical Pharmacist  Please check AMION for all [place] phone numbers After 10:00 PM, call [place] [phone]

## 2020-10-07 ENCOUNTER — Inpatient Hospital Stay (HOSPITAL_COMMUNITY): Payer: No Typology Code available for payment source

## 2020-10-07 DIAGNOSIS — I5023 Acute on chronic systolic (congestive) heart failure: Secondary | ICD-10-CM

## 2020-10-07 DIAGNOSIS — N184 Chronic kidney disease, stage 4 (severe): Secondary | ICD-10-CM

## 2020-10-07 LAB — BASIC METABOLIC PANEL
Anion gap: 9 (ref 5–15)
BUN: 33 mg/dL — ABNORMAL HIGH (ref 8–23)
CO2: 28 mmol/L (ref 22–32)
Calcium: 8 mg/dL — ABNORMAL LOW (ref 8.9–10.3)
Chloride: 107 mmol/L (ref 98–111)
Creatinine, Ser: 1.65 mg/dL — ABNORMAL HIGH (ref 0.61–1.24)
GFR, Estimated: 38 mL/min — ABNORMAL LOW (ref >60)
Glucose, Bld: 127 mg/dL — ABNORMAL HIGH (ref 70–99)
Potassium: 3.5 mmol/L (ref 3.5–5.1)
Sodium: 144 mmol/L (ref 135–145)

## 2020-10-07 LAB — CBC WITH DIFFERENTIAL/PLATELET
Abs Immature Granulocytes: 0.01 K/uL (ref 0.00–0.07)
Basophils Absolute: 0 K/uL (ref 0.0–0.1)
Basophils Relative: 0 %
Eosinophils Absolute: 0 K/uL (ref 0.0–0.5)
Eosinophils Relative: 0 %
HCT: 38.8 % — ABNORMAL LOW (ref 39.0–52.0)
Hemoglobin: 11.4 g/dL — ABNORMAL LOW (ref 13.0–17.0)
Immature Granulocytes: 0 %
Lymphocytes Relative: 15 %
Lymphs Abs: 0.5 K/uL — ABNORMAL LOW (ref 0.7–4.0)
MCH: 29.8 pg (ref 26.0–34.0)
MCHC: 29.4 g/dL — ABNORMAL LOW (ref 30.0–36.0)
MCV: 101.3 fL — ABNORMAL HIGH (ref 80.0–100.0)
Monocytes Absolute: 0.2 K/uL (ref 0.1–1.0)
Monocytes Relative: 6 %
Neutro Abs: 2.9 K/uL (ref 1.7–7.7)
Neutrophils Relative %: 79 %
Platelets: 80 K/uL — ABNORMAL LOW (ref 150–400)
RBC: 3.83 MIL/uL — ABNORMAL LOW (ref 4.22–5.81)
RDW: 16.8 % — ABNORMAL HIGH (ref 11.5–15.5)
WBC: 3.6 K/uL — ABNORMAL LOW (ref 4.0–10.5)
nRBC: 0 % (ref 0.0–0.2)

## 2020-10-07 LAB — ECHOCARDIOGRAM COMPLETE
AR max vel: 1.94 cm2
AV Area VTI: 1.73 cm2
AV Area mean vel: 1.89 cm2
AV Mean grad: 7 mmHg
AV Peak grad: 12.8 mmHg
Ao pk vel: 1.79 m/s
Area-P 1/2: 3.95 cm2
Height: 67 in
S' Lateral: 3.8 cm
Weight: 2497.37 oz

## 2020-10-07 LAB — MAGNESIUM: Magnesium: 2.1 mg/dL (ref 1.7–2.4)

## 2020-10-07 LAB — ABO/RH: ABO/RH(D): O POS

## 2020-10-07 NOTE — Evaluation (Signed)
Physical Therapy Evaluation Patient Details Name: Robert Mcconnell MRN: [id number] DOB: June 21, 1921 Today's Date: 10/07/2020   History of Present Illness  87 y.o. male presenting to ED on 6/1 with SOB, temp 99.1, cough and lethargy. Patient admitted with acute respiratory failure with hypoxia and hypercapnia and sepsis secondary to pneumonia. Concer for aspiration pneumonia. PMHx significant for HTN, A-fib, CVA with residual R-sided weakness, CKD III, and BPH.  Clinical Impression  Pt presents to PT with decr mobility and appears very frail. Pt has been at home with family and excellent hired caregiver. Pt has been able to transfer wheelchair or to seat of rollator with assist of caregiver. Currently requiring more assistance. Pt does have hoyer lift at home if needed. Will initiate PT to attempt to assist pt back toward his baseline. Given his frailty feel Palliative Care consult would be helpful.     Follow Up Recommendations Home health PT;Other (comment) (vs palliative services)    Equipment Recommendations  None recommended by PT    Recommendations for Other Services       Precautions / Restrictions Precautions Precautions: Fall      Mobility  Bed Mobility Overal bed mobility: Needs Assistance Bed Mobility: Supine to Sit;Sit to Sidelying     Supine to sit: +2 for physical assistance;Total assist   Sit to sidelying: +2 for physical assistance;Max assist General bed mobility comments: Assist with all aspects coming to sit with pt pushing into extension.    Transfers                 General transfer comment: did no attempt  Ambulation/Gait                Stairs            Wheelchair Mobility    Modified Rankin (Stroke Patients Only)       Balance Overall balance assessment: Needs assistance Sitting-balance support: Single extremity supported;Feet supported Sitting balance-Leahy Scale: Poor Sitting balance - Comments: Pt sat EOB x 8 minutes  initially with max assist and progressed to min guard Postural control: Right lateral lean;Posterior lean                                   Pertinent Vitals/Pain Pain Assessment: Faces Faces Pain Scale: No hurt    Home Living Family/patient expects to be discharged to:: Private residence Living Arrangements: Spouse/significant other Available Help at Discharge: Family;Personal care attendant;Available 24 hours/day (PCA 9am-4:30pm M-F. Daughter present at night) Type of Home: House Home Access: Ramped entrance     Home Layout: Two level Home Equipment: Wheelchair - power;Hospital bed;Other (comment) (hoyer, power wheelchair, chair lift) Additional Comments: Reports PCA and family transfer him without hoyer.    Prior Function Level of Independence: Needs assistance   Gait / Transfers Assistance Needed: Per OPPT note 5/25, patient completing lateral scoot transfers to L with Min to Mod A and Max multimodal cues. Per PCA she performs squate pivot transfer with mod to max assist  ADL's / Homemaking Assistance Needed: Total A for ADLs grossly. WIfe reports patient occasional requires assist for self-feeding.  Comments: Active with OPPT 1x weekly     Hand Dominance   Dominant Hand: Right    Extremity/Trunk Assessment   Upper Extremity Assessment RUE Deficits / Details: No spontaneous movement noted. Hand resting with digits and wrist in flexion. Able to be stretched passively to full extension. No facial grimacing  noted. 2 finger subluxation at shoulder joint. RUE Coordination: decreased fine motor;decreased gross motor LUE Deficits / Details: Patient with tremor noted. Wife reports this is new. PROM WFL. Patient does not maintain position of LUE in space against gravity.    Lower Extremity Assessment Lower Extremity Assessment: RLE deficits/detail;LLE deficits/detail RLE Deficits / Details: no active movement noted LLE Deficits / Details: Strength <3/5        Communication   Communication: Receptive difficulties;Expressive difficulties;Other (comment) (No attempts to verbalize this date. Trying to use hand signals)  Cognition Arousal/Alertness: Awake/alert Behavior During Therapy: Flat affect Overall Cognitive Status: Difficult to assess                                 General Comments: Pt following 1 step commands inconsistently with incr time      General Comments General comments (skin integrity, edema, etc.): VSS with pt on ventimask. Pt's caregiver present    Exercises     Assessment/Plan    PT Assessment Patient needs continued PT services  PT Problem List Decreased strength;Decreased activity tolerance;Decreased balance;Decreased mobility;Decreased cognition;Cardiopulmonary status limiting activity       PT Treatment Interventions DME instruction;Functional mobility training;Therapeutic activities;Therapeutic exercise;Balance training;Cognitive remediation;Patient/family education    PT Goals (Current goals can be found in the Care Plan section)  Acute Rehab PT Goals Patient Stated Goal: Per wife; for patient to return home. PT Goal Formulation: Patient unable to participate in goal setting Time For Goal Achievement: 10/21/20 Potential to Achieve Goals: Fair    Frequency Min 3X/week   Barriers to discharge        Co-evaluation               AM-PAC PT "6 Clicks" Mobility  Outcome Measure Help needed turning from your back to your side while in a flat bed without using bedrails?: Total Help needed moving from lying on your back to sitting on the side of a flat bed without using bedrails?: Total Help needed moving to and from a bed to a chair (including a wheelchair)?: Total Help needed standing up from a chair using your arms (e.g., wheelchair or bedside chair)?: Total Help needed to walk in hospital room?: Total Help needed climbing 3-5 steps with a railing? : Total 6 Click Score: 6    End of  Session Equipment Utilized During Treatment: Oxygen Activity Tolerance: Patient limited by fatigue Patient left: in bed;with call bell/phone within reach;with bed alarm set;with family/visitor present   PT Visit Diagnosis: Other abnormalities of gait and mobility (R26.89);Muscle weakness (generalized) (M62.81);Adult, failure to thrive (R62.7);Hemiplegia and hemiparesis Hemiplegia - Right/Left: Right Hemiplegia - dominant/non-dominant: Dominant Hemiplegia - caused by: Cerebral infarction    Time: [id number] PT Time Calculation (min) (ACUTE ONLY): 16 min   Charges:   PT Evaluation $PT Eval Moderate Complexity: Ayden Pager [phone] Office [place] 10/07/2020, 5:20 PM

## 2020-10-07 NOTE — Progress Notes (Signed)
PROGRESS NOTE    Robert Mcconnell  [id number] DOB: 03/28/1922 DOA: 10/29/2020 PCP: [name], MD    Brief Narrative:  87 y.o. male with medical history significant of hypertension, atrial fibrillation, CVA with residual right-sided hemiparesis, CKD stage III, and BPH presents with complaints of shortness of breath. Noted to be more lethargic recently. Pt was recommended to be on a thickened liquid diet post-prior CVA , however had been on regular diet over the past 16 mos. Family is reportedly already aware of high risk for aspiration prior to visit.  Assessment & Plan:   Principal Problem:   Acute respiratory failure with hypoxia and hypercapnia (HCC) Active Problems:   CKD (chronic kidney disease) stage 4, GFR 15-29 ml/min (HCC)   Thrombocytopenia (HCC)   Atrial fibrillation, chronic (HCC)   Dysphagia   History of CVA with residual deficit   Aspiration pneumonia (HCC)   Hypothyroidism   Acute on chronic systolic CHF (congestive heart failure) (HCC)  Acute respiratory failure with hypoxia and hypercapnia  Sepsis secondary to pneumonia:  -Presented with tachycardia, tachypnea with O2 sats 79% on RA initially -Overnight, observed to aspirate on trial of PO intake -Remained on venturimask this AM -CXR reviewed with findings suggestive of airspace disease in the R mid-lower lung zones due to aspiration -Seen by SLP, recs for NPO -Will need to discuss alternative means of nutrition. Will consult Palliative Care to assess goals of care -Likely poor long term prognosis -for now, continue azithromycin and rocephin -Wean O2 as tolerated  Systolic congestive heart failure exacerbation:  -noted to have +1 pitting edema in the lower extremities and wet sounding lungs.  BNP elevated at 982.  Last EF was noted to be around 30-35% in 2021. -I&O's and daily weights  -Was given Lasix 20 mg IV x1  -2d echo reviewed. EF of 60-65% -Will recheck bmet in AM    Chronic atrial  fibrillation on anticoagulation: Patient appears currently rate controlled and has been compliant with anticoagulation. -Continued on metoprolol and Eliquis as tolerated History of CVA with residual deficit dysphagia: Patient with prior history of stroke with residual right-sided hemiparesis.   -Pt noted to be on a regular diet for the last 16 months and had no reported issues with diet, per report, pt was recommended thickened liquid diet following CVA -Seen by PT/OT, recs for Palliative Care referral  CKD stage IV: Creatinine 1.76 which appears near patient's baseline. -recheck bmet in AM  Hypothyroidism: Last TSH was 3.379 on 11/03/2019\ -TSH 3.168 -Continue levothyroxine  Thrombocytopenia: Chronic.  Platelet count 86 which appears near patient's baseline -Continue to monitor  History of recurrent UTI -UA pending  BPH -Was continued on terazosin and Proscar  Hyperlipidemia -was continued on atorvastatin  DNR: Present on admission.  DVT prophylaxis: Eliquis Code Status: DNR Family Communication: Pt in room, family over phone  Status is: Inpatient  Remains inpatient appropriate because:Inpatient level of care appropriate due to severity of illness   Dispo: The patient is from: Home              Anticipated d/c is to: Unknown at this time              Patient currently is not medically stable to d/c.   Difficult to place patient No       Consultants:     Procedures:     Antimicrobials: Anti-infectives (From admission, onward)   Start     Dose/Rate Route Frequency Ordered Stop   10/08/20  1600  vancomycin (VANCOREADY) IVPB 1250 mg/250 mL  Status:  Discontinued        1,250 mg 166.7 mL/hr over 90 Minutes Intravenous Every 48 hours 10/07/2020 1456 11/01/2020 1615   10/07/20 1400  ceFEPIme (MAXIPIME) 2 g in sodium chloride 0.9 % 100 mL IVPB  Status:  Discontinued        2 g 200 mL/hr over 30 Minutes Intravenous Every 24 hours 10/31/2020 1451 11/03/2020 1615    10/07/20 1000  cefTRIAXone (ROCEPHIN) 2 g in sodium chloride 0.9 % 100 mL IVPB        2 g 200 mL/hr over 30 Minutes Intravenous Every 24 hours 10/25/2020 1615 10/12/20 0959   10/07/20 1000  azithromycin (ZITHROMAX) 500 mg in sodium chloride 0.9 % 250 mL IVPB        500 mg 250 mL/hr over 60 Minutes Intravenous Every 24 hours 10/17/2020 1615 10/11/20 0959   10/25/2020 1300  vancomycin (VANCOREADY) IVPB 1500 mg/300 mL        1,500 mg 150 mL/hr over 120 Minutes Intravenous  Once 10/25/2020 1253 10/26/2020 1658   10/08/2020 1300  ceFEPIme (MAXIPIME) 2 g in sodium chloride 0.9 % 100 mL IVPB        2 g 200 mL/hr over 30 Minutes Intravenous  Once 10/22/2020 1253 11/02/2020 1448       Subjective: Unable to assess given mentation  Objective: Vitals:   10/07/20 0828 10/07/20 0900 10/07/20 1225 10/07/20 1600  BP:   (!) 119/55 (!) 131/96  Pulse:   65 66  Resp:   18 17  Temp:   97.7 F (36.5 C) 98 F (36.7 C)  TempSrc:   Axillary Axillary  SpO2:   100% 100%  Weight: 70.8 kg     Height:  '5\' 7"'$  (1.702 m)      Intake/Output Summary (Last 24 hours) at 10/07/2020 1808 Last data filed at 10/07/2020 0458 Gross per 24 hour  Intake --  Output 600 ml  Net -600 ml   Filed Weights   10/07/20 0828  Weight: 70.8 kg    Examination: General exam: Awake, laying in bed, appears sob Respiratory system: increased respiratory effort, no wheezing Cardiovascular system: regular rate, s1, s2 Gastrointestinal system: Soft, nondistended, positive BS Central nervous system: CN2-12 grossly intact, strength intact Extremities: Perfused, no clubbing Skin: Normal skin turgor, no notable skin lesions seen Psychiatry: unable to assess given mentation  Data Reviewed: I have personally reviewed following labs and imaging studies  CBC: Recent Labs  Lab 10/15/2020 1250 10/28/2020 1255 10/07/20 0211  WBC  --  3.6* 3.6*  NEUTROABS  --  3.0 2.9  HGB 12.2* 13.0 11.4*  HCT 36.0* 44.0 38.8*  MCV  --  101.4* 101.3*  PLT  --  86*  80*   Basic Metabolic Panel: Recent Labs  Lab 11/04/2020 1250 10/12/2020 1255 10/07/20 0211  NA 146* 143 144  K 3.6 3.8 3.5  CL  --  109 107  CO2  --  29 28  GLUCOSE  --  153* 127*  BUN  --  32* 33*  CREATININE  --  1.76* 1.65*  CALCIUM  --  8.4* 8.0*  MG  --   --  2.1   GFR: Estimated Creatinine Clearance: 24.5 mL/min (A) (by C-G formula based on SCr of 1.65 mg/dL (H)). Liver Function Tests: Recent Labs  Lab 10/20/2020 1255  AST 22  ALT 13  ALKPHOS 52  BILITOT 0.7  PROT 6.6  ALBUMIN 2.7*  No results for input(s): LIPASE, AMYLASE in the last 168 hours. No results for input(s): AMMONIA in the last 168 hours. Coagulation Profile: Recent Labs  Lab 10/11/2020 1255  INR 1.3*   Cardiac Enzymes: No results for input(s): CKTOTAL, CKMB, CKMBINDEX, TROPONINI in the last 168 hours. BNP (last 3 results) No results for input(s): PROBNP in the last 8760 hours. HbA1C: No results for input(s): HGBA1C in the last 72 hours. CBG: No results for input(s): GLUCAP in the last 168 hours. Lipid Profile: No results for input(s): CHOL, HDL, LDLCALC, TRIG, CHOLHDL, LDLDIRECT in the last 72 hours. Thyroid Function Tests: Recent Labs    11/01/2020 1844  TSH 3.168   Anemia Panel: No results for input(s): VITAMINB12, FOLATE, FERRITIN, TIBC, IRON, RETICCTPCT in the last 72 hours. Sepsis Labs: Recent Labs  Lab 10/20/2020 1255 10/24/2020 1844  PROCALCITON  --  0.28  LATICACIDVEN 2.0* 1.1    Recent Results (from the past 240 hour(s))  Resp Panel by RT-PCR (Flu A&B, Covid) Nasopharyngeal Swab     Status: None   Collection Time: 10/08/2020 12:44 PM   Specimen: Nasopharyngeal Swab; Nasopharyngeal(NP) swabs in vial transport medium  Result Value Ref Range Status   SARS Coronavirus 2 by RT PCR NEGATIVE NEGATIVE Final    Comment: (NOTE) SARS-CoV-2 target nucleic acids are NOT DETECTED.  The SARS-CoV-2 RNA is generally detectable in upper respiratory specimens during the acute phase of infection.  The lowest concentration of SARS-CoV-2 viral copies this assay can detect is 138 copies/mL. A negative result does not preclude SARS-Cov-2 infection and should not be used as the sole basis for treatment or other patient management decisions. A negative result may occur with  improper specimen collection/handling, submission of specimen other than nasopharyngeal swab, presence of viral mutation(s) within the areas targeted by this assay, and inadequate number of viral copies(<138 copies/mL). A negative result must be combined with clinical observations, patient history, and epidemiological information. The expected result is Negative.  Fact Sheet for Patients:  EntrepreneurPulse.com.au  Fact Sheet for Healthcare Providers:  IncredibleEmployment.be  This test is no t yet approved or cleared by the [place] FDA and  has been authorized for detection and/or diagnosis of SARS-CoV-2 by FDA under an Emergency Use Authorization (EUA). This EUA will remain  in effect (meaning this test can be used) for the duration of the COVID-19 declaration under Section 564(b)(1) of the Act, 21 U.S.C.section 360bbb-3(b)(1), unless the authorization is terminated  or revoked sooner.       Influenza A by PCR NEGATIVE NEGATIVE Final   Influenza B by PCR NEGATIVE NEGATIVE Final    Comment: (NOTE) The Xpert Xpress SARS-CoV-2/FLU/RSV plus assay is intended as an aid in the diagnosis of influenza from Nasopharyngeal swab specimens and should not be used as a sole basis for treatment. Nasal washings and aspirates are unacceptable for Xpert Xpress SARS-CoV-2/FLU/RSV testing.  Fact Sheet for Patients: EntrepreneurPulse.com.au  Fact Sheet for Healthcare Providers: IncredibleEmployment.be  This test is not yet approved or cleared by the [place] FDA and has been authorized for detection and/or diagnosis of SARS-CoV-2 by FDA  under an Emergency Use Authorization (EUA). This EUA will remain in effect (meaning this test can be used) for the duration of the COVID-19 declaration under Section 564(b)(1) of the Act, 21 U.S.C. section 360bbb-3(b)(1), unless the authorization is terminated or revoked.  Performed at [facility], [place] [address], [place], [place] [address]   Culture, blood (routine x 2)  Status: None (Preliminary result)   Collection Time: 11/04/2020  2:13 PM   Specimen: BLOOD RIGHT FOREARM  Result Value Ref Range Status   Specimen Description BLOOD RIGHT FOREARM  Final   Special Requests   Final    BOTTLES DRAWN AEROBIC AND ANAEROBIC Blood Culture results may not be optimal due to an inadequate volume of blood received in culture bottles   Culture   Final    NO GROWTH < 24 HOURS Performed at [facility], [address], [place], [place] [address]    Report Status PENDING  Incomplete     Radiology Studies: DG Chest Port 1 View  Result Date: 10/17/2020 CLINICAL DATA:  Dyspnea.  Decreased oxygen saturation. EXAM: PORTABLE CHEST 1 VIEW COMPARISON:  Single-view of the chest 11/02/2019. FINDINGS: There is extensive airspace disease in the right mid and lower lung zones. Left lung appears clear. Cardiomegaly. No pneumothorax. There is likely a small to moderate right pleural effusion. No left effusion. Aortic atherosclerosis. IMPRESSION: Airspace disease in the right mid and lower lung zones could be due to pneumonia or aspiration. There is likely a right pleural effusion. Cardiomegaly. Aortic Atherosclerosis (ICD10-I70.0). Electronically Signed   By: Inge Rise M.D.   On: 11/03/2020 13:22   ECHOCARDIOGRAM COMPLETE  Result Date: 10/07/2020    ECHOCARDIOGRAM REPORT   Patient Name:   Robert Mcconnell Date of Exam: 10/07/2020 Medical Rec #:  [id number]      Height:       67.0 in Accession #:    [id number]     Weight:       156.1 lb Date of Birth:  January 31, 1922      BSA:          1.820 m Patient Age:     89 years       BP:           108/65 mmHg Patient Gender: M              HR:           63 bpm. Exam Location:  Inpatient Procedure: 2D Echo, Cardiac Doppler and Color Doppler Indications:    CHF  History:        Patient has prior history of Echocardiogram examinations, most                 recent 11/04/2019. CAD, Stroke, Arrythmias:Atrial Fibrillation;                 Risk Factors:Hypertension.  Sonographer:    Cammy Brochure Referring Phys: [id number] RONDELL A SMITH IMPRESSIONS  1. Left ventricular ejection fraction, by estimation, is 60 to 65%. The left ventricle has normal function. The left ventricle has no regional wall motion abnormalities. There is moderate concentric left ventricular hypertrophy and severe basal septal hypertrophy.  2. Right ventricular systolic function is normal. The right ventricular size is normal.  3. Left atrial size was severely dilated.  4. Right atrial size was severely dilated.  5. Tricuspid valve regurgitation is mild to moderate.  6. The mitral valve is degenerative. Mild mitral valve regurgitation. No evidence of mitral stenosis.  7. The aortic valve is calcified. There is moderate calcification of the aortic valve. There is moderate thickening of the aortic valve. Aortic valve regurgitation is mild. Mild aortic valve stenosis. Aortic valve area, by VTI measures 1.73 cm. Aortic valve mean gradient measures 7.0 mmHg. Aortic valve Vmax measures 1.79 m/s.  8. Aortic dilatation noted. Aneurysm of the  ascending aorta, measuring 47 mm. There is mild dilatation of the aortic root, measuring 40 mm.  9. The inferior vena cava is normal in size with greater than 50% respiratory variability, suggesting right atrial pressure of 3 mmHg. 10. There is a trivial pericardial effusion is posterior to the left ventricle. FINDINGS  Left Ventricle: Left ventricular ejection fraction, by estimation, is 45 to 50%. The left ventricle has mildly decreased function. The left ventricle demonstrates  global hypokinesis. The left ventricular internal cavity size was normal in size. There is  moderate concentric left ventricular hypertrophy. Left ventricular diastolic function could not be evaluated due to atrial fibrillation. Left ventricular diastolic function could not be evaluated. Elevated left ventricular end-diastolic pressure. Right Ventricle: The right ventricular size is normal. No increase in right ventricular wall thickness. Right ventricular systolic function is normal. Left Atrium: Left atrial size was severely dilated. Right Atrium: Right atrial size was severely dilated. Pericardium: Trivial pericardial effusion is present. The pericardial effusion is posterior to the left ventricle. Mitral Valve: The mitral valve is degenerative in appearance. There is mild thickening of the mitral valve leaflet(s). There is mild calcification of the mitral valve leaflet(s). Mild mitral annular calcification. Mild mitral valve regurgitation. No evidence of mitral valve stenosis. Tricuspid Valve: The tricuspid valve is normal in structure. Tricuspid valve regurgitation is mild to moderate. No evidence of tricuspid stenosis. Aortic Valve: The aortic valve is calcified. There is moderate calcification of the aortic valve. There is moderate thickening of the aortic valve. Aortic valve regurgitation is mild. Mild aortic stenosis is present. Aortic valve mean gradient measures 7.0 mmHg. Aortic valve peak gradient measures 12.8 mmHg. Aortic valve area, by VTI measures 1.73 cm. Pulmonic Valve: The pulmonic valve was normal in structure. Pulmonic valve regurgitation is trivial. No evidence of pulmonic stenosis. Aorta: Aortic dilatation noted. There is mild dilatation of the aortic root, measuring 40 mm. There is an aneurysm involving the ascending aorta measuring 47 mm. Venous: The inferior vena cava is normal in size with greater than 50% respiratory variability, suggesting right atrial pressure of 3 mmHg. IAS/Shunts: No  atrial level shunt detected by color flow Doppler.  LEFT VENTRICLE PLAX 2D LVIDd:         5.50 cm  Diastology LVIDs:         3.80 cm  LV e' medial:   4.28 cm/s LV PW:         1.60 cm  LV E/e' medial: 22.6 LV IVS:        1.80 cm LVOT diam:     2.30 cm LV SV:         64 LV SV Index:   35 LVOT Area:     4.15 cm  RIGHT VENTRICLE RV Basal diam:  3.90 cm RV S prime:     11.90 cm/s LEFT ATRIUM              Index       RIGHT ATRIUM           Index LA diam:        4.10 cm  2.25 cm/m  RA Area:     25.50 cm LA Vol (A2C):   139.0 ml 76.37 ml/m RA Volume:   89.10 ml  48.96 ml/m LA Vol (A4C):   137.0 ml 75.27 ml/m LA Biplane Vol: 140.0 ml 76.92 ml/m  AORTIC VALVE AV Area (Vmax):    1.94 cm AV Area (Vmean):   1.89 cm AV Area (VTI):  1.73 cm AV Vmax:           179.00 cm/s AV Vmean:          116.000 cm/s AV VTI:            0.367 m AV Peak Grad:      12.8 mmHg AV Mean Grad:      7.0 mmHg LVOT Vmax:         83.60 cm/s LVOT Vmean:        52.700 cm/s LVOT VTI:          0.153 m LVOT/AV VTI ratio: 0.42  AORTA Ao Root diam: 4.00 cm Ao Asc diam:  4.70 cm MITRAL VALVE               TRICUSPID VALVE MV Area (PHT): 3.95 cm    TR Peak grad:   35.8 mmHg MV Decel Time: 192 msec    TR Vmax:        299.00 cm/s MV E velocity: 96.80 cm/s                            SHUNTS                            Systemic VTI:  0.15 m                            Systemic Diam: 2.30 cm Fransico Him MD Electronically signed by Fransico Him MD Signature Date/Time: 10/07/2020/10:55:42 AM    Final    VAS [place] LOWER EXTREMITY VENOUS (DVT) (ONLY [facility] & [facility] 7a-7p)  Result Date: 11/02/2020  Lower Venous DVT Study Patient Name:  Robert Mcconnell  Date of Exam:   11/03/2020 Medical Rec #: [id number]       Accession #:    [id number] Date of Birth: 03/02/1922       Patient Gender: M Patient Age:   89Y Exam Location:  [facility] Procedure:      VAS [place] LOWER EXTREMITY VENOUS (DVT) Referring Phys: [id number] PETER C [place]  --------------------------------------------------------------------------------  Indications: Edema.  Comparison Study: no prior Performing Technologist: Archie Patten RVS  Examination Guidelines: A complete evaluation includes B-mode imaging, spectral Doppler, color Doppler, and power Doppler as needed of all accessible portions of each vessel. Bilateral testing is considered an integral part of a complete examination. Limited examinations for reoccurring indications may be performed as noted. The reflux portion of the exam is performed with the patient in reverse Trendelenburg.  +---------+---------------+---------+-----------+----------+-------------------+ RIGHT    CompressibilityPhasicitySpontaneityPropertiesThrombus Aging      +---------+---------------+---------+-----------+----------+-------------------+ CFV      Full           Yes      Yes                                      +---------+---------------+---------+-----------+----------+-------------------+ SFJ      Full                                                             +---------+---------------+---------+-----------+----------+-------------------+ FV Prox  Full                                                             +---------+---------------+---------+-----------+----------+-------------------+  FV Mid   Full                                                             +---------+---------------+---------+-----------+----------+-------------------+ FV DistalFull                                                             +---------+---------------+---------+-----------+----------+-------------------+ PFV      Full                                                             +---------+---------------+---------+-----------+----------+-------------------+ POP      Full           Yes      Yes                                       +---------+---------------+---------+-----------+----------+-------------------+ PTV      Full                                                             +---------+---------------+---------+-----------+----------+-------------------+ PERO                                                  Not well visualized +---------+---------------+---------+-----------+----------+-------------------+   +---------+---------------+---------+-----------+----------+-------------------+ LEFT     CompressibilityPhasicitySpontaneityPropertiesThrombus Aging      +---------+---------------+---------+-----------+----------+-------------------+ CFV      Full           Yes      Yes                                      +---------+---------------+---------+-----------+----------+-------------------+ SFJ      Full                                                             +---------+---------------+---------+-----------+----------+-------------------+ FV Prox  Full                                                             +---------+---------------+---------+-----------+----------+-------------------+ FV Mid   Full                                                             +---------+---------------+---------+-----------+----------+-------------------+  FV DistalFull                                                             +---------+---------------+---------+-----------+----------+-------------------+ PFV      Full                                                             +---------+---------------+---------+-----------+----------+-------------------+ POP      Full           Yes      Yes                                      +---------+---------------+---------+-----------+----------+-------------------+ PTV      Full                                                             +---------+---------------+---------+-----------+----------+-------------------+  PERO                                                  Not well visualized +---------+---------------+---------+-----------+----------+-------------------+     Summary: BILATERAL: - No evidence of deep vein thrombosis seen in the lower extremities, bilaterally. -No evidence of popliteal cyst, bilaterally.   *See table(s) above for measurements and observations. Electronically signed by Harold Barban MD on 10/07/2020 at 10:16:23 PM.    Final     Scheduled Meds: . apixaban  2.5 mg Oral BID  . atorvastatin  40 mg Oral QHS  . [START ON 10/08/2020] calcitRIOL  0.25 mcg Oral Q M,W,F  . docusate  150 mg Oral Daily  . famotidine  20 mg Oral Daily  . finasteride  5 mg Oral Daily  . furosemide  20 mg Intravenous BID  . levothyroxine  25 mcg Oral QAC breakfast  . metoprolol succinate  25 mg Oral Daily  . mirtazapine  15 mg Oral QHS  . senna  1 tablet Oral Daily  . sodium chloride flush  3 mL Intravenous Q12H  . terazosin  5 mg Oral BID   Continuous Infusions: . sodium chloride 250 mL (10/07/20 1013)  . azithromycin 500 mg (10/07/20 1114)  . cefTRIAXone (ROCEPHIN)  IV 2 g (10/07/20 1019)     LOS: 1 day   Marylu Lund, MD [facility] Pager On Amion  If 7PM-7AM, please contact night-coverage 10/07/2020, 6:08 PM

## 2020-10-07 NOTE — Progress Notes (Signed)
Pt resting in  Bed, no distress noted, side rails up for pt safety, call bell in reach, bed low and locked.

## 2020-10-07 NOTE — Evaluation (Signed)
Clinical/Bedside Swallow Evaluation Patient Details  Name: Robert Mcconnell MRN: [id number] Date of Birth: 05-Jan-1922  Today's Date: 10/07/2020 Time: SLP Start Time (ACUTE ONLY): 1048 SLP Stop Time (ACUTE ONLY): 1103 SLP Time Calculation (min) (ACUTE ONLY): 15.6 min  Past Medical History:  Past Medical History:  Diagnosis Date  . Atrial fibrillation ([place])   . BPH (benign prostatic hyperplasia)   . CKD (chronic kidney disease)   . History of coronary angioplasty with insertion of stent   . Hypertension   . Severe sepsis ([place]) 11/04/2019  . Stroke [facility])    Past Surgical History:  Past Surgical History:  Procedure Laterality Date  . COLECTOMY  2006   cancer   HPI:  Pt is a 87 y.o. male who presented with complaints of shortness of breath. Per MD's note on 6/1, nursing had to "suction a good amount of food out of the patient's mouth and airway as the patient does not seem to be able to swallow food properly." CXR 6/1: Airspace disease in the right mid and lower lung zones could be due to pneumonia or aspiration. PMH: hypertension, atrial fibrillation, CVA with residual right-sided hemiparesis, CKD stage III, and BPH. BSE in June 2021 dysphagia 2 and thin liquids recommended at that time.   Assessment / Plan / Recommendation Clinical Impression  Pt was seen for bedside swallow evaluation with his wife and caregiver present. Both parties denied the pt having any symptoms of oropharyngeal dysphagia at baseline. Pt was asleep upon SLP's entry, but roused with verbal and tactile stimulation. A complete oral mechanism was not conducted due to pt's difficulty following commands. Oral mucosa was dry and he presented with adequate dentition. Pt presented with an open-mouth posture throughout the evaluation despite verbal and tactile cues. Pt demonstrated impaired bolus awareness and no attempts were made at bolus manipulation or swallowing. It is recommended that his NPO status be maintained and that  frequent oral be provided to reduce oral pathogens and maintain the integrity of the oral mucosa. SLP will follow to assess improvement in swallow function. SLP Visit Diagnosis: Dysphagia, unspecified (R13.10)    Aspiration Risk  Severe aspiration risk;Risk for inadequate nutrition/hydration    Diet Recommendation NPO   Medication Administration: Via alternative means    Other  Recommendations Oral Care Recommendations: Oral care QID;Patient independent with oral care   Follow up Recommendations  (TBD)      Frequency and Duration min 2x/week  2 weeks       Prognosis Prognosis for Safe Diet Advancement: Guarded Barriers to Reach Goals: Severity of deficits;Cognitive deficits      Swallow Study   General Date of Onset: 10/23/2020 HPI: Pt is a 87 y.o. male who presented with complaints of shortness of breath. Per MD's note on 6/1, nursing had to "suction a good amount of food out of the patient's mouth and airway as the patient does not seem to be able to swallow food properly." CXR 6/1: Airspace disease in the right mid and lower lung zones could be due to pneumonia or aspiration. PMH: hypertension, atrial fibrillation, CVA with residual right-sided hemiparesis, CKD stage III, and BPH. BSE in June 2021 dysphagia 2 and thin liquids recommended at that time. Type of Study: Bedside Swallow Evaluation Previous Swallow Assessment: See HPI Diet Prior to this Study: NPO Temperature Spikes Noted: No Respiratory Status: Venti-mask History of Recent Intubation: No Behavior/Cognition: Alert;Cooperative;Confused Oral Cavity Assessment: Dried secretions;Dry Oral Care Completed by SLP: Yes Oral Cavity - Dentition: Adequate natural dentition  Vision: Functional for self-feeding Self-Feeding Abilities: Total assist Baseline Vocal Quality: Not observed Volitional Cough: Cognitively unable to elicit Volitional Swallow: Unable to elicit    Oral/Motor/Sensory Function Overall Oral Motor/Sensory  Function:  (UTA)   Ice Chips Ice chips: Impaired Presentation: Spoon Oral Phase Impairments: Reduced lingual movement/coordination;Poor awareness of bolus   Thin Liquid Thin Liquid: Not tested    Nectar Thick Nectar Thick Liquid: Not tested   Honey Thick Honey Thick Liquid: Not tested   Puree Puree: Impaired Presentation: Spoon Oral Phase Impairments: Poor awareness of bolus;Reduced lingual movement/coordination   Solid     Solid: Not tested     Robert Mcconnell, [place], [place] Office number [phone] Pager [phone]  Horton Marshall 10/07/2020,11:25 AM

## 2020-10-07 NOTE — Evaluation (Signed)
Occupational Therapy Evaluation Patient Details Name: Robert Mcconnell MRN: [id number] DOB: Oct 03, 1921 Today's Date: 10/07/2020    History of Present Illness 87 y.o. male presenting to ED on 6/1 with SOB, temp 99.1, cough and lethargy. Patient admitted with acute respiratory failure with hypoxia and hypercapnia and sepsis secondary to pneumonia. Concer for aspiration pneumonia. PMHx significant for HTN, A-fib, CVA with residual R-sided weakness, CKD III, and BPH.   Clinical Impression   PTA patient was living with his spouse in a 2-level private residence with ramped entry and stair lift to bedroom/bathroom on 2nd floor. PCA provides assist from 9am-4:30pm M-F. Patient unable to provide PLOF or home set-up with information obtained from wife present at bedside. Wife states that at baseline daughter/PCA complete all self-care tasks including grooming. Patient is occasionally able to feed himself and grasp a cup with use of non-dominant LUE. Wife reports patient requires heavy assist with all functional transfers from hospital bed > power wheelchair > stair lift as he's sometimes "a noodle" and reports that family nor PCA have been using hoyer lift recently. Per last OPPT note from 5/27, patient completes lateral scoot transfers with Min to Mod A and max multimodal cues at best. Patient currently dependent with all ADLs, follows simple 1-step verbal commands with 0% accuracy, and is unable to make needs known. Patient also limited by deficits listed below including residual L hemiplegia, generalized weakness/debility, and decreased cognition and would benefit from continued acute OT services in prep for safe d/c to next level of care. Wife declining SNF rehab with desire for patient to return home as PCA/family is able to care for him. OT will continue to follow acutely.      Follow Up Recommendations  SNF;Home health OT;Supervision/Assistance - 24 hour    Equipment Recommendations  Other (comment)  (Repair hoyer lift (replace battery))    Recommendations for Other Services Speech consult     Precautions / Restrictions Precautions Precautions: Fall Restrictions Weight Bearing Restrictions: No      Mobility Bed Mobility Overal bed mobility: Needs Assistance             General bed mobility comments: Grossly dependent    Transfers                 General transfer comment: Deferred for patient/therapist safety.    Balance                                           ADL either performed or assessed with clinical judgement   ADL Overall ADL's : Needs assistance/impaired Eating/Feeding: NPO   Grooming: Total assistance Grooming Details (indicate cue type and reason): Patient does not attempt to assist.                               General ADL Comments: Dependent     Vision Baseline Vision/History: Wears glasses Wears Glasses: At all times Patient Visual Report: Other (comment) (Patient unable to state.)       Perception     Praxis      Pertinent Vitals/Pain Pain Assessment: Faces Faces Pain Scale: No hurt Pain Intervention(s): Monitored during session     Hand Dominance Right   Extremity/Trunk Assessment Upper Extremity Assessment Upper Extremity Assessment: RUE deficits/detail;LUE deficits/detail RUE Deficits / Details: No spontaneous movement noted. Hand resting with  digits and wrist in flexion. Able to be stretched passively to full extension. No facial grimacing noted. 2 finger subluxation at shoulder joint. RUE Coordination: decreased fine motor;decreased gross motor LUE Deficits / Details: Patient with tremor noted. Wife reports this is new. PROM WFL. Patient does not maintain position of LUE in space against gravity.   Lower Extremity Assessment Lower Extremity Assessment: Defer to PT evaluation       Communication Communication Communication: Receptive difficulties;Expressive difficulties;Other  (comment) (No attempts to verbalize this date.)   Cognition Arousal/Alertness: Lethargic Behavior During Therapy: Flat affect Overall Cognitive Status: Difficult to assess                                 General Comments: Patient following commands with 0% accuracy this date. Does not attempt to verbalize. Does not squeeze hand or blink eyes on command. Nods head "yes" when asked to state his name. Wife present at bedside states that patient is below his cognitive baseline.   General Comments  Wife Fraser Din present at bedside to provide PLOF and home set-up. Patient does not attempt to verbalize with wife or this Probation officer. Wife reports decline over last several days prior to admission. Edematous BLE noted.    Exercises     Shoulder Instructions      Home Living Family/patient expects to be discharged to:: Private residence Living Arrangements: Spouse/significant other Available Help at Discharge: Family;Personal care attendant;Available 24 hours/day (PCA 9am-4:30pm M-F.) Type of Home: House Home Access: Ramped entrance     Home Layout: Two level Alternate Level Stairs-Number of Steps: flight to bedroom with stair lift             Home Equipment: Wheelchair - power;Hospital bed;Other (comment) (hoyer, power wheelchair, chair lift)   Additional Comments: Reports PCA and family transfer him without hoyer.      Prior Functioning/Environment Level of Independence: Needs assistance  Gait / Transfers Assistance Needed: Per OPPT note 5/25, patient completing lateral scoot transfers to L with Min to Mod A and Max multimodal cues. ADL's / Homemaking Assistance Needed: Total A for ADLs grossly. WIfe reports patient occasional requires assist for self-feeding. Communication / Swallowing Assistance Needed: Wife reports patient usually able to state name. Comments: Active with OPPT 1x weekly        OT Problem List: Decreased strength;Decreased range of motion;Decreased  activity tolerance;Impaired balance (sitting and/or standing);Decreased coordination;Decreased cognition;Cardiopulmonary status limiting activity;Impaired tone;Impaired UE functional use;Increased edema      OT Treatment/Interventions: Self-care/ADL training;Therapeutic exercise;Therapeutic activities;Cognitive remediation/compensation;Patient/family education;Balance training    OT Goals(Current goals can be found in the care plan section) Acute Rehab OT Goals Patient Stated Goal: Per wife; for patient to return home. OT Goal Formulation: With patient/family Time For Goal Achievement: 10/21/20 Potential to Achieve Goals: Fair ADL Goals Additional ADL Goal #1: Patient will follow 1-step verbal commands with 50% accuracy in prep for ADLs at bed level. Additional ADL Goal #2: Patient will complete bed mobility with Mod A +1 in prep for functioanl transfers. Additional ADL Goal #3: Patient will complete 1/3 grooming tasks at bed level with Min A and max multimodal cues.  OT Frequency: Min 2X/week   Barriers to D/C:            Co-evaluation              AM-PAC OT "6 Clicks" Daily Activity     Outcome Measure Help from another person  eating meals?: Total Help from another person taking care of personal grooming?: Total Help from another person toileting, which includes using toliet, bedpan, or urinal?: Total Help from another person bathing (including washing, rinsing, drying)?: Total Help from another person to put on and taking off regular upper body clothing?: Total Help from another person to put on and taking off regular lower body clothing?: Total 6 Click Score: 6   End of Session Equipment Utilized During Treatment: Oxygen;Other (comment) (5L via venti mask)  Activity Tolerance: Patient limited by fatigue;Patient limited by lethargy Patient left: in bed;with call bell/phone within reach;with bed alarm set;with family/visitor present  OT Visit Diagnosis: Muscle weakness  (generalized) (M62.81);Cognitive communication deficit (R41.841);Hemiplegia and hemiparesis Symptoms and signs involving cognitive functions: Cerebral infarction Hemiplegia - Right/Left: Right Hemiplegia - dominant/non-dominant: Dominant Hemiplegia - caused by: Cerebral infarction                [id number] OT Time Calculation (min): 20 min Charges:  OT General Charges $OT Visit: 1 Visit OT Evaluation $OT Eval Moderate Complexity: 1 Mod  Kourtnie Sachs H. OTR/L Supplemental OT, Department of rehab services [phone]  Kentarius Partington R H. 10/07/2020, 9:17 AM

## 2020-10-08 ENCOUNTER — Ambulatory Visit: Payer: Non-veteran care

## 2020-10-08 DIAGNOSIS — Z515 Encounter for palliative care: Secondary | ICD-10-CM

## 2020-10-08 DIAGNOSIS — Z7189 Other specified counseling: Secondary | ICD-10-CM

## 2020-10-08 DIAGNOSIS — I482 Chronic atrial fibrillation, unspecified: Secondary | ICD-10-CM

## 2020-10-08 DIAGNOSIS — J189 Pneumonia, unspecified organism: Secondary | ICD-10-CM

## 2020-10-08 DIAGNOSIS — Z66 Do not resuscitate: Secondary | ICD-10-CM

## 2020-10-08 LAB — BASIC METABOLIC PANEL
Anion gap: 7 (ref 5–15)
BUN: 40 mg/dL — ABNORMAL HIGH (ref 8–23)
CO2: 31 mmol/L (ref 22–32)
Calcium: 8.2 mg/dL — ABNORMAL LOW (ref 8.9–10.3)
Chloride: 109 mmol/L (ref 98–111)
Creatinine, Ser: 1.92 mg/dL — ABNORMAL HIGH (ref 0.61–1.24)
GFR, Estimated: 31 mL/min — ABNORMAL LOW (ref >60)
Glucose, Bld: 89 mg/dL (ref 70–99)
Potassium: 3 mmol/L — ABNORMAL LOW (ref 3.5–5.1)
Sodium: 147 mmol/L — ABNORMAL HIGH (ref 135–145)

## 2020-10-08 LAB — CBC
HCT: 43.2 % (ref 39.0–52.0)
Hemoglobin: 12.9 g/dL — ABNORMAL LOW (ref 13.0–17.0)
MCH: 29.6 pg (ref 26.0–34.0)
MCHC: 29.9 g/dL — ABNORMAL LOW (ref 30.0–36.0)
MCV: 99.1 fL (ref 80.0–100.0)
Platelets: 89 10*3/uL — ABNORMAL LOW (ref 150–400)
RBC: 4.36 MIL/uL (ref 4.22–5.81)
RDW: 16.4 % — ABNORMAL HIGH (ref 11.5–15.5)
WBC: 4.7 10*3/uL (ref 4.0–10.5)
nRBC: 0 % (ref 0.0–0.2)

## 2020-10-08 LAB — HEPARIN LEVEL (UNFRACTIONATED): Heparin Unfractionated: 0.73 IU/mL — ABNORMAL HIGH (ref 0.30–0.70)

## 2020-10-08 LAB — APTT: aPTT: 37 seconds — ABNORMAL HIGH (ref 24–36)

## 2020-10-08 MED ORDER — HYDRALAZINE HCL 20 MG/ML IJ SOLN: 10.0000 mg | INTRAMUSCULAR | Status: DC | PRN

## 2020-10-08 MED ORDER — POTASSIUM CHLORIDE 10 MEQ/100ML IV SOLN
10.0000 meq | INTRAVENOUS | Status: AC
  Administered 2020-10-08 (×6): 10 meq via INTRAVENOUS
  Filled 2020-10-08 (×6): qty 100

## 2020-10-08 MED ORDER — LEVOTHYROXINE SODIUM 100 MCG/5ML IV SOLN
12.5000 ug | Freq: Every day | INTRAVENOUS | Status: DC
  Administered 2020-10-08 – 2020-10-12 (×5): 12.5 ug via INTRAVENOUS
  Filled 2020-10-08 (×6): qty 5

## 2020-10-08 MED ORDER — FAMOTIDINE IN NACL 20-0.9 MG/50ML-% IV SOLN
20.0000 mg | INTRAVENOUS | Status: DC
  Administered 2020-10-08 – 2020-10-12 (×5): 20 mg via INTRAVENOUS
  Filled 2020-10-08 (×7): qty 50

## 2020-10-08 MED ORDER — DEXTROSE-NACL 5-0.9 % IV SOLN: INTRAVENOUS | Status: DC

## 2020-10-08 MED ORDER — METOPROLOL TARTRATE 5 MG/5ML IV SOLN
5.0000 mg | Freq: Four times a day (QID) | INTRAVENOUS | Status: DC
  Administered 2020-10-08 – 2020-10-09 (×5): 5 mg via INTRAVENOUS
  Filled 2020-10-08 (×5): qty 5

## 2020-10-08 MED ORDER — HEPARIN (PORCINE) 25000 UT/250ML-% IV SOLN
1400.0000 [IU]/h | INTRAVENOUS | Status: DC
  Administered 2020-10-08: 1000 [IU]/h via INTRAVENOUS
  Filled 2020-10-08 (×2): qty 250

## 2020-10-08 NOTE — Progress Notes (Signed)
Communication sent to MD and palliative that pt continues to pull off mask. But this time when asked if he is purposely pulling off pt nodded yes. Pt informed that not having mask may prevent him from having oxygen and breathing. Pt nodded yes, patted his chest and took off mask. Pt asked was he trying to stop breathing. Pt nodded yes and then took off mask several times.

## 2020-10-08 NOTE — Progress Notes (Signed)
  Speech Language Pathology Treatment: Dysphagia  Patient Details Name: Robert Mcconnell MRN: [id number] DOB: 10-17-1921 Today's Date: 10/08/2020 Time: [id number] SLP Time Calculation (min) (ACUTE ONLY): 16.1 min  Assessment / Plan / Recommendation Clinical Impression  Robert Mcconnell was seen for dysphagia treatment with his wife present. He was alert throughout the session with increased vocalization and unintelligible verbalization. Robert Mcconnell continues to demonstrate poor bolus awareness with absent bolus manipulation, and no swallowing was elicited despite verbal and tactile prompts from this SLP and his wife. Robert Mcconnell did close his mouth intermittently after bolus presentation, but this appeared to be due to the necessity of this for speech production, and not due to improvement in swallow function. Robert Mcconnell still does not present as a candidate for safe p.o. intake. SLP is in agreement with plan for palliative care consult to help determine goals of care. Should family wish full scope of care, short-term non-oral means of alimentation would be recommended. SLP will continue to follow Robert Mcconnell pending [place] decision.    HPI HPI: Robert Mcconnell is a 87 y.o. male who presented with complaints of shortness of breath. Per MD's note on 6/1, nursing had to "suction a good amount of food out of the patient's mouth and airway as the patient does not seem to be able to swallow food properly." CXR 6/1: Airspace disease in the right mid and lower lung zones could be due to pneumonia or aspiration. PMH: hypertension, atrial fibrillation, CVA with residual right-sided hemiparesis, CKD stage III, and BPH. BSE in June 2021 dysphagia 2 and thin liquids recommended at that time.      SLP Plan  Continue with current plan of care       Recommendations  Diet recommendations: NPO Medication Administration: Via alternative means                Oral Care Recommendations: Oral care QID;Patient independent with oral care Follow up Recommendations:  (TBD) SLP  Visit Diagnosis: Dysphagia, unspecified (R13.10) Plan: Continue with current plan of care       Pheng Prokop I. Hardin Negus, [place], [place] Office number [phone] Pager [place] 10/08/2020, 9:17 AM

## 2020-10-08 NOTE — Progress Notes (Addendum)
ANTICOAGULATION CONSULT NOTE - Initial Consult  Pharmacy Consult for heparin Indication: atrial fibrillation and stroke, DVT  No Known Allergies  Patient Measurements: Height: '5\' 7"'$  (170.2 cm) Weight: 70.8 kg (156 lb 1.4 oz) IBW/kg (Calculated) : 66.1 Heparin Dosing Weight: 70kg  Vital Signs: Temp: 98 F (36.7 C) (06/03 0450) Temp Source: Axillary (06/03 0450) BP: 175/92 (06/03 0750) Pulse Rate: 72 (06/03 0750)  Labs: Recent Labs    10/20/2020 1255 10/19/2020 1844 10/07/20 0211 10/08/20 0209 10/08/20 0816  HGB 13.0  --  11.4*  --  12.9*  HCT 44.0  --  38.8*  --  43.2  PLT 86*  --  80*  --  89*  LABPROT 16.5*  --   --   --   --   INR 1.3*  --   --   --   --   CREATININE 1.76*  --  1.65* 1.92*  --   TROPONINIHS 44* 54*  --   --   --     Estimated Creatinine Clearance: 21 mL/min (A) (by C-G formula based on SCr of 1.92 mg/dL (H)).   Medical History: Past Medical History:  Diagnosis Date  . Atrial fibrillation ([place])   . BPH (benign prostatic hyperplasia)   . CKD (chronic kidney disease)   . History of coronary angioplasty with insertion of stent   . Hypertension   . Severe sepsis ([place]) 11/04/2019  . Stroke [facility])     Medications:  Medications Prior to Admission  Medication Sig Dispense Refill Last Dose  . apixaban (ELIQUIS) 2.5 MG TABS tablet Take 2.5 mg by mouth 2 (two) times daily.    11/01/2020 at Unknown time  . atorvastatin (LIPITOR) 40 MG tablet Take 40 mg by mouth at bedtime.    10/05/2020 at Unknown time  . calcitRIOL (ROCALTROL) 1 MCG/ML solution Take 0.25 mcg by mouth every Monday, Wednesday, and Friday.    Past Week at Unknown time  . Docusate Sodium 150 MG/15ML syrup Take 150 mg by mouth daily.    10/23/2020 at Unknown time  . famotidine (PEPCID) 20 MG tablet Take 20 mg by mouth daily.    11/04/2020 at Unknown time  . finasteride (PROSCAR) 5 MG tablet Take 5 mg by mouth daily.   10/10/2020 at Unknown time  . levothyroxine (SYNTHROID) 25 MCG tablet Take 25 mcg by  mouth daily before breakfast.   11/03/2020 at Unknown time  . metoprolol succinate (TOPROL-XL) 25 MG 24 hr tablet Take 25 mg by mouth daily.   10/30/2020 at 0800  . mirtazapine (REMERON) 15 MG tablet Take 15 mg by mouth at bedtime.   10/05/2020 at Unknown time  . senna (SENOKOT) 8.6 MG TABS tablet Take 1 tablet by mouth daily.   10/21/2020 at Unknown time  . terazosin (HYTRIN) 5 MG capsule Take 5 mg by mouth 2 (two) times daily.    10/09/2020 at Unknown time  . Zinc Sulfate (ZINC 15 PO) Take 15 mg by mouth 2 (two) times daily.   10/28/2020 at Unknown time   Scheduled:  . levothyroxine  12.5 mcg Intravenous Daily  . metoprolol tartrate  5 mg Intravenous Q6H  . sodium chloride flush  3 mL Intravenous Q12H    Assessment: Pt was admitted for resp distress. He has been on apixaban for a hx of CVA/AF/DVT. He is currently NPO. He also has chronic thrombocytopenia. Ok to bridge with heparin for now per Dr. Wyline Copas. We will use a lower goal due to his thrombocytopenia issue.  We will follow up with PTT and HL to see if they correlate.   Last dose of apixaban on 6/1 Scr 1.92  Goal of Therapy:  Heparin level 0.3-0.5 units/ml  APTT 66-85 Monitor platelets by anticoagulation protocol: Yes   Plan:  Heparin 1000 units/hr 8 hr PTT/HL  Daily HL, CBC F/u with resuming apixaban  Onnie Boer, PharmD, BCIDP, AAHIVP, CPP Infectious Disease Pharmacist 10/08/2020 11:00 AM

## 2020-10-08 NOTE — Progress Notes (Signed)
Spoke with MD and request for a sitter d/t pt status and behaviors. Message sent to charge nurse to make aware.

## 2020-10-08 NOTE — Progress Notes (Signed)
PROGRESS NOTE    Robert Mcconnell  [id number] DOB: Aug 16, 1921 DOA: 10/29/2020 PCP: [name], MD    Brief Narrative:  87 y.o. male with medical history significant of hypertension, atrial fibrillation, CVA with residual right-sided hemiparesis, CKD stage III, and BPH presents with complaints of shortness of breath. Noted to be more lethargic recently. Pt was recommended to be on a thickened liquid diet post-prior CVA , however had been on regular diet over the past 16 mos. Family is reportedly already aware of high risk for aspiration prior to visit.  Assessment & Plan:   Principal Problem:   Acute respiratory failure with hypoxia and hypercapnia (HCC) Active Problems:   CKD (chronic kidney disease) stage 4, GFR 15-29 ml/min (HCC)   Thrombocytopenia (HCC)   Atrial fibrillation, chronic (HCC)   Dysphagia   History of CVA with residual deficit   Aspiration pneumonia (HCC)   Hypothyroidism   Acute on chronic systolic CHF (congestive heart failure) (HCC)  Acute respiratory failure with hypoxia and hypercapnia  Sepsis secondary to pneumonia:  -Presented with tachycardia, tachypnea with O2 sats 79% on RA initially -Overnight, observed to aspirate on trial of PO intake -Remained on venturimask this AM -CXR reviewed with findings suggestive of airspace disease in the R mid-lower lung zones due to aspiration -Seen by SLP, continued recs for NPO status -Likely poor long term prognosis -for now, continue azithromycin and rocephin -cont to wean O2 as tolerated  Systolic congestive heart failure chronic, not in exacerbation:  -noted to have +1 pitting edema in the lower extremities and wet sounding lungs.  BNP elevated at 982.  Last EF was noted to be around 30-35% in 2021. -I&O's and daily weights  -2d echo reviewed. EF of 60-65% -Will recheck bmet in AM -Clinically dehydrated on exam, start basal IVF    Chronic atrial fibrillation on anticoagulation:  -Patient appears  currently rate controlled and has been compliant with anticoagulation. -Pt currently NPO -Have transitioned to IV lopressor and heparin gtt  Acute on CKD stage IV: -Cr trended up to 1.92 this AM -Clinically appears dehydrated on exam -cont on basal IVF -repeat bmet in AM  Hypothyroidism: Last TSH was 3.379 on 11/03/2019 -TSH 3.168 -Continue levothyroxine, but will change to equivalent dosed IV levothyroxine as pt is NPO  Thrombocytopenia: Chronic.   -Plts down to 80 on 6/2, up to 89 this AM -now on heparin gtt per above -recheck cbc in AM  History of recurrent UTI -UA pending  BPH -Was continued on terazosin and Proscar, currently on hold secondary to NPO status  Hyperlipidemia -was continued on atorvastatin, currently on hold secondary to NPO status  Goals of Care -Pt's wishes are known to be DNR -Given recent events, have consulted Palliative Care -Appreciate recs. Family is aware of the difficult situation, primarily dysphagia. Established pt's wishes for no feeding tubes -Will cont to follow and manage over weekend with hopes that pt would improve to be able to tolerate PO safely -If pt does not improve by next week, would re-address goals  DVT prophylaxis: Eliquis Code Status: DNR Family Communication: Pt in room, family over phone  Status is: Inpatient  Remains inpatient appropriate because:Inpatient level of care appropriate due to severity of illness   Dispo: The patient is from: Home              Anticipated d/c is to: Unknown at this time              Patient currently is  not medically stable to d/c.   Difficult to place patient No    Consultants:   Palliative Care  Procedures:     Antimicrobials: Anti-infectives (From admission, onward)   Start     Dose/Rate Route Frequency Ordered Stop   10/08/20 1600  vancomycin (VANCOREADY) IVPB 1250 mg/250 mL  Status:  Discontinued        1,250 mg 166.7 mL/hr over 90 Minutes Intravenous Every 48  hours 10/12/2020 1456 11/01/2020 1615   10/07/20 1400  ceFEPIme (MAXIPIME) 2 g in sodium chloride 0.9 % 100 mL IVPB  Status:  Discontinued        2 g 200 mL/hr over 30 Minutes Intravenous Every 24 hours 10/09/2020 1451 10/19/2020 1615   10/07/20 1000  cefTRIAXone (ROCEPHIN) 2 g in sodium chloride 0.9 % 100 mL IVPB        2 g 200 mL/hr over 30 Minutes Intravenous Every 24 hours 10/08/2020 1615 10/12/20 0959   10/07/20 1000  azithromycin (ZITHROMAX) 500 mg in sodium chloride 0.9 % 250 mL IVPB        500 mg 250 mL/hr over 60 Minutes Intravenous Every 24 hours 10/27/2020 1615 10/11/20 0959   10/09/2020 1300  vancomycin (VANCOREADY) IVPB 1500 mg/300 mL        1,500 mg 150 mL/hr over 120 Minutes Intravenous  Once 10/23/2020 1253 10/17/2020 1658   10/21/2020 1300  ceFEPIme (MAXIPIME) 2 g in sodium chloride 0.9 % 100 mL IVPB        2 g 200 mL/hr over 30 Minutes Intravenous  Once 10/11/2020 1253 10/27/2020 1448      Subjective: Much more alert, does not want to wear venturi mask  Objective: Vitals:   10/08/20 0002 10/08/20 0450 10/08/20 0750 10/08/20 1338  BP: (!) 146/90 (!) 173/83 (!) 175/92 (!) 170/84  Pulse: 68 80 72 60  Resp: '20 20 17 20  '$ Temp: 98.1 F (36.7 C) 98 F (36.7 C)  97.7 F (36.5 C)  TempSrc: Oral Axillary  Axillary  SpO2: 91% 98% 96% 95%  Weight:      Height:        Intake/Output Summary (Last 24 hours) at 10/08/2020 1450 Last data filed at 10/08/2020 1341 Gross per 24 hour  Intake 250.07 ml  Output 2850 ml  Net -2599.93 ml   Filed Weights   10/07/20 0828  Weight: 70.8 kg    Examination: General exam: Conversant, in no acute distress Respiratory system: normal chest rise, clear, no audible wheezing, venturi mask in place Cardiovascular system: regular rhythm, s1-s2 Gastrointestinal system: Nondistended, nontender, pos BS Central nervous system: No seizures, no tremors Extremities: No cyanosis, no joint deformities Skin: No rashes, no pallor Psychiatry: Unable to assess given  mentation  Data Reviewed: I have personally reviewed following labs and imaging studies  CBC: Recent Labs  Lab 10/24/2020 1250 10/08/2020 1255 10/07/20 0211 10/08/20 0816  WBC  --  3.6* 3.6* 4.7  NEUTROABS  --  3.0 2.9  --   HGB 12.2* 13.0 11.4* 12.9*  HCT 36.0* 44.0 38.8* 43.2  MCV  --  101.4* 101.3* 99.1  PLT  --  86* 80* 89*   Basic Metabolic Panel: Recent Labs  Lab 10/21/2020 1250 10/29/2020 1255 10/07/20 0211 10/08/20 0209  NA 146* 143 144 147*  K 3.6 3.8 3.5 3.0*  CL  --  109 107 109  CO2  --  '29 28 31  '$ GLUCOSE  --  153* 127* 89  BUN  --  32* 33* 40*  CREATININE  --  1.76* 1.65* 1.92*  CALCIUM  --  8.4* 8.0* 8.2*  MG  --   --  2.1  --    GFR: Estimated Creatinine Clearance: 21 mL/min (A) (by C-G formula based on SCr of 1.92 mg/dL (H)). Liver Function Tests: Recent Labs  Lab 10/29/2020 1255  AST 22  ALT 13  ALKPHOS 52  BILITOT 0.7  PROT 6.6  ALBUMIN 2.7*   No results for input(s): LIPASE, AMYLASE in the last 168 hours. No results for input(s): AMMONIA in the last 168 hours. Coagulation Profile: Recent Labs  Lab 10/29/2020 1255  INR 1.3*   Cardiac Enzymes: No results for input(s): CKTOTAL, CKMB, CKMBINDEX, TROPONINI in the last 168 hours. BNP (last 3 results) No results for input(s): PROBNP in the last 8760 hours. HbA1C: No results for input(s): HGBA1C in the last 72 hours. CBG: No results for input(s): GLUCAP in the last 168 hours. Lipid Profile: No results for input(s): CHOL, HDL, LDLCALC, TRIG, CHOLHDL, LDLDIRECT in the last 72 hours. Thyroid Function Tests: Recent Labs    10/30/2020 1844  TSH 3.168   Anemia Panel: No results for input(s): VITAMINB12, FOLATE, FERRITIN, TIBC, IRON, RETICCTPCT in the last 72 hours. Sepsis Labs: Recent Labs  Lab 10/11/2020 1255 10/17/2020 1844  PROCALCITON  --  0.28  LATICACIDVEN 2.0* 1.1    Recent Results (from the past 240 hour(s))  Resp Panel by RT-PCR (Flu A&B, Covid) Nasopharyngeal Swab     Status: None    Collection Time: 10/31/2020 12:44 PM   Specimen: Nasopharyngeal Swab; Nasopharyngeal(NP) swabs in vial transport medium  Result Value Ref Range Status   SARS Coronavirus 2 by RT PCR NEGATIVE NEGATIVE Final    Comment: (NOTE) SARS-CoV-2 target nucleic acids are NOT DETECTED.  The SARS-CoV-2 RNA is generally detectable in upper respiratory specimens during the acute phase of infection. The lowest concentration of SARS-CoV-2 viral copies this assay can detect is 138 copies/mL. A negative result does not preclude SARS-Cov-2 infection and should not be used as the sole basis for treatment or other patient management decisions. A negative result may occur with  improper specimen collection/handling, submission of specimen other than nasopharyngeal swab, presence of viral mutation(s) within the areas targeted by this assay, and inadequate number of viral copies(<138 copies/mL). A negative result must be combined with clinical observations, patient history, and epidemiological information. The expected result is Negative.  Fact Sheet for Patients:  EntrepreneurPulse.com.au  Fact Sheet for Healthcare Providers:  IncredibleEmployment.be  This test is no t yet approved or cleared by the [place] FDA and  has been authorized for detection and/or diagnosis of SARS-CoV-2 by FDA under an Emergency Use Authorization (EUA). This EUA will remain  in effect (meaning this test can be used) for the duration of the COVID-19 declaration under Section 564(b)(1) of the Act, 21 U.S.C.section 360bbb-3(b)(1), unless the authorization is terminated  or revoked sooner.       Influenza A by PCR NEGATIVE NEGATIVE Final   Influenza B by PCR NEGATIVE NEGATIVE Final    Comment: (NOTE) The Xpert Xpress SARS-CoV-2/FLU/RSV plus assay is intended as an aid in the diagnosis of influenza from Nasopharyngeal swab specimens and should not be used as a sole basis for treatment.  Nasal washings and aspirates are unacceptable for Xpert Xpress SARS-CoV-2/FLU/RSV testing.  Fact Sheet for Patients: EntrepreneurPulse.com.au  Fact Sheet for Healthcare Providers: IncredibleEmployment.be  This test is not yet approved or cleared by the [place] and has been authorized  for detection and/or diagnosis of SARS-CoV-2 by FDA under an Emergency Use Authorization (EUA). This EUA will remain in effect (meaning this test can be used) for the duration of the COVID-19 declaration under Section 564(b)(1) of the Act, 21 U.S.C. section 360bbb-3(b)(1), unless the authorization is terminated or revoked.  Performed at [facility], [place] [address], [place], [place] [address]   Culture, blood (routine x 2)     Status: None (Preliminary result)   Collection Time: 10/20/2020  2:13 PM   Specimen: BLOOD RIGHT FOREARM  Result Value Ref Range Status   Specimen Description BLOOD RIGHT FOREARM  Final   Special Requests   Final    BOTTLES DRAWN AEROBIC AND ANAEROBIC Blood Culture results may not be optimal due to an inadequate volume of blood received in culture bottles   Culture   Final    NO GROWTH 2 DAYS Performed at [facility], [place] [address] [place], [place] [address]    Report Status PENDING  Incomplete  Culture, blood (routine x 2)     Status: None (Preliminary result)   Collection Time: 10/17/2020  6:44 PM   Specimen: BLOOD  Result Value Ref Range Status   Specimen Description BLOOD SITE NOT SPECIFIED  Final   Special Requests   Final    BOTTLES DRAWN AEROBIC AND ANAEROBIC Blood Culture adequate volume   Culture   Final    NO GROWTH 1 DAY Performed at [facility], [place] [address], [place], [place] [address]    Report Status PENDING  Incomplete     Radiology Studies: ECHOCARDIOGRAM COMPLETE  Result Date: 10/07/2020    ECHOCARDIOGRAM REPORT   Patient Name:   ROCKLAN GABER Date of Exam: 10/07/2020 Medical Rec #:   [id number]      Height:       67.0 in Accession #:    [id number]     Weight:       156.1 lb Date of Birth:  21-Sep-1921      BSA:          1.820 m Patient Age:    19 years       BP:           108/65 mmHg Patient Gender: M              HR:           63 bpm. Exam Location:  Inpatient Procedure: 2D Echo, Cardiac Doppler and Color Doppler Indications:    CHF  History:        Patient has prior history of Echocardiogram examinations, most                 recent 11/04/2019. CAD, Stroke, Arrythmias:Atrial Fibrillation;                 Risk Factors:Hypertension.  Sonographer:    Cammy Brochure Referring Phys: [id number] RONDELL A SMITH IMPRESSIONS  1. Left ventricular ejection fraction, by estimation, is 60 to 65%. The left ventricle has normal function. The left ventricle has no regional wall motion abnormalities. There is moderate concentric left ventricular hypertrophy and severe basal septal hypertrophy.  2. Right ventricular systolic function is normal. The right ventricular size is normal.  3. Left atrial size was severely dilated.  4. Right atrial size was severely dilated.  5. Tricuspid valve regurgitation is mild to moderate.  6. The mitral valve is degenerative. Mild mitral valve regurgitation. No evidence of mitral stenosis.  7.  The aortic valve is calcified. There is moderate calcification of the aortic valve. There is moderate thickening of the aortic valve. Aortic valve regurgitation is mild. Mild aortic valve stenosis. Aortic valve area, by VTI measures 1.73 cm. Aortic valve mean gradient measures 7.0 mmHg. Aortic valve Vmax measures 1.79 m/s.  8. Aortic dilatation noted. Aneurysm of the ascending aorta, measuring 47 mm. There is mild dilatation of the aortic root, measuring 40 mm.  9. The inferior vena cava is normal in size with greater than 50% respiratory variability, suggesting right atrial pressure of 3 mmHg. 10. There is a trivial pericardial effusion is posterior to the left ventricle. FINDINGS  Left  Ventricle: Left ventricular ejection fraction, by estimation, is 45 to 50%. The left ventricle has mildly decreased function. The left ventricle demonstrates global hypokinesis. The left ventricular internal cavity size was normal in size. There is  moderate concentric left ventricular hypertrophy. Left ventricular diastolic function could not be evaluated due to atrial fibrillation. Left ventricular diastolic function could not be evaluated. Elevated left ventricular end-diastolic pressure. Right Ventricle: The right ventricular size is normal. No increase in right ventricular wall thickness. Right ventricular systolic function is normal. Left Atrium: Left atrial size was severely dilated. Right Atrium: Right atrial size was severely dilated. Pericardium: Trivial pericardial effusion is present. The pericardial effusion is posterior to the left ventricle. Mitral Valve: The mitral valve is degenerative in appearance. There is mild thickening of the mitral valve leaflet(s). There is mild calcification of the mitral valve leaflet(s). Mild mitral annular calcification. Mild mitral valve regurgitation. No evidence of mitral valve stenosis. Tricuspid Valve: The tricuspid valve is normal in structure. Tricuspid valve regurgitation is mild to moderate. No evidence of tricuspid stenosis. Aortic Valve: The aortic valve is calcified. There is moderate calcification of the aortic valve. There is moderate thickening of the aortic valve. Aortic valve regurgitation is mild. Mild aortic stenosis is present. Aortic valve mean gradient measures 7.0 mmHg. Aortic valve peak gradient measures 12.8 mmHg. Aortic valve area, by VTI measures 1.73 cm. Pulmonic Valve: The pulmonic valve was normal in structure. Pulmonic valve regurgitation is trivial. No evidence of pulmonic stenosis. Aorta: Aortic dilatation noted. There is mild dilatation of the aortic root, measuring 40 mm. There is an aneurysm involving the ascending aorta measuring 47  mm. Venous: The inferior vena cava is normal in size with greater than 50% respiratory variability, suggesting right atrial pressure of 3 mmHg. IAS/Shunts: No atrial level shunt detected by color flow Doppler.  LEFT VENTRICLE PLAX 2D LVIDd:         5.50 cm  Diastology LVIDs:         3.80 cm  LV e' medial:   4.28 cm/s LV PW:         1.60 cm  LV E/e' medial: 22.6 LV IVS:        1.80 cm LVOT diam:     2.30 cm LV SV:         64 LV SV Index:   35 LVOT Area:     4.15 cm  RIGHT VENTRICLE RV Basal diam:  3.90 cm RV S prime:     11.90 cm/s LEFT ATRIUM              Index       RIGHT ATRIUM           Index LA diam:        4.10 cm  2.25 cm/m  RA Area:  25.50 cm LA Vol (A2C):   139.0 ml 76.37 ml/m RA Volume:   89.10 ml  48.96 ml/m LA Vol (A4C):   137.0 ml 75.27 ml/m LA Biplane Vol: 140.0 ml 76.92 ml/m  AORTIC VALVE AV Area (Vmax):    1.94 cm AV Area (Vmean):   1.89 cm AV Area (VTI):     1.73 cm AV Vmax:           179.00 cm/s AV Vmean:          116.000 cm/s AV VTI:            0.367 m AV Peak Grad:      12.8 mmHg AV Mean Grad:      7.0 mmHg LVOT Vmax:         83.60 cm/s LVOT Vmean:        52.700 cm/s LVOT VTI:          0.153 m LVOT/AV VTI ratio: 0.42  AORTA Ao Root diam: 4.00 cm Ao Asc diam:  4.70 cm MITRAL VALVE               TRICUSPID VALVE MV Area (PHT): 3.95 cm    TR Peak grad:   35.8 mmHg MV Decel Time: 192 msec    TR Vmax:        299.00 cm/s MV E velocity: 96.80 cm/s                            SHUNTS                            Systemic VTI:  0.15 m                            Systemic Diam: 2.30 cm Fransico Him MD Electronically signed by Fransico Him MD Signature Date/Time: 10/07/2020/10:55:42 AM    Final     Scheduled Meds: . levothyroxine  12.5 mcg Intravenous Daily  . metoprolol tartrate  5 mg Intravenous Q6H  . sodium chloride flush  3 mL Intravenous Q12H   Continuous Infusions: . sodium chloride 250 mL (10/07/20 1013)  . azithromycin 500 mg (10/08/20 1102)  . cefTRIAXone (ROCEPHIN)  IV 2 g  (10/08/20 0951)  . dextrose 5 % and 0.9% NaCl 75 mL/hr at 10/08/20 0841  . famotidine (PEPCID) IV 20 mg (10/08/20 0847)  . heparin 1,000 Units/hr (10/08/20 1240)  . potassium chloride 10 mEq (10/08/20 1443)     LOS: 2 days   Marylu Lund, MD [facility] Pager On Amion  If 7PM-7AM, please contact night-coverage 10/08/2020, 2:50 PM

## 2020-10-08 NOTE — TOC Initial Note (Signed)
Transition of Care [facility]) - Initial/Assessment Note    Patient Details  Name: Robert Mcconnell MRN: [id number] Date of Birth: 1922-02-24  Transition of Care [facility]) CM/SW Contact:    Joanne Chars, LCSW Phone Number: 10/08/2020, 9:52 AM  Clinical Narrative:  CSW met with pt and wife in room.  Pt unable to participate in assessment, wife does participate but asks that CSW speak with her daughter Mardene [name] who is coordinating all care for pt.  Per wife, pt has been going to outpt PT and doing well prior to hospitalization.  Pt does have [place] aide in place through [facility].  Wife indicates she would like HH, choice document given.  Per epic, pt has had covid vaccine, no booster noted. PCP in place.  CSW spoke with pt daughter Mardene [name] who confirms the above.  She reports pt wife has dementia and daughter needs to be part of all planning.  Discussed HH recommendation, daughter would prefer to continue outpt PT as pt has good relationship with staff there.  [facility].  Pt has hospital bed, wheelchair.  Harrel Lemon lift is working per daughter and she has multiple batteries.  All equipment has come through [place].  Discussed order for palliative consult and daughter will wait to be contacted for [place] discussion.                   Expected Discharge Plan: [place] Barriers to Discharge: Continued Medical Work up,Other (must enter comment) (pending palliative GOC conversation)   Patient Goals and CMS Choice   CMS Medicare.gov Compare Post Acute Care list provided to:: Patient Represenative (must comment) Choice offered to / list presented to : Spouse  Expected Discharge Plan and Services Expected Discharge Plan: [place] In-house Referral: Clinical Social Work   Post Acute Care Choice: Resumption of Svcs/PTA Provider (outpt PT at [address] clinic) Living arrangements for the past 2 months: [place]                                       Prior Living Arrangements/Services Living arrangements for the past 2 months: Single Family Home Lives with:: Adult Children,Spouse Patient language and need for interpreter reviewed:: No        Need for Family Participation in Patient Care: Yes (Comment) Care giver support system in place?: Yes (comment) Current home services: Homehealth aide [facility]) Criminal Activity/Legal Involvement Pertinent to Current Situation/Hospitalization: No - Comment as needed  Activities of Daily Living      Permission Sought/Granted                  Emotional Assessment Appearance:: Appears stated age Attitude/Demeanor/Rapport: Unable to Assess Affect (typically observed): Unable to Assess Orientation: :  (not oriented) Alcohol / Substance Use: Not Applicable Psych Involvement: No (comment)  Admission diagnosis:  Hypoxia [R09.02] Acute respiratory failure with hypoxia and hypercapnia (HCC) [J96.01, J96.02] Community acquired pneumonia of right lung, unspecified part of lung [J18.9] Patient Active Problem List   Diagnosis Date Noted  . Acute respiratory failure with hypoxia and hypercapnia ([place]) 10/20/2020  . Aspiration pneumonia ([place]) 10/21/2020  . Hypothyroidism 10/24/2020  . Acute on chronic systolic CHF (congestive heart failure) ([place]) 10/21/2020  . Dilated cardiomyopathy ([place]) 03/05/2020  . History of CVA with residual deficit 03/05/2020  . Recurrent UTI 01/09/2020  . History of basal  cell cancer 01/09/2020  . Chronic respiratory failure with hypoxia ([place]) 11/07/2019  . Pressure injury of skin 11/05/2019  . Acute renal failure superimposed on stage 4 chronic kidney disease ([place]) 11/04/2019  . Right hemiparesis ([place]) 11/04/2019  . Atrial fibrillation, chronic ([place]) 11/04/2019  . Dysphagia 11/04/2019  . 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 CKD (chronic kidney disease) stage 4, GFR 15-29 ml/min (HCC) 09/29/2019  . Thrombocytopenia  ([place]) 09/29/2019  . Personal history of DVT (deep vein thrombosis) 09/09/2019  . History of ischemic stroke 09/09/2019  . CAD (coronary artery disease) 09/09/2019  . Chronic constipation 09/09/2019  . History of colorectal cancer 09/09/2019  . 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 Right arm weakness 09/09/2019  . Right leg weakness 09/09/2019  . Pseudobulbar affect 09/09/2019   PCP:  [name], MD Pharmacy:   [id number] [place] [address]Phone: [phone]: [phone] [place]Phone: [phone]: [phone] [facility] [address]Phone: [phone]: [phone]    Social Determinants of Health (SDOH) Interventions    Readmission Risk Interventions No flowsheet data found.

## 2020-10-08 NOTE — Consult Note (Signed)
Palliative Medicine Inpatient Consult Note  Reason for consult:  Goals of care "Goals of care, discussion regarding ?PEG/alternative means of nutrition wishes"  HPI:  Per intake H&P --> Robert Mcconnell 87 y.o.malewith medical history significant ofhypertension, atrial fibrillation, CVA with residual right-sided hemiparesis,CKD stage III,andBPHpresents with complaints of shortness of breath. Noted to be more lethargic recently. Pt was recommended to be on a thickened liquid diet post-prior CVA , however had been on regular diet over the past 16 mos. Family is reportedly already aware of high risk for aspiration prior to visit.  Palliative care has been asked to get involved to further aid in goals of care conversations in the setting of recurrent aspirational events and speech therapy deeming that Robert Mcconnell has an unsafe swallowing ability.  Clinical Assessment/Goals of Care:  *Please note that this is a verbal dictation therefore any spelling or grammatical errors are due to the "[employer] [employer]" system interpretation.  I have reviewed medical records including EPIC notes, labs and imaging, received report from bedside RN, assessed the patient who is on 10L facemask at the time of assessment.    I called patient's daughter, Mardene [name] to further discuss diagnosis prognosis, [place], EOL wishes, disposition and options.   I introduced Palliative Medicine as specialized medical care for people living with serious illness. It focuses on providing relief from the symptoms and stress of a serious illness. The goal is to improve quality of life for both the patient and the family.  Mardene [name] shares with me that her father is from [place] originally.  He moved here 16 months ago to live closer to her so that she could assume the role of being a caregiver.  He has been married to his wife, Mardene [name] for the past 60 years.  They share 2 daughters together 1 who is local and 1 who is located up in  [place].  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  He enjoys gardening for recreational activities.  He is a man of faith and practices within the [employer] denomination.  Prior to hospitalization he had been working with physical therapy and Occupational Therapy, he was making improvements and able to stand, pivot, and sit in the chair upright for prolonged periods of time.  He had been able to feed himself with his left hand.  From a swallowing perspective his family was aware of his poor swallowing abilities though he had since his stroke been able to gradually increase the textures of the diet he was consuming.  We reviewed that Robert Mcconnell has had ebbs and flows in terms of his physical state.  Discussed his most notable past medical history being his prior history of stroke.  Reviewed his present diagnoses which are those of aspirational events.  Discussed that speech therapy has seen Robert Mcconnell twice since hospitalization both times he has been identified as unsafe to swallow.  A detailed discussion was had today regarding advanced directives -these are completed patient's daughter will provide a copy to be scanned into [employer].   Concepts specific to code status, artifical feeding and hydration, continued IV antibiotics and rehospitalization was had.    Patient is an established DO NOT RESUSCITATE/DO NOT INTUBATE CODE STATUS.  His daughter shares that he would never want artificial nutrition via gastrostomy tube as this would be incongruent with his personal wishes  Reviewed that we will allow for the weekend to see if [place] can make improvements with current medical therapies though if he continues to deteriorate  we will further pursue comfort/hospice modalities of care.  I described hospice as a service for patients for have a life expectancy of < 6 months. It preserves dignity and quality at the end phases of life. The focus changes from curative to symptom relief. We reviewed that  hospice can be pursued anywhere. If this is the direction that [place] will need to travel in his daughter prefers for him to be in their home.  Discussed the importance of continued conversation with family and their  medical providers regarding overall plan of care and treatment options, ensuring decisions are within the context of the patients values and GOCs.  Decision Maker: Robert Mcconnell (daughter) [phone]  SUMMARY OF RECOMMENDATIONS   DNAR/DNI  Will place GOLD DNR on Chart  Family plan to bring Advance directives to be scanned into chart  Patient would not wish for a Gastrotomy tube per conversations with his daughter  Plan for time trial of treatments and to re-asses on Monday  [place] - Patient is Orthodox  Ongoing PMT Support  Code Status/Advance Care Planning: DNAR/DNI   Palliative Prophylaxis:   Oral Care, Mobility  Additional Recommendations (Limitations, Scope, Preferences):  Continue current scope of care   Psycho-social/Spiritual:   Desire for further Chaplaincy support: Yes  Additional Recommendations: Education on    Prognosis: Prognosis is worrisome in the setting of multiple aspirations - patient is at very high risk of acute decline.  Discharge Planning: Unclear presently  Vitals:   10/08/20 0450 10/08/20 0750  BP: (!) 173/83 (!) 175/92  Pulse: 80 72  Resp: 20 17  Temp: 98 F (36.7 C)   SpO2: 98% 96%    Intake/Output Summary (Last 24 hours) at 10/08/2020 1210 Last data filed at 10/08/2020 1043 Gross per 24 hour  Intake 250.07 ml  Output 2450 ml  Net -2199.93 ml   Last Weight  Most recent update: 10/07/2020  8:29 AM   Weight  70.8 kg (156 lb 1.4 oz)           Gen: Frail Elderly M in moderate distress HEENT: Dry mucous membranes CV: Regular rate and rhythm  PULM: clear to auscultation bilaterally  ABD: soft/nontender  EXT: (+) Trace pedal edema Neuro: Alert unable to phonate  PPS: 10%   This  conversation/these recommendations were discussed with patient primary care team, Dr. Wyline Copas  Time In: 1200 Time Out: 1310 Total Time: 70 Greater than 50%  of this time was spent counseling and coordinating care related to the above assessment and plan.  [place] Team Team Cell Phone: [phone] Please utilize secure chat with additional questions, if there is no response within 30 minutes please call the above phone number  Palliative Medicine Team providers are available by phone from 7am to 7pm daily and can be reached through the team cell phone.  Should this patient require assistance outside of these hours, please call the patient's attending physician.

## 2020-10-08 NOTE — Progress Notes (Signed)
ANTICOAGULATION CONSULT NOTE  Pharmacy Consult for heparin Indication: atrial fibrillation and stroke, DVT  No Known Allergies  Patient Measurements: Height: '5\' 7"'$  (170.2 cm) Weight: 70.8 kg (156 lb 1.4 oz) IBW/kg (Calculated) : 66.1 Heparin Dosing Weight: 70kg  Vital Signs: Temp: 97.8 F (36.6 C) (06/03 1930) Temp Source: Axillary (06/03 1930) BP: 173/96 (06/03 1930) Pulse Rate: 78 (06/03 1930)  Labs: Recent Labs    10/09/2020 1255 10/21/2020 1844 10/07/20 0211 10/08/20 0209 10/08/20 0816 10/08/20 2235  HGB 13.0  --  11.4*  --  12.9*  --   HCT 44.0  --  38.8*  --  43.2  --   PLT 86*  --  80*  --  89*  --   APTT  --   --   --   --   --  37*  LABPROT 16.5*  --   --   --   --   --   INR 1.3*  --   --   --   --   --   HEPARINUNFRC  --   --   --   --   --  0.73*  CREATININE 1.76*  --  1.65* 1.92*  --   --   TROPONINIHS 44* 54*  --   --   --   --     Estimated Creatinine Clearance: 21 mL/min (A) (by C-G formula based on SCr of 1.92 mg/dL (H)).   Assessment: 87 y.o. male with a hx of CVA/Afib/DVT, Eliquis on hold, for heparin.   Goal of Therapy:  Heparin level 0.3-0.5 units/ml  APTT 66-85 Monitor platelets by anticoagulation protocol: Yes   Plan:  Increase Heparin 1200 units/hr Follow-up am labs.    Phillis Knack, PharmD, BCPS  10/08/2020 11:18 PM

## 2020-10-09 LAB — COMPREHENSIVE METABOLIC PANEL
ALT: 11 U/L (ref 0–44)
AST: 19 U/L (ref 15–41)
Albumin: 2.1 g/dL — ABNORMAL LOW (ref 3.5–5.0)
Alkaline Phosphatase: 43 U/L (ref 38–126)
Anion gap: 6 (ref 5–15)
BUN: 39 mg/dL — ABNORMAL HIGH (ref 8–23)
CO2: 29 mmol/L (ref 22–32)
Calcium: 7.9 mg/dL — ABNORMAL LOW (ref 8.9–10.3)
Chloride: 112 mmol/L — ABNORMAL HIGH (ref 98–111)
Creatinine, Ser: 1.8 mg/dL — ABNORMAL HIGH (ref 0.61–1.24)
GFR, Estimated: 34 mL/min — ABNORMAL LOW (ref >60)
Glucose, Bld: 154 mg/dL — ABNORMAL HIGH (ref 70–99)
Potassium: 3.5 mmol/L (ref 3.5–5.1)
Sodium: 147 mmol/L — ABNORMAL HIGH (ref 135–145)
Total Bilirubin: 0.8 mg/dL (ref 0.3–1.2)
Total Protein: 5.7 g/dL — ABNORMAL LOW (ref 6.5–8.1)

## 2020-10-09 LAB — CBC
HCT: 38.1 % — ABNORMAL LOW (ref 39.0–52.0)
Hemoglobin: 11.4 g/dL — ABNORMAL LOW (ref 13.0–17.0)
MCH: 29.5 pg (ref 26.0–34.0)
MCHC: 29.9 g/dL — ABNORMAL LOW (ref 30.0–36.0)
MCV: 98.7 fL (ref 80.0–100.0)
Platelets: 83 10*3/uL — ABNORMAL LOW (ref 150–400)
RBC: 3.86 MIL/uL — ABNORMAL LOW (ref 4.22–5.81)
RDW: 16.6 % — ABNORMAL HIGH (ref 11.5–15.5)
WBC: 4.1 10*3/uL (ref 4.0–10.5)
nRBC: 0 % (ref 0.0–0.2)

## 2020-10-09 LAB — HEPARIN LEVEL (UNFRACTIONATED): Heparin Unfractionated: 0.63 IU/mL (ref 0.30–0.70)

## 2020-10-09 LAB — APTT
aPTT: 34 seconds (ref 24–36)
aPTT: >200 seconds (ref 24–36)
aPTT: >200 seconds (ref 24–36)

## 2020-10-09 MED ORDER — POTASSIUM CHLORIDE 10 MEQ/100ML IV SOLN
10.0000 meq | INTRAVENOUS | Status: AC
  Administered 2020-10-09 (×4): 10 meq via INTRAVENOUS
  Filled 2020-10-09 (×4): qty 100

## 2020-10-09 MED ORDER — HEPARIN (PORCINE) 25000 UT/250ML-% IV SOLN
1200.0000 [IU]/h | INTRAVENOUS | Status: DC
  Administered 2020-10-10: 1200 [IU]/h via INTRAVENOUS

## 2020-10-09 MED ORDER — METOPROLOL TARTRATE 5 MG/5ML IV SOLN
2.5000 mg | Freq: Four times a day (QID) | INTRAVENOUS | Status: DC
  Administered 2020-10-10 – 2020-10-12 (×10): 2.5 mg via INTRAVENOUS
  Filled 2020-10-09 (×11): qty 5

## 2020-10-09 MED ORDER — LORAZEPAM 2 MG/ML IJ SOLN
0.5000 mg | INTRAMUSCULAR | Status: DC | PRN
  Administered 2020-10-09: 0.5 mg via INTRAVENOUS
  Filled 2020-10-09: qty 1

## 2020-10-09 NOTE — Progress Notes (Signed)
ANTICOAGULATION CONSULT NOTE   Pharmacy Consult for heparin Indication: atrial fibrillation and stroke, DVT  No Known Allergies  Patient Measurements: Height: '5\' 7"'$  (170.2 cm) Weight: 70.8 kg (156 lb 1.4 oz) IBW/kg (Calculated) : 66.1 Heparin Dosing Weight: 70kg  Vital Signs: Temp: 97.6 F (36.4 C) (06/04 2035) Temp Source: Axillary (06/04 2035) BP: 159/62 (06/04 2035) Pulse Rate: 49 (06/04 1805)  Labs: Recent Labs    10/07/20 0211 10/08/20 [id number] 10/08/20 [id number] 10/08/20 2235 10/08/20 2235 10/09/20 0816 10/09/20 1737 10/09/20 2101  HGB 11.4*  --  12.9*  --   --  11.4*  --   --   HCT 38.8*  --  43.2  --   --  38.1*  --   --   PLT 80*  --  89*  --   --  83*  --   --   APTT  --   --   --  37*   < > 34 >200* >200*  HEPARINUNFRC  --   --   --  0.73*  --  0.63  --   --   CREATININE 1.65* 1.92*  --   --   --  1.80*  --   --    < > = values in this interval not displayed.    Estimated Creatinine Clearance: 22.4 mL/min (A) (by C-G formula based on SCr of 1.8 mg/dL (H)).   Medical History: Past Medical History:  Diagnosis Date  . Atrial fibrillation ([place])   . BPH (benign prostatic hyperplasia)   . CKD (chronic kidney disease)   . History of coronary angioplasty with insertion of stent   . Hypertension   . Severe sepsis ([place]) 11/04/2019  . Stroke [facility])    Assessment: Pt was admitted for resp distress. He has been on apixaban Last dose of on 6/1) for a hx of CVA/AF/DVT. He is currently NPO. He also has chronic thrombocytopenia. Ok to bridge with heparin for now per Dr. Wyline Copas.  APTT > 200 (confirmed on recheck)  Goal of Therapy:  Heparin level 0.3-0.5 units/ml  APTT 66-85 Monitor platelets by anticoagulation protocol: Yes   Plan:  -hold heparin for 1 hr and decrease to 1200 units/hr -Heparin level in 8 hours and daily wth CBC daily  Hildred Laser, PharmD Clinical Pharmacist **Pharmacist phone directory can now be found on [email or web address] ([email or web address]).  Listed under [place].

## 2020-10-09 NOTE — Progress Notes (Signed)
Rec'd order from pharmacy to hold Heparin x1hr, then decrease to 1200un/hr. APTT will be redrawn.

## 2020-10-09 NOTE — Progress Notes (Signed)
Pharmacy called r/t pts APTT being greater than 200, will continue with 2m/hr Hep and redraw APTT.

## 2020-10-09 NOTE — Progress Notes (Signed)
PROGRESS NOTE    Robert Mcconnell  [id number] DOB: November 23, 1921 DOA: 10/27/2020 PCP: [name], MD    Brief Narrative:  87 y.o. male with medical history significant of hypertension, atrial fibrillation, CVA with residual right-sided hemiparesis, CKD stage III, and BPH presents with complaints of shortness of breath. Noted to be more lethargic recently. Pt was recommended to be on a thickened liquid diet post-prior CVA , however had been on regular diet over the past 16 mos. Family is reportedly already aware of high risk for aspiration prior to visit.  Assessment & Plan:   Principal Problem:   Acute respiratory failure with hypoxia and hypercapnia (HCC) Active Problems:   CKD (chronic kidney disease) stage 4, GFR 15-29 ml/min (HCC)   Thrombocytopenia (HCC)   Atrial fibrillation, chronic (HCC)   Dysphagia   History of CVA with residual deficit   Aspiration pneumonia (HCC)   Hypothyroidism   Acute on chronic systolic CHF (congestive heart failure) (HCC)  Acute respiratory failure with hypoxia and hypercapnia  Sepsis secondary to pneumonia:  -Presented with tachycardia, tachypnea with O2 sats 79% on RA initially -Recently observed to aspirate on trial of PO intake -Currently continued on NRB, appropriate O2 sats -CXR reviewed with findings suggestive of airspace disease in the R mid-lower lung zones due to aspiration -Recently recommended by SLP for NPO status -Likely poor long term prognosis -for now, continue azithromycin and rocephin -cont to wean O2 as tolerated  Systolic congestive heart failure chronic, not in exacerbation:  -noted to have +1 pitting edema in the lower extremities and wet sounding lungs.  BNP elevated at 982.  Last EF was noted to be around 30-35% in 2021. -I&O's and daily weights  -2d echo reviewed. EF of 60-65% -Will recheck bmet in AM -Clinically dehydrated on exam, start basal IVF    Chronic atrial fibrillation on anticoagulation:   -Patient appears currently rate controlled and has been compliant with anticoagulation. -Pt currently NPO -Currently on IV lopressor and heparin gtt  Acute on CKD stage IV: -Cr trended up to 1.92 this AM -Clinically appears dehydrated on exam -cont on basal IVF as tolerated -Cr down to 1.8 today -Recheck bmet in AM  Hypothyroidism: Last TSH was 3.379 on 11/03/2019 -TSH 3.168 -Continue levothyroxine, but will change to equivalent dosed IV levothyroxine as pt is NPO  Thrombocytopenia: Chronic.   -Plts down to 80 on 6/2, up to 89 this AM -now on heparin gtt per above -cont to follow CBC  History of recurrent UTI -UA pending  BPH -Was continued on terazosin and Proscar, currently on hold secondary to NPO status  Hyperlipidemia -was continued on atorvastatin, currently on hold secondary to NPO status  Goals of Care -Pt's wishes are known to be DNR -Given recent events, have consulted Palliative Care -Appreciate recs. Family is aware of the difficult situation, primarily dysphagia. Established pt's wishes for no feeding tubes -Will cont to follow and manage over weekend with hopes that pt would improve to be able to tolerate PO safely -If pt does not improve by next week, would re-address goals  Anxiety -Notified by staff of increased anxiety. Of note, pt requires bedside sitter for pt pulling off O2 mask -Pt given one dose of lowest dose benzo with resultant lethargy this AM and afternoon -Discussed with family, pt noted to be sensitive to sedatives. Have d/c further sedatives, avoid benzo's moving forward  DVT prophylaxis: Eliquis Code Status: DNR Family Communication: Pt in room, family over phone  Status is:  Inpatient  Remains inpatient appropriate because:Inpatient level of care appropriate due to severity of illness   Dispo: The patient is from: Home              Anticipated d/c is to: Unknown at this time              Patient currently is not medically  stable to d/c.   Difficult to place patient No    Consultants:   Palliative Care  Procedures:     Antimicrobials: Anti-infectives (From admission, onward)   Start     Dose/Rate Route Frequency Ordered Stop   10/08/20 1600  vancomycin (VANCOREADY) IVPB 1250 mg/250 mL  Status:  Discontinued        1,250 mg 166.7 mL/hr over 90 Minutes Intravenous Every 48 hours 10/20/2020 1456 10/21/2020 1615   10/07/20 1400  ceFEPIme (MAXIPIME) 2 g in sodium chloride 0.9 % 100 mL IVPB  Status:  Discontinued        2 g 200 mL/hr over 30 Minutes Intravenous Every 24 hours 10/26/2020 1451 11/02/2020 1615   10/07/20 1000  cefTRIAXone (ROCEPHIN) 2 g in sodium chloride 0.9 % 100 mL IVPB        2 g 200 mL/hr over 30 Minutes Intravenous Every 24 hours 11/01/2020 1615 10/12/20 0959   10/07/20 1000  azithromycin (ZITHROMAX) 500 mg in sodium chloride 0.9 % 250 mL IVPB        500 mg 250 mL/hr over 60 Minutes Intravenous Every 24 hours 10/23/2020 1615 10/11/20 0959   10/11/2020 1300  vancomycin (VANCOREADY) IVPB 1500 mg/300 mL        1,500 mg 150 mL/hr over 120 Minutes Intravenous  Once 10/27/2020 1253 10/16/2020 1658   10/21/2020 1300  ceFEPIme (MAXIPIME) 2 g in sodium chloride 0.9 % 100 mL IVPB        2 g 200 mL/hr over 30 Minutes Intravenous  Once 10/24/2020 1253 10/28/2020 1448      Subjective: Asleep, responsive to painful stimuli  Objective: Vitals:   10/09/20 0742 10/09/20 1120 10/09/20 1230 10/09/20 1601  BP: (!) 162/69 117/69 (!) 150/51 133/88  Pulse: 61 73 (!) 47 (!) 150  Resp: '17 17 11 19  '$ Temp: 98.8 F (37.1 C) 98.1 F (36.7 C)  98.1 F (36.7 C)  TempSrc: Oral Oral  Oral  SpO2: 97% 98% 98%   Weight:      Height:        Intake/Output Summary (Last 24 hours) at 10/09/2020 1622 Last data filed at 10/09/2020 1352 Gross per 24 hour  Intake 0 ml  Output 1251 ml  Net -1251 ml   Filed Weights   10/07/20 0828  Weight: 70.8 kg    Examination: General exam: Asleep, moves eyebrows on painful stimuli, laying  in bed, in nad Respiratory system: Normal respiratory effort, no wheezing Cardiovascular system: regular rate, s1, s2 Gastrointestinal system: Soft, nondistended, positive BS Central nervous system: CN2-12 grossly intact, strength intact Extremities: Perfused, no clubbing Skin: Normal skin turgor, no notable skin lesions seen Psychiatry: Unable to asses    Data Reviewed: I have personally reviewed following labs and imaging studies  CBC: Recent Labs  Lab 10/09/2020 1250 10/09/2020 1255 10/07/20 0211 10/08/20 0816 10/09/20 0816  WBC  --  3.6* 3.6* 4.7 4.1  NEUTROABS  --  3.0 2.9  --   --   HGB 12.2* 13.0 11.4* 12.9* 11.4*  HCT 36.0* 44.0 38.8* 43.2 38.1*  MCV  --  101.4* 101.3* 99.1 98.7  PLT  --  86* 80* 89* 83*   Basic Metabolic Panel: Recent Labs  Lab 10/23/2020 1250 10/07/2020 1255 10/07/20 0211 10/08/20 0209 10/09/20 0816  NA 146* 143 144 147* 147*  K 3.6 3.8 3.5 3.0* 3.5  CL  --  109 107 109 112*  CO2  --  '29 28 31 29  '$ GLUCOSE  --  153* 127* 89 154*  BUN  --  32* 33* 40* 39*  CREATININE  --  1.76* 1.65* 1.92* 1.80*  CALCIUM  --  8.4* 8.0* 8.2* 7.9*  MG  --   --  2.1  --   --    GFR: Estimated Creatinine Clearance: 22.4 mL/min (A) (by C-G formula based on SCr of 1.8 mg/dL (H)). Liver Function Tests: Recent Labs  Lab 10/12/2020 1255 10/09/20 0816  AST 22 19  ALT 13 11  ALKPHOS 52 43  BILITOT 0.7 0.8  PROT 6.6 5.7*  ALBUMIN 2.7* 2.1*   No results for input(s): LIPASE, AMYLASE in the last 168 hours. No results for input(s): AMMONIA in the last 168 hours. Coagulation Profile: Recent Labs  Lab 10/16/2020 1255  INR 1.3*   Cardiac Enzymes: No results for input(s): CKTOTAL, CKMB, CKMBINDEX, TROPONINI in the last 168 hours. BNP (last 3 results) No results for input(s): PROBNP in the last 8760 hours. HbA1C: No results for input(s): HGBA1C in the last 72 hours. CBG: No results for input(s): GLUCAP in the last 168 hours. Lipid Profile: No results for input(s):  CHOL, HDL, LDLCALC, TRIG, CHOLHDL, LDLDIRECT in the last 72 hours. Thyroid Function Tests: Recent Labs    11/02/2020 1844  TSH 3.168   Anemia Panel: No results for input(s): VITAMINB12, FOLATE, FERRITIN, TIBC, IRON, RETICCTPCT in the last 72 hours. Sepsis Labs: Recent Labs  Lab 10/29/2020 1255 10/26/2020 1844  PROCALCITON  --  0.28  LATICACIDVEN 2.0* 1.1    Recent Results (from the past 240 hour(s))  Resp Panel by RT-PCR (Flu A&B, Covid) Nasopharyngeal Swab     Status: None   Collection Time: 10/25/2020 12:44 PM   Specimen: Nasopharyngeal Swab; Nasopharyngeal(NP) swabs in vial transport medium  Result Value Ref Range Status   SARS Coronavirus 2 by RT PCR NEGATIVE NEGATIVE Final    Comment: (NOTE) SARS-CoV-2 target nucleic acids are NOT DETECTED.  The SARS-CoV-2 RNA is generally detectable in upper respiratory specimens during the acute phase of infection. The lowest concentration of SARS-CoV-2 viral copies this assay can detect is 138 copies/mL. A negative result does not preclude SARS-Cov-2 infection and should not be used as the sole basis for treatment or other patient management decisions. A negative result may occur with  improper specimen collection/handling, submission of specimen other than nasopharyngeal swab, presence of viral mutation(s) within the areas targeted by this assay, and inadequate number of viral copies(<138 copies/mL). A negative result must be combined with clinical observations, patient history, and epidemiological information. The expected result is Negative.  Fact Sheet for Patients:  EntrepreneurPulse.com.au  Fact Sheet for Healthcare Providers:  IncredibleEmployment.be  This test is no t yet approved or cleared by the [place] FDA and  has been authorized for detection and/or diagnosis of SARS-CoV-2 by FDA under an Emergency Use Authorization (EUA). This EUA will remain  in effect (meaning this test can be  used) for the duration of the COVID-19 declaration under Section 564(b)(1) of the Act, 21 U.S.C.section 360bbb-3(b)(1), unless the authorization is terminated  or revoked sooner.       Influenza A by  PCR NEGATIVE NEGATIVE Final   Influenza B by PCR NEGATIVE NEGATIVE Final    Comment: (NOTE) The Xpert Xpress SARS-CoV-2/FLU/RSV plus assay is intended as an aid in the diagnosis of influenza from Nasopharyngeal swab specimens and should not be used as a sole basis for treatment. Nasal washings and aspirates are unacceptable for Xpert Xpress SARS-CoV-2/FLU/RSV testing.  Fact Sheet for Patients: EntrepreneurPulse.com.au  Fact Sheet for Healthcare Providers: IncredibleEmployment.be  This test is not yet approved or cleared by the [place] FDA and has been authorized for detection and/or diagnosis of SARS-CoV-2 by FDA under an Emergency Use Authorization (EUA). This EUA will remain in effect (meaning this test can be used) for the duration of the COVID-19 declaration under Section 564(b)(1) of the Act, 21 U.S.C. section 360bbb-3(b)(1), unless the authorization is terminated or revoked.  Performed at [facility], [place] [address] [place], [place] [address]   Culture, blood (routine x 2)     Status: None (Preliminary result)   Collection Time: 10/27/2020  2:13 PM   Specimen: BLOOD RIGHT FOREARM  Result Value Ref Range Status   Specimen Description BLOOD RIGHT FOREARM  Final   Special Requests   Final    BOTTLES DRAWN AEROBIC AND ANAEROBIC Blood Culture results may not be optimal due to an inadequate volume of blood received in culture bottles   Culture   Final    NO GROWTH 3 DAYS Performed at [facility], [place] [address] [place], [place] [address]    Report Status PENDING  Incomplete  Culture, blood (routine x 2)     Status: None (Preliminary result)   Collection Time: 11/01/2020  6:44 PM   Specimen: BLOOD  Result Value Ref Range  Status   Specimen Description BLOOD SITE NOT SPECIFIED  Final   Special Requests   Final    BOTTLES DRAWN AEROBIC AND ANAEROBIC Blood Culture adequate volume   Culture   Final    NO GROWTH 2 DAYS Performed at [facility], [address] [place], [place] [address]    Report Status PENDING  Incomplete     Radiology Studies: No results found.  Scheduled Meds: . levothyroxine  12.5 mcg Intravenous Daily  . metoprolol tartrate  2.5 mg Intravenous Q6H  . sodium chloride flush  3 mL Intravenous Q12H   Continuous Infusions: . sodium chloride 250 mL (10/07/20 1013)  . azithromycin 500 mg (10/09/20 1331)  . cefTRIAXone (ROCEPHIN)  IV 2 g (10/09/20 0900)  . dextrose 5 % and 0.9% NaCl 75 mL/hr at 10/09/20 1224  . famotidine (PEPCID) IV 20 mg (10/09/20 1226)  . heparin 1,200 Units/hr (10/09/20 0945)  . potassium chloride 10 mEq (10/09/20 1616)     LOS: 3 days   Marylu Lund, MD [facility] Pager On Amion  If 7PM-7AM, please contact night-coverage 10/09/2020, 4:22 PM

## 2020-10-09 NOTE — Progress Notes (Addendum)
ANTICOAGULATION CONSULT NOTE - Initial Consult  Pharmacy Consult for heparin Indication: atrial fibrillation and stroke, DVT  No Known Allergies  Patient Measurements: Height: '5\' 7"'$  (170.2 cm) Weight: 70.8 kg (156 lb 1.4 oz) IBW/kg (Calculated) : 66.1 Heparin Dosing Weight: 70kg  Vital Signs: Temp: 98.8 F (37.1 C) (06/04 0742) Temp Source: Oral (06/04 0742) BP: 162/69 (06/04 0742) Pulse Rate: 61 (06/04 0742)  Labs: Recent Labs    10/22/2020 1255 11/04/2020 1844 10/07/20 0211 10/08/20 0209 10/08/20 0816 10/08/20 2235 10/09/20 0816  HGB 13.0  --  11.4*  --  12.9*  --  11.4*  HCT 44.0  --  38.8*  --  43.2  --  38.1*  PLT 86*  --  80*  --  89*  --  83*  APTT  --   --   --   --   --  37*  --   LABPROT 16.5*  --   --   --   --   --   --   INR 1.3*  --   --   --   --   --   --   HEPARINUNFRC  --   --   --   --   --  0.73* 0.63  CREATININE 1.76*  --  1.65* 1.92*  --   --   --   TROPONINIHS 44* 54*  --   --   --   --   --     Estimated Creatinine Clearance: 21 mL/min (A) (by C-G formula based on SCr of 1.92 mg/dL (H)).   Medical History: Past Medical History:  Diagnosis Date  . Atrial fibrillation ([place])   . BPH (benign prostatic hyperplasia)   . CKD (chronic kidney disease)   . History of coronary angioplasty with insertion of stent   . Hypertension   . Severe sepsis ([place]) 11/04/2019  . Stroke [facility])    Assessment: Pt was admitted for resp distress. He has been on apixaban for a hx of CVA/AF/DVT. He is currently NPO. He also has chronic thrombocytopenia. Ok to bridge with heparin for now per Dr. Wyline Copas. We will use a lower goal due to his thrombocytopenia issue. We will follow up with PTT and HL to see if they correlate. Per RN, patient did have bleeding from site of infusion this morning and the heparin infusion was paused for ~10 minutes.    Last dose of apixaban on 6/1 Scr 1.8  APTT 34 (subtherapeutic)  HL 0.63 (downtrending but still likely falsely elevated)  Goal  of Therapy:  Heparin level 0.3-0.5 units/ml  APTT 66-85 Monitor platelets by anticoagulation protocol: Yes   Plan:  Increase heparin 1400 units/hr 8 hr PTT/HL  Daily aPTT, HL, CBC, f/u s/sx bleeding F/u with resuming apixaban vs alternative therapies   Wilson Singer, PharmD PGY1 Pharmacy Resident 10/09/2020 9:04 AM

## 2020-10-09 NOTE — Progress Notes (Signed)
SLP Cancellation Note  Patient Details Name: Robert Mcconnell MRN: [id number] DOB: [birth year]   Cancelled treatment:       Reason Eval/Treat Not Completed: Patient's level of consciousness;Other (comment) (pt with significant lethargy, received ativan this am due to anxiousness); will continue to follow for goals of care   [place], [place]   10/09/2020, 12:20 PM

## 2020-10-09 NOTE — Progress Notes (Signed)
Pt daughter request that pt not be given tranquillizers. Explained medication to daughter nurse and rationale. Daughter states that when pt had a stroke was given medication that knocked him out for days and would not want that to happen. Informed daughter would notate chart with request.

## 2020-10-09 NOTE — Progress Notes (Signed)
Communication sent to MD d/t pt appearing anxious this am.

## 2020-10-10 LAB — BASIC METABOLIC PANEL
Anion gap: 6 (ref 5–15)
BUN: 37 mg/dL — ABNORMAL HIGH (ref 8–23)
CO2: 23 mmol/L (ref 22–32)
Calcium: 7.6 mg/dL — ABNORMAL LOW (ref 8.9–10.3)
Chloride: 118 mmol/L — ABNORMAL HIGH (ref 98–111)
Creatinine, Ser: 1.56 mg/dL — ABNORMAL HIGH (ref 0.61–1.24)
GFR, Estimated: 40 mL/min — ABNORMAL LOW (ref >60)
Glucose, Bld: 132 mg/dL — ABNORMAL HIGH (ref 70–99)
Potassium: 4.1 mmol/L (ref 3.5–5.1)
Sodium: 147 mmol/L — ABNORMAL HIGH (ref 135–145)

## 2020-10-10 LAB — APTT
aPTT: >200 seconds (ref 24–36)
aPTT: >200 seconds (ref 24–36)

## 2020-10-10 LAB — HEPARIN LEVEL (UNFRACTIONATED): Heparin Unfractionated: >1.1 IU/mL — ABNORMAL HIGH (ref 0.30–0.70)

## 2020-10-10 MED ORDER — HEPARIN (PORCINE) 25000 UT/250ML-% IV SOLN
950.0000 [IU]/h | INTRAVENOUS | Status: DC
  Administered 2020-10-10 (×2): 950 [IU]/h via INTRAVENOUS
  Filled 2020-10-10: qty 250

## 2020-10-10 NOTE — Progress Notes (Signed)
  Speech Language Pathology Treatment: Dysphagia  Patient Details Name: Robert Mcconnell MRN: [id number] DOB: 1921/10/16 Today's Date: 10/10/2020 Time: [id number] SLP Time Calculation (min) (ACUTE ONLY): 20 min  Assessment / Plan / Recommendation Clinical Impression  Patient seen for PO trials with wife and daughter present in room. Per daughter, most recently at home, patient had decent appetite and would eat small meals, supplement Ensure shakes when he did not eat as much. She also stated that at recent baseline he would have expectoration of phlegm/mucous daily. During this session, patient was alert however has significant difficulty following commands versus difficulty with oral motor movements during PO trials. Mouth with open posture and patient would close mouth only with maximal cues from SLP and daughter. SLP provided oral care and his entire oral mucosa was dry. He was unable to manipulate small ice chip and it remained in front of mouth behind bottom teeth until melted. Patient was able to demonstrate swallow initiation with teaspoon and controlled cup sips of thin liquids(water) however swallows were inconsistent and very delayed. Weak cough observed after cup sip of water, resulting in patient moving very thick and sticky secretion that was stuck to back of tongue. SLP was able to remove majority of this with toothette sponge and oral suction. Throughout session (prior to, during, after PO's) patient with audible suspected pharyngeal secretions that he was unable to effectively expectorate. SLP spoke with patient, daughter, wife regarding recommendation for continued NPO status and need for alternative temporary nutrition. Family is in agreement for short term non-oral nutrition but continue to be against long term PEG tube. Patient is not ready for objective swallow study at this point but SLP to continue to follow patient for readiness for this.   HPI HPI: Pt is a 87 y.o. male who presented  with complaints of shortness of breath. Per MD's note on 6/1, nursing had to "suction a good amount of food out of the patient's mouth and airway as the patient does not seem to be able to swallow food properly." CXR 6/1: Airspace disease in the right mid and lower lung zones could be due to pneumonia or aspiration. PMH: hypertension, atrial fibrillation, CVA with residual right-sided hemiparesis, CKD stage III, and BPH. BSE in June 2021 dysphagia 2 and thin liquids recommended at that time.      SLP Plan  Continue with current plan of care       Recommendations  Medication Administration: Via alternative means                Oral Care Recommendations: Oral care QID;Staff/trained caregiver to provide oral care Follow up Recommendations: Other (comment) (TBD) SLP Visit Diagnosis: Dysphagia, unspecified (R13.10) Plan: Continue with current plan of care       [place], MA, [place] [facility]

## 2020-10-10 NOTE — Progress Notes (Addendum)
ANTICOAGULATION CONSULT NOTE   Pharmacy Consult for heparin Indication: atrial fibrillation and stroke, DVT  No Known Allergies  Patient Measurements: Height: '5\' 7"'$  (170.2 cm) Weight: 70.8 kg (156 lb 1.4 oz) IBW/kg (Calculated) : 66.1 Heparin Dosing Weight: 70kg  Vital Signs: Temp: 97.7 F (36.5 C) (06/05 0500) Temp Source: Axillary (06/05 0500) BP: 146/96 (06/04 2339) Pulse Rate: 66 (06/04 2339)  Labs: Recent Labs    10/08/20 0209 10/08/20 0816 10/08/20 2235 10/08/20 2235 10/09/20 0816 10/09/20 1737 10/09/20 2101 10/10/20 0429 10/10/20 0810  HGB  --  12.9*  --   --  11.4*  --   --   --   --   HCT  --  43.2  --   --  38.1*  --   --   --   --   PLT  --  89*  --   --  83*  --   --   --   --   APTT  --   --  37*   < > 34 >200* >200*  --  >200*  HEPARINUNFRC  --   --  0.73*  --  0.63  --   --   --  >1.10*  CREATININE 1.92*  --   --   --  1.80*  --   --  1.56*  --    < > = values in this interval not displayed.    Estimated Creatinine Clearance: 25.9 mL/min (A) (by C-G formula based on SCr of 1.56 mg/dL (H)).   Medical History: Past Medical History:  Diagnosis Date  . Atrial fibrillation ([place])   . BPH (benign prostatic hyperplasia)   . CKD (chronic kidney disease)   . History of coronary angioplasty with insertion of stent   . Hypertension   . Severe sepsis ([place]) 11/04/2019  . Stroke [facility])    Assessment: Pt was admitted for resp distress. He has been on apixaban Last dose of on 6/1) for a hx of CVA/AF/DVT. He is currently NPO. He also has chronic thrombocytopenia. Ok to bridge with heparin for now per Dr. Wyline Copas.   Confirmed with phlebotomy that heparin labs were not drawn off of heparin line - per phleb, drawn off of opposite hand from heparin infusion.   HL persists >1.1  APTT persists > 200   Goal of Therapy:  Heparin level 0.3-0.5 units/ml  APTT 66-85 Monitor platelets by anticoagulation protocol: Yes   Plan:  -Hold heparin infusion for 1 hr and  decrease to 950 units/hr -RN aware of plan -Heparin level in 8 hours and daily wth CBC daily -f/u s/sx bleeding  Wilson Singer, PharmD PGY1 Pharmacy Resident 10/10/2020 9:22 AM

## 2020-10-10 NOTE — Progress Notes (Signed)
ANTICOAGULATION CONSULT NOTE   Pharmacy Consult for heparin Indication: atrial fibrillation and stroke, DVT  No Known Allergies  Patient Measurements: Height: '5\' 7"'$  (170.2 cm) Weight: 70.8 kg (156 lb 1.4 oz) IBW/kg (Calculated) : 66.1 Heparin Dosing Weight: 70kg  Vital Signs: Temp: 97.6 F (36.4 C) (06/05 1311) Temp Source: Axillary (06/05 1311) BP: 137/62 (06/05 1311) Pulse Rate: 71 (06/05 1311)  Labs: Recent Labs    10/08/20 0209 10/08/20 0816 10/08/20 2235 10/08/20 2235 10/09/20 0816 10/09/20 1737 10/09/20 2101 10/10/20 0429 10/10/20 0810 10/10/20 1746  HGB  --  12.9*  --   --  11.4*  --   --   --   --   --   HCT  --  43.2  --   --  38.1*  --   --   --   --   --   PLT  --  89*  --   --  83*  --   --   --   --   --   APTT  --   --  37*   < > 34   < > >200*  --  >200* >200*  HEPARINUNFRC  --   --  0.73*  --  0.63  --   --   --  >1.10*  --   CREATININE 1.92*  --   --   --  1.80*  --   --  1.56*  --   --    < > = values in this interval not displayed.    Estimated Creatinine Clearance: 25.9 mL/min (A) (by C-G formula based on SCr of 1.56 mg/dL (H)).   Medical History: Past Medical History:  Diagnosis Date  . Atrial fibrillation ([place])   . BPH (benign prostatic hyperplasia)   . CKD (chronic kidney disease)   . History of coronary angioplasty with insertion of stent   . Hypertension   . Severe sepsis ([place]) 11/04/2019  . Stroke [facility])    Assessment: Pt was admitted for resp distress. He has been on apixaban Last dose of on 6/1) for a hx of CVA/AF/DVT. He is currently NPO. He also has chronic thrombocytopenia. Ok to bridge with heparin for now per Dr. Wyline Copas.  -APTT persists > 200   Goal of Therapy:  Heparin level 0.3-0.5 units/ml  APTT 66-85 Monitor platelets by anticoagulation protocol: Yes   Plan:  -stop heparin -Check aPTT in ~ 90 minutes and may restart based on that value  Hildred Laser, PharmD Clinical Pharmacist **Pharmacist phone directory can now  be found on [email or web address] ([email or web address]).  Listed under [place].

## 2020-10-10 NOTE — Progress Notes (Signed)
PROGRESS NOTE    Robert Mcconnell  [id number] DOB: 05/21/1921 DOA: 10/24/2020 PCP: [name], MD    Brief Narrative:  87 y.o. male with medical history significant of hypertension, atrial fibrillation, CVA with residual right-sided hemiparesis, CKD stage III, and BPH presents with complaints of shortness of breath. Noted to be more lethargic recently. Pt was recommended to be on a thickened liquid diet post-prior CVA , however had been on regular diet over the past 16 mos. Family is reportedly already aware of high risk for aspiration prior to visit.  Assessment & Plan:   Principal Problem:   Acute respiratory failure with hypoxia and hypercapnia (HCC) Active Problems:   CKD (chronic kidney disease) stage 4, GFR 15-29 ml/min (HCC)   Thrombocytopenia (HCC)   Atrial fibrillation, chronic (HCC)   Dysphagia   History of CVA with residual deficit   Aspiration pneumonia (HCC)   Hypothyroidism   Acute on chronic systolic CHF (congestive heart failure) (HCC)  Acute respiratory failure with hypoxia and hypercapnia  Sepsis secondary to pneumonia:  -Presented with tachycardia, tachypnea with O2 sats 79% on RA initially -Recently observed to aspirate on trial of PO intake -Currently continued on NRB, appropriate O2 sats -CXR reviewed with findings suggestive of airspace disease in the R mid-lower lung zones due to aspiration -SLP continues to recommend NPO, not candidate for MBS -Likely poor long term prognosis -for now, continue azithromycin and rocephin -cont to wean O2 as tolerated -Would f/u with Pallliative Care  Systolic congestive heart failure chronic, not in exacerbation:  -noted to have +1 pitting edema in the lower extremities and wet sounding lungs.  BNP elevated at 982.  Last EF was noted to be around 30-35% in 2021. -I&O's and daily weights  -2d echo reviewed. EF of 60-65% -Recheck bmet in AM -Clinically dehydrated on exam, start basal IVF    Chronic atrial  fibrillation on anticoagulation:  -Patient appears currently rate controlled and has been compliant with anticoagulation. -Pt currently NPO -Currently on IV lopressor and heparin gtt  Acute on CKD stage IV: -Cr peaked to 1.92 -Given NPO status, cont on basal IVF as tolerated  -Cr down to 1.5 today. Will decrease IVF to 50cc/hr -Recheck bmet in AM  Hypothyroidism: Last TSH was 3.379 on 11/03/2019 -TSH 3.168 -Continue levothyroxine, continue wtih equivalent dosed IV levothyroxine as pt is NPO  Thrombocytopenia: Chronic.   -Plts down to 80 on 6/2, up to 89 this AM -now on heparin gtt per above -follow CBC trends  History of recurrent UTI -UA noted to be pending  BPH -Was continued on terazosin and Proscar, currently on hold secondary to NPO status  Hyperlipidemia -was continued on atorvastatin, currently on hold secondary to NPO status  Goals of Care -Pt's wishes are known to be DNR -Given recent events, have consulted Palliative Care -Appreciate recs. Family is aware of the difficult situation, primarily dysphagia. Established pt's wishes for no feeding tubes -Continuing to follow if pt regains clearance for PO -If pt does not improve, would need to re-address goals  Anxiety --Discussed with family, pt noted to be sensitive to sedatives. Have d/c further sedatives, avoid benzo's moving forward -Seems stable at present  DVT prophylaxis: Eliquis Code Status: DNR Family Communication: Pt in room, family is present at bedside  Status is: Inpatient  Remains inpatient appropriate because:Inpatient level of care appropriate due to severity of illness   Dispo: The patient is from: Home  Anticipated d/c is to: Unknown at this time              Patient currently is not medically stable to d/c.   Difficult to place patient No    Consultants:   Palliative Care  Procedures:     Antimicrobials: Anti-infectives (From admission, onward)   Start      Dose/Rate Route Frequency Ordered Stop   10/08/20 1600  vancomycin (VANCOREADY) IVPB 1250 mg/250 mL  Status:  Discontinued        1,250 mg 166.7 mL/hr over 90 Minutes Intravenous Every 48 hours 10/16/2020 1456 10/14/2020 1615   10/07/20 1400  ceFEPIme (MAXIPIME) 2 g in sodium chloride 0.9 % 100 mL IVPB  Status:  Discontinued        2 g 200 mL/hr over 30 Minutes Intravenous Every 24 hours 10/14/2020 1451 11/02/2020 1615   10/07/20 1000  cefTRIAXone (ROCEPHIN) 2 g in sodium chloride 0.9 % 100 mL IVPB        2 g 200 mL/hr over 30 Minutes Intravenous Every 24 hours 10/12/2020 1615 10/12/20 0959   10/07/20 1000  azithromycin (ZITHROMAX) 500 mg in sodium chloride 0.9 % 250 mL IVPB        500 mg 250 mL/hr over 60 Minutes Intravenous Every 24 hours 10/10/2020 1615 10/10/20 1150   10/10/2020 1300  vancomycin (VANCOREADY) IVPB 1500 mg/300 mL        1,500 mg 150 mL/hr over 120 Minutes Intravenous  Once 10/11/2020 1253 10/28/2020 1658   11/01/2020 1300  ceFEPIme (MAXIPIME) 2 g in sodium chloride 0.9 % 100 mL IVPB        2 g 200 mL/hr over 30 Minutes Intravenous  Once 10/24/2020 1253 11/01/2020 1448      Subjective: Unable to assess given mentation  Objective: Vitals:   10/09/20 2035 10/09/20 2339 10/10/20 0500 10/10/20 1311  BP: (!) 159/63 (!) 146/96  137/62  Pulse:  66  71  Resp: 12   14  Temp: 97.6 F (36.4 C) 97.9 F (36.6 C) 97.7 F (36.5 C) 97.6 F (36.4 C)  TempSrc: Axillary Axillary Axillary Axillary  SpO2:    99%  Weight:      Height:        Intake/Output Summary (Last 24 hours) at 10/10/2020 1519 Last data filed at 10/10/2020 0636 Gross per 24 hour  Intake 3774.31 ml  Output 500 ml  Net 3274.31 ml   Filed Weights   10/07/20 0828  Weight: 70.8 kg    Examination: General exam: Not conversant, in no acute distress Respiratory system: normal chest rise, clear, no audible wheezing Cardiovascular system: regular rhythm, s1-s2 Gastrointestinal system: Nondistended, nontender, pos BS Central  nervous system: No seizures, no tremors Extremities: No cyanosis, no joint deformities Skin: No rashes, no pallor Psychiatry: unable to assess given acute illness   Data Reviewed: I have personally reviewed following labs and imaging studies  CBC: Recent Labs  Lab 10/08/2020 1250 11/02/2020 1255 10/07/20 0211 10/08/20 0816 10/09/20 0816  WBC  --  3.6* 3.6* 4.7 4.1  NEUTROABS  --  3.0 2.9  --   --   HGB 12.2* 13.0 11.4* 12.9* 11.4*  HCT 36.0* 44.0 38.8* 43.2 38.1*  MCV  --  101.4* 101.3* 99.1 98.7  PLT  --  86* 80* 89* 83*   Basic Metabolic Panel: Recent Labs  Lab 10/19/2020 1255 10/07/20 0211 10/08/20 0209 10/09/20 0816 10/10/20 0429  NA 143 144 147* 147* 147*  K 3.8 3.5 3.0* 3.5  4.1  CL 109 107 109 112* 118*  CO2 '29 28 31 29 23  '$ GLUCOSE 153* 127* 89 154* 132*  BUN 32* 33* 40* 39* 37*  CREATININE 1.76* 1.65* 1.92* 1.80* 1.56*  CALCIUM 8.4* 8.0* 8.2* 7.9* 7.6*  MG  --  2.1  --   --   --    GFR: Estimated Creatinine Clearance: 25.9 mL/min (A) (by C-G formula based on SCr of 1.56 mg/dL (H)). Liver Function Tests: Recent Labs  Lab 10/27/2020 1255 10/09/20 0816  AST 22 19  ALT 13 11  ALKPHOS 52 43  BILITOT 0.7 0.8  PROT 6.6 5.7*  ALBUMIN 2.7* 2.1*   No results for input(s): LIPASE, AMYLASE in the last 168 hours. No results for input(s): AMMONIA in the last 168 hours. Coagulation Profile: Recent Labs  Lab 10/14/2020 1255  INR 1.3*   Cardiac Enzymes: No results for input(s): CKTOTAL, CKMB, CKMBINDEX, TROPONINI in the last 168 hours. BNP (last 3 results) No results for input(s): PROBNP in the last 8760 hours. HbA1C: No results for input(s): HGBA1C in the last 72 hours. CBG: No results for input(s): GLUCAP in the last 168 hours. Lipid Profile: No results for input(s): CHOL, HDL, LDLCALC, TRIG, CHOLHDL, LDLDIRECT in the last 72 hours. Thyroid Function Tests: No results for input(s): TSH, T4TOTAL, FREET4, T3FREE, THYROIDAB in the last 72 hours. Anemia Panel: No  results for input(s): VITAMINB12, FOLATE, FERRITIN, TIBC, IRON, RETICCTPCT in the last 72 hours. Sepsis Labs: Recent Labs  Lab 10/08/2020 1255 11/01/2020 1844  PROCALCITON  --  0.28  LATICACIDVEN 2.0* 1.1    Recent Results (from the past 240 hour(s))  Resp Panel by RT-PCR (Flu A&B, Covid) Nasopharyngeal Swab     Status: None   Collection Time: 11/02/2020 12:44 PM   Specimen: Nasopharyngeal Swab; Nasopharyngeal(NP) swabs in vial transport medium  Result Value Ref Range Status   SARS Coronavirus 2 by RT PCR NEGATIVE NEGATIVE Final    Comment: (NOTE) SARS-CoV-2 target nucleic acids are NOT DETECTED.  The SARS-CoV-2 RNA is generally detectable in upper respiratory specimens during the acute phase of infection. The lowest concentration of SARS-CoV-2 viral copies this assay can detect is 138 copies/mL. A negative result does not preclude SARS-Cov-2 infection and should not be used as the sole basis for treatment or other patient management decisions. A negative result may occur with  improper specimen collection/handling, submission of specimen other than nasopharyngeal swab, presence of viral mutation(s) within the areas targeted by this assay, and inadequate number of viral copies(<138 copies/mL). A negative result must be combined with clinical observations, patient history, and epidemiological information. The expected result is Negative.  Fact Sheet for Patients:  EntrepreneurPulse.com.au  Fact Sheet for Healthcare Providers:  IncredibleEmployment.be  This test is no t yet approved or cleared by the [place] FDA and  has been authorized for detection and/or diagnosis of SARS-CoV-2 by FDA under an Emergency Use Authorization (EUA). This EUA will remain  in effect (meaning this test can be used) for the duration of the COVID-19 declaration under Section 564(b)(1) of the Act, 21 U.S.C.section 360bbb-3(b)(1), unless the authorization is  terminated  or revoked sooner.       Influenza A by PCR NEGATIVE NEGATIVE Final   Influenza B by PCR NEGATIVE NEGATIVE Final    Comment: (NOTE) The Xpert Xpress SARS-CoV-2/FLU/RSV plus assay is intended as an aid in the diagnosis of influenza from Nasopharyngeal swab specimens and should not be used as a sole basis for treatment. Nasal  washings and aspirates are unacceptable for Xpert Xpress SARS-CoV-2/FLU/RSV testing.  Fact Sheet for Patients: EntrepreneurPulse.com.au  Fact Sheet for Healthcare Providers: IncredibleEmployment.be  This test is not yet approved or cleared by the [place] FDA and has been authorized for detection and/or diagnosis of SARS-CoV-2 by FDA under an Emergency Use Authorization (EUA). This EUA will remain in effect (meaning this test can be used) for the duration of the COVID-19 declaration under Section 564(b)(1) of the Act, 21 U.S.C. section 360bbb-3(b)(1), unless the authorization is terminated or revoked.  Performed at [facility], [place] [address], [place], [place] [address]   Culture, blood (routine x 2)     Status: None (Preliminary result)   Collection Time: 10/18/2020  2:13 PM   Specimen: BLOOD RIGHT FOREARM  Result Value Ref Range Status   Specimen Description BLOOD RIGHT FOREARM  Final   Special Requests   Final    BOTTLES DRAWN AEROBIC AND ANAEROBIC Blood Culture results may not be optimal due to an inadequate volume of blood received in culture bottles   Culture   Final    NO GROWTH 3 DAYS Performed at [facility], [place] [address] [place], [place] [address]    Report Status PENDING  Incomplete  Culture, blood (routine x 2)     Status: None (Preliminary result)   Collection Time: 10/21/2020  6:44 PM   Specimen: BLOOD  Result Value Ref Range Status   Specimen Description BLOOD SITE NOT SPECIFIED  Final   Special Requests   Final    BOTTLES DRAWN AEROBIC AND ANAEROBIC Blood Culture  adequate volume   Culture   Final    NO GROWTH 2 DAYS Performed at [facility], [address], [place], [place] [address]    Report Status PENDING  Incomplete     Radiology Studies: No results found.  Scheduled Meds: . levothyroxine  12.5 mcg Intravenous Daily  . metoprolol tartrate  2.5 mg Intravenous Q6H  . sodium chloride flush  3 mL Intravenous Q12H   Continuous Infusions: . sodium chloride Stopped (10/07/20 2036)  . cefTRIAXone (ROCEPHIN)  IV 2 g (10/10/20 0913)  . dextrose 5 % and 0.9% NaCl 75 mL/hr at 10/10/20 0634  . famotidine (PEPCID) IV 20 mg (10/10/20 1008)  . heparin 950 Units/hr (10/10/20 1222)     LOS: 4 days   Marylu Lund, MD [facility] Pager On Amion  If 7PM-7AM, please contact night-coverage 10/10/2020, 3:19 PM

## 2020-10-11 ENCOUNTER — Inpatient Hospital Stay (HOSPITAL_COMMUNITY): Payer: No Typology Code available for payment source

## 2020-10-11 LAB — BASIC METABOLIC PANEL
Anion gap: 5 (ref 5–15)
BUN: 29 mg/dL — ABNORMAL HIGH (ref 8–23)
CO2: 28 mmol/L (ref 22–32)
Calcium: 8.1 mg/dL — ABNORMAL LOW (ref 8.9–10.3)
Chloride: 119 mmol/L — ABNORMAL HIGH (ref 98–111)
Creatinine, Ser: 1.62 mg/dL — ABNORMAL HIGH (ref 0.61–1.24)
GFR, Estimated: 39 mL/min — ABNORMAL LOW (ref >60)
Glucose, Bld: 139 mg/dL — ABNORMAL HIGH (ref 70–99)
Potassium: 3.6 mmol/L (ref 3.5–5.1)
Sodium: 152 mmol/L — ABNORMAL HIGH (ref 135–145)

## 2020-10-11 LAB — CBC
HCT: 39.7 % (ref 39.0–52.0)
Hemoglobin: 11.3 g/dL — ABNORMAL LOW (ref 13.0–17.0)
MCH: 29.1 pg (ref 26.0–34.0)
MCHC: 28.5 g/dL — ABNORMAL LOW (ref 30.0–36.0)
MCV: 102.3 fL — ABNORMAL HIGH (ref 80.0–100.0)
Platelets: 91 10*3/uL — ABNORMAL LOW (ref 150–400)
RBC: 3.88 MIL/uL — ABNORMAL LOW (ref 4.22–5.81)
RDW: 16.7 % — ABNORMAL HIGH (ref 11.5–15.5)
WBC: 5.4 10*3/uL (ref 4.0–10.5)
nRBC: 0 % (ref 0.0–0.2)

## 2020-10-11 LAB — GLUCOSE, CAPILLARY: Glucose-Capillary: 135 mg/dL — ABNORMAL HIGH (ref 70–99)

## 2020-10-11 LAB — CULTURE, BLOOD (ROUTINE X 2): Culture: NO GROWTH

## 2020-10-11 LAB — HEPARIN LEVEL (UNFRACTIONATED)
Heparin Unfractionated: 0.57 IU/mL (ref 0.30–0.70)
Heparin Unfractionated: 0.45 IU/mL (ref 0.30–0.70)

## 2020-10-11 LAB — APTT
aPTT: 105 seconds — ABNORMAL HIGH (ref 24–36)
aPTT: 81 seconds — ABNORMAL HIGH (ref 24–36)

## 2020-10-11 MED ORDER — HEPARIN (PORCINE) 25000 UT/250ML-% IV SOLN
750.0000 [IU]/h | INTRAVENOUS | Status: DC
  Administered 2020-10-12: 700 [IU]/h via INTRAVENOUS
  Filled 2020-10-11: qty 250

## 2020-10-11 MED ORDER — OSMOLITE 1.2 CAL PO LIQD
1000.0000 mL | ORAL | Status: DC
  Administered 2020-10-11: 1000 mL
  Filled 2020-10-11 (×2): qty 1000

## 2020-10-11 MED ORDER — ACETAMINOPHEN 650 MG RE SUPP
650.0000 mg | RECTAL | Status: DC | PRN
  Administered 2020-10-11: 650 mg via RECTAL
  Filled 2020-10-11: qty 1

## 2020-10-11 NOTE — Procedures (Signed)
Cortrak  Person Inserting Tube:  Helaine Yackel, RD Tube Type:  Cortrak - 43 inches Tube Location:  Right nare Initial Placement:  Stomach Secured by: Bridle Technique Used to Measure Tube Placement:  Documented cm marking at nare/ corner of mouth Cortrak Secured At:  83 cm   Cortrak Tube Team Note:  Consult received to place a Cortrak feeding tube.   X-ray is required, abdominal x-ray has been ordered by the Cortrak team. Please confirm tube placement before using the Cortrak tube.   If the tube becomes dislodged please keep the tube and contact the Cortrak team at [email or web address] ([email or web address]) for replacement.  If after hours and replacement cannot be delayed, place a NG tube and confirm placement with an abdominal x-ray.   Mariana Single RD, LDN Clinical Nutrition Pager listed in [place]

## 2020-10-11 NOTE — Progress Notes (Signed)
This chaplain responded to the PMT consult for spiritual care.  The chaplain introduced herself to the Pt. duaghter-Janet and wife-Pat both of whom are at the bedside. The Pt. is resting comfortably after the Cortrak procedure. The chaplain understands the Pt. is [place] Orthodox.      The chaplain understands the Pt. family is in a period of watchful waiting after today's procedure.  The chaplain inquired about what the Pt. would choose to tell me about himself. Marcie Bal responded, "I want to go home." Marcie Bal explained the Pt. is very independent and would prefer to be outside in his garden.  The chaplain is available for F/U spiritual care as needed and recognizes more family communication will be needed to honor the Pt. faith tradition.  The chaplain's invitation for intercessory prayer was accepted by the family.

## 2020-10-11 NOTE — Progress Notes (Addendum)
ANTICOAGULATION CONSULT NOTE  Pharmacy Consult for heparin Indication: atrial fibrillation and stroke, DVT  No Known Allergies  Patient Measurements: Height: '5\' 7"'$  (170.2 cm) Weight: 70.8 kg (156 lb 1.4 oz) IBW/kg (Calculated) : 66.1 Heparin Dosing Weight: 70kg  Vital Signs: Temp: 98.4 F (36.9 C) (06/05 1900) Temp Source: Axillary (06/05 1900) BP: 143/101 (06/06 0057) Pulse Rate: 94 (06/06 0057)  Labs: Recent Labs    10/08/20 0209 10/08/20 0816 10/08/20 2235 10/08/20 2235 10/09/20 0816 10/09/20 1737 10/10/20 0429 10/10/20 0810 10/10/20 1746 10/10/20 2348  HGB  --  12.9*  --   --  11.4*  --   --   --   --   --   HCT  --  43.2  --   --  38.1*  --   --   --   --   --   PLT  --  89*  --   --  83*  --   --   --   --   --   APTT  --   --  37*   < > 34   < >  --  >200* >200* 81*  HEPARINUNFRC  --   --  0.73*  --  0.63  --   --  >1.10*  --   --   CREATININE 1.92*  --   --   --  1.80*  --  1.56*  --   --   --    < > = values in this interval not displayed.    Estimated Creatinine Clearance: 25.9 mL/min (A) (by C-G formula based on SCr of 1.56 mg/dL (H)).   Assessment: 87 y.o. male with a hx of CVA/Afib/DVT, Eliquis on hold, for heparin.   Goal of Therapy:  Heparin level 0.3-0.5 units/ml  APTT 66-85 Monitor platelets by anticoagulation protocol: Yes   Plan:  Restart heparin 800 units/hr Check heparin level in 8 hours.   Phillis Knack, PharmD, BCPS  10/11/2020 1:21 AM

## 2020-10-11 NOTE — Progress Notes (Signed)
  Speech Language Pathology Treatment: Dysphagia  Patient Details Name: Robert Mcconnell MRN: [id number] DOB: 03-Jan-1922 Today's Date: 10/11/2020 Time: [id number] SLP Time Calculation (min) (ACUTE ONLY): 21.97 min  Assessment / Plan / Recommendation Clinical Impression  SLP was contacted by RN regarding pt's improved level of alertness. As was discussed with pt's family, SLP returned for reassessment. SLP was able to coordinate return ~1 hour after contact. Pt's family indicated that the pt's level of alertness had waned, but it was still improved compared to the earlier session. Pt opened his eyes with verbal and tactile stimulation, but closed them once stimuli were withdrawn. With max verbal prompts and tactile cues for mouth closure, bolus manipulation, and swallowing, pt demonstrated three swallows during the session. Weak coughing was noted following the initial bolus of thin liquids via half tsp and a wet vocal quality was noted with subsequent boluses of ice chips and thin liquids. Bolus manipulation and deglutition continue to be absent with puree despite support and oral suctioning was ultimately required for removal. Pt's ability to swallow is an improvement compared to this morning; however, his being symptomatic with even small boluses and continued poor bolus awareness continue to place him at significant risk for aspiration. [name], wife and other daughter at bedside) was educated regarding SLP's concerns regarding the pt's current swallow function and there being limited progress since the initial evaluation on 6/2. Following inquiry, pt's family was advised that this SLP does not believe his lack of nutrition has played a significant role in his performance since, per EMR, his swallow function has been notably impaired since the date of admission. Robert Mcconnell expressed that the pt has had similar episodes of lethargy and impairment during acute illnesses and that he has  improved once his illness has resolved. Pt's family have verbalized understanding as well as agreement regarding all areas of education and have expressed hope regarding progress with time. SLP will continue to follow pt.    HPI HPI: Pt is a 87 y.o. male who presented with complaints of shortness of breath. Per MD's note on 6/1, nursing had to "suction a good amount of food out of the patient's mouth and airway as the patient does not seem to be able to swallow food properly." CXR 6/1: Airspace disease in the right mid and lower lung zones could be due to pneumonia or aspiration. PMH: hypertension, atrial fibrillation, CVA with residual right-sided hemiparesis, CKD stage III, and BPH. BSE in June 2021 dysphagia 2 and thin liquids recommended at that time.      SLP Plan  Continue with current plan of care       Recommendations  Diet recommendations: NPO Medication Administration: Via alternative means                Oral Care Recommendations: Oral care QID;Staff/trained caregiver to provide oral care Follow up Recommendations:  (TBD) SLP Visit Diagnosis: Dysphagia, unspecified (R13.10) Plan: Continue with current plan of care       Robert Mcconnell I. Hardin Negus, [place], [place] Office number [phone] Pager [place] 10/11/2020, 1:05 PM

## 2020-10-11 NOTE — Progress Notes (Signed)
Brief Nutrition Note  Consult received for enteral/tube feeding initiation and management.  Adult Enteral Nutrition Protocol initiated. Full assessment to follow.  Admitting Dx: Hypoxia [R09.02] Acute respiratory failure with hypoxia and hypercapnia ([place]) [J96.01, J96.02] Community acquired pneumonia of right lung, unspecified part of lung [J18.9]  Body mass index is 24.45 kg/m. Pt meets criteria for normal based on current BMI.  Labs:  Recent Labs  Lab 10/07/20 0211 10/08/20 0209 10/09/20 0816 10/10/20 0429 10/11/20 0834  NA 144   < > 147* 147* 152*  K 3.5   < > 3.5 4.1 3.6  CL 107   < > 112* 118* 119*  CO2 28   < > '29 23 28  '$ BUN 33*   < > 39* 37* 29*  CREATININE 1.65*   < > 1.80* 1.56* 1.62*  CALCIUM 8.0*   < > 7.9* 7.6* 8.1*  MG 2.1  --   --   --   --   GLUCOSE 127*   < > 154* 132* 139*   < > = values in this interval not displayed.    Larkin Ina, MS, RD, LDN RD pager number and weekend/on-call pager number located in [employer].

## 2020-10-11 NOTE — Progress Notes (Signed)
ANTICOAGULATION CONSULT NOTE - Follow Up Consult  Pharmacy Consult for Heparin Indication: hx of DVT, afib, and CVA  No Known Allergies  Patient Measurements: Height: '5\' 7"'$  (170.2 cm) Weight: 70.8 kg (156 lb 1.4 oz) IBW/kg (Calculated) : 66.1 Heparin Dosing Weight: 70.8 kg  Vital Signs: Temp: 98.3 F (36.8 C) (06/06 0532) Temp Source: Axillary (06/06 0532) BP: 140/97 (06/06 0532) Pulse Rate: 89 (06/06 0532)  Labs: Recent Labs    10/09/20 0816 10/09/20 1737 10/10/20 0429 10/10/20 0810 10/10/20 1746 10/10/20 2348 10/11/20 0834  HGB 11.4*  --   --   --   --   --  11.3*  HCT 38.1*  --   --   --   --   --  39.7  PLT 83*  --   --   --   --   --  91*  APTT 34   < >  --  >200* >200* 81* 105*  HEPARINUNFRC 0.63  --   --  >1.10*  --   --  0.57  CREATININE 1.80*  --  1.56*  --   --   --  1.62*   < > = values in this interval not displayed.    Estimated Creatinine Clearance: 24.9 mL/min (A) (by C-G formula based on SCr of 1.62 mg/dL (H)).   Assessment: Anticoag: PTA eliquis, for hx of DVT, afib, and CVA. Bridging with hep for now. Lower goal d/t plt<100k. - 87 y/o, 70kg, Scr 1.6 - lower extrem [place] neg for DVT bilaterally - Hep level 0.57 slightly above goal, apTT 105 also slightly above goal, Hgb 11.3 stable. Plts 91 stable.  Goal of Therapy:  Heparin level 0.3-0.5 units/ml Monitor platelets by anticoagulation protocol: Yes   Plan:  Decrease IV heparin to Heparin 700 units/hr Recheck HL in 6 hrs. Daily HL, CBC. D/c aPTT's   Daylee Delahoz S. Alford Highland, PharmD, BCPS Clinical Staff Pharmacist [email or web address] Alford Highland, [employer] 10/11/2020,9:55 AM

## 2020-10-11 NOTE — Progress Notes (Signed)
ANTICOAGULATION CONSULT NOTE - Follow Up Consult  Pharmacy Consult for Heparin Indication: hx of DVT, afib, and CVA  No Known Allergies  Patient Measurements: Height: '5\' 7"'$  (170.2 cm) Weight: 70.8 kg (156 lb 1.4 oz) IBW/kg (Calculated) : 66.1 Heparin Dosing Weight: 70.8 kg  Vital Signs: Temp: 98.2 F (36.8 C) (06/06 1007) Temp Source: Oral (06/06 1007) BP: 150/92 (06/06 1007) Pulse Rate: 119 (06/06 1007)  Labs: Recent Labs    10/09/20 0816 10/09/20 1737 10/10/20 0429 10/10/20 0810 10/10/20 1746 10/10/20 2348 10/11/20 0834 10/11/20 1605  HGB 11.4*  --   --   --   --   --  11.3*  --   HCT 38.1*  --   --   --   --   --  39.7  --   PLT 83*  --   --   --   --   --  91*  --   APTT 34   < >  --  >200* >200* 81* 105*  --   HEPARINUNFRC 0.63  --   --  >1.10*  --   --  0.57 0.45  CREATININE 1.80*  --  1.56*  --   --   --  1.62*  --    < > = values in this interval not displayed.    Estimated Creatinine Clearance: 24.9 mL/min (A) (by C-G formula based on SCr of 1.62 mg/dL (H)).   Assessment: Anticoag: PTA eliquis, for hx of DVT, afib, and CVA. Bridging with hep for now. Lower goal d/t plt<100k. - 87 y/o, 70kg, Scr 1.6 - lower extrem [place] neg for DVT bilaterally  Hgb 11.3 stable. Plts 91 stable.  PM heparin level within goal at 0.45   Goal of Therapy:  Heparin level 0.3-0.5 units/ml Monitor platelets by anticoagulation protocol: Yes   Plan:  Continue IV heparin at 700 units/hr Recheck HL in AM Daily HL, CBC. D/c aPTT's   Narjis Mira A. Levada Dy, PharmD, BCPS, FNKF Clinical Pharmacist [place] Please utilize Amion for appropriate phone number to reach the unit pharmacist ([place])   10/11/2020,5:57 PM

## 2020-10-11 NOTE — Progress Notes (Addendum)
PROGRESS NOTE    Robert Mcconnell  [id number] DOB: 29-Oct-1921 DOA: 10/24/2020 PCP: [name], MD    Brief Narrative:  87 y.o. male with medical history significant of hypertension, atrial fibrillation, CVA with residual right-sided hemiparesis, CKD stage III, and BPH presents with complaints of shortness of breath. Noted to be more lethargic recently. Pt was recommended to be on a thickened liquid diet post-prior CVA , however had been on regular diet over the past 16 mos. Family is reportedly already aware of high risk for aspiration prior to visit.  Assessment & Plan:   Principal Problem:   Acute respiratory failure with hypoxia and hypercapnia (HCC) Active Problems:   CKD (chronic kidney disease) stage 4, GFR 15-29 ml/min (HCC)   Thrombocytopenia (HCC)   Atrial fibrillation, chronic (HCC)   Dysphagia   History of CVA with residual deficit   Aspiration pneumonia (HCC)   Hypothyroidism   Acute on chronic systolic CHF (congestive heart failure) (HCC)  Acute respiratory failure with hypoxia and hypercapnia  Sepsis secondary to pneumonia:  -Presented with tachycardia, tachypnea with O2 sats 79% on RA initially -Recently observed to aspirate on trial of PO intake -Currently continued on NRB, appropriate O2 sats -CXR reviewed with findings suggestive of airspace disease in the R mid-lower lung zones due to aspiration -SLP continues to recommend NPO, not candidate for MBS -Likely poor long term prognosis -for now, continue azithromycin and rocephin -cont to wean O2 as tolerated -Palliative Care following  Systolic congestive heart failure chronic, not in exacerbation:  -noted to have +1 pitting edema in the lower extremities and wet sounding lungs.  BNP elevated at 982.  Last EF was noted to be around 30-35% in 2021. -I&O's and daily weights  -2d echo reviewed. EF of 60-65% -Repeat bmet in AM    Chronic atrial fibrillation on anticoagulation with hx CVA:  -Patient  appears currently rate controlled and has been compliant with anticoagulation. -Pt currently NPO -Pt is continued on IV lopressor and heparin gtt  Acute on CKD stage IV: -Cr peaked to 1.92 -Given NPO status, was given basal IVF as tolerated  -Cr now at 1.6 today, seems to be near baseline -Recheck bmet in AM  Hypothyroidism: Last TSH was 3.379 on 11/03/2019 -TSH 3.168 -Continue levothyroxine, continue wtih equivalent dosed IV levothyroxine as pt is NPO  Thrombocytopenia: Chronic.   -Plts down to 80 on 6/2, up to 89 this AM -now on heparin gtt per above -recheck cbc in AM  History of recurrent UTI -UA noted to be pending  BPH -Was continued on terazosin and Proscar, currently on hold secondary to pt being NPO  Hyperlipidemia -was continued on atorvastatin, currently on hold secondary to NPO status  Goals of Care -Pt's wishes are known to be DNR -Given recent events, have consulted Palliative Care -Appreciate recs. Family is aware of the difficult situation, primarily dysphagia. Established pt's wishes for no feeding tubes -Continues to fail swallow eval by SLP. Discussed with Palliative Care. Plan to start trial of tube feeding per coretrak with hopes pt's dysphagia would improve in the next few days -If dysphagia does not improve, may need to discuss possible transition to hospice  Anxiety --Discussed with family, pt noted to be sensitive to sedatives. Have d/c further sedatives, avoid benzo's moving forward -Arousable, not conversant  DVT prophylaxis: Eliquis Code Status: DNR Family Communication: Pt in room, family is present at bedside  Status is: Inpatient  Remains inpatient appropriate because:Inpatient level of care appropriate due  to severity of illness   Dispo: The patient is from: Home              Anticipated d/c is to: Unknown at this time              Patient currently is not medically stable to d/c.   Difficult to place patient No     Consultants:   Palliative Care  Procedures:     Antimicrobials: Anti-infectives (From admission, onward)   Start     Dose/Rate Route Frequency Ordered Stop   10/08/20 1600  vancomycin (VANCOREADY) IVPB 1250 mg/250 mL  Status:  Discontinued        1,250 mg 166.7 mL/hr over 90 Minutes Intravenous Every 48 hours 10/09/2020 1456 11/02/2020 1615   10/07/20 1400  ceFEPIme (MAXIPIME) 2 g in sodium chloride 0.9 % 100 mL IVPB  Status:  Discontinued        2 g 200 mL/hr over 30 Minutes Intravenous Every 24 hours 10/22/2020 1451 10/28/2020 1615   10/07/20 1000  cefTRIAXone (ROCEPHIN) 2 g in sodium chloride 0.9 % 100 mL IVPB        2 g 200 mL/hr over 30 Minutes Intravenous Every 24 hours 10/22/2020 1615 10/11/20 1039   10/07/20 1000  azithromycin (ZITHROMAX) 500 mg in sodium chloride 0.9 % 250 mL IVPB        500 mg 250 mL/hr over 60 Minutes Intravenous Every 24 hours 10/30/2020 1615 10/10/20 1150   11/01/2020 1300  vancomycin (VANCOREADY) IVPB 1500 mg/300 mL        1,500 mg 150 mL/hr over 120 Minutes Intravenous  Once 10/27/2020 1253 11/02/2020 1658   10/09/2020 1300  ceFEPIme (MAXIPIME) 2 g in sodium chloride 0.9 % 100 mL IVPB        2 g 200 mL/hr over 30 Minutes Intravenous  Once 10/07/2020 1253 10/07/2020 1448      Subjective: Cannot assess given inability to speak  Objective: Vitals:   10/10/20 1900 10/11/20 0057 10/11/20 0532 10/11/20 1007  BP: (!) 144/73 (!) 143/101 (!) 140/97 (!) 150/92  Pulse: 86 94 89 (!) 119  Resp:  '17 18 19  '$ Temp: 98.4 F (36.9 C)  98.3 F (36.8 C) 98.2 F (36.8 C)  TempSrc: Axillary  Axillary Oral  SpO2: 97% 97% 96% 98%  Weight:      Height:        Intake/Output Summary (Last 24 hours) at 10/11/2020 1554 Last data filed at 10/11/2020 1009 Gross per 24 hour  Intake 1769.35 ml  Output 750 ml  Net 1019.35 ml   Filed Weights   10/07/20 0828  Weight: 70.8 kg    Examination: General exam: Awake, laying in bed, in nad Respiratory system: Normal respiratory effort, no  wheezing Cardiovascular system: regular rate, s1, s2 Gastrointestinal system: Soft, nondistended, positive BS Central nervous system: CN2-12 grossly intact, strength intact Extremities: Perfused, no clubbing Skin: Normal skin turgor, no notable skin lesions seen Psychiatry: Unable to assess given mentation  Data Reviewed: I have personally reviewed following labs and imaging studies  CBC: Recent Labs  Lab 10/31/2020 1255 10/07/20 0211 10/08/20 0816 10/09/20 0816 10/11/20 0834  WBC 3.6* 3.6* 4.7 4.1 5.4  NEUTROABS 3.0 2.9  --   --   --   HGB 13.0 11.4* 12.9* 11.4* 11.3*  HCT 44.0 38.8* 43.2 38.1* 39.7  MCV 101.4* 101.3* 99.1 98.7 102.3*  PLT 86* 80* 89* 83* 91*   Basic Metabolic Panel: Recent Labs  Lab 10/07/20 0211 10/08/20  [id number] 10/09/20 0816 10/10/20 0429 10/11/20 0834  NA 144 147* 147* 147* 152*  K 3.5 3.0* 3.5 4.1 3.6  CL 107 109 112* 118* 119*  CO2 '28 31 29 23 28  '$ GLUCOSE 127* 89 154* 132* 139*  BUN 33* 40* 39* 37* 29*  CREATININE 1.65* 1.92* 1.80* 1.56* 1.62*  CALCIUM 8.0* 8.2* 7.9* 7.6* 8.1*  MG 2.1  --   --   --   --    GFR: Estimated Creatinine Clearance: 24.9 mL/min (A) (by C-G formula based on SCr of 1.62 mg/dL (H)). Liver Function Tests: Recent Labs  Lab 11/04/2020 1255 10/09/20 0816  AST 22 19  ALT 13 11  ALKPHOS 52 43  BILITOT 0.7 0.8  PROT 6.6 5.7*  ALBUMIN 2.7* 2.1*   No results for input(s): LIPASE, AMYLASE in the last 168 hours. No results for input(s): AMMONIA in the last 168 hours. Coagulation Profile: Recent Labs  Lab 10/08/2020 1255  INR 1.3*   Cardiac Enzymes: No results for input(s): CKTOTAL, CKMB, CKMBINDEX, TROPONINI in the last 168 hours. BNP (last 3 results) No results for input(s): PROBNP in the last 8760 hours. HbA1C: No results for input(s): HGBA1C in the last 72 hours. CBG: No results for input(s): GLUCAP in the last 168 hours. Lipid Profile: No results for input(s): CHOL, HDL, LDLCALC, TRIG, CHOLHDL, LDLDIRECT in the  last 72 hours. Thyroid Function Tests: No results for input(s): TSH, T4TOTAL, FREET4, T3FREE, THYROIDAB in the last 72 hours. Anemia Panel: No results for input(s): VITAMINB12, FOLATE, FERRITIN, TIBC, IRON, RETICCTPCT in the last 72 hours. Sepsis Labs: Recent Labs  Lab 10/27/2020 1255 10/23/2020 1844  PROCALCITON  --  0.28  LATICACIDVEN 2.0* 1.1    Recent Results (from the past 240 hour(s))  Resp Panel by RT-PCR (Flu A&B, Covid) Nasopharyngeal Swab     Status: None   Collection Time: 10/19/2020 12:44 PM   Specimen: Nasopharyngeal Swab; Nasopharyngeal(NP) swabs in vial transport medium  Result Value Ref Range Status   SARS Coronavirus 2 by RT PCR NEGATIVE NEGATIVE Final    Comment: (NOTE) SARS-CoV-2 target nucleic acids are NOT DETECTED.  The SARS-CoV-2 RNA is generally detectable in upper respiratory specimens during the acute phase of infection. The lowest concentration of SARS-CoV-2 viral copies this assay can detect is 138 copies/mL. A negative result does not preclude SARS-Cov-2 infection and should not be used as the sole basis for treatment or other patient management decisions. A negative result may occur with  improper specimen collection/handling, submission of specimen other than nasopharyngeal swab, presence of viral mutation(s) within the areas targeted by this assay, and inadequate number of viral copies(<138 copies/mL). A negative result must be combined with clinical observations, patient history, and epidemiological information. The expected result is Negative.  Fact Sheet for Patients:  EntrepreneurPulse.com.au  Fact Sheet for Healthcare Providers:  IncredibleEmployment.be  This test is no t yet approved or cleared by the [place] FDA and  has been authorized for detection and/or diagnosis of SARS-CoV-2 by FDA under an Emergency Use Authorization (EUA). This EUA will remain  in effect (meaning this test can be used) for  the duration of the COVID-19 declaration under Section 564(b)(1) of the Act, 21 U.S.C.section 360bbb-3(b)(1), unless the authorization is terminated  or revoked sooner.       Influenza A by PCR NEGATIVE NEGATIVE Final   Influenza B by PCR NEGATIVE NEGATIVE Final    Comment: (NOTE) The Xpert Xpress SARS-CoV-2/FLU/RSV plus assay is intended as an aid in  the diagnosis of influenza from Nasopharyngeal swab specimens and should not be used as a sole basis for treatment. Nasal washings and aspirates are unacceptable for Xpert Xpress SARS-CoV-2/FLU/RSV testing.  Fact Sheet for Patients: EntrepreneurPulse.com.au  Fact Sheet for Healthcare Providers: IncredibleEmployment.be  This test is not yet approved or cleared by the [place] FDA and has been authorized for detection and/or diagnosis of SARS-CoV-2 by FDA under an Emergency Use Authorization (EUA). This EUA will remain in effect (meaning this test can be used) for the duration of the COVID-19 declaration under Section 564(b)(1) of the Act, 21 U.S.C. section 360bbb-3(b)(1), unless the authorization is terminated or revoked.  Performed at [facility], [place] [address], [place], [place] [address]   Culture, blood (routine x 2)     Status: None   Collection Time: 10/17/2020  2:13 PM   Specimen: BLOOD RIGHT FOREARM  Result Value Ref Range Status   Specimen Description BLOOD RIGHT FOREARM  Final   Special Requests   Final    BOTTLES DRAWN AEROBIC AND ANAEROBIC Blood Culture results may not be optimal due to an inadequate volume of blood received in culture bottles   Culture   Final    NO GROWTH 5 DAYS Performed at [facility], [place] [address] [place], [place] [address]    Report Status 10/11/2020 FINAL  Final  Culture, blood (routine x 2)     Status: None (Preliminary result)   Collection Time: 10/07/2020  6:44 PM   Specimen: BLOOD  Result Value Ref Range Status   Specimen  Description BLOOD SITE NOT SPECIFIED  Final   Special Requests   Final    BOTTLES DRAWN AEROBIC AND ANAEROBIC Blood Culture adequate volume   Culture   Final    NO GROWTH 4 DAYS Performed at [facility], [place] [address], [place], [place] [address]    Report Status PENDING  Incomplete     Radiology Studies: DG Abd Portable 1V  Result Date: 10/11/2020 CLINICAL DATA:  Status post feeding tube placement. EXAM: PORTABLE ABDOMEN - 1 VIEW COMPARISON:  None. FINDINGS: Feeding tube is in place with its tip in the distal descending duodenum. IMPRESSION: As above. Electronically Signed   By: Inge Rise M.D.   On: 10/11/2020 14:13    Scheduled Meds: . levothyroxine  12.5 mcg Intravenous Daily  . metoprolol tartrate  2.5 mg Intravenous Q6H  . sodium chloride flush  3 mL Intravenous Q12H   Continuous Infusions: . sodium chloride Stopped (10/07/20 2036)  . dextrose 5 % and 0.9% NaCl 50 mL/hr at 10/11/20 1007  . famotidine (PEPCID) IV 20 mg (10/11/20 1149)  . heparin 700 Units/hr (10/11/20 1010)     LOS: 5 days   Marylu Lund, MD [facility] Pager On Amion  If 7PM-7AM, please contact night-coverage 10/11/2020, 3:54 PM

## 2020-10-11 NOTE — Plan of Care (Signed)
  Problem: Safety: Goal: Ability to remain free from injury will improve Outcome: Progressing   

## 2020-10-11 NOTE — Progress Notes (Addendum)
Occupational Therapy Co-Treatment w/ PT Patient Details Name: Robert Mcconnell MRN: [id number] DOB: 29-Oct-1921 Today's Date: 10/11/2020    History of present illness 87 y.o. male presenting to ED on 6/1 with SOB, temp 99.1, cough and lethargy. Patient admitted with acute respiratory failure with hypoxia and hypercapnia and sepsis secondary to pneumonia. Concer for aspiration pneumonia. PMHx significant for HTN, A-fib, CVA with residual R-sided weakness, CKD III, and BPH.   OT comments  Patient met lying supine in bed. Does not attempt to open eyes despite therapists/family's attempts to arouse patient. Patient progressed from supine to EOB with Total A +2. Able to maintain static sitting balance at EOB with Min A at most and Min guard at best for up to 8 min. Patient requires Total A to wash face seated EOB. Attempts made to assist patient to come to standing position but patient requiring Total A +2 with use of chuck pad and unable to clear hips from bed surface. Patient did open eyes for brief period of time during standing attempt. Patient continues to follow 1-step verbal commands with less than or equal to 10% accuracy. Family present at bedside and encouraging/supportive. OT will continue to follow acutely.      Follow Up Recommendations  SNF;Home health OT;Supervision/Assistance - 24 hour    Equipment Recommendations  None recommended by OT (Patient has necessary equipment)    Recommendations for Other Services      Precautions / Restrictions Precautions Precautions: Fall Precaution Comments: Monitor vitals Restrictions Weight Bearing Restrictions: No       Mobility Bed Mobility Overal bed mobility: Needs Assistance Bed Mobility: Rolling;Sidelying to Sit;Sit to Supine Rolling: Total assist;+2 for physical assistance;+2 for safety/equipment Sidelying to sit: Total assist;+2 for physical assistance;+2 for safety/equipment   Sit to supine: Total assist;+2 for physical  assistance;+2 for safety/equipment   General bed mobility comments: Patient completes less than 10% of task. Seems to attempt to initiate for brief periods of time. Total A +2 for all parts of bed mobility.    Transfers Overall transfer level: Needs assistance Equipment used: 2 person hand held assist Transfers: Sit to/from Stand Sit to Stand: Total assist;+2 physical assistance;+2 safety/equipment;From elevated surface         General transfer comment: Total A +2 x2 trials with use of chuck pad and knee block. Patient completes less than 10% of task. Unable to clear bottom from bed surface. Patient opened eyes for brief period of time upon standing.    Balance Overall balance assessment: Needs assistance Sitting-balance support: Single extremity supported;Feet supported Sitting balance-Leahy Scale: Poor Sitting balance - Comments: Maintains static sitting balance at EOB for 8 min with Min guard and best and Min A at most. LUE supported on pillows. LOB in all directions but able to self correct most times. Keeps head forward. Eyes closed throughout. Postural control: Other (comment);Left lateral lean;Right lateral lean;Posterior lean (LOB in all directions but able to self correct)   Standing balance-Leahy Scale: Zero Standing balance comment: Dependent +2 with use of chuck pad. Unable to clear bottom from bed surface. Seems to attempt to initiate but ultimately completes less than 10% of task.                           ADL either performed or assessed with clinical judgement   ADL Overall ADL's : Needs assistance/impaired Eating/Feeding: NPO   Grooming: Total assistance Grooming Details (indicate cue type and reason): Patient completes less than  10% of task with hand over hand assist.             Lower Body Dressing: Total assistance Lower Body Dressing Details (indicate cue type and reason): Total A to adjust footwear seated EOB.               General ADL  Comments: Dependent     Vision       Perception     Praxis      Cognition Arousal/Alertness: Lethargic Behavior During Therapy: Flat affect Overall Cognitive Status: Difficult to assess                                 General Comments: Pt following 1 step commands inconsistently with incr time        Exercises     Shoulder Instructions       General Comments Daughter Marcie Bal and wife Fraser Din present at bedside.    Pertinent Vitals/ Pain       Pain Assessment: Faces Faces Pain Scale: Hurts little more Pain Location: Generalized with movement Pain Descriptors / Indicators: Moaning Pain Intervention(s): Limited activity within patient's tolerance;Monitored during session;Repositioned  Home Living                                          Prior Functioning/Environment              Frequency  Min 2X/week        Progress Toward Goals  OT Goals(current goals can now be found in the care plan section)  Progress towards OT goals: Progressing toward goals  Acute Rehab OT Goals Patient Stated Goal: Per wife; for patient to return home. OT Goal Formulation: With patient/family Time For Goal Achievement: 10/21/20 Potential to Achieve Goals: Fair ADL Goals Additional ADL Goal #1: Patient will follow 1-step verbal commands with 50% accuracy in prep for ADLs at bed level. Additional ADL Goal #2: Patient will complete bed mobility with Mod A +1 in prep for functioanl transfers. Additional ADL Goal #3: Patient will complete 1/3 grooming tasks at bed level with Min A and max multimodal cues.  Plan Discharge plan remains appropriate;Frequency remains appropriate    Co-evaluation                 AM-PAC OT "6 Clicks" Daily Activity     Outcome Measure   Help from another person eating meals?: Total Help from another person taking care of personal grooming?: Total Help from another person toileting, which includes using toliet,  bedpan, or urinal?: Total Help from another person bathing (including washing, rinsing, drying)?: Total Help from another person to put on and taking off regular upper body clothing?: Total Help from another person to put on and taking off regular lower body clothing?: Total 6 Click Score: 6    End of Session Equipment Utilized During Treatment: Oxygen;Other (comment)  OT Visit Diagnosis: Muscle weakness (generalized) (M62.81);Cognitive communication deficit (R41.841);Hemiplegia and hemiparesis Symptoms and signs involving cognitive functions: Cerebral infarction Hemiplegia - Right/Left: Right Hemiplegia - dominant/non-dominant: Dominant Hemiplegia - caused by: Cerebral infarction   Activity Tolerance  (venturi mask)   Patient Left in bed;with call bell/phone within reach;with bed alarm set;with family/visitor present   Nurse Communication          Time: 1335-1401 OT Time Calculation (min): 26 min  Charges: OT General Charges $OT Visit: 1 Visit OT Treatments $Self Care/Home Management : 8-22 mins  Yevonne Yokum H. OTR/L Supplemental OT, Department of rehab services [phone]   Niaja Stickley R H. 10/11/2020, 2:24 PM

## 2020-10-11 NOTE — Plan of Care (Signed)
  Problem: Pain Managment: Goal: General experience of comfort will improve Outcome: Progressing   Problem: Safety: Goal: Ability to remain free from injury will improve Outcome: Progressing   

## 2020-10-11 NOTE — Progress Notes (Signed)
  Speech Language Pathology Treatment: Dysphagia  Patient Details Name: Robert Mcconnell MRN: [id number] DOB: 1922-02-05 Today's Date: 10/11/2020 Time: [id number] SLP Time Calculation (min) (ACUTE ONLY): 21.92 min  Assessment / Plan / Recommendation Clinical Impression  Pt was seen for dysphagia treatment. He was lethargic and his daughter reported that the pt is less alert Robert when he was seen by SLP yesterday. Pt's alertness improved with oral care and pt demonstrated intermittent vocalization when swabbing oral mucosa. Pt presented similarly to when he was last seen by this SLP on 6/3. Bolus awareness was impaired and pt did not demonstrate any bolus manipulation or swallowing despite verbal/tactile cues. Boluses were ultimately removed from the oral cavity via oral suction. Pt still does not present as a candidate for safe oral intake or for instrumental assessment; it is recommended that his NPO status be maintained. Pt's daughter indicated that they are awaiting placement of a Cortrak and Dr. Earlie Counts stated that this is the plan pending palliative care discussion. Pt's daughter reported that the pt becomes more alert around 1000 and she was advised that SLP can attempt reassessment if his alertness improves significantly at that time.    HPI HPI: Pt is a 87 y.o. male who presented with complaints of shortness of breath. Per MD's note on 6/1, nursing had to "suction a good amount of food out of the patient's mouth and airway as the patient does not seem to be able to swallow food properly." CXR 6/1: Airspace disease in the right mid and lower lung zones could be due to pneumonia or aspiration. PMH: hypertension, atrial fibrillation, CVA with residual right-sided hemiparesis, CKD stage III, and BPH. BSE in June 2021 dysphagia 2 and thin liquids recommended at that time.      SLP Plan  Continue with current plan of care       Recommendations  Diet recommendations: NPO Medication Administration: Via  alternative means                Oral Care Recommendations: Oral care QID;Staff/trained caregiver to provide oral care Follow up Recommendations:  (TBD) SLP Visit Diagnosis: Dysphagia, unspecified (R13.10) Plan: Continue with current plan of care       Carol Loftin I. Hardin Negus, [place], [place] Office number [phone] Pager [place] 10/11/2020, 9:16 AM

## 2020-10-11 NOTE — Progress Notes (Signed)
Physical Therapy Treatment Patient Details Name: Robert Mcconnell MRN: [id number] DOB: February 21, 1922 Today's Date: 10/11/2020    History of Present Illness 87 y.o. male presenting to ED on 6/1 with SOB, temp 99.1, cough and lethargy. Patient admitted with acute respiratory failure with hypoxia and hypercapnia and sepsis secondary to pneumonia. Concer for aspiration pneumonia. PMHx significant for HTN, A-fib, CVA with residual R-sided weakness, CKD III, and BPH.    PT Comments    Pt admitted with above diagnosis. Pt was able to sit EOB 8 min with min guard assist for some periods of time vs. Min assist at times as he fatigued. Sitting balance improved from last visit. ATtempt to stand unsuccessful.  Will continue to progress as pt able.  Pt currently with functional limitations due to the deficits listed below (see PT Problem List). Pt will benefit from skilled PT to increase their independence and safety with mobility to allow discharge to the venue listed below.     Follow Up Recommendations  Home health PT;Other (comment) (vs palliative services)     Equipment Recommendations  None recommended by PT    Recommendations for Other Services       Precautions / Restrictions Precautions Precautions: Fall Precaution Comments: Monitor vitals Restrictions Weight Bearing Restrictions: No    Mobility  Bed Mobility Overal bed mobility: Needs Assistance Bed Mobility: Rolling;Sidelying to Sit;Sit to Supine Rolling: Total assist;+2 for physical assistance;+2 for safety/equipment Sidelying to sit: Total assist;+2 for physical assistance;+2 for safety/equipment   Sit to supine: Total assist;+2 for physical assistance;+2 for safety/equipment   General bed mobility comments: Patient completes less than 10% of task. Seems to attempt to initiate for brief periods of time. Total A +2 for all parts of bed mobility.    Transfers Overall transfer level: Needs assistance Equipment used: 2 person hand  held assist Transfers: Sit to/from Stand Sit to Stand: Total assist;+2 physical assistance;+2 safety/equipment;From elevated surface         General transfer comment: Total A +2 x2 trials with use of chuck pad and knee block. Patient completes less than 10% of task. Unable to clear bottom from bed surface. Patient opened eyes for brief period of time upon standing. Scooted pt toward [facility] with total assist of 2 with use of bed pad.  Ambulation/Gait                 Stairs             Wheelchair Mobility    Modified Rankin (Stroke Patients Only)       Balance Overall balance assessment: Needs assistance Sitting-balance support: Single extremity supported;Feet supported Sitting balance-Leahy Scale: Poor Sitting balance - Comments: Maintains static sitting balance at EOB for 8 min with Min guard and best and Min A at most. LUE supported on pillows. LOB in all directions but able to self correct most times. Keeps head forward. Eyes closed throughout. Postural control: Other (comment);Left lateral lean;Right lateral lean;Posterior lean (LOB in all directions but able to self correct)   Standing balance-Leahy Scale: Zero Standing balance comment: Dependent +2 with use of chuck pad. Unable to clear bottom from bed surface. Seems to attempt to initiate but ultimately completes less than 10% of task.                            Cognition Arousal/Alertness: Lethargic Behavior During Therapy: Flat affect Overall Cognitive Status: Difficult to assess  General Comments: Pt following 1 step commands inconsistently with incr time      Exercises General Exercises - Lower Extremity Heel Slides: PROM;Right;Supine    General Comments General comments (skin integrity, edema, etc.): Daughter Marcie Bal and wife Fraser Din present at bedside.      Pertinent Vitals/Pain Pain Assessment: Faces Faces Pain Scale: Hurts little more Pain  Location: Generalized with movement Pain Descriptors / Indicators: Moaning Pain Intervention(s): Limited activity within patient's tolerance;Monitored during session;Repositioned    Home Living                      Prior Function            PT Goals (current goals can now be found in the care plan section) Acute Rehab PT Goals Patient Stated Goal: Per wife; for patient to return home. Progress towards PT goals: Progressing toward goals    Frequency    Min 3X/week      PT Plan Current plan remains appropriate    Co-evaluation PT/OT/SLP Co-Evaluation/Treatment: Yes Reason for Co-Treatment: Complexity of the patient's impairments (multi-system involvement);For patient/therapist safety PT goals addressed during session: Mobility/safety with mobility OT goals addressed during session: ADL's and self-care      AM-PAC PT "6 Clicks" Mobility   Outcome Measure  Help needed turning from your back to your side while in a flat bed without using bedrails?: Total Help needed moving from lying on your back to sitting on the side of a flat bed without using bedrails?: Total Help needed moving to and from a bed to a chair (including a wheelchair)?: Total Help needed standing up from a chair using your arms (e.g., wheelchair or bedside chair)?: Total Help needed to walk in hospital room?: Total Help needed climbing 3-5 steps with a railing? : Total 6 Click Score: 6    End of Session Equipment Utilized During Treatment: Oxygen Activity Tolerance: Patient limited by fatigue Patient left: in bed;with call bell/phone within reach;with bed alarm set;with family/visitor present Nurse Communication: Need for lift equipment;Mobility status PT Visit Diagnosis: Other abnormalities of gait and mobility (R26.89);Muscle weakness (generalized) (M62.81);Adult, failure to thrive (R62.7);Hemiplegia and hemiparesis Hemiplegia - Right/Left: Right Hemiplegia - dominant/non-dominant:  Dominant Hemiplegia - caused by: Cerebral infarction     Time: 1335-1401 PT Time Calculation (min) (ACUTE ONLY): 26 min  Charges:  $Therapeutic Activity: 8-22 mins                     Jannatul Wojdyla M,PT Acute Rehab Services [phone] (pager)   [name] 10/11/2020, 3:57 PM

## 2020-10-11 NOTE — Progress Notes (Signed)
Palliative Medicine Inpatient Follow Up Note  Reason for consult:  Goals of care "Goals of care, discussion regarding ?PEG/alternative means of nutrition wishes"  HPI:  Per intake H&P --> Robert Mcconnell 87 y.o.malewith medical history significant ofhypertension, atrial fibrillation, CVA with residual right-sided hemiparesis,CKD stage III,andBPHpresents with complaints of shortness of breath. Noted to be more lethargic recently. Pt was recommended to be on a thickened liquid diet post-prior CVA , however had been on regular diet over the past 16 mos. Family is reportedly already aware of high risk for aspiration prior to visit.  Palliative care has been asked to get involved to further aid in goals of care conversations in the setting of recurrent aspirational events and speech therapy deeming that Robert Mcconnell has an unsafe swallowing ability.  Today's Discussion (10/11/2020):  *Please note that this is a verbal dictation therefore any spelling or grammatical errors are due to the "Robert Mcconnell One" system interpretation.  Chart reviewed.  I met with Robert Mcconnell, his wife Robert Mcconnell and his other daughter who lives in [place] at bedside.  They both share that Robert Mcconnell has been very lethargic throughout the morning.  He has not been conversant.  Upon my assessment he was somnolent and minimally arousable to converse.  Called patient's daughter, Robert Mcconnell who is Robert Mcconnell healthcare power of attorney to further discuss how the weekend had gone.  Robert Mcconnell shares with me that Robert Mcconnell was evaluated yesterday by speech therapy and was identified to have "some swallow".  She had a detailed conversation with Dr. Wyline Copas and the agreement was made to try a core track for a few days to see if this optimizes Robert Mcconnell strength enough to whereby he can participate in a modified barium swallow study.  Robert Mcconnell does not want to make any decisions until at least a trial of these interventions have been pursued to get a better  estimation of how well Robert Mcconnell may or may not be able to recover.  Another concerning factor is Robert Mcconnell lack of mobility over his 5-day hospitalization.  At home he was able to participate in sitting at the side of the bed though in-house he has not been able to even follow one-step commands well enough to do so.  Have requested speech therapy, Occupational Therapy and physical therapy reach out to Robert Mcconnell to provide her regular updates.  Of note Robert Mcconnell is much more awake after 10 AM which time it is optimal to work with him.  Plan for core track placement today.  Discussed the importance of continued conversation with family and their  medical providers regarding overall plan of care and treatment options, ensuring decisions are within the context of the patients values and GOCs.  Questions and concerns addressed   Objective Assessment: Vital Signs Vitals:   10/11/20 0532 10/11/20 1007  BP: (!) 140/97 (!) 150/92  Pulse: 89 (!) 119  Resp: 18 19  Temp: 98.3 F (36.8 C)   SpO2: 96% 98%    Intake/Output Summary (Last 24 hours) at 10/11/2020 1032 Last data filed at 10/11/2020 0641 Gross per 24 hour  Intake 1669.35 ml  Output 750 ml  Net 919.35 ml   Last Weight  Most recent update: 10/07/2020  8:29 AM   Weight  70.8 kg (156 lb 1.4 oz)           Gen: Frail Elderly M in moderate distress HEENT: Dry mucous membranes CV: Regular rate and rhythm  PULM: 8LPM FM ABD: soft/nontender  EXT: (+) Trace pedal edema Neuro: Alert unable to  phonate  SUMMARY OF RECOMMENDATIONS DNAR/DNI  Will place GOLD DNR on Chart  Family plan to bring Advance directives to be scanned into chart  Patient would not wish for a long term Gastrotomy tube per conversations with his daughter though she would like a trial of Coretrack- NGT to see if this improves him enough from a strength perspective to then enable him to complete the MBSS  PT/OT for strength optimization  Appreciate Spiritual Care -  Patient is Orthodox  Ongoing incremental PMT Support  Time Spent: 35 Greater than 50% of the time was spent in counseling and coordination of care ______________________________________________________________________________________ [place] Team Team Cell Phone: [phone] Please utilize secure chat with additional questions, if there is no response within 30 minutes please call the above phone number  Palliative Medicine Team providers are available by phone from 7am to 7pm daily and can be reached through the team cell phone.  Should this patient require assistance outside of these hours, please call the patient's attending physician.

## 2020-10-12 LAB — CULTURE, BLOOD (ROUTINE X 2)
Culture: NO GROWTH
Special Requests: ADEQUATE

## 2020-10-12 LAB — CBC
HCT: 38.9 % — ABNORMAL LOW (ref 39.0–52.0)
Hemoglobin: 11.1 g/dL — ABNORMAL LOW (ref 13.0–17.0)
MCH: 29.8 pg (ref 26.0–34.0)
MCHC: 28.5 g/dL — ABNORMAL LOW (ref 30.0–36.0)
MCV: 104.3 fL — ABNORMAL HIGH (ref 80.0–100.0)
Platelets: 91 10*3/uL — ABNORMAL LOW (ref 150–400)
RBC: 3.73 MIL/uL — ABNORMAL LOW (ref 4.22–5.81)
RDW: 16.6 % — ABNORMAL HIGH (ref 11.5–15.5)
WBC: 5.3 10*3/uL (ref 4.0–10.5)
nRBC: 0 % (ref 0.0–0.2)

## 2020-10-12 LAB — BASIC METABOLIC PANEL
Anion gap: 8 (ref 5–15)
BUN: 33 mg/dL — ABNORMAL HIGH (ref 8–23)
CO2: 26 mmol/L (ref 22–32)
Calcium: 8.3 mg/dL — ABNORMAL LOW (ref 8.9–10.3)
Chloride: 117 mmol/L — ABNORMAL HIGH (ref 98–111)
Creatinine, Ser: 1.7 mg/dL — ABNORMAL HIGH (ref 0.61–1.24)
GFR, Estimated: 36 mL/min — ABNORMAL LOW (ref >60)
Glucose, Bld: 171 mg/dL — ABNORMAL HIGH (ref 70–99)
Potassium: 3.6 mmol/L (ref 3.5–5.1)
Sodium: 151 mmol/L — ABNORMAL HIGH (ref 135–145)

## 2020-10-12 LAB — GLUCOSE, CAPILLARY
Glucose-Capillary: 148 mg/dL — ABNORMAL HIGH (ref 70–99)
Glucose-Capillary: 156 mg/dL — ABNORMAL HIGH (ref 70–99)
Glucose-Capillary: 169 mg/dL — ABNORMAL HIGH (ref 70–99)
Glucose-Capillary: 176 mg/dL — ABNORMAL HIGH (ref 70–99)
Glucose-Capillary: 177 mg/dL — ABNORMAL HIGH (ref 70–99)
Glucose-Capillary: 184 mg/dL — ABNORMAL HIGH (ref 70–99)

## 2020-10-12 LAB — HEPARIN LEVEL (UNFRACTIONATED): Heparin Unfractionated: 0.34 IU/mL (ref 0.30–0.70)

## 2020-10-12 LAB — APTT: aPTT: 75 seconds — ABNORMAL HIGH (ref 24–36)

## 2020-10-12 MED ORDER — SODIUM CHLORIDE 0.45 % IV SOLN: INTRAVENOUS | Status: DC

## 2020-10-12 MED ORDER — OSMOLITE 1.2 CAL PO LIQD
1000.0000 mL | ORAL | Status: DC
  Administered 2020-10-12: 1000 mL
  Filled 2020-10-12 (×3): qty 1000

## 2020-10-12 MED ORDER — METOPROLOL TARTRATE 5 MG/5ML IV SOLN
5.0000 mg | Freq: Four times a day (QID) | INTRAVENOUS | Status: DC
  Administered 2020-10-12 – 2020-10-13 (×3): 5 mg via INTRAVENOUS
  Filled 2020-10-12 (×3): qty 5

## 2020-10-12 NOTE — Progress Notes (Signed)
PROGRESS NOTE    Robert Mcconnell  [id number] DOB: 1921/08/27 DOA: 10/24/2020 PCP: [name], MD    Brief Narrative:  87 y.o. male with medical history significant of hypertension, atrial fibrillation, CVA with residual right-sided hemiparesis, CKD stage III, and BPH presents with complaints of shortness of breath. Noted to be more lethargic recently. Pt was recommended to be on a thickened liquid diet post-prior CVA , however had been on regular diet over the past 16 mos. Family is reportedly already aware of high risk for aspiration prior to visit.  Assessment & Plan:   Principal Problem:   Acute respiratory failure with hypoxia and hypercapnia (HCC) Active Problems:   CKD (chronic kidney disease) stage 4, GFR 15-29 ml/min (HCC)   Thrombocytopenia (HCC)   Atrial fibrillation, chronic (HCC)   Dysphagia   History of CVA with residual deficit   Aspiration pneumonia (HCC)   Hypothyroidism   Acute on chronic systolic CHF (congestive heart failure) (HCC)  Acute respiratory failure with hypoxia and hypercapnia  Sepsis secondary to pneumonia:  -Presented with tachycardia, tachypnea with O2 sats 79% on RA initially -Recently observed to aspirate on trial of PO intake -CXR reviewed with findings suggestive of airspace disease in the R mid-lower lung zones due to aspiration -SLP continues to recommend NPO, not candidate for MBS -Likely poor long term prognosis -for now, continue azithromycin and rocephin -remains on Venturi mask -Palliative Care following, see below  Systolic congestive heart failure chronic, not in exacerbation:  -noted to have +1 pitting edema in the lower extremities and wet sounding lungs.  BNP elevated at 982.  Last EF was noted to be around 30-35% in 2021. -I&O's and daily weights  -2d echo reviewed. EF of 60-65% -Currently on 1/2 NS for mild hypernatremia    Chronic atrial fibrillation on anticoagulation with hx CVA:  -Patient appears currently  rate controlled and has been compliant with anticoagulation. -Pt currently NPO -Pt is continued on IV lopressor and heparin gtt  Acute on CKD stage IV: -Cr peaked to 1.92 -Given NPO status, was given basal IVF as tolerated  -Cr now at 1.7, on 1/2 NS -Recheck bmet in AM  Hypothyroidism: Last TSH was 3.379 on 11/03/2019 -TSH 3.168 -Continue levothyroxine, continue wtih equivalent dosed IV levothyroxine as pt is NPO  Thrombocytopenia: Chronic.   -Plts down to 80 on 6/2, up to 91 this AM -Continued on heparin gtt per above hx of afib -recheck cbc in AM  History of recurrent UTI -UA noted to be pending  BPH -Was continued on terazosin and Proscar, currently on hold secondary to pt being NPO  Hyperlipidemia -was continued on atorvastatin, currently on hold secondary to NPO status  Goals of Care -Pt's wishes are known to be DNR -Given recent events, have consulted Palliative Care -Appreciate recs. Family is aware of the difficult situation, primarily dysphagia. Established pt's wishes for no feeding tubes -Continues to fail swallow eval by SLP.Now with trial of tube feed via Coretrak -See documentation. If no significant improvement noted despite tube feeding by 6/8, then likely transition to hospice  Anxiety --Discussed with family, pt noted to be sensitive to sedatives. Have d/c further sedatives, avoid benzo's moving forward -Arousable, not conversant  Hypernatremia -Na of 151 today -Have continued pt on 1/2 NS -Recheck bmet in AM  DVT prophylaxis: Eliquis Code Status: DNR Family Communication: Pt in room, family is present at bedside  Status is: Inpatient  Remains inpatient appropriate because:Inpatient level of care appropriate due to  severity of illness   Dispo: The patient is from: Home              Anticipated d/c is to: Unknown at this time              Patient currently is not medically stable to d/c.   Difficult to place patient No    Consultants:    Palliative Care  Procedures:     Antimicrobials: Anti-infectives (From admission, onward)   Start     Dose/Rate Route Frequency Ordered Stop   10/08/20 1600  vancomycin (VANCOREADY) IVPB 1250 mg/250 mL  Status:  Discontinued        1,250 mg 166.7 mL/hr over 90 Minutes Intravenous Every 48 hours 10/14/2020 1456 11/02/2020 1615   10/07/20 1400  ceFEPIme (MAXIPIME) 2 g in sodium chloride 0.9 % 100 mL IVPB  Status:  Discontinued        2 g 200 mL/hr over 30 Minutes Intravenous Every 24 hours 10/08/2020 1451 10/25/2020 1615   10/07/20 1000  cefTRIAXone (ROCEPHIN) 2 g in sodium chloride 0.9 % 100 mL IVPB        2 g 200 mL/hr over 30 Minutes Intravenous Every 24 hours 11/03/2020 1615 10/11/20 1039   10/07/20 1000  azithromycin (ZITHROMAX) 500 mg in sodium chloride 0.9 % 250 mL IVPB        500 mg 250 mL/hr over 60 Minutes Intravenous Every 24 hours 10/10/2020 1615 10/10/20 1150   10/11/2020 1300  vancomycin (VANCOREADY) IVPB 1500 mg/300 mL        1,500 mg 150 mL/hr over 120 Minutes Intravenous  Once 10/27/2020 1253 10/16/2020 1658   10/28/2020 1300  ceFEPIme (MAXIPIME) 2 g in sodium chloride 0.9 % 100 mL IVPB        2 g 200 mL/hr over 30 Minutes Intravenous  Once 10/21/2020 1253 10/19/2020 1448      Subjective: Unable to assess given level of alertness  Objective: Vitals:   10/11/20 2135 10/12/20 0402 10/12/20 0719 10/12/20 1122  BP: (!) 165/87 139/85 (!) 150/55 (!) 106/58  Pulse: 61 80  91  Resp: '16 20 19 18  '$ Temp: 98 F (36.7 C) 97.9 F (36.6 C) 97.8 F (36.6 C)   TempSrc: Axillary Axillary Axillary   SpO2: 97% 98% 98% 98%  Weight:      Height:        Intake/Output Summary (Last 24 hours) at 10/12/2020 1458 Last data filed at 10/12/2020 [id number] Gross per 24 hour  Intake --  Output 575 ml  Net -575 ml   Filed Weights   10/07/20 0828  Weight: 70.8 kg    Examination: General exam: Lethargic but somewhat arousable, in no acute distress Respiratory system: normal chest rise, clear, no  audible wheezing Cardiovascular system: regular rhythm, s1-s2 Gastrointestinal system: Nondistended, nontender, pos BS Central nervous system: No seizures, no tremors Extremities: No cyanosis, no joint deformities Skin: No rashes, no pallor Psychiatry: unable to assess   Data Reviewed: I have personally reviewed following labs and imaging studies  CBC: Recent Labs  Lab 11/02/2020 1255 10/07/20 0211 10/08/20 0816 10/09/20 0816 10/11/20 0834 10/12/20 0354  WBC 3.6* 3.6* 4.7 4.1 5.4 5.3  NEUTROABS 3.0 2.9  --   --   --   --   HGB 13.0 11.4* 12.9* 11.4* 11.3* 11.1*  HCT 44.0 38.8* 43.2 38.1* 39.7 38.9*  MCV 101.4* 101.3* 99.1 98.7 102.3* 104.3*  PLT 86* 80* 89* 83* 91* 91*   Basic Metabolic Panel: Recent  Labs  Lab 10/07/20 0211 10/08/20 0209 10/09/20 0816 10/10/20 0429 10/11/20 0834 10/12/20 0354  NA 144 147* 147* 147* 152* 151*  K 3.5 3.0* 3.5 4.1 3.6 3.6  CL 107 109 112* 118* 119* 117*  CO2 '28 31 29 23 28 26  '$ GLUCOSE 127* 89 154* 132* 139* 171*  BUN 33* 40* 39* 37* 29* 33*  CREATININE 1.65* 1.92* 1.80* 1.56* 1.62* 1.70*  CALCIUM 8.0* 8.2* 7.9* 7.6* 8.1* 8.3*  MG 2.1  --   --   --   --   --    GFR: Estimated Creatinine Clearance: 23.8 mL/min (A) (by C-G formula based on SCr of 1.7 mg/dL (H)). Liver Function Tests: Recent Labs  Lab 10/08/2020 1255 10/09/20 0816  AST 22 19  ALT 13 11  ALKPHOS 52 43  BILITOT 0.7 0.8  PROT 6.6 5.7*  ALBUMIN 2.7* 2.1*   No results for input(s): LIPASE, AMYLASE in the last 168 hours. No results for input(s): AMMONIA in the last 168 hours. Coagulation Profile: Recent Labs  Lab 10/09/2020 1255  INR 1.3*   Cardiac Enzymes: No results for input(s): CKTOTAL, CKMB, CKMBINDEX, TROPONINI in the last 168 hours. BNP (last 3 results) No results for input(s): PROBNP in the last 8760 hours. HbA1C: No results for input(s): HGBA1C in the last 72 hours. CBG: Recent Labs  Lab 10/11/20 1929 10/12/20 0007 10/12/20 0400 10/12/20 0717  10/12/20 1144  GLUCAP 135* 148* 156* 176* 184*   Lipid Profile: No results for input(s): CHOL, HDL, LDLCALC, TRIG, CHOLHDL, LDLDIRECT in the last 72 hours. Thyroid Function Tests: No results for input(s): TSH, T4TOTAL, FREET4, T3FREE, THYROIDAB in the last 72 hours. Anemia Panel: No results for input(s): VITAMINB12, FOLATE, FERRITIN, TIBC, IRON, RETICCTPCT in the last 72 hours. Sepsis Labs: Recent Labs  Lab 10/25/2020 1255 10/08/2020 1844  PROCALCITON  --  0.28  LATICACIDVEN 2.0* 1.1    Recent Results (from the past 240 hour(s))  Resp Panel by RT-PCR (Flu A&B, Covid) Nasopharyngeal Swab     Status: None   Collection Time: 10/29/2020 12:44 PM   Specimen: Nasopharyngeal Swab; Nasopharyngeal(NP) swabs in vial transport medium  Result Value Ref Range Status   SARS Coronavirus 2 by RT PCR NEGATIVE NEGATIVE Final    Comment: (NOTE) SARS-CoV-2 target nucleic acids are NOT DETECTED.  The SARS-CoV-2 RNA is generally detectable in upper respiratory specimens during the acute phase of infection. The lowest concentration of SARS-CoV-2 viral copies this assay can detect is 138 copies/mL. A negative result does not preclude SARS-Cov-2 infection and should not be used as the sole basis for treatment or other patient management decisions. A negative result may occur with  improper specimen collection/handling, submission of specimen other than nasopharyngeal swab, presence of viral mutation(s) within the areas targeted by this assay, and inadequate number of viral copies(<138 copies/mL). A negative result must be combined with clinical observations, patient history, and epidemiological information. The expected result is Negative.  Fact Sheet for Patients:  EntrepreneurPulse.com.au  Fact Sheet for Healthcare Providers:  IncredibleEmployment.be  This test is no t yet approved or cleared by the [place] FDA and  has been authorized for detection and/or  diagnosis of SARS-CoV-2 by FDA under an Emergency Use Authorization (EUA). This EUA will remain  in effect (meaning this test can be used) for the duration of the COVID-19 declaration under Section 564(b)(1) of the Act, 21 U.S.C.section 360bbb-3(b)(1), unless the authorization is terminated  or revoked sooner.       Influenza  A by PCR NEGATIVE NEGATIVE Final   Influenza B by PCR NEGATIVE NEGATIVE Final    Comment: (NOTE) The Xpert Xpress SARS-CoV-2/FLU/RSV plus assay is intended as an aid in the diagnosis of influenza from Nasopharyngeal swab specimens and should not be used as a sole basis for treatment. Nasal washings and aspirates are unacceptable for Xpert Xpress SARS-CoV-2/FLU/RSV testing.  Fact Sheet for Patients: EntrepreneurPulse.com.au  Fact Sheet for Healthcare Providers: IncredibleEmployment.be  This test is not yet approved or cleared by the [place] FDA and has been authorized for detection and/or diagnosis of SARS-CoV-2 by FDA under an Emergency Use Authorization (EUA). This EUA will remain in effect (meaning this test can be used) for the duration of the COVID-19 declaration under Section 564(b)(1) of the Act, 21 U.S.C. section 360bbb-3(b)(1), unless the authorization is terminated or revoked.  Performed at [facility], [place] [address] [place], [place] [address]   Culture, blood (routine x 2)     Status: None   Collection Time: 10/28/2020  2:13 PM   Specimen: BLOOD RIGHT FOREARM  Result Value Ref Range Status   Specimen Description BLOOD RIGHT FOREARM  Final   Special Requests   Final    BOTTLES DRAWN AEROBIC AND ANAEROBIC Blood Culture results may not be optimal due to an inadequate volume of blood received in culture bottles   Culture   Final    NO GROWTH 5 DAYS Performed at [facility], [place] [address], [place], [place] [address]    Report Status 10/11/2020 FINAL  Final  Culture, blood (routine x 2)      Status: None   Collection Time: 10/27/2020  6:44 PM   Specimen: BLOOD  Result Value Ref Range Status   Specimen Description BLOOD SITE NOT SPECIFIED  Final   Special Requests   Final    BOTTLES DRAWN AEROBIC AND ANAEROBIC Blood Culture adequate volume   Culture   Final    NO GROWTH 5 DAYS Performed at [facility], [place] [address], [place], [place] [address]    Report Status 10/12/2020 FINAL  Final     Radiology Studies: DG Abd Portable 1V  Result Date: 10/11/2020 CLINICAL DATA:  Status post feeding tube placement. EXAM: PORTABLE ABDOMEN - 1 VIEW COMPARISON:  None. FINDINGS: Feeding tube is in place with its tip in the distal descending duodenum. IMPRESSION: As above. Electronically Signed   By: Inge Rise M.D.   On: 10/11/2020 14:13    Scheduled Meds: . levothyroxine  12.5 mcg Intravenous Daily  . metoprolol tartrate  2.5 mg Intravenous Q6H  . sodium chloride flush  3 mL Intravenous Q12H   Continuous Infusions: . sodium chloride 75 mL/hr at 10/12/20 0824  . sodium chloride Stopped (10/07/20 2036)  . famotidine (PEPCID) IV 20 mg (10/12/20 1132)  . feeding supplement (OSMOLITE 1.2 CAL) 65 mL/hr at 10/12/20 1421  . heparin 700 Units/hr (10/12/20 0435)     LOS: 6 days   Marylu Lund, MD [facility] Pager On Amion  If 7PM-7AM, please contact night-coverage 10/12/2020, 2:58 PM

## 2020-10-12 NOTE — Progress Notes (Signed)
  Speech Language Pathology Treatment: Dysphagia  Patient Details Name: Robert Mcconnell MRN: [id number] DOB: 1922-03-01 Today's Date: 10/12/2020 Time: [id number] SLP Time Calculation (min) (ACUTE ONLY): 15 min  Assessment / Plan / Recommendation Clinical Impression  Pt was seen for skilled ST targeting PO trials.  Pt was encountered asleep in bed with Ventimask and Cortrak in place, and with wife and daughter present at bedside.  Family reported that they wanted the pt to attempt PO trials on this date.  Ventimask was briefly removed for PO trials and oral care with monitoring of O2 stats.  Pt briefly roused to max verbal and tactile cues, but he was lethargic throughout this session.  Oral care was completed without difficulty.  Of note, pt appeared to have dried blood on his soft palate, but SLP was unable to clear with a suction swab.  Ice chip was trialed, and pt passively accepted it into his oral cavity.  Oral holding was observed, with no apparent awareness of the bolus c/b no attempts at lingual manipulation or swallow initiation.  Bolus was suctioned from the pt's oral cavity secondary to aspiration concerns.  Ventimask was repositioned and no additional PO trials were attempted on this date.  Recommend continuation of NPO with short-term alternative means of nutrition.     HPI HPI: Pt is a 87 y.o. male who presented with complaints of shortness of breath. Per MD's note on 6/1, nursing had to "suction a good amount of food out of the patient's mouth and airway as the patient does not seem to be able to swallow food properly." CXR 6/1: Airspace disease in the right mid and lower lung zones could be due to pneumonia or aspiration. PMH: hypertension, atrial fibrillation, CVA with residual right-sided hemiparesis, CKD stage III, and BPH. BSE in June 2021 dysphagia 2 and thin liquids recommended at that time.      SLP Plan  Continue with current plan of care       Recommendations  Diet  recommendations: NPO Medication Administration: Via alternative means                Oral Care Recommendations: Oral care QID;Staff/trained caregiver to provide oral care Follow up Recommendations: Other (comment) (TBD) SLP Visit Diagnosis: Dysphagia, unspecified (R13.10) Plan: Continue with current plan of care       GO               Colin Mulders., M.S., [place] Office: [phone]  [place] 10/12/2020, 10:33 AM

## 2020-10-12 NOTE — Progress Notes (Signed)
ANTICOAGULATION CONSULT NOTE - Follow Up Consult  Pharmacy Consult for Heparin Indication: hx of DVT, afib, and CVA  No Known Allergies  Patient Measurements: Height: '5\' 7"'$  (170.2 cm) Weight: 70.8 kg (156 lb 1.4 oz) IBW/kg (Calculated) : 66.1 Heparin Dosing Weight: 70.8 kg  Vital Signs: Temp: 97.8 F (36.6 C) (06/07 0719) Temp Source: Axillary (06/07 0719) BP: 150/55 (06/07 0719) Pulse Rate: 80 (06/07 0402)  Labs: Recent Labs    10/10/20 0429 10/10/20 0810 10/10/20 2348 10/11/20 0834 10/11/20 1605 10/12/20 0354  HGB  --   --   --  11.3*  --  11.1*  HCT  --   --   --  39.7  --  38.9*  PLT  --   --   --  91*  --  91*  APTT  --    < > 81* 105*  --  75*  HEPARINUNFRC  --    < >  --  0.57 0.45 0.34  CREATININE 1.56*  --   --  1.62*  --  1.70*   < > = values in this interval not displayed.    Estimated Creatinine Clearance: 23.8 mL/min (A) (by C-G formula based on SCr of 1.7 mg/dL (H)).   Assessment: Anticoag: PTA eliquis, for hx of DVT, afib, and CVA. Bridging with hep for now. Lower goal d/t plt<100k. - 87 y/o, 70kg, Scr 1.6 - lower extrem [place] neg for DVT bilaterally - Hep level 0.34 in goal. aPTT in goal, Hgb 11.1 stable. Plts 91 stable.  Goal of Therapy:  Heparin level 0.3-0.5 units/ml Monitor platelets by anticoagulation protocol: Yes   Plan:  Con't  IV heparin at Heparin 700 units/hr. Daily HL, CBC. D/c aPTT's   Elisia Stepp S. Alford Highland, PharmD, BCPS Clinical Staff Pharmacist [email or web address] Alford Highland, [place] 10/12/2020,9:08 AM

## 2020-10-12 NOTE — Plan of Care (Signed)
  Problem: Safety: Goal: Ability to remain free from injury will improve Outcome: Progressing   Problem: Skin Integrity: Goal: Risk for impaired skin integrity will decrease Outcome: Not Progressing

## 2020-10-12 NOTE — Progress Notes (Addendum)
Notified by telemetry patient had 19 beat run of vtach, attending notified. BP 153/74. Oxygen 98%, patient resting comfortably in bed.

## 2020-10-12 NOTE — Progress Notes (Signed)
Palliative Medicine Inpatient Follow Up Note  Reason for consult:  Goals of care "Goals of care, discussion regarding ?PEG/alternative means of nutrition wishes"  HPI:  Per intake H&P --> Robert Mcconnell 87 y.o.malewith medical history significant ofhypertension, atrial fibrillation, CVA with residual right-sided hemiparesis,CKD stage III,andBPHpresents with complaints of shortness of breath. Noted to be more lethargic recently. Pt was recommended to be on a thickened liquid diet post-prior CVA , however had been on regular diet over the past 16 mos. Family is reportedly already aware of high risk for aspiration prior to visit.  Palliative care has been asked to get involved to further aid in goals of care conversations in the setting of recurrent aspirational events and speech therapy deeming that Robert Mcconnell has an unsafe swallowing ability.  Today's Discussion (10/12/2020):  *Please note that this is a verbal dictation therefore any spelling or grammatical errors are due to the "[place] [employer]" system interpretation.  Chart reviewed.  I met with Robert Mcconnell at bedside this morning.  He was lethargic though did arouse when spoken to.  As of yesterday he has had a core track placed.  He was able to work with physical therapy and Occupational Therapy yesterday and tolerate sitting at the side of the bed, he did require complete assist with this effort.  Plan remains to trial Robert Mcconnell on core track feedings over this week to see if his strength increases enough so that he may tolerate a modified barium swallow study.  This will further help guide the patient's family in terms of goals of care.    At this point in time patient's daughter and decision maker, Robert Mcconnell remain hopeful that he can get back to his baseline level of functioning.  Robert Mcconnell though is realistic that this may not be the case and she may need to think about alternative modalities of care and potentially hospice Robert Mcconnell neglects  to improve and/or shows the inability to safely swallow.  Questions and concerns addressed   Objective Assessment: Vital Signs Vitals:   10/12/20 0402 10/12/20 0719  BP: 139/85 (!) 150/55  Pulse: 80   Resp: 20 19  Temp: 97.9 F (36.6 C) 97.8 F (36.6 C)  SpO2: 98% 98%    Intake/Output Summary (Last 24 hours) at 10/12/2020 5809 Last data filed at 10/12/2020 9833 Gross per 24 hour  Intake 100 ml  Output 575 ml  Net -475 ml   Last Weight  Most recent update: 10/07/2020  8:29 AM   Weight  70.8 kg (156 lb 1.4 oz)           Gen: Frail Elderly M in moderate distress HEENT: Dry mucous membranes CV: Regular rate and rhythm  PULM: 8LPM Face tent ABD: soft/nontender  EXT: (+) Trace pedal edema Neuro: Alert unable to phonate  SUMMARY OF RECOMMENDATIONS DNAR/DNI  GOLD DNR on Chart  Family plan to bring Advance directives to be scanned into chart  Patient would not wish for a long term Gastrotomy tube per conversations with his daughter though she would like a trial of Coretrack- NGT to see if this improves him enough from a strength perspective to then enable him to complete the MBSS  PT/OT for strength optimization  Appreciate Spiritual Care - Patient is Orthodox  Ongoing incremental PMT Support, will not be present tomorrow though I will request that my colleague follows up on Robert Mcconnell progress  Time Spent: 25 Greater than 50% of the time was spent in counseling and coordination of care ______________________________________________________________________________________ Tacey Ruiz  [place] Team Team Cell Phone: [phone] Please utilize secure chat with additional questions, if there is no response within 30 minutes please call the above phone number  Palliative Medicine Team providers are available by phone from 7am to 7pm daily and can be reached through the team cell phone.  Should this patient require assistance outside of these  hours, please call the patient's attending physician.

## 2020-10-12 NOTE — Progress Notes (Signed)
Initial Nutrition Assessment  DOCUMENTATION CODES:   Severe malnutrition in context of chronic illness  INTERVENTION:  Continue TF via Cortrak:  -Osmolite 1.2 cal @ 30m/hr (156105md)  Provides 1872 kcals, 86g protein, 127931mree water Meets 100% of needs  NUTRITION DIAGNOSIS:   Severe Malnutrition related to chronic illness (previous CVA w/ residual R sided hemiparesis) as evidenced by severe muscle depletion,severe fat depletion,energy intake < or equal to 75% for > or equal to 1 month.  GOAL:   Patient will meet greater than or equal to 90% of their needs  MONITOR:   Labs,I & O's,TF tolerance,Skin  REASON FOR ASSESSMENT:   Consult Enteral/tube feeding initiation and management  ASSESSMENT:   Pt admitted with acute respiratory failure with hypoxia and hypercapnia. PMH includes HTN, Afib, CVA w/ residual R-sided hemiparesis,CKD stage III, and BPH.  It was recommended that pt follow a thickened liquid diet post his prior CVA; however, pt has continued to follow a regular diet over the past 16 months. Pt's family aware of pt's high risk for aspiration PTA. SLP has been following, but pt has had recurrent aspirational events and has been determined to have unsafe swallowing ability.   Palliative Medicine has also been following pt. Plan was made for pt to have trial of nutrition support in hopes of increasing strength enough to be able to participate in MBS. Cortrak placed by RD on 6/6, postpyloric tip per xray, and TF protocol of Osmolite 1.2 @ 54m52m was initiated (rate titrated from 20ml8mdue to concerns for refeeding). Discussed pt with RN who reports pt tolerating TF with no issues and is at goal rate. RN also reports that pt's daughter has decided that family would like to remove Cortrak and take pt home on hospice if no improvement is shown by tomorrow morning.   UOP: 575ml 67mhours I/O: -732.3ml si30m admit Stool: 1x unmeasured stool occurrence x24  hours  Medications: IV pepcid IVF: 1/2 NS @ 75ml/hr41mcent Labs  Lab 10/07/20 0211 10/08/20 0209 10/10/20 0429 10/11/20 0834 10/12/20 0354  NA 144   < > 147* 152* 151*  K 3.5   < > 4.1 3.6 3.6  CL 107   < > 118* 119* 117*  CO2 28   < > '23 28 26  '$ BUN 33*   < > 37* 29* 33*  CREATININE 1.65*   < > 1.56* 1.62* 1.70*  CALCIUM 8.0*   < > 7.6* 8.1* 8.3*  MG 2.1  --   --   --   --   GLUCOSE 127*   < > 132* 139* 171*   < > = values in this interval not displayed.  CBGs [id number] - FOCUSED PHYSICAL EXAM:  Flowsheet Row Most Recent Value  Orbital Region Severe depletion  Upper Arm Region Severe depletion  Thoracic and Lumbar Region Severe depletion  Buccal Region Severe depletion  Temple Region Severe depletion  Clavicle Bone Region Severe depletion  Clavicle and Acromion Bone Region Severe depletion  Scapular Bone Region Severe depletion  Dorsal Hand Severe depletion  Patellar Region Severe depletion  Anterior Thigh Region Severe depletion  Posterior Calf Region Severe depletion  Edema (RD Assessment) Moderate  Hair Reviewed  Eyes Reviewed  Mouth Reviewed  Skin Reviewed  Nails Reviewed       Diet Order:   Diet Order            Diet NPO time specified  Diet effective now  EDUCATION NEEDS:   No education needs have been identified at this time  Skin:  Skin Assessment: Skin Integrity Issues: Skin Integrity Issues:: Stage II Stage II: coccyx  Last BM:  6/6 type 4  Height:   Ht Readings from Last 1 Encounters:  10/07/20 '5\' 7"'$  (1.702 m)    Weight:   Wt Readings from Last 1 Encounters:  10/07/20 70.8 kg    BMI:  Body mass index is 24.45 kg/m.  Estimated Nutritional Needs:   Kcal:  1700-1900  Protein:  85-95g  Fluid:  >1.7L/d    Robert Ina, MS, RD, LDN RD pager number and weekend/on-call pager number located in [place].

## 2020-10-12 NOTE — Progress Notes (Signed)
Spoke with patient's daughter who stated that if the patient did not show any improvement by tomorrow morning that she and family want to remove cortrak tube and take patient home on hospice. Attending MD notified.

## 2020-10-13 ENCOUNTER — Ambulatory Visit: Payer: Medicare Other

## 2020-10-13 DIAGNOSIS — E43 Unspecified severe protein-calorie malnutrition: Secondary | ICD-10-CM

## 2020-10-13 DIAGNOSIS — R54 Age-related physical debility: Secondary | ICD-10-CM

## 2020-10-13 DIAGNOSIS — Z515 Encounter for palliative care: Secondary | ICD-10-CM

## 2020-10-13 DIAGNOSIS — J9601 Acute respiratory failure with hypoxia: Secondary | ICD-10-CM

## 2020-10-13 DIAGNOSIS — G9341 Metabolic encephalopathy: Secondary | ICD-10-CM

## 2020-10-13 DIAGNOSIS — J9602 Acute respiratory failure with hypercapnia: Secondary | ICD-10-CM

## 2020-10-13 LAB — BASIC METABOLIC PANEL
Anion gap: 4 — ABNORMAL LOW (ref 5–15)
BUN: 36 mg/dL — ABNORMAL HIGH (ref 8–23)
CO2: 31 mmol/L (ref 22–32)
Calcium: 8.2 mg/dL — ABNORMAL LOW (ref 8.9–10.3)
Chloride: 117 mmol/L — ABNORMAL HIGH (ref 98–111)
Creatinine, Ser: 1.87 mg/dL — ABNORMAL HIGH (ref 0.61–1.24)
GFR, Estimated: 32 mL/min — ABNORMAL LOW (ref >60)
Glucose, Bld: 209 mg/dL — ABNORMAL HIGH (ref 70–99)
Potassium: 4.1 mmol/L (ref 3.5–5.1)
Sodium: 152 mmol/L — ABNORMAL HIGH (ref 135–145)

## 2020-10-13 LAB — CBC
HCT: 40.8 % (ref 39.0–52.0)
Hemoglobin: 11.4 g/dL — ABNORMAL LOW (ref 13.0–17.0)
MCH: 29.5 pg (ref 26.0–34.0)
MCHC: 27.9 g/dL — ABNORMAL LOW (ref 30.0–36.0)
MCV: 105.7 fL — ABNORMAL HIGH (ref 80.0–100.0)
Platelets: 88 10*3/uL — ABNORMAL LOW (ref 150–400)
RBC: 3.86 MIL/uL — ABNORMAL LOW (ref 4.22–5.81)
RDW: 16.7 % — ABNORMAL HIGH (ref 11.5–15.5)
WBC: 6.7 10*3/uL (ref 4.0–10.5)
nRBC: 0.3 % — ABNORMAL HIGH (ref 0.0–0.2)

## 2020-10-13 LAB — GLUCOSE, CAPILLARY
Glucose-Capillary: 161 mg/dL — ABNORMAL HIGH (ref 70–99)
Glucose-Capillary: 186 mg/dL — ABNORMAL HIGH (ref 70–99)
Glucose-Capillary: 268 mg/dL — ABNORMAL HIGH (ref 70–99)

## 2020-10-13 LAB — HEPARIN LEVEL (UNFRACTIONATED): Heparin Unfractionated: 0.27 IU/mL — ABNORMAL LOW (ref 0.30–0.70)

## 2020-10-13 MED ORDER — MORPHINE SULFATE (PF) 2 MG/ML IV SOLN: 2.0000 mg | INTRAVENOUS | Status: DC | PRN

## 2020-10-13 MED ORDER — ACETAMINOPHEN 650 MG RE SUPP
650.0000 mg | Freq: Four times a day (QID) | RECTAL | Status: DC | PRN
Start: 2020-10-13 — End: 2020-10-13

## 2020-10-13 MED ORDER — POLYVINYL ALCOHOL 1.4 % OP SOLN
1.0000 [drp] | Freq: Four times a day (QID) | OPHTHALMIC | Status: DC | PRN
  Filled 2020-10-13: qty 15

## 2020-10-13 MED ORDER — ATROPINE SULFATE 1 % OP SOLN
4.0000 [drp] | OPHTHALMIC | Status: DC | PRN
  Filled 2020-10-13: qty 2

## 2020-10-13 MED ORDER — ACETAMINOPHEN 325 MG PO TABS: 650.0000 mg | ORAL_TABLET | Freq: Four times a day (QID) | ORAL | Status: DC | PRN

## 2020-10-13 MED ORDER — MORPHINE SULFATE (PF) 2 MG/ML IV SOLN
1.0000 mg | INTRAVENOUS | Status: DC | PRN
  Administered 2020-10-13: 1 mg via INTRAVENOUS
  Filled 2020-10-13: qty 1

## 2020-10-13 MED ORDER — BACLOFEN 10 MG PO TABS: 5.0000 mg | ORAL_TABLET | Freq: Two times a day (BID) | ORAL | Status: DC | PRN

## 2020-10-13 MED ORDER — MAGIC MOUTHWASH
15.0000 mL | Freq: Four times a day (QID) | ORAL | Status: DC | PRN
  Filled 2020-10-13: qty 15

## 2020-10-13 MED ORDER — LORAZEPAM 2 MG/ML IJ SOLN: 1.0000 mg | INTRAMUSCULAR | Status: DC | PRN

## 2020-10-13 MED ORDER — MORPHINE SULFATE (CONCENTRATE) 10 MG/0.5ML PO SOLN: 5.0000 mg | ORAL | Status: DC | PRN

## 2020-10-13 MED ORDER — LORAZEPAM 2 MG/ML PO CONC: 1.0000 mg | ORAL | Status: DC | PRN

## 2020-10-13 MED ORDER — TEMAZEPAM 7.5 MG PO CAPS: 7.5000 mg | ORAL_CAPSULE | Freq: Every evening | ORAL | Status: DC | PRN

## 2020-10-13 MED ORDER — RISPERIDONE 0.5 MG PO TBDP
0.5000 mg | ORAL_TABLET | Freq: Two times a day (BID) | ORAL | Status: DC
  Filled 2020-10-13 (×2): qty 1

## 2020-10-13 MED ORDER — DIPHENHYDRAMINE HCL 50 MG/ML IJ SOLN: 12.5000 mg | INTRAMUSCULAR | Status: DC | PRN

## 2020-10-13 MED ORDER — LORAZEPAM 1 MG PO TABS: 1.0000 mg | ORAL_TABLET | ORAL | Status: DC | PRN

## 2020-10-15 ENCOUNTER — Ambulatory Visit: Payer: Non-veteran care

## 2020-10-20 ENCOUNTER — Ambulatory Visit: Payer: Medicare Other

## 2020-10-27 ENCOUNTER — Ambulatory Visit: Payer: Non-veteran care

## 2020-11-03 ENCOUNTER — Ambulatory Visit: Payer: Non-veteran care

## 2020-11-05 NOTE — Progress Notes (Signed)
ANTICOAGULATION CONSULT NOTE - Follow Up Consult  Pharmacy Consult for Heparin Indication: hx of DVT, afib, and CVA  No Known Allergies  Patient Measurements: Height: '5\' 7"'$  (170.2 cm) Weight: 70.8 kg (156 lb 1.4 oz) IBW/kg (Calculated) : 66.1 Heparin Dosing Weight: 70.8 kg  Vital Signs: Temp: 97.8 F (36.6 C) (06/08 0406) Temp Source: Axillary (06/08 0406) BP: 151/67 (06/08 0406) Pulse Rate: 86 (06/08 0406)  Labs: Recent Labs    10/10/20 0810 10/10/20 2348 10/11/20 0834 10/11/20 1605 10/12/20 0354 2020-10-16 0356  HGB   < >  --  11.3*  --  11.1* 11.4*  HCT  --   --  39.7  --  38.9* 40.8  PLT  --   --  91*  --  91* 88*  APTT  --  81* 105*  --  75*  --   HEPARINUNFRC  --   --  0.57 0.45 0.34 0.27*  CREATININE  --   --  1.62*  --  1.70* 1.87*   < > = values in this interval not displayed.    Estimated Creatinine Clearance: 21.6 mL/min (A) (by C-G formula based on SCr of 1.87 mg/dL (H)).   Assessment: Anticoag: PTA eliquis, for hx of DVT, afib, and CVA. Bridging with hep for now. Lower goal d/t plt<100k. - 87 y/o, 70kg, Scr 1.6 - lower extrem [place] neg for DVT bilaterally - Hep level 0.27 below goal. Hgb 11.4 stable. Plts 88 stable.  Goal of Therapy:  Heparin level 0.3-0.5 units/ml Monitor platelets by anticoagulation protocol: Yes   Plan:  Increase Heparin to 750 units/hr. Heparin level in 8 hours Daily HL, CBC. D/c aPTT's   Kennede Lusk A. Levada Dy, PharmD, BCPS, FNKF Clinical Pharmacist  Please utilize Amion for appropriate phone number to reach the unit pharmacist ([place])   10/16/2020,7:38 AM

## 2020-11-05 NOTE — Progress Notes (Addendum)
SLP Cancellation Note  Patient Details Name: Ambrose Dona MRN: [id number] DOB: 08-18-1921   Cancelled treatment:       Reason Eval/Treat Not Completed: Other (comment) (Pt's family has decided to transition patient to comfort care. SLP will sign off)  Tee Richeson I. Hardin Negus, [place], [place] Office number [phone] Pager [place] 10-30-2020, 11:41 AM

## 2020-11-05 NOTE — Progress Notes (Signed)
PT Cancellation Note  Patient Details Name: Robert Mcconnell MRN: [id number] DOB: 1922/02/03   Cancelled Treatment:    Reason Eval/Treat Not Completed: Medical issues which prohibited therapy (Pt made comfort care. Sign off.)   [name] 10/19/2020, 11:35 AM [name],PT Acute Rehab Services [phone] (pager)

## 2020-11-05 NOTE — Progress Notes (Signed)
PROGRESS NOTE    Robert Mcconnell  [id number] DOB: 04/19/1922 DOA: 10/20/2020 PCP: [name], MD   Brief Narrative:  87 y.o.malewith medical history significant ofhypertension, atrial fibrillation, CVA with residual right-sided hemiparesis,CKD stage III,andBPHpresents with complaints of shortness of breath. Noted to be more lethargic recently. Pt was recommended to be on a thickened liquid diet post-prior CVA , however had been on regular diet over the past 16 mos. Family is reportedly already aware of high risk for aspiration prior to visit.  Due to worsening of his chronic medical condition and encephalopathy, family decided to transition patient to comfort care.   Assessment & Plan:   Principal Problem:   Acute respiratory failure with hypoxia and hypercapnia (HCC) Active Problems:   CKD (chronic kidney disease) stage 4, GFR 15-29 ml/min (HCC)   Thrombocytopenia (HCC)   Dysphagia as late effect of cerebrovascular accident (CVA)   AKI (acute kidney injury) ([place])   Right hemiparesis (HCC)   Atrial fibrillation, chronic (HCC)   Dysphagia   Chronic respiratory failure with hypoxia (HCC)   Dilated cardiomyopathy (HCC)   History of CVA with residual deficit   Aspiration pneumonia (HCC)   Hypothyroidism   Acute on chronic systolic CHF (congestive heart failure) ([place])   Severe protein-calorie malnutrition ([place])   Hospice care   Acute metabolic encephalopathy   Advanced age   #1 acute hypoxic and hypercapnic respiratory failure 2.  Sepsis secondary to aspiration pneumonia 3.  Chronic systolic congestive heart failure, EF 30% 4.  Chronic atrial fibrillation 5.  Acute kidney injury on CKD stage IV 6.  Hypothyroidism 7.  Comfort care  - Due to worsening of patient's medical condition and extensive discussion with patient's family he has been transitioned to comfort care.  Comfort care order set has been placed.  I anticipate hospital death in next 24  hours    DVT prophylaxis: Patient is comfort care Code Status: DNR/DNI Family Communication: Daughters and wife is present at bedside  Status is: Inpatient  Remains inpatient appropriate because:Inpatient level of care appropriate due to severity of illness   Dispo: The patient is from: Home              Anticipated d/c is to: Home              Patient currently is not medically stable to d/c.  Anticipate hospital death in next 24 hours   Difficult to place patient No       Nutritional status  Nutrition Problem: Severe Malnutrition Etiology: chronic illness (previous CVA w/ residual R sided hemiparesis)  Signs/Symptoms: severe muscle depletion,severe fat depletion,energy intake < or equal to 75% for > or equal to 1 month  Interventions: Tube feeding,Prostat  Body mass index is 24.45 kg/m.  Pressure Injury 11/03/19 Sacrum Posterior Stage 2 -  Partial thickness loss of dermis presenting as a shallow open injury with a red, pink wound bed without slough. (Active)  11/03/19 1100  Location: Sacrum  Location Orientation: Posterior  Staging: Stage 2 -  Partial thickness loss of dermis presenting as a shallow open injury with a red, pink wound bed without slough.  Wound Description (Comments):   Present on Admission: Yes     Pressure Injury 11/03/2020 Coccyx Mid Stage 2 -  Partial thickness loss of dermis presenting as a shallow open injury with a red, pink wound bed without slough. (Active)  10/11/2020 1745  Location: Coccyx  Location Orientation: Mid  Staging: Stage 2 -  Partial thickness  loss of dermis presenting as a shallow open injury with a red, pink wound bed without slough.  Wound Description (Comments):   Present on Admission: Yes          Subjective: I was called to the room by the nursing staff as patient was more unresponsive this morning.  When I evaluated patient at bedside, patient's daughter and wife was also at bedside.  Patient was unresponsive with  diffuse coarse breath sounds requiring nonrebreather.  Patient was unresponsive to physical and verbal stimuli.  His pupils were pinpoint.  He was breathing on his own and had heart rate in 80s.   Examination:  General exam: Appears chronically ill, on nonrebreather Respiratory system: Diffuse coarse breath sounds Cardiovascular system: S1 & S2 heard, RRR. No JVD, murmurs, rubs, gallops or clicks. No pedal edema. Gastrointestinal system: Abdomen is nondistended, soft and nontender. No organomegaly or masses felt. Normal bowel sounds heard. Central nervous system: Unresponsive Extremities: Unable to assess Skin: No rashes, lesions or ulcers Psychiatry: Unresponsive    Objective: Vitals:   10/12/20 1519 10/12/20 2000 10-17-2020 0406 10-17-2020 0823  BP: (!) 153/74 138/68 (!) 151/67 (!) 94/42  Pulse: 88 79 86   Resp: 17 (!) 21 20   Temp:  97.8 F (36.6 C) 97.8 F (36.6 C)   TempSrc:  Axillary Axillary   SpO2: 98% 98% 96%   Weight:      Height:        Intake/Output Summary (Last 24 hours) at 17-Oct-2020 1102 Last data filed at 10/17/20 0600 Gross per 24 hour  Intake 1907.2 ml  Output 575 ml  Net 1332.2 ml   Filed Weights   10/07/20 0828  Weight: 70.8 kg     Data Reviewed:   CBC: Recent Labs  Lab 10/07/2020 1255 10/07/20 0211 10/08/20 0816 10/09/20 0816 10/11/20 0834 10/12/20 0354 2020-10-17 0356  WBC 3.6* 3.6* 4.7 4.1 5.4 5.3 6.7  NEUTROABS 3.0 2.9  --   --   --   --   --   HGB 13.0 11.4* 12.9* 11.4* 11.3* 11.1* 11.4*  HCT 44.0 38.8* 43.2 38.1* 39.7 38.9* 40.8  MCV 101.4* 101.3* 99.1 98.7 102.3* 104.3* 105.7*  PLT 86* 80* 89* 83* 91* 91* 88*   Basic Metabolic Panel: Recent Labs  Lab 10/07/20 0211 10/08/20 0209 10/09/20 0816 10/10/20 0429 10/11/20 0834 10/12/20 0354 Oct 17, 2020 0356  NA 144   < > 147* 147* 152* 151* 152*  K 3.5   < > 3.5 4.1 3.6 3.6 4.1  CL 107   < > 112* 118* 119* 117* 117*  CO2 28   < > '29 23 28 26 31  '$ GLUCOSE 127*   < > 154* 132* 139* 171*  209*  BUN 33*   < > 39* 37* 29* 33* 36*  CREATININE 1.65*   < > 1.80* 1.56* 1.62* 1.70* 1.87*  CALCIUM 8.0*   < > 7.9* 7.6* 8.1* 8.3* 8.2*  MG 2.1  --   --   --   --   --   --    < > = values in this interval not displayed.   GFR: Estimated Creatinine Clearance: 21.6 mL/min (A) (by C-G formula based on SCr of 1.87 mg/dL (H)). Liver Function Tests: Recent Labs  Lab 10/08/2020 1255 10/09/20 0816  AST 22 19  ALT 13 11  ALKPHOS 52 43  BILITOT 0.7 0.8  PROT 6.6 5.7*  ALBUMIN 2.7* 2.1*   No results for input(s): LIPASE, AMYLASE in the  last 168 hours. No results for input(s): AMMONIA in the last 168 hours. Coagulation Profile: Recent Labs  Lab 10/23/2020 1255  INR 1.3*   Cardiac Enzymes: No results for input(s): CKTOTAL, CKMB, CKMBINDEX, TROPONINI in the last 168 hours. BNP (last 3 results) No results for input(s): PROBNP in the last 8760 hours. HbA1C: No results for input(s): HGBA1C in the last 72 hours. CBG: Recent Labs  Lab 10/12/20 1552 10/12/20 2058 Oct 30, 2020 0028 2020-10-30 0410 10/30/20 0822  GLUCAP 177* 169* 186* 161* 268*   Lipid Profile: No results for input(s): CHOL, HDL, LDLCALC, TRIG, CHOLHDL, LDLDIRECT in the last 72 hours. Thyroid Function Tests: No results for input(s): TSH, T4TOTAL, FREET4, T3FREE, THYROIDAB in the last 72 hours. Anemia Panel: No results for input(s): VITAMINB12, FOLATE, FERRITIN, TIBC, IRON, RETICCTPCT in the last 72 hours. Sepsis Labs: Recent Labs  Lab 10/22/2020 1255 11/01/2020 1844  PROCALCITON  --  0.28  LATICACIDVEN 2.0* 1.1    Recent Results (from the past 240 hour(s))  Resp Panel by RT-PCR (Flu A&B, Covid) Nasopharyngeal Swab     Status: None   Collection Time: 10/14/2020 12:44 PM   Specimen: Nasopharyngeal Swab; Nasopharyngeal(NP) swabs in vial transport medium  Result Value Ref Range Status   SARS Coronavirus 2 by RT PCR NEGATIVE NEGATIVE Final    Comment: (NOTE) SARS-CoV-2 target nucleic acids are NOT DETECTED.  The  SARS-CoV-2 RNA is generally detectable in upper respiratory specimens during the acute phase of infection. The lowest concentration of SARS-CoV-2 viral copies this assay can detect is 138 copies/mL. A negative result does not preclude SARS-Cov-2 infection and should not be used as the sole basis for treatment or other patient management decisions. A negative result may occur with  improper specimen collection/handling, submission of specimen other than nasopharyngeal swab, presence of viral mutation(s) within the areas targeted by this assay, and inadequate number of viral copies(<138 copies/mL). A negative result must be combined with clinical observations, patient history, and epidemiological information. The expected result is Negative.  Fact Sheet for Patients:  EntrepreneurPulse.com.au  Fact Sheet for Healthcare Providers:  IncredibleEmployment.be  This test is no t yet approved or cleared by the [place] FDA and  has been authorized for detection and/or diagnosis of SARS-CoV-2 by FDA under an Emergency Use Authorization (EUA). This EUA will remain  in effect (meaning this test can be used) for the duration of the COVID-19 declaration under Section 564(b)(1) of the Act, 21 U.S.C.section 360bbb-3(b)(1), unless the authorization is terminated  or revoked sooner.       Influenza A by PCR NEGATIVE NEGATIVE Final   Influenza B by PCR NEGATIVE NEGATIVE Final    Comment: (NOTE) The Xpert Xpress SARS-CoV-2/FLU/RSV plus assay is intended as an aid in the diagnosis of influenza from Nasopharyngeal swab specimens and should not be used as a sole basis for treatment. Nasal washings and aspirates are unacceptable for Xpert Xpress SARS-CoV-2/FLU/RSV testing.  Fact Sheet for Patients: EntrepreneurPulse.com.au  Fact Sheet for Healthcare Providers: IncredibleEmployment.be  This test is not yet approved or  cleared by the [place] FDA and has been authorized for detection and/or diagnosis of SARS-CoV-2 by FDA under an Emergency Use Authorization (EUA). This EUA will remain in effect (meaning this test can be used) for the duration of the COVID-19 declaration under Section 564(b)(1) of the Act, 21 U.S.C. section 360bbb-3(b)(1), unless the authorization is terminated or revoked.  Performed at [facility], [place] [address], [place], [place] [address]   Culture, blood (  routine x 2)     Status: None   Collection Time: 10/30/2020  2:13 PM   Specimen: BLOOD RIGHT FOREARM  Result Value Ref Range Status   Specimen Description BLOOD RIGHT FOREARM  Final   Special Requests   Final    BOTTLES DRAWN AEROBIC AND ANAEROBIC Blood Culture results may not be optimal due to an inadequate volume of blood received in culture bottles   Culture   Final    NO GROWTH 5 DAYS Performed at [facility], [place] [address], [place], [place] [address]    Report Status 10/11/2020 FINAL  Final  Culture, blood (routine x 2)     Status: None   Collection Time: 10/20/2020  6:44 PM   Specimen: BLOOD  Result Value Ref Range Status   Specimen Description BLOOD SITE NOT SPECIFIED  Final   Special Requests   Final    BOTTLES DRAWN AEROBIC AND ANAEROBIC Blood Culture adequate volume   Culture   Final    NO GROWTH 5 DAYS Performed at [facility], [place] [address] [place], [place] [address]    Report Status 10/12/2020 FINAL  Final         Radiology Studies: DG Abd Portable 1V  Result Date: 10/11/2020 CLINICAL DATA:  Status post feeding tube placement. EXAM: PORTABLE ABDOMEN - 1 VIEW COMPARISON:  None. FINDINGS: Feeding tube is in place with its tip in the distal descending duodenum. IMPRESSION: As above. Electronically Signed   By: Inge Rise M.D.   On: 10/11/2020 14:13        Scheduled Meds: . risperiDONE  0.5 mg Oral BID  . sodium chloride flush  3 mL Intravenous Q12H   Continuous  Infusions: . sodium chloride Stopped (10/07/20 2036)  . feeding supplement (OSMOLITE 1.2 CAL) 1,000 mL (10/12/20 1707)     LOS: 7 days   Time spent= 35 mins    Linzy Darling Arsenio Loader, MD [facility]  If 7PM-7AM, please contact night-coverage  11/02/20, 11:02 AM

## 2020-11-05 NOTE — Death Summary Note (Signed)
DEATH SUMMARY   Patient Details  Name: Robert Mcconnell MRN: [id number] DOB: 25-Apr-1922  Admission/Discharge Information   Admit Date:  10-26-20  Date of Death: Date of Death: Nov 02, 2020  Time of Death: Time of Death: 08-13-1544  Length of Stay: 7  Referring Physician: [name], MD   Reason(s) for Hospitalization  Shortness of Breath  Diagnoses  Preliminary cause of death: Acute respiratory Failure due to Aspiration Pneumonia Secondary Diagnoses (including complications and co-morbidities):  Principal Problem:   Acute respiratory failure with hypoxia and hypercapnia ([place]) Active Problems:   CKD (chronic kidney disease) stage 4, GFR 15-29 ml/min (HCC)   Thrombocytopenia (HCC)   Dysphagia as late effect of cerebrovascular accident (CVA)   AKI (acute kidney injury) ([place])   Right hemiparesis ([place])   Atrial fibrillation, chronic (HCC)   Dysphagia   Chronic respiratory failure with hypoxia ([place])   Dilated cardiomyopathy ([place])   History of CVA with residual deficit   Aspiration pneumonia (HCC)   Hypothyroidism   Acute on chronic systolic CHF (congestive heart failure) ([place])   Severe protein-calorie malnutrition ([place])   Hospice care   Acute metabolic encephalopathy   Advanced age   [facility] Course (including significant findings, care, treatment, and services provided and events leading to death)  87 y.o.malewith medical history significant ofhypertension, atrial fibrillation, CVA with residual right-sided hemiparesis,CKD stage III,andBPHpresents with complaints of shortness of breath. Noted to be more lethargic recently. Pt was recommended to be on a thickened liquid diet post-prior CVA , however had been on regular diet over the past 16 mos. Family is reportedly already aware of high risk for aspiration prior to visit.  Due to worsening of his chronic medical condition and encephalopathy, family decided to transition patient to comfort care.    Pertinent Labs  and Studies  Significant Diagnostic Studies DG Chest Port 1 View  Result Date: 26-Oct-2020 CLINICAL DATA:  Dyspnea.  Decreased oxygen saturation. EXAM: PORTABLE CHEST 1 VIEW COMPARISON:  Single-view of the chest 11/02/2019. FINDINGS: There is extensive airspace disease in the right mid and lower lung zones. Left lung appears clear. Cardiomegaly. No pneumothorax. There is likely a small to moderate right pleural effusion. No left effusion. Aortic atherosclerosis. IMPRESSION: Airspace disease in the right mid and lower lung zones could be due to pneumonia or aspiration. There is likely a right pleural effusion. Cardiomegaly. Aortic Atherosclerosis (ICD10-I70.0). Electronically Signed   By: Inge Rise M.D.   On: 10/26/20 13:22   DG Abd Portable 1V  Result Date: 10/11/2020 CLINICAL DATA:  Status post feeding tube placement. EXAM: PORTABLE ABDOMEN - 1 VIEW COMPARISON:  None. FINDINGS: Feeding tube is in place with its tip in the distal descending duodenum. IMPRESSION: As above. Electronically Signed   By: Inge Rise M.D.   On: 10/11/2020 14:13   ECHOCARDIOGRAM COMPLETE  Result Date: 10/07/2020    ECHOCARDIOGRAM REPORT   Patient Name:   Robert Mcconnell Date of Exam: 10/07/2020 Medical Rec #:  [id number]      Height:       67.0 in Accession #:    [id number]     Weight:       156.1 lb Date of Birth:  1921/06/15      BSA:          1.820 m Patient Age:    87 years       BP:           108/65 mmHg Patient Gender: M  HR:           63 bpm. Exam Location:  Inpatient Procedure: 2D Echo, Cardiac Doppler and Color Doppler Indications:    CHF  History:        Patient has prior history of Echocardiogram examinations, most                 recent 11/04/2019. CAD, Stroke, Arrythmias:Atrial Fibrillation;                 Risk Factors:Hypertension.  Sonographer:    Cammy Brochure Referring Phys: [id number] RONDELL A SMITH IMPRESSIONS  1. Left ventricular ejection fraction, by estimation, is 60 to 65%. The left  ventricle has normal function. The left ventricle has no regional wall motion abnormalities. There is moderate concentric left ventricular hypertrophy and severe basal septal hypertrophy.  2. Right ventricular systolic function is normal. The right ventricular size is normal.  3. Left atrial size was severely dilated.  4. Right atrial size was severely dilated.  5. Tricuspid valve regurgitation is mild to moderate.  6. The mitral valve is degenerative. Mild mitral valve regurgitation. No evidence of mitral stenosis.  7. The aortic valve is calcified. There is moderate calcification of the aortic valve. There is moderate thickening of the aortic valve. Aortic valve regurgitation is mild. Mild aortic valve stenosis. Aortic valve area, by VTI measures 1.73 cm. Aortic valve mean gradient measures 7.0 mmHg. Aortic valve Vmax measures 1.79 m/s.  8. Aortic dilatation noted. Aneurysm of the ascending aorta, measuring 47 mm. There is mild dilatation of the aortic root, measuring 40 mm.  9. The inferior vena cava is normal in size with greater than 50% respiratory variability, suggesting right atrial pressure of 3 mmHg. 10. There is a trivial pericardial effusion is posterior to the left ventricle. FINDINGS  Left Ventricle: Left ventricular ejection fraction, by estimation, is 45 to 50%. The left ventricle has mildly decreased function. The left ventricle demonstrates global hypokinesis. The left ventricular internal cavity size was normal in size. There is  moderate concentric left ventricular hypertrophy. Left ventricular diastolic function could not be evaluated due to atrial fibrillation. Left ventricular diastolic function could not be evaluated. Elevated left ventricular end-diastolic pressure. Right Ventricle: The right ventricular size is normal. No increase in right ventricular wall thickness. Right ventricular systolic function is normal. Left Atrium: Left atrial size was severely dilated. Right Atrium: Right  atrial size was severely dilated. Pericardium: Trivial pericardial effusion is present. The pericardial effusion is posterior to the left ventricle. Mitral Valve: The mitral valve is degenerative in appearance. There is mild thickening of the mitral valve leaflet(s). There is mild calcification of the mitral valve leaflet(s). Mild mitral annular calcification. Mild mitral valve regurgitation. No evidence of mitral valve stenosis. Tricuspid Valve: The tricuspid valve is normal in structure. Tricuspid valve regurgitation is mild to moderate. No evidence of tricuspid stenosis. Aortic Valve: The aortic valve is calcified. There is moderate calcification of the aortic valve. There is moderate thickening of the aortic valve. Aortic valve regurgitation is mild. Mild aortic stenosis is present. Aortic valve mean gradient measures 7.0 mmHg. Aortic valve peak gradient measures 12.8 mmHg. Aortic valve area, by VTI measures 1.73 cm. Pulmonic Valve: The pulmonic valve was normal in structure. Pulmonic valve regurgitation is trivial. No evidence of pulmonic stenosis. Aorta: Aortic dilatation noted. There is mild dilatation of the aortic root, measuring 40 mm. There is an aneurysm involving the ascending aorta measuring 47 mm. Venous: The inferior vena cava  is normal in size with greater than 50% respiratory variability, suggesting right atrial pressure of 3 mmHg. IAS/Shunts: No atrial level shunt detected by color flow Doppler.  LEFT VENTRICLE PLAX 2D LVIDd:         5.50 cm  Diastology LVIDs:         3.80 cm  LV e' medial:   4.28 cm/s LV PW:         1.60 cm  LV E/e' medial: 22.6 LV IVS:        1.80 cm LVOT diam:     2.30 cm LV SV:         64 LV SV Index:   35 LVOT Area:     4.15 cm  RIGHT VENTRICLE RV Basal diam:  3.90 cm RV S prime:     11.90 cm/s LEFT ATRIUM              Index       RIGHT ATRIUM           Index LA diam:        4.10 cm  2.25 cm/m  RA Area:     25.50 cm LA Vol (A2C):   139.0 ml 76.37 ml/m RA Volume:   89.10  ml  48.96 ml/m LA Vol (A4C):   137.0 ml 75.27 ml/m LA Biplane Vol: 140.0 ml 76.92 ml/m  AORTIC VALVE AV Area (Vmax):    1.94 cm AV Area (Vmean):   1.89 cm AV Area (VTI):     1.73 cm AV Vmax:           179.00 cm/s AV Vmean:          116.000 cm/s AV VTI:            0.367 m AV Peak Grad:      12.8 mmHg AV Mean Grad:      7.0 mmHg LVOT Vmax:         83.60 cm/s LVOT Vmean:        52.700 cm/s LVOT VTI:          0.153 m LVOT/AV VTI ratio: 0.42  AORTA Ao Root diam: 4.00 cm Ao Asc diam:  4.70 cm MITRAL VALVE               TRICUSPID VALVE MV Area (PHT): 3.95 cm    TR Peak grad:   35.8 mmHg MV Decel Time: 192 msec    TR Vmax:        299.00 cm/s MV E velocity: 96.80 cm/s                            SHUNTS                            Systemic VTI:  0.15 m                            Systemic Diam: 2.30 cm Fransico Him MD Electronically signed by Fransico Him MD Signature Date/Time: 10/07/2020/10:55:42 AM    Final    VAS [place] LOWER EXTREMITY VENOUS (DVT) (ONLY [facility] & [facility] 7a-7p)  Result Date: 11/02/2020  Lower Venous DVT Study Patient Name:  Robert Mcconnell  Date of Exam:   10/29/2020 Medical Rec #: [id number]       Accession #:    [id number] Date of Birth: December 31, 1921  Patient Gender: M Patient Age:   79Y Exam Location:  [facility] Procedure:      VAS [place] LOWER EXTREMITY VENOUS (DVT) Referring Phys: [id number] PETER C Churchill --------------------------------------------------------------------------------  Indications: Edema.  Comparison Study: no prior Performing Technologist: Archie Patten RVS  Examination Guidelines: A complete evaluation includes B-mode imaging, spectral Doppler, color Doppler, and power Doppler as needed of all accessible portions of each vessel. Bilateral testing is considered an integral part of a complete examination. Limited examinations for reoccurring indications may be performed as noted. The reflux portion of the exam is performed with the patient in reverse Trendelenburg.   +---------+---------------+---------+-----------+----------+-------------------+ RIGHT    CompressibilityPhasicitySpontaneityPropertiesThrombus Aging      +---------+---------------+---------+-----------+----------+-------------------+ CFV      Full           Yes      Yes                                      +---------+---------------+---------+-----------+----------+-------------------+ SFJ      Full                                                             +---------+---------------+---------+-----------+----------+-------------------+ FV Prox  Full                                                             +---------+---------------+---------+-----------+----------+-------------------+ FV Mid   Full                                                             +---------+---------------+---------+-----------+----------+-------------------+ FV DistalFull                                                             +---------+---------------+---------+-----------+----------+-------------------+ PFV      Full                                                             +---------+---------------+---------+-----------+----------+-------------------+ POP      Full           Yes      Yes                                      +---------+---------------+---------+-----------+----------+-------------------+ PTV      Full                                                             +---------+---------------+---------+-----------+----------+-------------------+  PERO                                                  Not well visualized +---------+---------------+---------+-----------+----------+-------------------+   +---------+---------------+---------+-----------+----------+-------------------+ LEFT     CompressibilityPhasicitySpontaneityPropertiesThrombus Aging      +---------+---------------+---------+-----------+----------+-------------------+  CFV      Full           Yes      Yes                                      +---------+---------------+---------+-----------+----------+-------------------+ SFJ      Full                                                             +---------+---------------+---------+-----------+----------+-------------------+ FV Prox  Full                                                             +---------+---------------+---------+-----------+----------+-------------------+ FV Mid   Full                                                             +---------+---------------+---------+-----------+----------+-------------------+ FV DistalFull                                                             +---------+---------------+---------+-----------+----------+-------------------+ PFV      Full                                                             +---------+---------------+---------+-----------+----------+-------------------+ POP      Full           Yes      Yes                                      +---------+---------------+---------+-----------+----------+-------------------+ PTV      Full                                                             +---------+---------------+---------+-----------+----------+-------------------+ PERO  Not well visualized +---------+---------------+---------+-----------+----------+-------------------+     Summary: BILATERAL: - No evidence of deep vein thrombosis seen in the lower extremities, bilaterally. -No evidence of popliteal cyst, bilaterally.   *See table(s) above for measurements and observations. Electronically signed by Harold Barban MD on 11/01/2020 at 10:16:23 PM.    Final     Microbiology Recent Results (from the past 240 hour(s))  Resp Panel by RT-PCR (Flu A&B, Covid) Nasopharyngeal Swab     Status: None   Collection Time: 10/24/2020 12:44 PM   Specimen:  Nasopharyngeal Swab; Nasopharyngeal(NP) swabs in vial transport medium  Result Value Ref Range Status   SARS Coronavirus 2 by RT PCR NEGATIVE NEGATIVE Final    Comment: (NOTE) SARS-CoV-2 target nucleic acids are NOT DETECTED.  The SARS-CoV-2 RNA is generally detectable in upper respiratory specimens during the acute phase of infection. The lowest concentration of SARS-CoV-2 viral copies this assay can detect is 138 copies/mL. A negative result does not preclude SARS-Cov-2 infection and should not be used as the sole basis for treatment or other patient management decisions. A negative result may occur with  improper specimen collection/handling, submission of specimen other than nasopharyngeal swab, presence of viral mutation(s) within the areas targeted by this assay, and inadequate number of viral copies(<138 copies/mL). A negative result must be combined with clinical observations, patient history, and epidemiological information. The expected result is Negative.  Fact Sheet for Patients:  EntrepreneurPulse.com.au  Fact Sheet for Healthcare Providers:  IncredibleEmployment.be  This test is no t yet approved or cleared by the [place] FDA and  has been authorized for detection and/or diagnosis of SARS-CoV-2 by FDA under an Emergency Use Authorization (EUA). This EUA will remain  in effect (meaning this test can be used) for the duration of the COVID-19 declaration under Section 564(b)(1) of the Act, 21 U.S.C.section 360bbb-3(b)(1), unless the authorization is terminated  or revoked sooner.       Influenza A by PCR NEGATIVE NEGATIVE Final   Influenza B by PCR NEGATIVE NEGATIVE Final    Comment: (NOTE) The Xpert Xpress SARS-CoV-2/FLU/RSV plus assay is intended as an aid in the diagnosis of influenza from Nasopharyngeal swab specimens and should not be used as a sole basis for treatment. Nasal washings and aspirates are unacceptable for  Xpert Xpress SARS-CoV-2/FLU/RSV testing.  Fact Sheet for Patients: EntrepreneurPulse.com.au  Fact Sheet for Healthcare Providers: IncredibleEmployment.be  This test is not yet approved or cleared by the [place] FDA and has been authorized for detection and/or diagnosis of SARS-CoV-2 by FDA under an Emergency Use Authorization (EUA). This EUA will remain in effect (meaning this test can be used) for the duration of the COVID-19 declaration under Section 564(b)(1) of the Act, 21 U.S.C. section 360bbb-3(b)(1), unless the authorization is terminated or revoked.  Performed at [facility], [place] [address], [place], [place] [address]   Culture, blood (routine x 2)     Status: None   Collection Time: 10/11/2020  2:13 PM   Specimen: BLOOD RIGHT FOREARM  Result Value Ref Range Status   Specimen Description BLOOD RIGHT FOREARM  Final   Special Requests   Final    BOTTLES DRAWN AEROBIC AND ANAEROBIC Blood Culture results may not be optimal due to an inadequate volume of blood received in culture bottles   Culture   Final    NO GROWTH 5 DAYS Performed at [facility], [place] [address], [place], [place] [address]    Report Status 10/11/2020 FINAL  Final  Culture, blood (routine x 2)     Status: None   Collection Time: 10/09/2020  6:44 PM   Specimen: BLOOD  Result Value Ref Range Status   Specimen Description BLOOD SITE NOT SPECIFIED  Final   Special Requests   Final    BOTTLES DRAWN AEROBIC AND ANAEROBIC Blood Culture adequate volume   Culture   Final    NO GROWTH 5 DAYS Performed at [facility], [address] [place], [place] [address]    Report Status 10/12/2020 FINAL  Final    Lab Basic Metabolic Panel: Recent Labs  Lab 10/07/20 0211 10/08/20 [id number] 10/09/20 0816 10/10/20 0429 10/11/20 0834 10/12/20 0354 11-02-20 0356  NA 144   < > 147* 147* 152* 151* 152*  K 3.5   < > 3.5 4.1 3.6 3.6 4.1  CL 107   < > 112* 118*  119* 117* 117*  CO2 28   < > '29 23 28 26 31  '$ GLUCOSE 127*   < > 154* 132* 139* 171* 209*  BUN 33*   < > 39* 37* 29* 33* 36*  CREATININE 1.65*   < > 1.80* 1.56* 1.62* 1.70* 1.87*  CALCIUM 8.0*   < > 7.9* 7.6* 8.1* 8.3* 8.2*  MG 2.1  --   --   --   --   --   --    < > = values in this interval not displayed.   Liver Function Tests: Recent Labs  Lab 10/09/20 0816  AST 19  ALT 11  ALKPHOS 43  BILITOT 0.8  PROT 5.7*  ALBUMIN 2.1*   No results for input(s): LIPASE, AMYLASE in the last 168 hours. No results for input(s): AMMONIA in the last 168 hours. CBC: Recent Labs  Lab 10/07/20 0211 10/08/20 0816 10/09/20 0816 10/11/20 0834 10/12/20 0354 November 02, 2020 0356  WBC 3.6* 4.7 4.1 5.4 5.3 6.7  NEUTROABS 2.9  --   --   --   --   --   HGB 11.4* 12.9* 11.4* 11.3* 11.1* 11.4*  HCT 38.8* 43.2 38.1* 39.7 38.9* 40.8  MCV 101.3* 99.1 98.7 102.3* 104.3* 105.7*  PLT 80* 89* 83* 91* 91* 88*   Cardiac Enzymes: No results for input(s): CKTOTAL, CKMB, CKMBINDEX, TROPONINI in the last 168 hours. Sepsis Labs: Recent Labs  Lab 10/10/2020 1844 10/07/20 0211 10/09/20 0816 10/11/20 0834 10/12/20 0354 11-02-2020 0356  PROCALCITON 0.28  --   --   --   --   --   WBC  --    < > 4.1 5.4 5.3 6.7  LATICACIDVEN 1.1  --   --   --   --   --    < > = values in this interval not displayed.    Procedures/Operations     Robert Mcconnell 11/02/20, 5:15 PM

## 2020-11-05 NOTE — Progress Notes (Signed)
Inpatient Diabetes Program Recommendations  AACE/ADA: New Consensus Statement on Inpatient Glycemic Control (2015)  Target Ranges:  Prepandial:   less than 140 mg/dL      Peak postprandial:   less than 180 mg/dL (1-2 hours)      Critically ill patients:  140 - 180 mg/dL   Lab Results  Component Value Date   GLUCAP 268 (H) 10-19-20    Review of Glycemic Control Results for DAEKWON, Robert Mcconnell (MRN [id number]) as of 2020/10/19 09:37  Ref. Range 10/12/2020 15:52 10/12/2020 20:58 Oct 19, 2020 00:28 Oct 19, 2020 04:10 10-19-20 08:22  Glucose-Capillary Latest Ref Range: 70 - 99 mg/dL 177 (H) 169 (H) 186 (H) 161 (H) 268 (H)   Diabetes history: None noted  Current orders for Inpatient glycemic control: Osmolite 1.5 @ 65 ml/hr via core track  Inpatient Diabetes Program Recommendations:    If CBG's > 180 mg/dL, might consider:  Novolog 0-6 units Q6H.  Will continue to follow while inpatient.  Thank you, Reche Dixon, RN, BSN Diabetes Coordinator Inpatient Diabetes Program [phone] (team pager from 8a-5p)

## 2020-11-05 DEATH — deceased

## 2020-11-10 ENCOUNTER — Ambulatory Visit: Payer: Non-veteran care

## 2020-11-17 ENCOUNTER — Ambulatory Visit: Payer: Non-veteran care

## 2020-11-24 ENCOUNTER — Ambulatory Visit: Payer: Non-veteran care

## 2021-08-06 IMAGING — CT CT HEAD W/O CM
3 series · 16 of 47 positions shown, 19 images · non-contrast
Comparison: None.

CLINICAL DATA: Fever and UTI.  Encephalopathy

EXAM:
CT HEAD WITHOUT CONTRAST
TECHNIQUE: Contiguous axial images were obtained from the base of the skull
through the vertex without intravenous contrast.

[Series 3: head 5.0 h30s · axial · 0.45mm/px · z∈[-95,+45]mm · 10 of 34 slices shown, 13 images]
[im 3/34  brain]
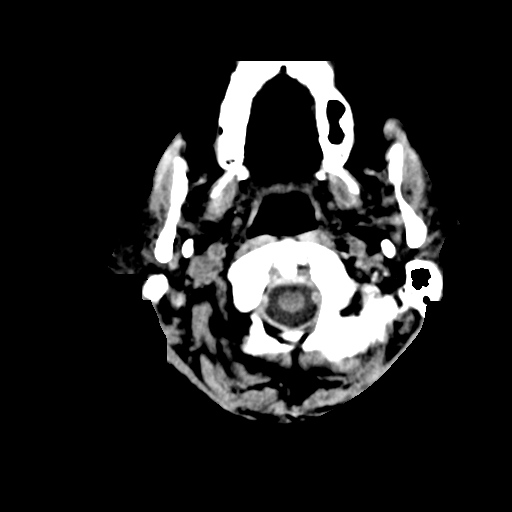
[im 3/34  bone]
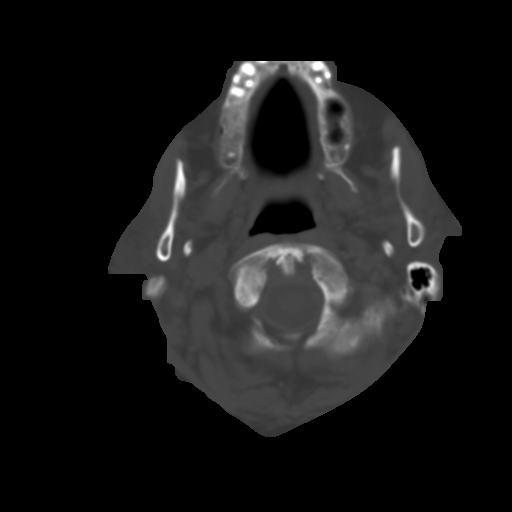
[im 6/34  brain]
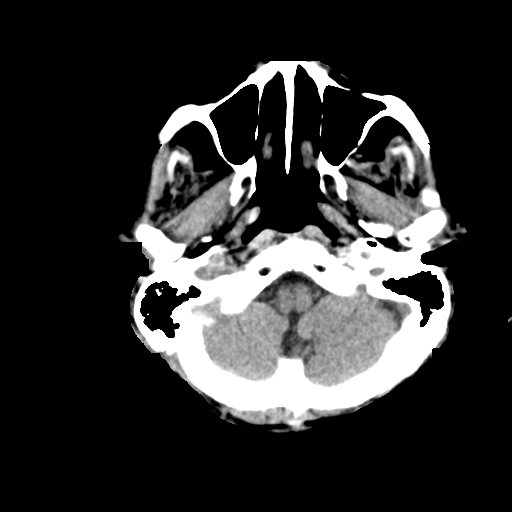
[im 10/34  brain]
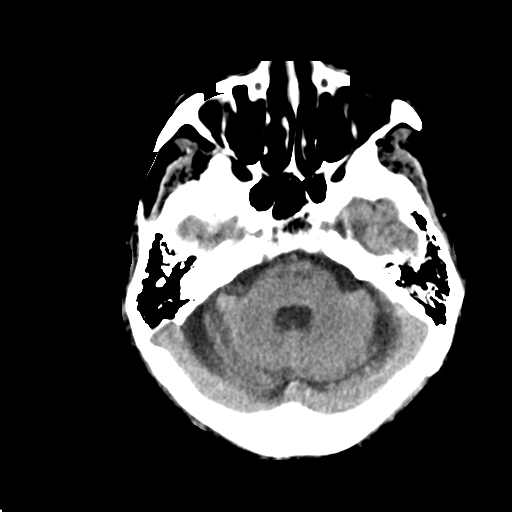
[im 12/34  brain]
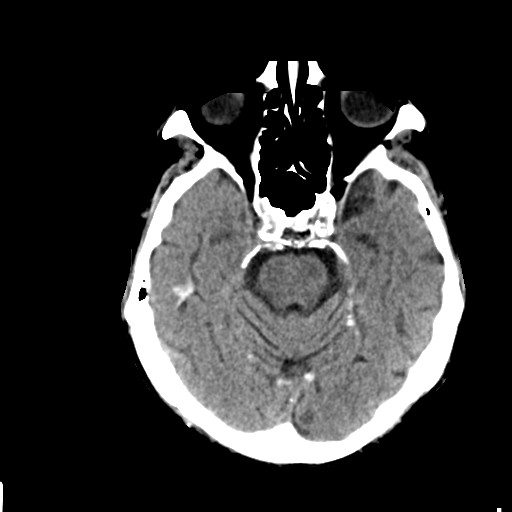
[im 15/34  brain]
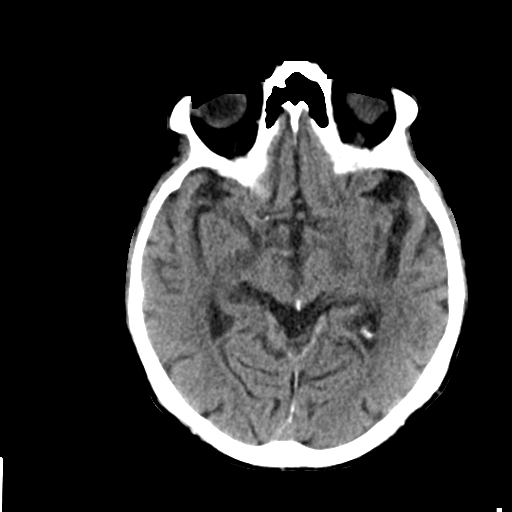
[im 15/34  bone]
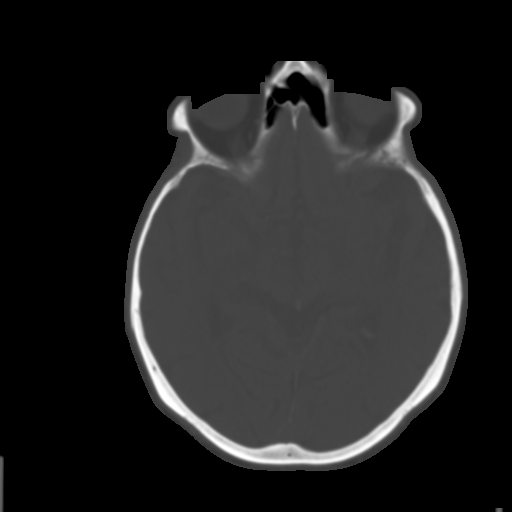
[im 19/34  brain]
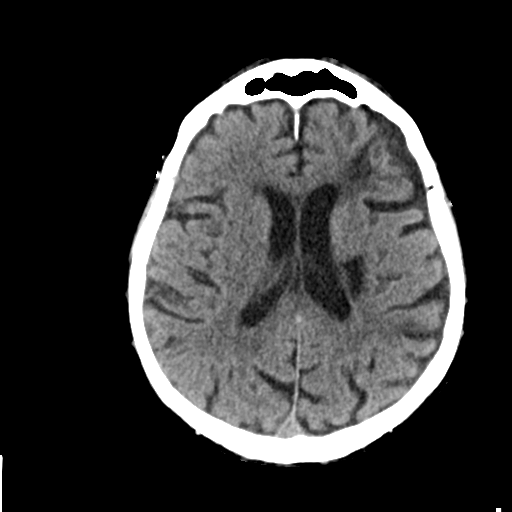
[im 22/34  brain]
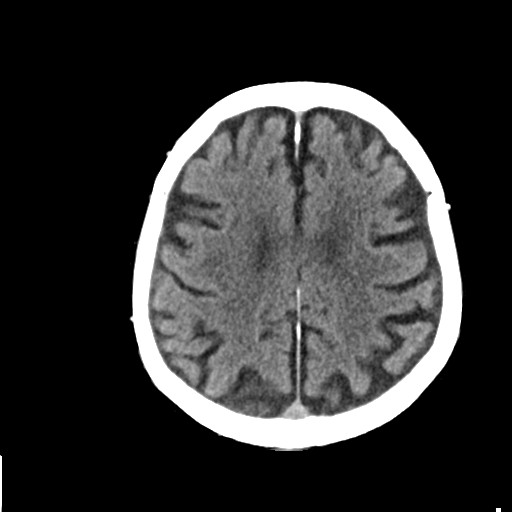
[im 26/34  brain]
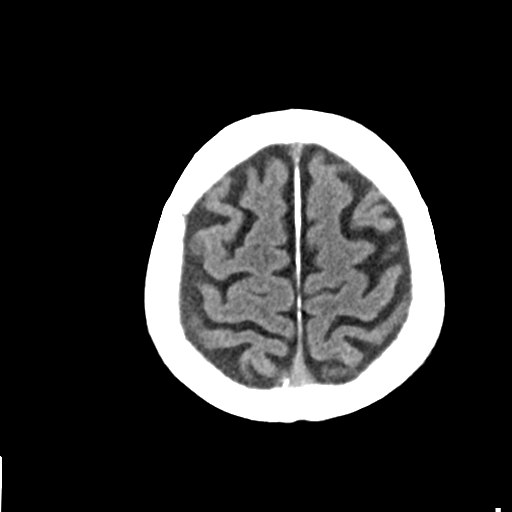
[im 28/34  brain]
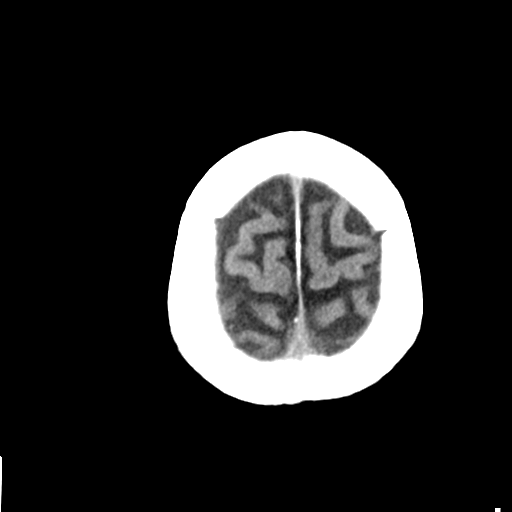
[im 28/34  bone]
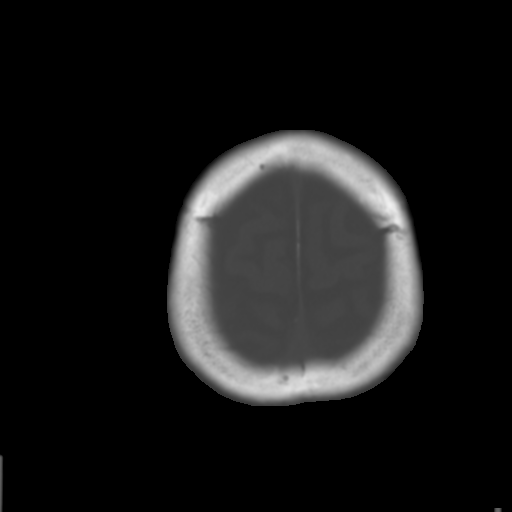
[im 31/34  brain]
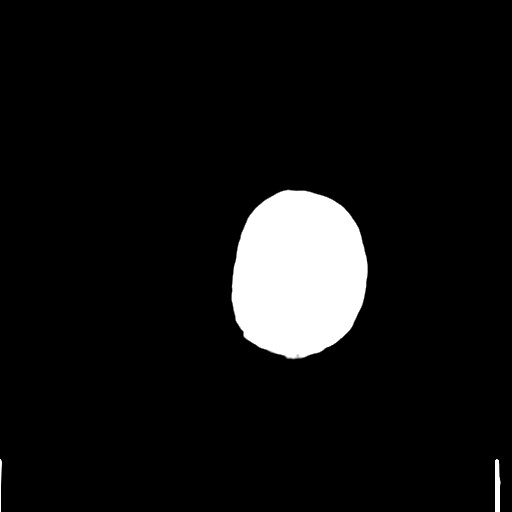

[Series 5: head 3.0 mpr cor · coronal · 0.33mm/px · 3 of 70 slices shown]
[im 24/70  brain]
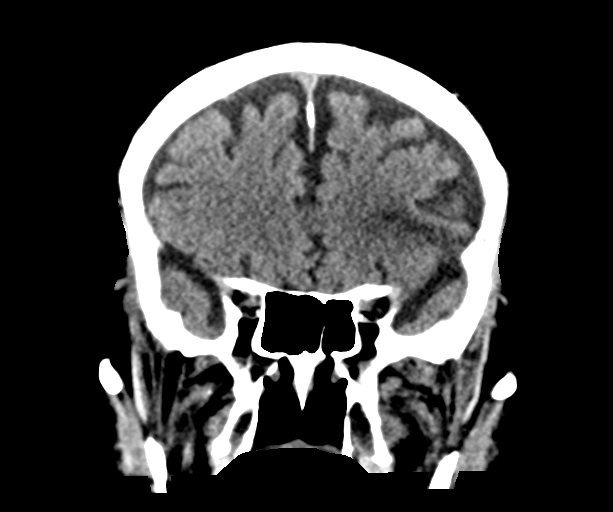
[im 31/70  brain]
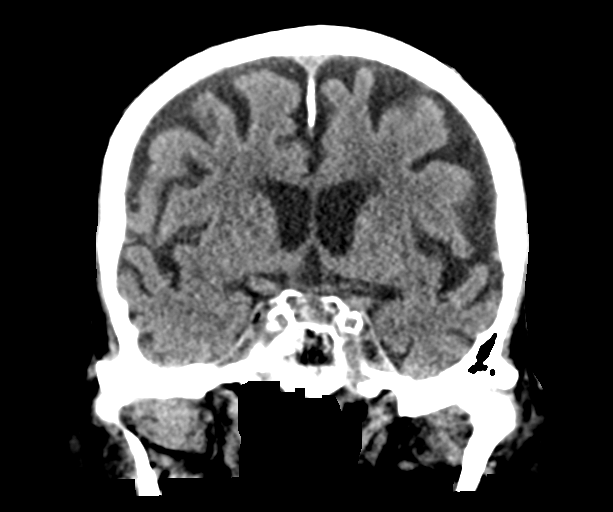
[im 39/70  brain]
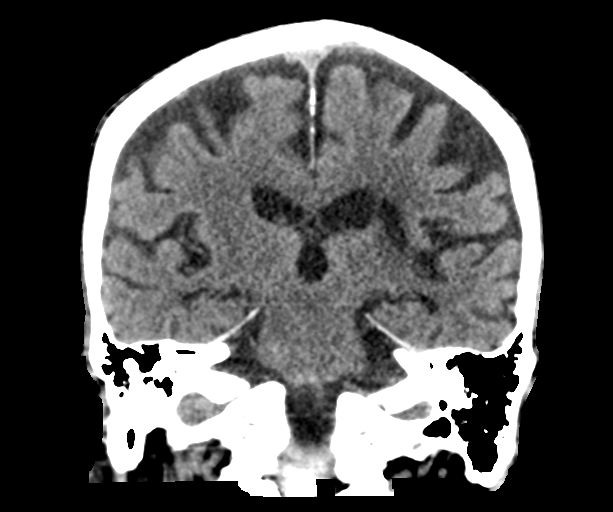

[Series 6: head 3.0 mpr sag · sagittal · 0.35mm/px · 3 of 65 slices shown]
[im 22/65  brain]
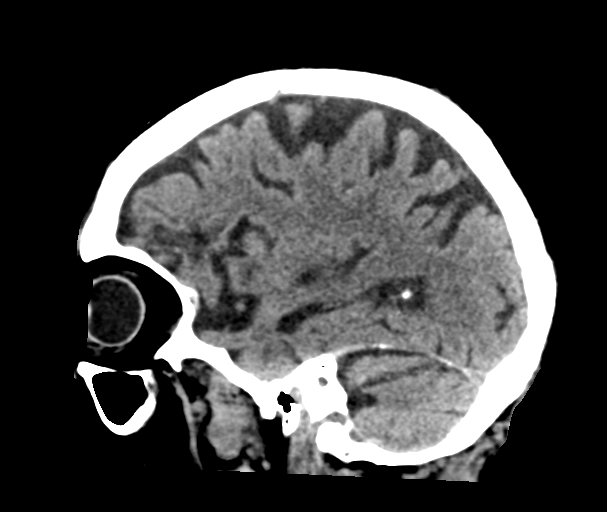
[im 33/65  brain]
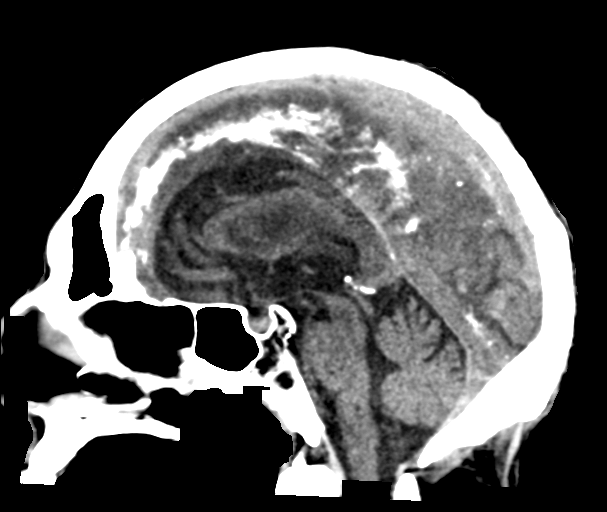
[im 43/65  brain]
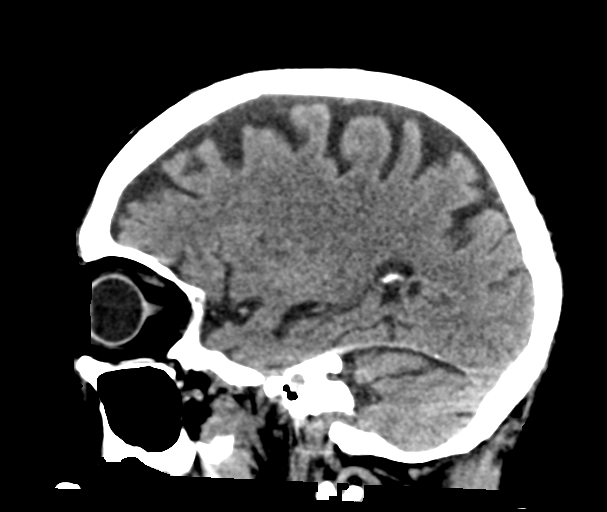

[16 of 47 positions shown; findings below may reference images not displayed]

FINDINGS: Brain: Remote left MCA branch infarct affecting the frontal
operculum and insula. Remote perforator infarct at the left basal
ganglia and corona radiata. Mild for age chronic small vessel
ischemia. No evidence of acute infarct, hemorrhage, hydrocephalus,
or mass.

Vascular: No hyperdense vessel or unexpected calcification.

Skull: Normal. Negative for fracture or focal lesion.

Sinuses/Orbits: No acute finding.
IMPRESSION: 1. No acute or reversible finding.
2. Remote left MCA branch infarcts.

## 2021-08-06 IMAGING — US US RENAL
1 series · 14 of 25 positions shown · non-contrast
Comparison: None.

CLINICAL DATA: Sepsis.

EXAM:
RENAL / URINARY TRACT ULTRASOUND COMPLETE

[Series 1: us renal · 14 of 46 slices shown]
[im 1/46]
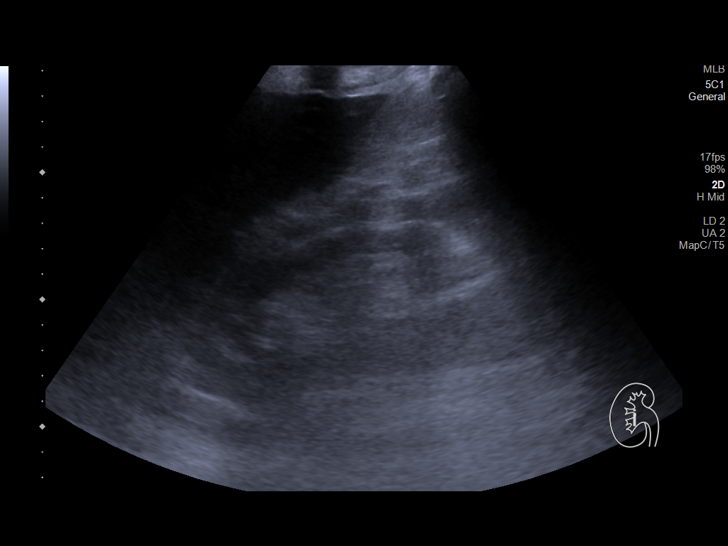
[im 4/46]
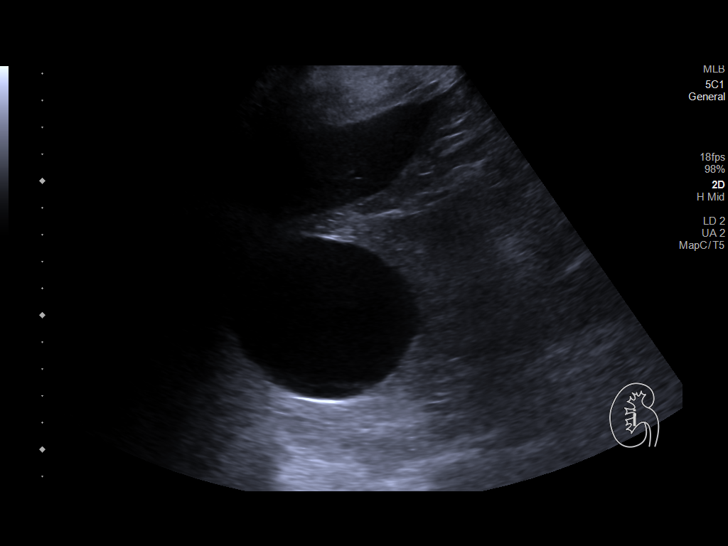
[im 8/46]
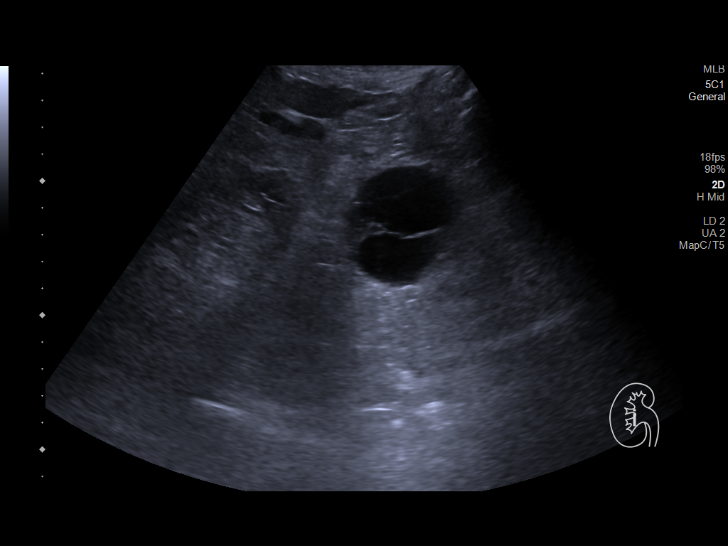
[im 12/46]
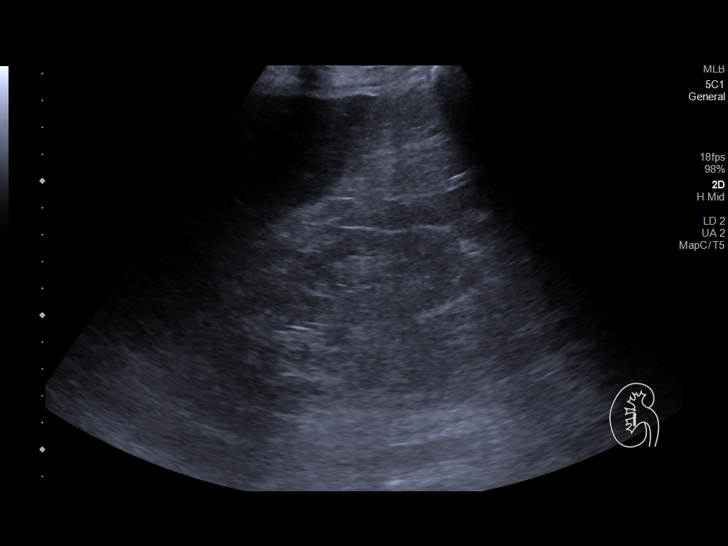
[im 16/46]
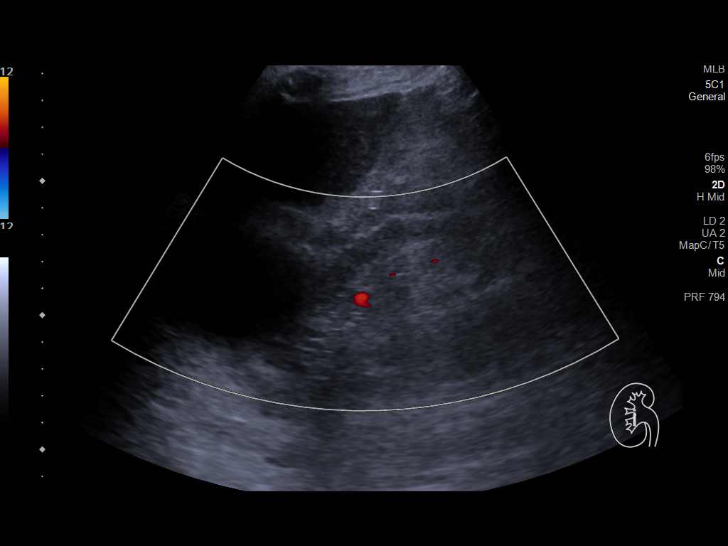
[im 17/46]
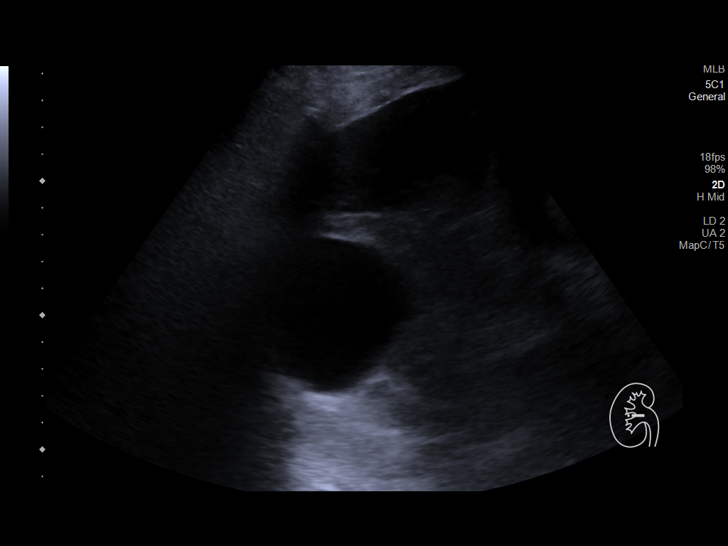
[im 21/46]
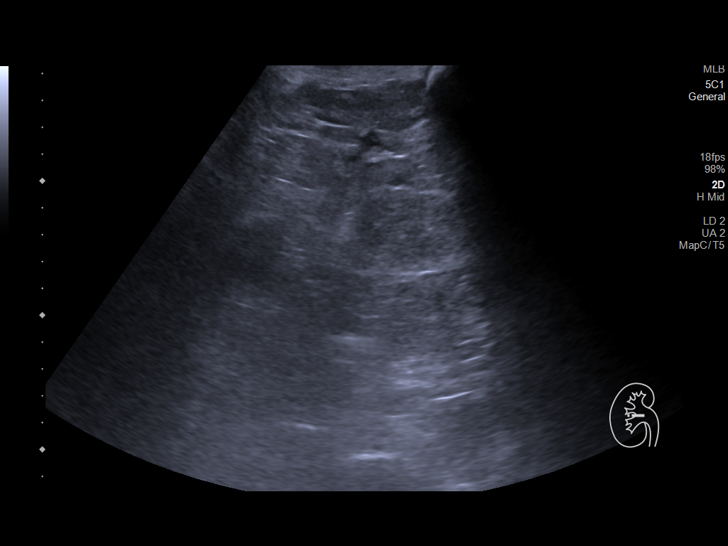
[im 25/46]
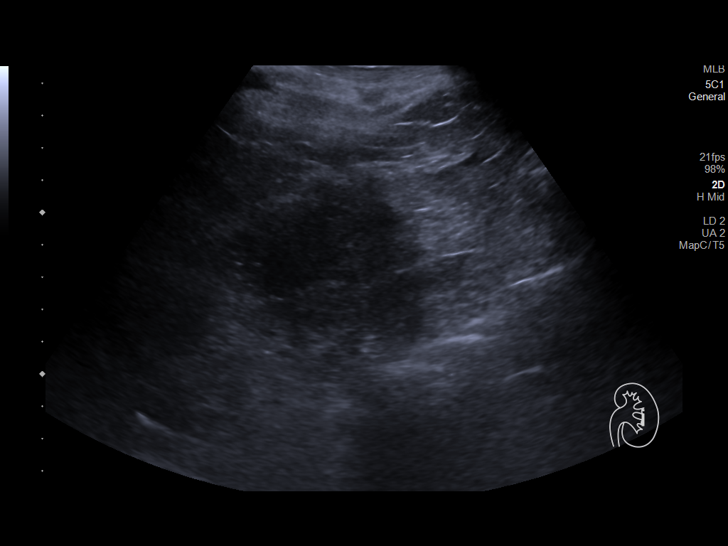
[im 29/46]
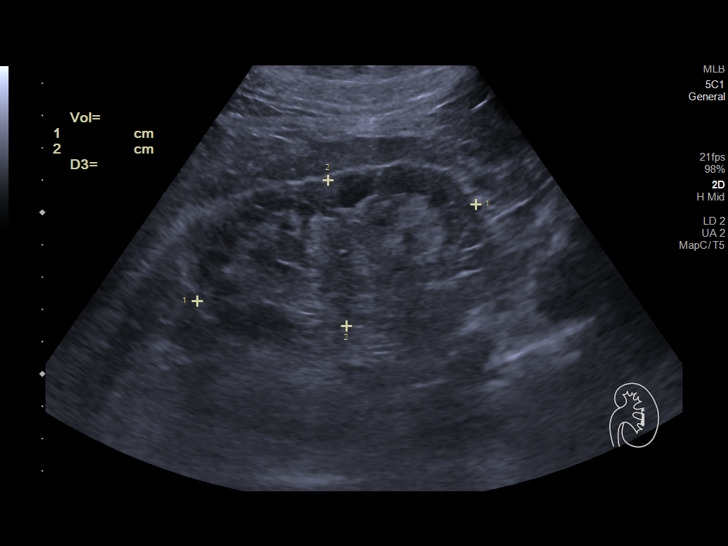
[im 31/46]
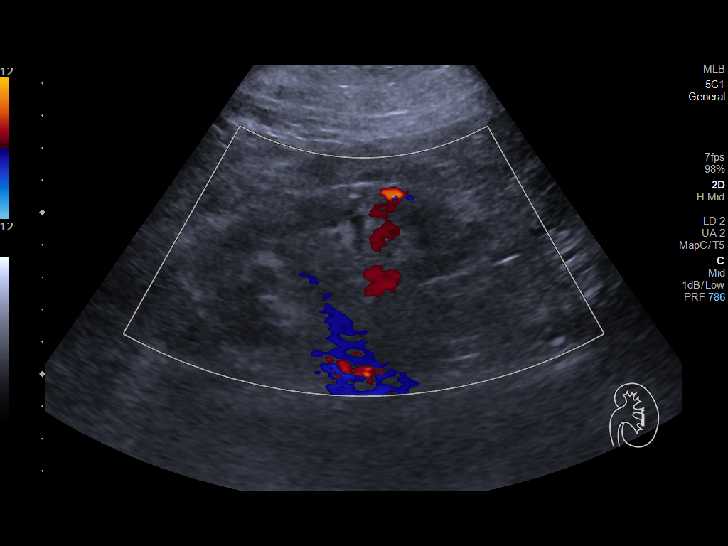
[im 34/46]
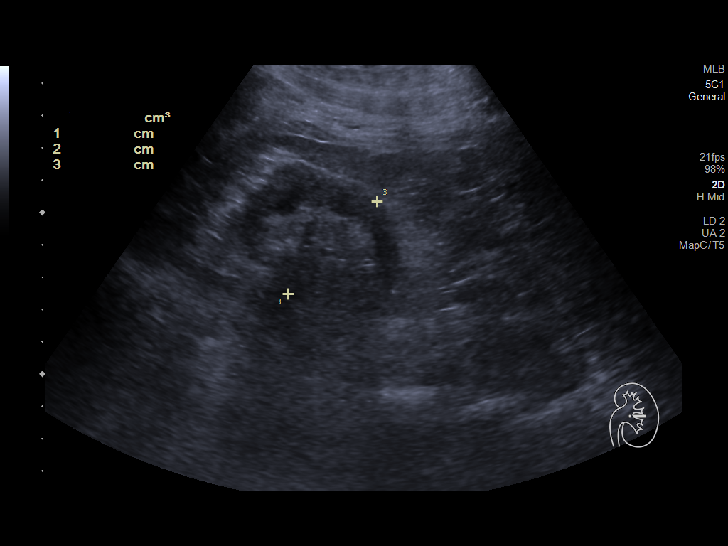
[im 38/46]
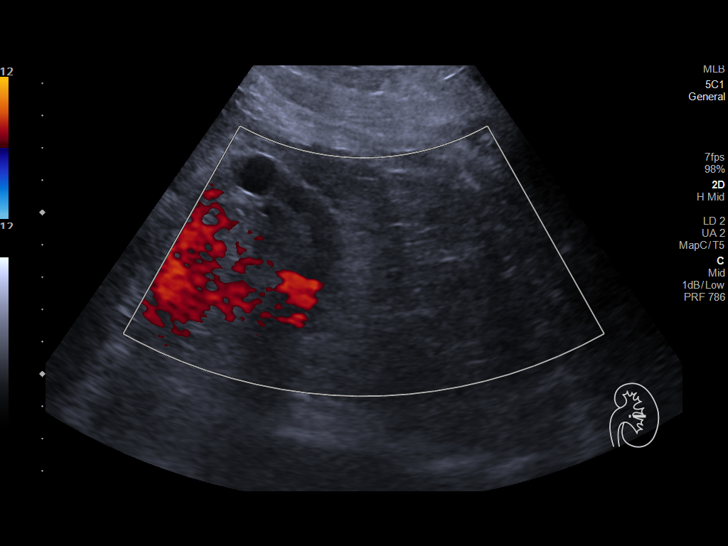
[im 42/46]
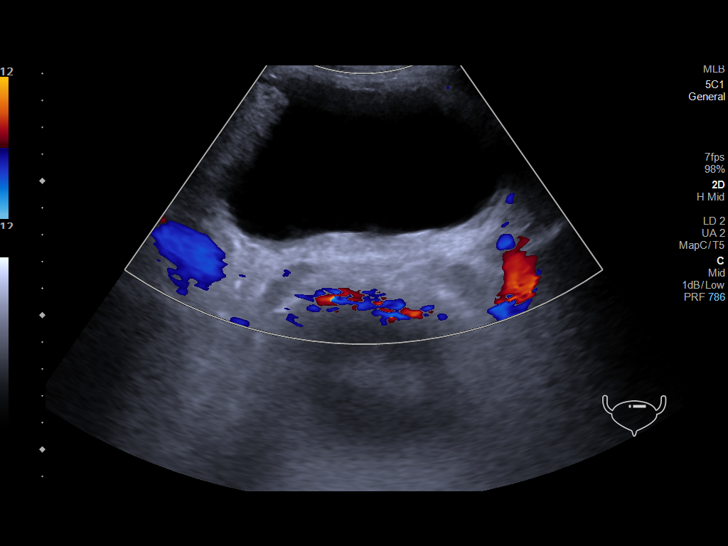
[im 46/46]
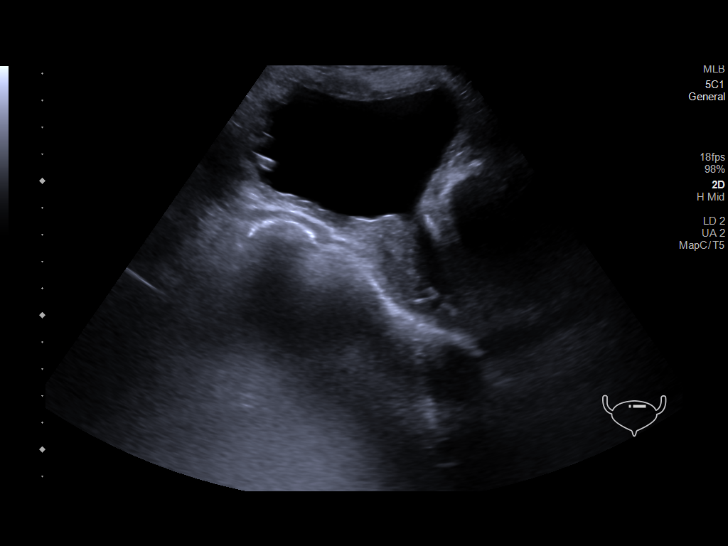

[14 of 25 positions shown; findings below may reference images not displayed]

FINDINGS: Right Kidney:

Renal measurements: 10.6 x 5.0 x 4.1 cm = volume: 112.9 mL. Renal
cortical thinning and increased echogenicity suggesting medical
renal disease.

Simple appearing 7 cm cyst projecting off the upper pole region of
the right kidney.

Septated cyst measuring 4.5 x 4.5 x 4.8 cm projecting off the
inferior and medial aspect of the right kidney.

No hydronephrosis.

Left Kidney:

Renal measurements: 9.1 x 4.6 x 4.0 cm = volume: 86.4 mL. Renal
cortical thinning and increased echogenicity suggesting medical
renal disease.

1.3 cm simple appearing cyst.

No hydronephrosis.

Bladder:

The bladder demonstrates mild trabeculation and may be due to mild
bladder outlet obstruction from a slightly enlarged prostate gland.
No bladder mass or calculi.

Other:

None.
IMPRESSION: 1. Bilateral renal cysts.
2. Renal cortical thinning and increased echogenicity suggesting
medical renal disease.
3. Mild prostate gland enlargement and mild trabeculation of the
bladder.
